# Patient Record
Sex: Female | Born: 1935 | Race: White | Hispanic: No | Marital: Single | Smoking: Former smoker
Health system: Southern US, Community
[De-identification: ages and names within clinical notes are randomized; demographics above are authoritative.]

## PROBLEM LIST (undated history)

## (undated) DIAGNOSIS — K219 Gastro-esophageal reflux disease without esophagitis: Secondary | ICD-10-CM

## (undated) DIAGNOSIS — G459 Transient cerebral ischemic attack, unspecified: Secondary | ICD-10-CM

## (undated) DIAGNOSIS — I10 Essential (primary) hypertension: Secondary | ICD-10-CM

## (undated) DIAGNOSIS — E559 Vitamin D deficiency, unspecified: Secondary | ICD-10-CM

## (undated) DIAGNOSIS — F0280 Dementia in other diseases classified elsewhere without behavioral disturbance: Secondary | ICD-10-CM

## (undated) DIAGNOSIS — K222 Esophageal obstruction: Secondary | ICD-10-CM

## (undated) DIAGNOSIS — M8580 Other specified disorders of bone density and structure, unspecified site: Secondary | ICD-10-CM

## (undated) DIAGNOSIS — J309 Allergic rhinitis, unspecified: Secondary | ICD-10-CM

## (undated) DIAGNOSIS — Z800 Family history of malignant neoplasm of digestive organs: Secondary | ICD-10-CM

## (undated) DIAGNOSIS — E785 Hyperlipidemia, unspecified: Secondary | ICD-10-CM

## (undated) DIAGNOSIS — K449 Diaphragmatic hernia without obstruction or gangrene: Secondary | ICD-10-CM

## (undated) DIAGNOSIS — G309 Alzheimer's disease, unspecified: Secondary | ICD-10-CM

## (undated) DIAGNOSIS — R9389 Abnormal findings on diagnostic imaging of other specified body structures: Secondary | ICD-10-CM

## (undated) DIAGNOSIS — J189 Pneumonia, unspecified organism: Secondary | ICD-10-CM

## (undated) HISTORY — DX: Vitamin D deficiency, unspecified: E55.9

## (undated) HISTORY — DX: Diaphragmatic hernia without obstruction or gangrene: K44.9

## (undated) HISTORY — DX: Other specified disorders of bone density and structure, unspecified site: M85.80

## (undated) HISTORY — DX: Hyperlipidemia, unspecified: E78.5

## (undated) HISTORY — DX: Alzheimer's disease, unspecified: G30.9

## (undated) HISTORY — DX: Abnormal findings on diagnostic imaging of other specified body structures: R93.89

## (undated) HISTORY — DX: Transient cerebral ischemic attack, unspecified: G45.9

## (undated) HISTORY — DX: Allergic rhinitis, unspecified: J30.9

## (undated) HISTORY — DX: Dementia in other diseases classified elsewhere, unspecified severity, without behavioral disturbance, psychotic disturbance, mood disturbance, and anxiety: F02.80

## (undated) HISTORY — DX: Esophageal obstruction: K22.2

## (undated) HISTORY — PX: TONSILLECTOMY: SUR1361

## (undated) HISTORY — PX: THORACOTOMY / DECORTICATION PARIETAL PLEURA: SUR1350

## (undated) HISTORY — DX: Family history of malignant neoplasm of digestive organs: Z80.0

---

## 1978-04-01 HISTORY — PX: ABDOMINAL HYSTERECTOMY: SHX81

## 1997-11-22 ENCOUNTER — Other Ambulatory Visit: Admission: RE | Admit: 1997-11-22 | Discharge: 1997-11-22 | Payer: Self-pay | Admitting: Obstetrics and Gynecology

## 1999-01-30 ENCOUNTER — Other Ambulatory Visit: Admission: RE | Admit: 1999-01-30 | Discharge: 1999-01-30 | Payer: Self-pay | Admitting: Obstetrics and Gynecology

## 1999-03-07 ENCOUNTER — Encounter: Payer: Self-pay | Admitting: Obstetrics and Gynecology

## 1999-03-07 ENCOUNTER — Encounter: Admission: RE | Admit: 1999-03-07 | Discharge: 1999-03-07 | Payer: Self-pay | Admitting: Obstetrics and Gynecology

## 2000-02-04 ENCOUNTER — Other Ambulatory Visit: Admission: RE | Admit: 2000-02-04 | Discharge: 2000-02-04 | Payer: Self-pay | Admitting: Obstetrics and Gynecology

## 2000-03-07 ENCOUNTER — Encounter: Admission: RE | Admit: 2000-03-07 | Discharge: 2000-03-07 | Payer: Self-pay | Admitting: Obstetrics and Gynecology

## 2000-03-07 ENCOUNTER — Encounter: Payer: Self-pay | Admitting: Obstetrics and Gynecology

## 2001-02-09 ENCOUNTER — Other Ambulatory Visit: Admission: RE | Admit: 2001-02-09 | Discharge: 2001-02-09 | Payer: Self-pay | Admitting: Obstetrics and Gynecology

## 2001-02-12 ENCOUNTER — Ambulatory Visit (HOSPITAL_COMMUNITY): Admission: RE | Admit: 2001-02-12 | Discharge: 2001-02-12 | Payer: Self-pay | Admitting: Gastroenterology

## 2001-02-12 ENCOUNTER — Encounter (INDEPENDENT_AMBULATORY_CARE_PROVIDER_SITE_OTHER): Payer: Self-pay | Admitting: Specialist

## 2001-03-12 ENCOUNTER — Encounter: Admission: RE | Admit: 2001-03-12 | Discharge: 2001-03-12 | Payer: Self-pay | Admitting: Obstetrics and Gynecology

## 2001-03-12 ENCOUNTER — Encounter: Payer: Self-pay | Admitting: Geriatric Medicine

## 2001-08-06 ENCOUNTER — Ambulatory Visit (HOSPITAL_COMMUNITY): Admission: RE | Admit: 2001-08-06 | Discharge: 2001-08-06 | Payer: Self-pay | Admitting: Gastroenterology

## 2001-08-06 ENCOUNTER — Encounter (INDEPENDENT_AMBULATORY_CARE_PROVIDER_SITE_OTHER): Payer: Self-pay | Admitting: Specialist

## 2001-10-26 ENCOUNTER — Emergency Department (HOSPITAL_COMMUNITY): Admission: EM | Admit: 2001-10-26 | Discharge: 2001-10-26 | Payer: Self-pay

## 2002-03-15 ENCOUNTER — Encounter: Payer: Self-pay | Admitting: Obstetrics and Gynecology

## 2002-03-15 ENCOUNTER — Encounter: Admission: RE | Admit: 2002-03-15 | Discharge: 2002-03-15 | Payer: Self-pay | Admitting: Obstetrics and Gynecology

## 2002-08-12 ENCOUNTER — Inpatient Hospital Stay (HOSPITAL_COMMUNITY): Admission: AD | Admit: 2002-08-12 | Discharge: 2002-08-23 | Payer: Self-pay | Admitting: Internal Medicine

## 2002-08-13 ENCOUNTER — Encounter: Payer: Self-pay | Admitting: *Deleted

## 2002-08-13 ENCOUNTER — Encounter: Payer: Self-pay | Admitting: Internal Medicine

## 2002-08-14 ENCOUNTER — Encounter: Payer: Self-pay | Admitting: *Deleted

## 2002-08-14 ENCOUNTER — Encounter (INDEPENDENT_AMBULATORY_CARE_PROVIDER_SITE_OTHER): Payer: Self-pay | Admitting: *Deleted

## 2002-08-14 ENCOUNTER — Encounter: Payer: Self-pay | Admitting: Internal Medicine

## 2002-08-15 ENCOUNTER — Encounter: Payer: Self-pay | Admitting: Thoracic Surgery (Cardiothoracic Vascular Surgery)

## 2002-08-15 ENCOUNTER — Encounter (INDEPENDENT_AMBULATORY_CARE_PROVIDER_SITE_OTHER): Payer: Self-pay | Admitting: *Deleted

## 2002-08-16 ENCOUNTER — Encounter (INDEPENDENT_AMBULATORY_CARE_PROVIDER_SITE_OTHER): Payer: Self-pay | Admitting: *Deleted

## 2002-08-16 ENCOUNTER — Encounter: Payer: Self-pay | Admitting: Thoracic Surgery (Cardiothoracic Vascular Surgery)

## 2002-08-17 ENCOUNTER — Encounter: Payer: Self-pay | Admitting: Thoracic Surgery (Cardiothoracic Vascular Surgery)

## 2002-08-18 ENCOUNTER — Encounter: Payer: Self-pay | Admitting: Thoracic Surgery (Cardiothoracic Vascular Surgery)

## 2002-08-19 ENCOUNTER — Encounter: Payer: Self-pay | Admitting: *Deleted

## 2002-08-19 ENCOUNTER — Encounter: Payer: Self-pay | Admitting: Thoracic Surgery (Cardiothoracic Vascular Surgery)

## 2002-08-20 ENCOUNTER — Encounter: Payer: Self-pay | Admitting: *Deleted

## 2002-09-13 ENCOUNTER — Encounter: Payer: Self-pay | Admitting: Thoracic Surgery (Cardiothoracic Vascular Surgery)

## 2002-09-13 ENCOUNTER — Encounter
Admission: RE | Admit: 2002-09-13 | Discharge: 2002-09-13 | Payer: Self-pay | Admitting: Thoracic Surgery (Cardiothoracic Vascular Surgery)

## 2002-11-25 ENCOUNTER — Ambulatory Visit (HOSPITAL_COMMUNITY): Admission: RE | Admit: 2002-11-25 | Discharge: 2002-11-25 | Payer: Self-pay | Admitting: Gastroenterology

## 2002-12-13 ENCOUNTER — Encounter
Admission: RE | Admit: 2002-12-13 | Discharge: 2002-12-13 | Payer: Self-pay | Admitting: Thoracic Surgery (Cardiothoracic Vascular Surgery)

## 2002-12-13 ENCOUNTER — Encounter: Payer: Self-pay | Admitting: Thoracic Surgery (Cardiothoracic Vascular Surgery)

## 2003-03-17 ENCOUNTER — Encounter: Admission: RE | Admit: 2003-03-17 | Discharge: 2003-03-17 | Payer: Self-pay | Admitting: Geriatric Medicine

## 2004-07-05 ENCOUNTER — Encounter: Admission: RE | Admit: 2004-07-05 | Discharge: 2004-07-05 | Payer: Self-pay | Admitting: Geriatric Medicine

## 2004-09-05 ENCOUNTER — Ambulatory Visit (HOSPITAL_COMMUNITY): Admission: RE | Admit: 2004-09-05 | Discharge: 2004-09-05 | Payer: Self-pay | Admitting: Gastroenterology

## 2005-07-10 ENCOUNTER — Encounter: Admission: RE | Admit: 2005-07-10 | Discharge: 2005-07-10 | Payer: Self-pay | Admitting: Geriatric Medicine

## 2006-07-08 ENCOUNTER — Encounter: Admission: RE | Admit: 2006-07-08 | Discharge: 2006-07-08 | Payer: Self-pay | Admitting: Allergy and Immunology

## 2006-07-18 ENCOUNTER — Encounter: Admission: RE | Admit: 2006-07-18 | Discharge: 2006-07-18 | Payer: Self-pay | Admitting: Geriatric Medicine

## 2007-07-20 ENCOUNTER — Encounter: Admission: RE | Admit: 2007-07-20 | Discharge: 2007-07-20 | Payer: Self-pay | Admitting: Geriatric Medicine

## 2007-07-24 ENCOUNTER — Emergency Department (HOSPITAL_COMMUNITY): Admission: EM | Admit: 2007-07-24 | Discharge: 2007-07-24 | Payer: Self-pay | Admitting: Emergency Medicine

## 2008-07-20 ENCOUNTER — Encounter: Admission: RE | Admit: 2008-07-20 | Discharge: 2008-07-20 | Payer: Self-pay | Admitting: Geriatric Medicine

## 2008-08-23 ENCOUNTER — Encounter: Admission: RE | Admit: 2008-08-23 | Discharge: 2008-08-23 | Payer: Self-pay | Admitting: Allergy and Immunology

## 2009-07-21 ENCOUNTER — Encounter: Admission: RE | Admit: 2009-07-21 | Discharge: 2009-07-21 | Payer: Self-pay | Admitting: Geriatric Medicine

## 2009-11-11 ENCOUNTER — Emergency Department (HOSPITAL_COMMUNITY): Admission: EM | Admit: 2009-11-11 | Discharge: 2009-11-11 | Payer: Self-pay | Admitting: Emergency Medicine

## 2009-11-30 DIAGNOSIS — R9389 Abnormal findings on diagnostic imaging of other specified body structures: Secondary | ICD-10-CM

## 2009-11-30 HISTORY — DX: Abnormal findings on diagnostic imaging of other specified body structures: R93.89

## 2009-12-06 ENCOUNTER — Encounter: Admission: RE | Admit: 2009-12-06 | Discharge: 2009-12-06 | Payer: Self-pay | Admitting: Geriatric Medicine

## 2010-07-05 ENCOUNTER — Other Ambulatory Visit: Payer: Self-pay | Admitting: Geriatric Medicine

## 2010-07-05 DIAGNOSIS — Z1231 Encounter for screening mammogram for malignant neoplasm of breast: Secondary | ICD-10-CM

## 2010-07-23 ENCOUNTER — Ambulatory Visit
Admission: RE | Admit: 2010-07-23 | Discharge: 2010-07-23 | Disposition: A | Discharge status: Home / Self Care | Payer: Medicare Other | Source: Ambulatory Visit | Attending: Geriatric Medicine | Admitting: Geriatric Medicine

## 2010-07-23 DIAGNOSIS — Z1231 Encounter for screening mammogram for malignant neoplasm of breast: Secondary | ICD-10-CM

## 2010-08-17 NOTE — Op Note (Signed)
NAME:  Grace Gilbert, Grace Gilbert                         ACCOUNT NO.:  [id number]   MEDICAL RECORD NO.:  [id number]                   PATIENT TYPE:  INP   LOCATION:  2550                                 FACILITY:  [facility]   PHYSICIAN:  Salvatore Decent. [name], M.D.              DATE OF BIRTH:  11-16-1932   DATE OF PROCEDURE:  08/15/2002  DATE OF DISCHARGE:                                 OPERATIVE REPORT   PREOPERATIVE DIAGNOSIS:  Subacute or chronic empyema.   POSTOPERATIVE DIAGNOSIS:  Subacute or chronic empyema.   PROCEDURE:  1. Flexible bronchoscopy.  2. Right thoracotomy for decortication and drainage of empyema.   SURGEON:  Salvatore Decent. [name], M.D.   ASSISTANT:  Eber Hong, P.A.   ANESTHESIA:  General.   BRIEF CLINICAL NOTE:  The patient is a 77 year old female who presents with  a prolonged history of recurrent respiratory infections and fevers.  Chest x-  ray and chest CT scan demonstrate findings consistent with subacute or  chronic empyema.  The patient remains febrile with elevated white blood  count, but is clinically stable.  A full consultation note has been dictated  previously.  Gastrografin and barium swallow demonstrate no sign of  esophageal leak, although there is evidence of likely benign lower  esophageal stricture.   OPERATIVE CONSENT:  The patient and her cousin have been counseled at length  regarding the indications and need for surgical intervention for definitive  drainage and treatment of her empyema.  Alternative treatment strategies  have been discussed.  The patient understands and accepts all associated  risks of surgery including, but not limited to, risks of death, stroke,  myocardial infarction, acute respiratory failure, pneumonia, bleeding  requiring blood transfusion, arrhythmia, prolonged air leak requiring chest  tube drainage, recurrent effusion.  All of their questions have been  addressed.   OPERATIVE NOTE IN DETAIL:  The patient is  brought to the operating room on  the above-mentioned date and placed in a supine position on the operating  room table.  A central venous catheter and radial arterial line are placed  by the anesthesia service under the care and direction of Dr. Kaylyn Layer.  Ossey.  The patient has already been receiving intravenous antibiotics and  her recent dose administration is verified.  General endotracheal anesthesia  is induced uneventfully using a single-lumen endotracheal tube.  A Foley  catheter is placed.  Pneumatic compression boots are placed on both lower  extremities to prevent deep vein thrombosis.   Flexible bronchoscopy is performed through the existing endobronchial tube.  The distal trachea, carina and left and right endobronchial trees are  visualized carefully using a 5-mm flexible bronchoscope.  There are copious  purulent secretions throughout all the airways.  The airways are  erythematous and swollen.  There is normal endobronchial anatomy.  No  endobronchial lesions or obstructions or masses are identified.  The  segmental bronchi  in the right lower lobe, right middle lobe and the right  upper lobe all appear somewhat collapsed, but there is no significant of  extrinsic mass compression or obstruction.  The airways are irrigated with  saline solution and bronchial washings are sent for culture and sensitivity.  Additional samples of bronchial washings are sent for cytology.  The  bronchoscope is removed uneventfully.   The patient is reintubated using a dual-lumen endotracheal tube by Dr.  Michelle Piper.  This was apparently technically difficult and required several  tries, although eventually a tube was positioned appropriately and the  patient tolerated it well.  The patient is turned to the left lateral  decubitus position and single-lung ventilation is begun.  The patient is  positioned using a pneumatic beanbag device and axillary roll.  A warm air  conduction blanket is placed  over the lower body to maintain body  temperature.  The patient's right chest is prepared and draped in a sterile  manner.   A small incision is made over the anterior axillary line overlying  approximately the 8th intercostal space.  The incision is completed through  the subcutaneous tissues and intercostal musculature with electrocautery.  The right pleural space is entered bluntly.  One can appreciate some murky  fluid as well as dense consolidation of the underlying lung and adhesions  and fibrous tissues surrounding the lung, therefore making video  thoracoscopic surgery impossible, as the lung will not collapse.   A right posterolateral thoracotomy incision is made.  The latissimus dorsi  muscle is divided with electrocautery.  The serratus anterior muscle is  preserved.  The right chest is entered through the sixth intercostal space.  There is evidence of subacute empyema with murky purulent fluid which is  aspirated and sent for culture and sensitivity.  The surface of the lung is  fairly easily mobilized from the chest wall circumferentially, as, although  there is dense subacute inflammation, the adhesions between the lung and  chest wall are not fibrotic.  The lung is mobilized circumferentially.  The  majority of the lung is atelectatic and severely consolidated.  There are  dense adhesions between the base of the right lower lobe and the dome of the  diaphragm which are transected using sharp dissection and electrocautery.  Pleural peel is debrided from the visceropleural surface over the entire  lung and specimens are sent to pathology for routine pathology.  There is an  area in the middle portion of the right lung which appears to be in the  right lower lobe that has appearance of a lung abscess which is perforated  to the pleural surface.  This could represent an area of abscess within the major fissure, although the major fissure is not well-developed at all in  this  region.  Swab cultures are obtained from this abscess cavity for  aerobic and anaerobic culture.  After her lung is completely mobilized and  all of the visceropleural fibrosis has been debrided, the right chest is  irrigated with copious warm saline solution containing multiple antibiotics.  The esophagus is carefully inspected and palpated and no masses are  appreciated.  There are no palpable masses within the lung parenchyma,  although the lung parenchyma is densely consolidated, making it difficult to  ascertain and definitively rule out the possibility of an underlying mass.  Satisfactory hemostasis is ascertained.  The right chest is drained using  three separate 36-French chest tube exited through separate stab incisions  inferiorly to  completely drain the pleural space.  The thoracotomy incision  is closed in multiple layers in routine fashion.  Skin incision is closed  with skin staples.  The chest tubes are affixed to closed suction drainage  device.   The patient tolerated the procedure well, was reintubated using a single-  lumen tube, and transported to the surgical intensive care unit in stable  condition.  There were no intraoperative complications.  All sponge,  instrument and needle counts are verified correct at completion of the  operation.  Estimated blood loss from the procedure is less than 100 mL.                                               Salvatore Decent. [name], M.D.    CHO/MEDQ  D:  08/15/2002  T:  08/16/2002  Job:  [id number]   cc:   Hal T. Stoneking, M.D.  [address]  [place], [place] [address]  Fax: [phone]   Ermalene Searing. Leander Rams, M.D.  [address], [place] [address]  Fax: [phone]   Candyce Churn, M.D.  [address] [place]  [place] [address]  Fax: [phone]

## 2010-08-17 NOTE — Op Note (Signed)
NAMESILVER, ACHEY NO.:  [id number]   MEDICAL RECORD NO.:  [id number]          PATIENT TYPE:  AMB   LOCATION:  ENDO                         FACILITY:  [facility]   PHYSICIAN:  Danise Edge, M.D.   DATE OF BIRTH:  25-Mar-1933   DATE OF PROCEDURE:  09/05/2004  DATE OF DISCHARGE:                                 OPERATIVE REPORT   PROCEDURE:  Surveillance colonoscopy   PROCEDURE INDICATIONS:  Ms. Grace Gilbert is a 77 year old female born  09-03-1932.  Three years ago she underwent her first screening  colonoscopy which resulted in the removal of a 1.5 cm tubulovillous adenoma  of the rectum. She is scheduled for surveillance colonoscopy with  polypectomy to prevent colon cancer.   ENDOSCOPIST:  Danise Edge, M.D.   PREMEDICATION:  Versed 5 milligrams, Demerol 50 milligrams.   PROCEDURE:  After obtaining informed consent, [name] was placed in the  left lateral decubitus position. I administered intravenous Demerol and  intravenous Versed to achieve conscious sedation for the procedure. The  patient's blood pressure, oxygen saturation and cardiac rhythm were  monitored throughout the procedure and documented in the medical record.   Anal inspection and digital rectal exam were normal. The Olympus adjustable  pediatric colonoscope was introduced into the rectum and easily advanced to  the cecum. Colonic preparation for the exam today was excellent.   Rectum normal. Retroflexed view of the distal rectum normal.  Sigmoid colon and descending colon normal.  Splenic flexure normal.  Transverse colon normal.  Hepatic flexure normal.  Ascending colon normal.  Cecum and ileocecal valve normal.   ASSESSMENT:  Normal proctocolonoscopy to the cecum.   RECOMMENDATIONS:  Repeat colonoscopy in June 2011.      MJ/MEDQ  D:  09/05/2004  T:  09/05/2004  Job:  [id number]   cc:   Hal T. Stoneking, M.D.  [address] [place], [place] [address]  Fax:  [phone]

## 2010-08-17 NOTE — Op Note (Signed)
NAME:  Grace Gilbert, Grace Gilbert                         ACCOUNT NO.:  [id number]   MEDICAL RECORD NO.:  [id number]                   PATIENT TYPE:  AMB   LOCATION:  ENDO                                 FACILITY:  [facility]   PHYSICIAN:  Danise Edge, M.D.                DATE OF BIRTH:  [birth year]   DATE OF PROCEDURE:  11/25/2002  DATE OF DISCHARGE:                                 OPERATIVE REPORT   PROCEDURE:  Esophagogastroduodenoscopy with Savary esophageal dilation.   PROCEDURE INDICATIONS:  [name] is a 77 year old female born  07-08-1932.  [name] underwent a barium esophagogram Aug 14, 2002,  which revealed a smoothly-tapering stricture in the distal esophagus  associated with a small hiatal hernia.   ENDOSCOPIST:  Danise Edge, M.D.   PREMEDICATION:  Versed 7.5 mg, Demerol 50 mg.   DESCRIPTION OF PROCEDURE:  After obtaining informed consent, [name] was  placed in the left lateral decubitus position.  I administered intravenous  Demerol and intravenous Versed to achieve conscious sedation for the  procedure.  The patient's blood pressure, oxygen saturation, and cardiac  rhythm were monitored throughout the procedure and documented in the medical  record.   The Olympus gastroscope was passed through the posterior hypopharynx into  the proximal esophagus without difficulty.  The hypopharynx and larynx  appeared normal.  I did not visualize the vocal cords.   Esophagoscopy:  The proximal, mid-, and lower segments of the esophageal  mucosa appear completely normal.  I did not detect mucosal scarring or an  esophageal stricture.  The lower esophageal sphincter poorly relaxes, which  is probably the source of the stricture by barium esophagogram.   Gastroscopy:  Retroflexed view of the gastric cardia and fundus was normal.  The gastric body, antrum, and pylorus appear normal.   Duodenoscopy:  The duodenal bulb, mid-duodenum, and distal duodenum appear  normal.   Savary esophageal dilation:  The Savary dilator wire was passed through the  endoscope and the tip of the guidewire advanced to the distal gastric  antrum, as confirmed endoscopically.  The 15 mm Savary dilator passed with  minimal resistance.  Repeat esophagogastroscopy again revealed normal  esophageal mucosa without stricture dilation and no gastric trauma due to  the guidewire.   ASSESSMENT:  Except for a poorly-relaxing lower esophageal sphincter, Ms.  Neitzke's esophagogastroduodenoscopy was normal.  The 15 mm Savary dilator  was passed without endoscopic evidence of stricture dilation.  [name]  may have early achalasia.    RECOMMENDATIONS:  If [name]'s dysphagia persists, I would recommend an  esophageal manometry looking for signs of achalasia.                                               Danise Edge,  M.D.    MJ/MEDQ  D:  11/25/2002  T:  11/25/2002  Job:  [id number]   cc:   Hal T. Stoneking, M.D.  [address] [place], [place] [address]  Fax: [phone]

## 2010-08-17 NOTE — Procedures (Signed)
[place]  Patient:    Grace Gilbert, Grace Gilbert Visit Number: [id number] MRN: [id number]          Service Type: END Location: ENDO Attending Physician:  Dennison Bulla Ii Dictated by:   Verlin Grills, M.D. Proc. Date: 08/06/01 Admit Date:  08/06/2001 Discharge Date: 08/06/2001   CC:         Hal T. Stoneking, M.D.   Procedure Report  REFERRING PHYSICIAN:  Hal T. Stoneking, M.D.  PROCEDURES:  Flexible protosigmoidoscopy, cold snare rectal polypectomy, and argon plasma coagulation of the rectal polypectomy site.  PROCEDURE INDICATION:  Ms. Grace Gilbert is a 77 year old female born 1933/01/02.  On February 12, 2001, Grace Gilbert underwent a screening colonoscopy; a 1.5 cm sessile tubulovillous adenoma was removed from the midrectum with the electrocautery snare after saline lifting of the polyp. Grace Gilbert is seen today to inspect the rectal polypectomy site.  ENDOSCOPIST:  Verlin Grills, M.D.  PREMEDICATION:  None.  DESCRIPTION OF PROCEDURE:  Grace Gilbert received a Fleets enema prep.  She was placed in the left lateral decubitus position.  Anal inspection was normal. Digital rectal examination was normal.  Flexible proctosigmoidoscopy was carried out to 25 cm.  In the midrectum there is a 2 mm sessile polyp at the previous polypectomy site.  The polyp was removed with the cold snare and submitted for pathologic interpretation.  The polypectomy site was coagulated using the argon plasma coagulator.  PLAN:  I will repeat a proctoscopic exam in the office in approximately six weeks to determine if residual neoplastic tissue remains at the polypectomy site. Dictated by:   Verlin Grills, M.D. Attending Physician:  Dennison Bulla Ii DD:  08/06/01 TD:  08/08/01 Job: [id number]

## 2010-08-17 NOTE — Discharge Summary (Signed)
NAME:  Grace Gilbert, Grace Gilbert                         ACCOUNT NO.:  [id number]   MEDICAL RECORD NO.:  [id number]                   PATIENT TYPE:  INP   LOCATION:  3301                                 FACILITY:  [facility]   PHYSICIAN:  Salvatore Decent. [name], M.D.              DATE OF BIRTH:  [birth year]   DATE OF ADMISSION:  08/12/2002  DATE OF DISCHARGE:  08/23/2002                                 DISCHARGE SUMMARY   ADMISSION DIAGNOSIS:  Right lower lobe lung infiltrate and large right  pleural effusion.   PAST MEDICAL HISTORY:  1. Hypertension.  2. Diabetes mellitus type 2, currently diet controlled.  3. Allergic rhinitis.  4. Status post removal of tubulovillous adenoma via colonoscopy July 2002 by     Dr. Danise Edge.   PAST SURGICAL HISTORY:  Total abdominal hysterectomy.   ALLERGIES:  No known drug allergies   DISCHARGE DIAGNOSES:  Subacute/chronic empyema, status post right  thoracotomy, decortication, and drainage.   BRIEF HISTORY:  The patient is a 77 year old Caucasian female.  She reported  several respiratory infections over the past several months prior to  admission, originally treated by Dr. Percival Spanish.  She saw Dr. Ann Maki T.  Stoneking two days prior to admission. He obtained a chest x-ray which  revealed a large pin-sized right lower lobe infiltrate.  She was started on  Tequin and Tussionex; however, her symptoms worsened over the next 48 hours.  She presented to [name] on Aug 12, 2002 with quite severe right pleuritic  chest pain.  A repeat chest x-ray revealed a large right pleural effusion  and what he felt was probable hilar adenopathy.   HOSPITAL COURSE:  On Aug 12, 2002, the patient was admitted was to [facility] under the care of [name].  She was started on IV  antibiotics and a chest CT scan was obtained.  The CT revealed findings  consistent with right-sided empyema.  She then underwent an ultrasound-  guided needle thoracentesis with a  total of 60 mL of grossly purulent fluid  aspirated.  Because of these findings, thoracic surgical consultation was  requested.   She was evaluated by Dr. Salvatore Decent. [name] on Aug 14, 2002.  After  examination and review of the available records including the most recent CT  scan, Dr. Salvatore Decent. Owen's impression was that of subacute versus chronic  empyema with possible right lower lobe abscess.  He also noted on her CT  scan findings notable for permanently thickened lower esophagus.  He did  have some concerns of a primary esophageal perforation as the culprit for  her infectious process.  Gastrografin and barium swallow studies were done  and demonstrated no sign of esophageal leak.  Although __________, Dr. [name]  recommended proceeding with bronchoscopy followed by thoracotomy for  definitive drainage of right empyema with possible decortication.  Procedure  risks and benefits were  discussed with the patient and she agreed with this  plan.   On Aug 15, 2002, the patient was underwent surgical procedure with Dr.  Purcell Nails.  Fiberoptic bronchoscopy, right thoracotomy and  decortication of her right lung.  Findings at the time of surgery were  consistent with subacute empyema with small lung abscess and interlobar  abscess.  She tolerated the procedure well and transferred in stable  condition to the PACU.  She remained hemodynamically stable in the immediate  postoperative period and was extubated several hours after arrival in the  intensive care unit.  She awoke from anesthesia neurologically intact.   On Aug 16, 2002, Infectious Disease consultation was requested for  antibiotic choice and duration. She was seen by Dr. Rockey Situ. Roxan Hockey who  felt that Zosyn was excellent empiric therapy pending final culture results.   On Aug 18, 2002, cultures returned as microphylla strep.  Dr. Cliffton Asters  advised to continue with Zosyn for this.   The patient's postoperative  course was essentially uneventful.  She did  develop some mild depression which cleared several days after surgery.  She  did require quite aggressive pulmonary toilet to improve her respiratory  status.  Chest tube was placed in place several days to allow adequate  drainage of her pleural chest cavity.  Her chest tube was removed on  postoperative day four.   On Aug 19, 2002, final culture report included lung and pleural fluid  cultures revealed microaerophilic strep.  Dr. Cliffton Asters felt it was safe  to narrow the antibiotic therapy to Unasyn.  He recommended a total of three  weeks of therapy postoperatively but she could be converted to oral  Augmentin on discharge.   The morning of Aug 22, 2002, postoperative day six, the patient reports  feeling very well.  Her vital signs are stable with blood pressure of  130/45.  She is afebrile.  The room air saturation is 96%.  Her breathing is  nonlabored.  She does have some mild exertional pain and right-sided  discomfort.  Her breath sounds remained slightly decreased on the right.  There were no wheezes or rhonchi present.  She is eating well.  Her  ambulation is improving.  Her overall spirits are much improved.  The  patient is making very good progress in recovering from her surgery.  It is  anticipated that she will be ready for discharge home tomorrow, Aug 23, 2002.   LABORATORY DATA:  Aug 20, 2002, CBC showed white blood cell 17.8, hemoglobin  9.3, hematocrit 28.6, platelets 631,000.  Chemistries included a sodium of  136, potassium 3.8, BUN 5, creatinine 0.6, glucose 136.   Aug 20, 2002, she did develop some discreet oral ulcers.  Dr. Cliffton Asters  felt this was probably an out take of oral agents.  She was started on a  course of Valtrex.   CONDITION ON DISCHARGE:  Improved.   DISCHARGE MEDICATIONS:  1. Augmentin 875 mg 1 p.o. b.i.d. x 10 more days. 2. Valtrex 500 mg 1 p.o. b.i.d. x 3 days.  3. Ultram 50 mg 1-2 p.o.  q.6h. p.r.n. pain.  4. Resume Atacand 8 mg p.o. daily.  5. Resume Zantac 75 mg p.o. daily.  6. Resume Nasarel 1 puff each nostril daily.  7. Resume multivitamin daily.  8. Resume calcium daily.  9. Resume Premarin daily.  10.      For pain, she may have Ultram every six hours for moderate to  severe pain or Tylenol 325 mg 1-2 p.o. q.4-6h. p.r.n. mild pain.   ACTIVITY:  Ask to refrain from any driving or heavy lifting.  She is also  instructed to continue her breathing exercises and daily walking.   DIET:  She will continue to eat carbohydrates modified for diabetes.   WOUND CARE:  She may shower.  If her incision is red, hot, swollen,  draining, or temperature greater than 101, she will call Dr. Deliah Boston office.   FOLLOW UP:  Dr. Salvatore Decent. [name] would like to see in CV[facility] office in  approximately two weeks.  The office will call to schedule that appointment.  She will be asked to have a chest x-ray at [facility]  before that appointment.     Toribio Harbour, N.P.                  Salvatore Decent. [name], M.D.    CTK/MEDQ  D:  08/22/2002  T:  08/22/2002  Job:  [id number]   cc:   Patient's chart   Salvatore Decent. [name], M.D.  [address]  [place]  [place] [address]  Fax: [phone]   Candyce Churn, M.D.  [address] [place]  [place] [address]  Fax: [phone]   Hal T. Stoneking, M.D.  [address] [place], [place] [address]  Fax: [phone]

## 2010-08-17 NOTE — Procedures (Signed)
. [facility]  Patient:    Grace Gilbert, Grace Gilbert Visit Number: [id number] MRN: [id number]          Service Type: Attending:  Verlin Grills, M.D. Dictated by:   Verlin Grills, M.D. Proc. Date: 02/12/01   CC:         Hal T. Stoneking, M.D.                           Procedure Report  DATE OF BIRTH:  1932-12-29  REFERRING PHYSICIAN:  Hal T. Stoneking, M.D.  PROCEDURE PERFORMED:  Colonoscopy.  ENDOSCOPIST:  Verlin Grills, M.D.  INDICATIONS FOR PROCEDURE:  The patient is a 77 year old female who is due for her first surveillance colonoscopy with polypectomy to prevent colon cancer.  I discussed with the patient the complications associated with colonoscopy and polypectomy including a 15 per 1000  risk of bleeding and 4 per 1000 risk of colon rupture requiring emergency surgery.  The patient has signed the operative permit.  PREMEDICATION:  Versed 7.5 mg, Demerol 50 mg.  ENDOSCOPE:  Olympus pediatric video colonoscope.  DESCRIPTION OF PROCEDURE:  After obtaining informed consent, the patient was placed in the left lateral decubitus position.  I administered intravenous Demerol and intravenous Versed to achieve conscious sedation for the procedure.  The patients blood pressure, oxygen saturation and cardiac rhythm were monitored throughout the procedure and documented in the medical record.  Anal inspection was normal.  Digital rectal exam was normal.  The Olympus pediatric video colonoscope was then introduced into the rectum easily advanced to the cecum.  Colonic preparation for the exam today was excellent.  Rectum:  From the midrectum, there was a 1.5 cm sessile polyp.  The polyp was lifted with saline and removed with the electrocautery snare.  Sigmoid colon and descending colon:  Normal.  Splenic flexure:  Normal.  Transverse colon:  Normal.  Hepatic flexure:  Normal.  Ascending colon:  Normal.  Cecum and  ileocecal valve:  Normal.  ASSESSMENT:  From the midrectum, a 1.5 cm polyp was removed with the electrocautery snare in piecemeal fashion following saline lifting.  RECOMMENDATIONS:  Further therapy will be based on the pathology of the polyp. Dictated by:   Verlin Grills, M.D. Attending:  Verlin Grills, M.D. DD:  02/12/01 TD:  02/12/01 Job: [id number]

## 2010-08-17 NOTE — H&P (Signed)
Grace Gilbert, Grace Gilbert NO.:  [id number]   MEDICAL RECORD NO.:  [id number]                   PATIENT TYPE:  INP   LOCATION:  3037                                 FACILITY:  [facility]   PHYSICIAN:  Candyce Churn, M.D.          DATE OF BIRTH:  [birth year]   DATE OF ADMISSION:  08/12/2002  DATE OF DISCHARGE:                                HISTORY & PHYSICAL   CHIEF COMPLAINT:  Right pleuritic chest pain.   HISTORY OF PRESENT ILLNESS:  Grace Gilbert is a 77 year old female with a  history of:  1. Hypertension.  2. Removal of tubulovillous adenoma on her colonoscopy of 7/02-Dr. Danise Edge.  3. Diet controlled type 2 diabetes mellitus.  4. Allergic rhinitis.  5. History of total abdominal hysterectomy.   She presents with complaints of shortness of breath and quite severe right  pleuritic chest pain. She reports several respiratory infections over the  past several months, apparently treated by Dr. [place] Callas. She saw Dr. Merlene Laughter two days ago, and large tennis-ball-sized right lower lobe  infiltrate was noted. It was round in appearance and lateral on PA chest x-  ray. She was started on Tequin and Tussionex in addition to her usual  medications, and she has worsened over the last 48 hours. She now has quite  severe right pleuritic chest pain. Repeat chest x-ray shows a very large  right pleural effusion and probable hilar adenopathy. Concern for pneumonia  with empyema. Also could be an underlying malignancy.   MEDICATIONS:  1. Mucinex 600 mg b.i.d.  2. Zantac 75 mg daily.  3. Atacand 8 mg daily.  4. Premarin 0.625 mg daily.  5. Centrum vitamin one daily.  6. Os-Cal 500 mg two p.o. daily.  7. Nasarel one puff each nostril each daily.  8. Advair Diskus 100/50 one inhalation b.i.d.  9. Maxair inhaler anywhere from 0 to 3 puffs daily.  10.      Singulair 5 mg p.o. q.h.s.  11.      Allegra 30 mg daily.  12.      Multiple aspirin since 5/8  secondary to right pleuritic chest pain.  13.      Nystatin one teaspoon orally, apparently for two days on 5/9-10.  14.      Hydrocodone with cough syrup half teaspoon b.i.d. on 5/10.  15.      Bufferin therapy recently question dose.  16.      Tequin 400 mg daily starting on 5/11.  17.      Tussionex one teaspoon at night started on 5/11.   FAMILY HISTORY:  Noncontributory.   VACCINATIONS:  The patient has had flu and Pneumovax on a routine basis, and  these are up to date.   SOCIAL HISTORY:  The patient is single, is a retired Runner, broadcasting/film/video. She lives  alone. She smoked in the distant past and stopped 32  years ago. She has a  relative, Grace Gilbert, who can be reached at [phone]. He was present with  her today. She does not drink alcohol.   REVIEW OF SYSTEMS:  Apparently lost 30 pounds over the past four to five  minutes. This apparently was intentional. Says her appetite is good. She has  right pleuritic chest pain. No change in urinary or bowel habits. She  noticed a pinkish-tinged discharge vaginally that she reports this evening  but not reported earlier. She has taken excessive amounts of aspirin over  the last several days because of her right pleuritic chest pain.   PHYSICAL EXAMINATION:  VITAL SIGNS:  Temperature is 100.7, pulse is 88 and  regular, blood pressure 130/70, weight is 144. O2 saturation is 80% on room  air.  HEENT:  Benign. Oropharynx is clear.  NECK:  Supple without JVD.  CHEST:  Has decreased breath sounds on the right except the apex. Left lung  clear.  CARDIAC EXAM:  Regular rhythm. No murmur, rub, or gallop.  ABDOMEN:  Soft, nontender.  EXTREMITIES:  Without clubbing, cyanosis, or edema.   Sodium is 129, potassium 4.0, chloride 92, bicarb 27, BUN 6, creatinine 0.7,  blood sugar 247. White blood cell count is 35,600. She has 92% polys,  hemoglobin is 12, platelet count is 630,000. Chest x-ray reveals very large  right pleural effusion with probable right  hilar adenopathy.   ASSESSMENT:  Rapid progression of right lower lobe infiltrate with a very  large right pleural effusion accumulated in the last two days on oral  Tequin. This is almost certainly a pneumonia. It is possible that she could  have a neoplasm present as well. Could easily have an empyema.   PLAN:  1. Chest CT with contrast.  2. Thoracentesis for diagnosis.  3. Treat with IV Tequin and nasal cannula O2.  4. Transfer to ICU if difficult to oxygenate on low flow O2.  5. Consider pulmonary critical care consult.  6. Use sliding scale Humulog for hyperglycemia.  7. Monitor hypernatremia. I do not think that she has congestive heart     failure and will check a BNP. I think she should be well hydrated for     this probable pneumonia.  8.     Use IV Toradol for pain and monitor renal function.  9. Will continue bronchodilator therapy.  10.      Continue antihypertensive therapy with Avapro and GI prophylaxis     with ranitidine.                                               Candyce Churn, M.D.    RNG/MEDQ  D:  08/12/2002  T:  08/13/2002  Job:  [id number]   cc:   Hal T. Stoneking, M.D.  [address]  [place], [place] [address]  Fax: [phone]   Ermalene Searing. Leander Rams, M.D.  [address] [place] [address]  Fax: [phone]

## 2010-08-17 NOTE — Consult Note (Signed)
Grace Gilbert, Grace Gilbert                         ACCOUNT NO.:  [id number]   MEDICAL RECORD NO.:  [id number]                   PATIENT TYPE:  INP   LOCATION:  3037                                 FACILITY:  [facility]   PHYSICIAN:  Salvatore Decent. [name], M.D.              DATE OF BIRTH:  [birth year]   DATE OF CONSULTATION:  08/14/2002  DATE OF DISCHARGE:                                   CONSULTATION   PRIMARY CARE PHYSICIAN:  [name].   REASON FOR CONSULTATION:  Right empyema.   HISTORY OF PRESENT ILLNESS:  The patient is a 77 year old white female who  presents with what she describes as a one-year-long history of progressive  and recurrent symptoms of low-grade fevers, productive cough, recurrent  pneumonia, and more recent development of right-sided pleuritic chest pain.  She reports over numerous months, she has been treated for respiratory tract  infections with courses of oral antibiotics and steroids.  This has not  resolved any of her symptoms, although she states that she typically feels  better transiently and then gets worse again.  She has developed progressive  exertional shortness of breath and fatigue.   Approximately one week ago, she began to develop right-sided chest pain  which was described as sharp pain radiating around the right back and right  shoulder that is exacerbated by deep bouts of coughing.  The pain become  quite severe, prompting her to present to [name] who performed  a chest x-ray as an outpatient two days prior to admission.  She was noted  to have a right lower lobe infiltrate, and she was started on oral Tequin  and Tussionex.  Her condition deteriorated over the ensuing 48 hours,  prompting her to present to [name] who repeated the chest x-ray on May  13.  This revealed worsening of the right lower lobe infiltrate with right  pleural effusion.  She was promptly admitted to the hospital, started on  intravenous antibiotics,  and a chest CT scan was ordered.  A chest CT scan  was performed yesterday, and notable for findings consistent with right-  sided empyema.  The patient underwent ultrasound-guided needle thoracentesis  late this afternoon.  A total of 60 cc of grossly purulent fluid was  aspirated.  Thoracic surgical consultation was then called.   REVIEW OF SYSTEMS:  General:  The patient states she has felt fatigued.  She  has lost approximately 30 pounds over the last two years, but she states  this is intentional with diet and exercise program.  She remains remarkably  active and vigorous despite her chronic respiratory difficulties, and she  states that up until recently, she was still exercising and ambulating on a  regular basis.  She reports that her appetite remains good even at present.  Respiratory:  Notable for progressive exertional shortness of breath as well  as prolonged productive  cough which is productive occasionally of greenish,  yellow sputum, brownish sputum, and occasionally pink-tinged sputum. The  patient denies frank hemoptysis.  She denied wheezing.  She denies a history  of exposure to patient's with active tuberculosis or other unusual  respiratory pathogens.  Cardiac:  Notable for the absence of any exertional  chest discomfort.  Worrisome for angina.  The patient denies any history of  palpitations or syncope.  She has occasional mild bilateral lower-extremity  edema.  She denies a history of orthopnea.  Gastrointestinal:  Notable for  the absence of any history of hematochezia, hematemesis or melena.  The  patient does report that she occasionally has difficulty swallowing with  some tendency of solid foods to get partially stuck two-thirds of the way  down.  She reports this has been going on for at least 3-4 years.  She has  never had problems with food getting completely stuck, and she does not  regurgitate her food.  It does not hurt to swallow.  She denies any known   history of esophageal disorder, although she has never been evaluated for  this specifically.  Musculoskeletal:  Notable for mild arthritis which she  states exists primarily in the right upper back.  Neurological:  Negative.  The patient denies symptoms of transient monocular blindness or transient  numbness or weakness involving either upper or lower extremity.  She denies  a history of seizure disorder.  Infectious:  Notable for what the patient  describes as a longstanding problem with low-grade fevers.  She has been  febrile this hospitalization with T-max of 101.8 recorded today.  The  patient denies dysuria or hematuria, or urinary urgency or frequency.  Psychiatric:  Negative.  Endocrine:  Notable for a history of borderline  type 2 diabetes mellitus.  The patient reports that her blood sugars have  been well-controlled with diet alone.  She had a recent hemoglobin A1c  measured at [name] office which was normal.   PAST MEDICAL HISTORY:  Notable for a history of hypertension, diet-  controlled type 2 diabetes mellitus, and allergic rhinitis.  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 [name] in July of 2002.  The patient denies any known history of  coronary artery disease, congestive heart failure or previous stroke.  The  patient denies any known history of peptic ulcer disease or GE reflux  disease.   PAST SURGICAL HISTORY:  Notable for total abdominal hysterectomy in the  distant past.   SOCIAL HISTORY:  The patient is single and has never been married, and lives  alone.  She has a first cousin who is supportive and lives nearby, and an  uncle who lives in [place], [place].  She has a remote history of tobacco use  although she quite smoking more than 30 years ago.  She denies history of  excessive alcohol consumption.  She is a retired Engineer, site, but remains quite active physically.   MEDICATIONS:   Prior to admission are listed in her chart, and notable for  Mucinex, Zantac, Atacand, Premarin, Centrum vitamin, Oscal 500, Nasarel,  Advair Diskus, Maxair inhaler, Singulair, Allegra, aspirin, nystatin,  hydrochlorothiazide, Bufferin, Tussionex and Tequin.  Tussionex and Tequin  were started on May 11th.  The patient denies any known drug allergies or  sensitivities.   FAMILY HISTORY:  Noncontributory.  Specifically, there are no family members  with known history of lung cancer  or esophageal cancer.   PHYSICAL EXAMINATION:  GENERAL:  Slightly obese white female who is  talkative, comfortable and in no distress.  VITAL SIGNS: She has had fevers today with Temperature max of 101.8.  She is  afebrile presently.  Pulses range between 90 and 103 beats per minute today  with stable blood pressure measured between 112 and 137 mmHg respectively.  Oxygen saturation is 94% on 1.5 liters per minute.  Blood glucoses have  ranged between 157 and 283 today.  The patient just finished eating super.  HEENT:  Unrevealing.  She wears glasses.  NECK:  The neck is supple.  There is no cervical or supraclavicular  adenopathy.  CHEST:  Auscultation demonstrates clear lung fields on the left side.  The  right side is notable for diminished lung sounds at the right base as well  as coarse crackles heard throughout the right lung field.  There is no  tenderness on palpation of the thoracic rib cage.  CARDIOVASCULAR:  Exam demonstrates regular rate and rhythm.  No murmurs  rubs, or gallops are noted.  ABDOMEN:  The abdomen is mildly obese, soft, nontender.  There are no  palpable masses.  Bowel sounds are present.  The patient reports having a  bowel movement yesterday.  EXTREMITIES:  Warm and well perfused.  There is trace bilateral lower  extremity edema.  Distal pulses are diminished but palpable in both lower  legs at the ankles.  There is no venous insufficiency.  RECTAL/GU:  Exams are both deferred.   NEUROLOGIC:  Examination is grossly nonfocal.   LABORATORY DATA:  Complete blood count from the time of admission on May 13  is notable for a white blood count of 31,100 with anemia including  hemoglobin 10.7, hematocrit 32.5%.  Platelet count is elevated at 528,000.  Basic metabolic panel from the same day revealed sodium 131, potassium 3.8,  chloride 97, bicarbonate 26, BUN 8, creatinine 0.7, glucose 182.  Coagulation profile revealed prothrombin time 14.1 with an INR of 1.1 and a  PTT of 45 seconds.   DIAGNOSTIC TESTS:  Chest x-rays and chest CT scans from this hospitalization  have been reviewed.  Ther chest x-ray reveals complicated, probably  loculated right pleural effusion with opacification of much of the right  lower lobe.  The left lung remains fairly clear.  Chest CT scan confirms the  presence of large right pleural effusion which may be partially loculated and suspicious for empyema.  There is complete collapse of the right lower  lobe with a large, circular area in the right lower lobe that may represent  loculated empyema future or possibly lung abscess within the lung  parenchyma.  There is right hilar and mediastinal lymphadenopathy.  There is  no obvious lung mass  per se, although there is a great deal of atelectasis  which makes ruling out a soft-tissue mass impossible.  The lower esophagus  is quite thickened.  The empyema fluid appears to go all the way to the  mediastinum.  There is nothing specific to suggest an esophageal leak or  perforation, although this certainly can not be excluded.  The lower  esophagus is quite swollen and thickened, all the way down to the GE  junction.  There is no obvious sign of significant hiatal hernia, nor  epiphrenic diverticulum.   IMPRESSION:  Subacute versus chronic right empyema with possible right lower-  lobe lung abscess.  The patient also has CT scan findings notable for the  prominently-thickened lower esophagus, which  raises a question of whether or  not primary esophageal perforation could be the culprit.  However, based  upon the patient's clinical history, I am more suspicious that the swelling  of the esophagus may be secondary to the infectious process.  One must  always be concerned about the possibility of an underlying malignant process  either in the lung or the esophagus.   PLAN:  1. We will obtain a stat Gastrografin swallow to evaluate esophageal     integrity.  If these findings are questionable, I will ask the     radiologist to proceed with CT scan with oral contrast to better     ascertain this issue.  Assuming the esophagus does not appear to be     involved, we will need to proceed to surgery for bronchoscopy followed by     likely right thoracotomy for definitive drainage of the right empyema,     possible decortication.  2. I have outlined the issues at length the patient and her cousin, Avaree Gilberti.  All their questions have been addressed.  They understand and     accept all associated risks of surgery, including but not limited to risk     of death, stroke, myocardial infarction, acute respiratory failure,     pneumonia, sepsis, bleeding requiring transfusion.  All of their     questions have been addressed.                                               Salvatore Decent. [name], M.D.    CHO/MEDQ  D:  08/14/2002  T:  08/15/2002  Job:  [id number]   cc:   Candyce Churn, M.D.  [address] [place]  [place] [address]  Fax: [phone]   Ermalene Searing. Leander Rams, M.D.  [address] [place] [address]  Fax: [phone]

## 2011-04-09 DIAGNOSIS — M949 Disorder of cartilage, unspecified: Secondary | ICD-10-CM | POA: Diagnosis not present

## 2011-04-09 DIAGNOSIS — M899 Disorder of bone, unspecified: Secondary | ICD-10-CM | POA: Diagnosis not present

## 2011-04-09 DIAGNOSIS — N951 Menopausal and female climacteric states: Secondary | ICD-10-CM | POA: Diagnosis not present

## 2011-04-09 DIAGNOSIS — Z01419 Encounter for gynecological examination (general) (routine) without abnormal findings: Secondary | ICD-10-CM | POA: Diagnosis not present

## 2011-04-16 DIAGNOSIS — M75120 Complete rotator cuff tear or rupture of unspecified shoulder, not specified as traumatic: Secondary | ICD-10-CM | POA: Diagnosis not present

## 2011-04-22 DIAGNOSIS — M75120 Complete rotator cuff tear or rupture of unspecified shoulder, not specified as traumatic: Secondary | ICD-10-CM | POA: Diagnosis not present

## 2011-04-29 DIAGNOSIS — M75120 Complete rotator cuff tear or rupture of unspecified shoulder, not specified as traumatic: Secondary | ICD-10-CM | POA: Diagnosis not present

## 2011-05-09 NOTE — H&P (Signed)
  Grace Gilbert DOB: 09-05-1932  Chief Complaint: left shoulder pain  History of Present Illness The patient is a 78 year old female who is scheduled for an open left rotator cuff repair with Dr. Darrelyn Hillock on Wednesday May 22, 2011 at [facility]. She are 9 weeks out from when symptoms began. The activity involved repetitive lifting and bagging leaves that occurred at home. The patient reports symptoms which include shoulder pain, shoulder stiffness, decreased range of motion, night pain and inability to lay on that side. The patient describes these symptoms as moderate in severity. Symptoms are exacerbated by motion at the shoulder, elevation of the shoulder and lifting. MRI reveal torn supraspinatus and infraspinatus of the left rotator cuff.    Problem List/Past Medical Complete rotator cuff rupture, non-traumatic (727.61) Epicondylitis, lateral (726.32). 04/22/1988 *RIGHT. 04/22/1988 Degeneration, cervical disc (722.4). 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 05/18/1993 Enthesopathy, hip (726.5). 09/28/1998 High blood pressure Hypercholesterolemia Osteoarthritis Gastroesophageal Reflux Disease Asthma Diabetes Mellitus, Type I  Allergies No Known Drug Allergies. 04/16/2011   Family History Diabetes Mellitus. father and grandmother mothers side Heart Disease. mother and grandmother mothers side Hypertension. mother and father Cancer. grandmother fathers side Osteoarthritis. mother   Social History Drug/Alcohol Rehab (Previously). no Exercise. Exercises rarely Illicit drug use. no Drug/Alcohol Rehab (Currently). no Alcohol use. current drinker; drinks wine; less than 5 per week Children. 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 former smoker; smoke(d) 1 1/2 pack(s) per day Tobacco / smoke exposure. no Marital status. single Number of flights of stairs before winded. 2-3 Pain Contract.  no   Medication History Atacand (16MG  Tablet, Oral) Active. Centrum ( Oral) Active. Oscal 500/200 D-3 (500-200MG -UNIT Tablet, Oral) Active. Premarin (0.625MG  Tablet, Oral) Active. CoQ10 (200MG  Capsule, Oral) Active. Lovaza (1GM Capsule, Oral) Active. Aleve (220MG  Capsule, 1 Oral) Active. Aciphex (20MG  Tablet DR, Oral) Active. Ranitidine HCl (300MG  Tablet, Oral) Active. Asmanex 60 Metered Doses (220MCG/INH Aero Pow Br Act, Inhalation) Active. Nasonex (50MCG/ACT Suspension, Nasal) Active. Singulair (10MG  Tablet, Oral) Active. ProAir HFA (108 (90 Base)MCG/ACT Aerosol Soln, Inhalation) Active. Astelin (137MCG/SPRAY Solution, Nasal) Active. Crestor (10MG  Tablet, Oral) Active.   Past Surgical History Hysterectomy. complete (non-cancerous) Lung Surgery . right Tonsillectomy   Physical Exam She has marked weakness of her abductors. She is using more deltoid than anything in that left shoulder. She has minimal to moderate pain but her function is markedly decreased. Circulation in her hands is intact. Good function in her hand.Heart sounds normal. No murmurs. Lungs clear to auscultation. Neck supple, no bruits. Abdomen soft and nontender. Bowel sounds active. Neuro grossly intact.   Assessment & Plan Complete rotator cuff rupture, non-traumatic (727.61) Open left rotator cuff repair We may or may not need to use a graft material that is made from calf skin. This is approved by the FDA and I have not had a rejection of that graft yet. Also, we may need to use anchors. These are polyethylene anchors that stay in the bone and we use those anchors to suture the tendon down in certain cases where the tendon is completely pulled off the bone. There is always a chance of a secondary infection obviously with any surgery but we do use antibiotics preop.   Dimitri Ped, PA-C

## 2011-05-10 DIAGNOSIS — E119 Type 2 diabetes mellitus without complications: Secondary | ICD-10-CM | POA: Diagnosis not present

## 2011-05-10 DIAGNOSIS — I10 Essential (primary) hypertension: Secondary | ICD-10-CM | POA: Diagnosis not present

## 2011-05-13 ENCOUNTER — Encounter (HOSPITAL_COMMUNITY): Payer: Self-pay | Admitting: Pharmacy Technician

## 2011-05-15 ENCOUNTER — Other Ambulatory Visit: Payer: Self-pay

## 2011-05-15 ENCOUNTER — Encounter (HOSPITAL_COMMUNITY)
Admission: RE | Admit: 2011-05-15 | Discharge: 2011-05-15 | Disposition: A | Discharge status: Home / Self Care | Payer: Medicare Other | Source: Ambulatory Visit | Attending: Orthopedic Surgery | Admitting: Orthopedic Surgery

## 2011-05-15 ENCOUNTER — Encounter (HOSPITAL_COMMUNITY): Payer: Self-pay

## 2011-05-15 DIAGNOSIS — K219 Gastro-esophageal reflux disease without esophagitis: Secondary | ICD-10-CM | POA: Diagnosis not present

## 2011-05-15 DIAGNOSIS — Z79899 Other long term (current) drug therapy: Secondary | ICD-10-CM | POA: Diagnosis not present

## 2011-05-15 DIAGNOSIS — E7800 Pure hypercholesterolemia, unspecified: Secondary | ICD-10-CM | POA: Diagnosis not present

## 2011-05-15 DIAGNOSIS — M5030 Other cervical disc degeneration, unspecified cervical region: Secondary | ICD-10-CM | POA: Diagnosis not present

## 2011-05-15 DIAGNOSIS — M75120 Complete rotator cuff tear or rupture of unspecified shoulder, not specified as traumatic: Secondary | ICD-10-CM | POA: Diagnosis not present

## 2011-05-15 DIAGNOSIS — M7710 Lateral epicondylitis, unspecified elbow: Secondary | ICD-10-CM | POA: Diagnosis not present

## 2011-05-15 DIAGNOSIS — Z01812 Encounter for preprocedural laboratory examination: Secondary | ICD-10-CM | POA: Diagnosis not present

## 2011-05-15 DIAGNOSIS — M719 Bursopathy, unspecified: Secondary | ICD-10-CM | POA: Diagnosis not present

## 2011-05-15 DIAGNOSIS — I10 Essential (primary) hypertension: Secondary | ICD-10-CM | POA: Diagnosis not present

## 2011-05-15 DIAGNOSIS — E109 Type 1 diabetes mellitus without complications: Secondary | ICD-10-CM | POA: Diagnosis not present

## 2011-05-15 HISTORY — DX: Essential (primary) hypertension: I10

## 2011-05-15 HISTORY — DX: Pneumonia, unspecified organism: J18.9

## 2011-05-15 HISTORY — DX: Gastro-esophageal reflux disease without esophagitis: K21.9

## 2011-05-15 LAB — URINALYSIS, ROUTINE W REFLEX MICROSCOPIC
Bilirubin Urine: NEGATIVE
Glucose, UA: NEGATIVE mg/dL
Hgb urine dipstick: NEGATIVE
Ketones, ur: NEGATIVE mg/dL
Leukocytes, UA: NEGATIVE
Nitrite: NEGATIVE
Protein, ur: NEGATIVE mg/dL
Specific Gravity, Urine: 1.017 (ref 1.005–1.030)
Urobilinogen, UA: 0.2 mg/dL (ref 0.0–1.0)
pH: 6.5 (ref 5.0–8.0)

## 2011-05-15 LAB — SURGICAL PCR SCREEN: MRSA, PCR: NEGATIVE

## 2011-05-15 LAB — DIFFERENTIAL
Basophils Absolute: 0 10*3/uL (ref 0.0–0.1)
Basophils Relative: 0 % (ref 0–1)
Eosinophils Absolute: 0.1 10*3/uL (ref 0.0–0.7)
Eosinophils Relative: 2 % (ref 0–5)
Lymphocytes Relative: 18 % (ref 12–46)
Lymphs Abs: 1.7 10*3/uL (ref 0.7–4.0)
Monocytes Absolute: 0.9 10*3/uL (ref 0.1–1.0)
Monocytes Relative: 10 % (ref 3–12)
Neutro Abs: 6.5 10*3/uL (ref 1.7–7.7)
Neutrophils Relative %: 70 % (ref 43–77)

## 2011-05-15 LAB — COMPREHENSIVE METABOLIC PANEL
ALT: 12 U/L (ref 0–35)
AST: 21 U/L (ref 0–37)
Albumin: 3.8 g/dL (ref 3.5–5.2)
Alkaline Phosphatase: 54 U/L (ref 39–117)
BUN: 9 mg/dL (ref 6–23)
CO2: 28 mEq/L (ref 19–32)
Calcium: 9.7 mg/dL (ref 8.4–10.5)
Chloride: 102 mEq/L (ref 96–112)
Creatinine, Ser: 0.49 mg/dL — ABNORMAL LOW (ref 0.50–1.10)
GFR calc Af Amer: >90 mL/min (ref >90)
GFR calc non Af Amer: >90 mL/min (ref >90)
Glucose, Bld: 119 mg/dL — ABNORMAL HIGH (ref 70–99)
Potassium: 4.2 mEq/L (ref 3.5–5.1)
Sodium: 140 mEq/L (ref 135–145)
Total Bilirubin: 0.4 mg/dL (ref 0.3–1.2)
Total Protein: 7.4 g/dL (ref 6.0–8.3)

## 2011-05-15 LAB — PROTIME-INR
INR: 0.92 (ref 0.00–1.49)
Prothrombin Time: 12.6 seconds (ref 11.6–15.2)

## 2011-05-15 LAB — CBC
HCT: 37.6 % (ref 36.0–46.0)
Hemoglobin: 12.5 g/dL (ref 12.0–15.0)
MCH: 29.6 pg (ref 26.0–34.0)
MCHC: 33.2 g/dL (ref 30.0–36.0)
MCV: 89.1 fL (ref 78.0–100.0)
Platelets: 309 10*3/uL (ref 150–400)
RBC: 4.22 MIL/uL (ref 3.87–5.11)
RDW: 13.1 % (ref 11.5–15.5)
WBC: 9.3 10*3/uL (ref 4.0–10.5)

## 2011-05-15 LAB — APTT: aPTT: 34 seconds (ref 24–37)

## 2011-05-15 NOTE — Patient Instructions (Addendum)
20 Grace Gilbert  05/15/2011   Your procedure is scheduled on:  Wednesday 05/22/2011  Report to [facility] at 130 pm  Call this number if you have problems the morning of surgery: [phone]   Remember:   Do not eat food:after midnight.  May have clear liquids:up to 6 Hours before arrival.-up until 0930am then nothing until after surgery  Clear liquids include soda, tea, black coffee, apple or grape juice, broth.  Take these medicines the morning of surgery with A SIP OF WATER: Aciphex,Crestor, may use Astelin nasal spray , use Asmanex inhaler and Proventil inhaler if needed   Do not wear jewelry, make-up or nail polish.  Do not wear lotions, powders, or perfumes.   Do not shave 48 hours prior to surgery.(women only-shaving legs)  Do not bring valuables to the hospital.  Contacts, dentures or bridgework may not be worn into surgery.  Leave suitcase in the car. After surgery it may be brought to your room.  For patients admitted to the hospital, checkout time is 11:00 AM the day of discharge.   Patients discharged the day of surgery will not be allowed to drive home.  Name and phone number of your driver:   Special Instructions: CHG Shower Use Special Wash: 1/2 bottle night before surgery and 1/2 bottle morning of surgery.   Please read over the following fact sheets that you were given: MRSA Information

## 2011-05-15 NOTE — Pre-Procedure Instructions (Signed)
Called to inform Dr. Darrelyn Hillock of abnormal Glucose and creatinine -spoke with Mackey Birchwood, Medical Assistant who will pass this on to Dr. Darrelyn Hillock.

## 2011-05-20 DIAGNOSIS — M949 Disorder of cartilage, unspecified: Secondary | ICD-10-CM | POA: Diagnosis not present

## 2011-05-22 ENCOUNTER — Encounter (HOSPITAL_COMMUNITY): Payer: Self-pay | Admitting: Anesthesiology

## 2011-05-22 ENCOUNTER — Ambulatory Visit (HOSPITAL_COMMUNITY): Payer: Medicare Other | Admitting: Anesthesiology

## 2011-05-22 ENCOUNTER — Encounter (HOSPITAL_COMMUNITY): Payer: Self-pay | Admitting: *Deleted

## 2011-05-22 ENCOUNTER — Encounter (HOSPITAL_COMMUNITY)
Admission: RE | Disposition: A | Discharge status: Skilled Nursing Facility | Payer: Self-pay | Source: Ambulatory Visit | Attending: Orthopedic Surgery

## 2011-05-22 ENCOUNTER — Ambulatory Visit (HOSPITAL_COMMUNITY)
Admission: RE | Admit: 2011-05-22 | Discharge: 2011-05-24 | Disposition: A | Discharge status: Skilled Nursing Facility | Payer: Medicare Other | Source: Ambulatory Visit | Attending: Orthopedic Surgery | Admitting: Orthopedic Surgery

## 2011-05-22 DIAGNOSIS — M25819 Other specified joint disorders, unspecified shoulder: Secondary | ICD-10-CM | POA: Insufficient documentation

## 2011-05-22 DIAGNOSIS — I10 Essential (primary) hypertension: Secondary | ICD-10-CM | POA: Insufficient documentation

## 2011-05-22 DIAGNOSIS — M67919 Unspecified disorder of synovium and tendon, unspecified shoulder: Secondary | ICD-10-CM | POA: Insufficient documentation

## 2011-05-22 DIAGNOSIS — E7800 Pure hypercholesterolemia, unspecified: Secondary | ICD-10-CM | POA: Insufficient documentation

## 2011-05-22 DIAGNOSIS — M75120 Complete rotator cuff tear or rupture of unspecified shoulder, not specified as traumatic: Secondary | ICD-10-CM | POA: Diagnosis not present

## 2011-05-22 DIAGNOSIS — M719 Bursopathy, unspecified: Secondary | ICD-10-CM | POA: Insufficient documentation

## 2011-05-22 DIAGNOSIS — M7710 Lateral epicondylitis, unspecified elbow: Secondary | ICD-10-CM | POA: Insufficient documentation

## 2011-05-22 DIAGNOSIS — S46819A Strain of other muscles, fascia and tendons at shoulder and upper arm level, unspecified arm, initial encounter: Secondary | ICD-10-CM | POA: Diagnosis not present

## 2011-05-22 DIAGNOSIS — E109 Type 1 diabetes mellitus without complications: Secondary | ICD-10-CM | POA: Insufficient documentation

## 2011-05-22 DIAGNOSIS — Z79899 Other long term (current) drug therapy: Secondary | ICD-10-CM | POA: Insufficient documentation

## 2011-05-22 DIAGNOSIS — M5030 Other cervical disc degeneration, unspecified cervical region: Secondary | ICD-10-CM | POA: Insufficient documentation

## 2011-05-22 DIAGNOSIS — K219 Gastro-esophageal reflux disease without esophagitis: Secondary | ICD-10-CM | POA: Insufficient documentation

## 2011-05-22 DIAGNOSIS — S43499A Other sprain of unspecified shoulder joint, initial encounter: Secondary | ICD-10-CM | POA: Diagnosis not present

## 2011-05-22 DIAGNOSIS — Z9889 Other specified postprocedural states: Secondary | ICD-10-CM

## 2011-05-22 DIAGNOSIS — Z01812 Encounter for preprocedural laboratory examination: Secondary | ICD-10-CM | POA: Insufficient documentation

## 2011-05-22 DIAGNOSIS — M75110 Incomplete rotator cuff tear or rupture of unspecified shoulder, not specified as traumatic: Secondary | ICD-10-CM | POA: Diagnosis not present

## 2011-05-22 HISTORY — PX: SHOULDER OPEN ROTATOR CUFF REPAIR: SHX2407

## 2011-05-22 LAB — ABO/RH: ABO/RH(D): O POS

## 2011-05-22 LAB — TYPE AND SCREEN
ABO/RH(D): O POS
Antibody Screen: NEGATIVE

## 2011-05-22 LAB — GLUCOSE, CAPILLARY

## 2011-05-22 SURGERY — REPAIR, ROTATOR CUFF, OPEN
Anesthesia: General | Site: Shoulder | Laterality: Left | Wound class: Clean

## 2011-05-22 MED ORDER — BUPIVACAINE LIPOSOME 1.3 % IJ SUSP
20.0000 mL | INTRAMUSCULAR | Status: DC
  Filled 2011-05-22: qty 20

## 2011-05-22 MED ORDER — EPHEDRINE SULFATE 50 MG/ML IJ SOLN
INTRAMUSCULAR | Status: DC | PRN
  Administered 2011-05-22 (×2): 5 mg via INTRAVENOUS

## 2011-05-22 MED ORDER — MONTELUKAST SODIUM 10 MG PO TABS
10.0000 mg | ORAL_TABLET | Freq: Every day | ORAL | Status: DC
  Administered 2011-05-22 – 2011-05-23 (×2): 10 mg via ORAL
  Filled 2011-05-22 (×4): qty 1

## 2011-05-22 MED ORDER — METHOCARBAMOL 100 MG/ML IJ SOLN
500.0000 mg | Freq: Four times a day (QID) | INTRAVENOUS | Status: DC | PRN
  Administered 2011-05-22: 500 mg via INTRAVENOUS
  Filled 2011-05-22: qty 5

## 2011-05-22 MED ORDER — ROSUVASTATIN CALCIUM 10 MG PO TABS
10.0000 mg | ORAL_TABLET | ORAL | Status: DC
  Administered 2011-05-23: 10 mg via ORAL
  Filled 2011-05-22: qty 1

## 2011-05-22 MED ORDER — ONDANSETRON HCL 4 MG/2ML IJ SOLN: 4.0000 mg | Freq: Four times a day (QID) | INTRAMUSCULAR | Status: DC | PRN

## 2011-05-22 MED ORDER — LACTATED RINGERS IV SOLN
INTRAVENOUS | Status: DC | PRN
  Administered 2011-05-22 (×2): via INTRAVENOUS

## 2011-05-22 MED ORDER — SODIUM CHLORIDE 0.9 % IR SOLN
Status: DC | PRN
  Administered 2011-05-22: 18:00:00

## 2011-05-22 MED ORDER — CEFAZOLIN SODIUM 1-5 GM-% IV SOLN: 1.0000 g | INTRAVENOUS | Status: DC

## 2011-05-22 MED ORDER — LIDOCAINE HCL (CARDIAC) 20 MG/ML IV SOLN
INTRAVENOUS | Status: DC | PRN
  Administered 2011-05-22: 50 mg via INTRAVENOUS

## 2011-05-22 MED ORDER — MIDAZOLAM HCL 5 MG/5ML IJ SOLN
INTRAMUSCULAR | Status: DC | PRN
  Administered 2011-05-22 (×2): 0.5 mg via INTRAVENOUS

## 2011-05-22 MED ORDER — POLYETHYLENE GLYCOL 3350 17 G PO PACK
17.0000 g | PACK | Freq: Every day | ORAL | Status: DC | PRN
  Filled 2011-05-22: qty 1

## 2011-05-22 MED ORDER — DEXAMETHASONE SODIUM PHOSPHATE 10 MG/ML IJ SOLN
INTRAMUSCULAR | Status: DC | PRN
  Administered 2011-05-22: 10 mg via INTRAVENOUS

## 2011-05-22 MED ORDER — PROMETHAZINE HCL 25 MG/ML IJ SOLN: 6.2500 mg | INTRAMUSCULAR | Status: DC | PRN

## 2011-05-22 MED ORDER — BISACODYL 10 MG RE SUPP: 10.0000 mg | Freq: Every day | RECTAL | Status: DC | PRN

## 2011-05-22 MED ORDER — LACTATED RINGERS IV SOLN: INTRAVENOUS | Status: DC

## 2011-05-22 MED ORDER — FENTANYL CITRATE 0.05 MG/ML IJ SOLN
INTRAMUSCULAR | Status: DC | PRN
  Administered 2011-05-22 (×5): 50 ug via INTRAVENOUS
  Administered 2011-05-22: 100 ug via INTRAVENOUS

## 2011-05-22 MED ORDER — ACETAMINOPHEN 10 MG/ML IV SOLN
INTRAVENOUS | Status: DC | PRN
  Administered 2011-05-22: 1000 mg via INTRAVENOUS

## 2011-05-22 MED ORDER — HYDROMORPHONE HCL PF 1 MG/ML IJ SOLN
0.2500 mg | INTRAMUSCULAR | Status: DC | PRN
  Administered 2011-05-22 (×3): 0.5 mg via INTRAVENOUS

## 2011-05-22 MED ORDER — PROPOFOL 10 MG/ML IV EMUL
INTRAVENOUS | Status: DC | PRN
  Administered 2011-05-22: 150 mg via INTRAVENOUS
  Administered 2011-05-22: 25 mg via INTRAVENOUS

## 2011-05-22 MED ORDER — ONDANSETRON HCL 4 MG PO TABS: 4.0000 mg | ORAL_TABLET | Freq: Four times a day (QID) | ORAL | Status: DC | PRN

## 2011-05-22 MED ORDER — OLMESARTAN 10 MG HALF TABLET
10.0000 mg | ORAL_TABLET | Freq: Every day | ORAL | Status: DC
  Administered 2011-05-22 – 2011-05-24 (×3): 10 mg via ORAL
  Filled 2011-05-22 (×4): qty 1

## 2011-05-22 MED ORDER — ONDANSETRON HCL 4 MG/2ML IJ SOLN
INTRAMUSCULAR | Status: DC | PRN
  Administered 2011-05-22: 2 mg via INTRAVENOUS
  Administered 2011-05-22 (×2): 1 mg via INTRAVENOUS

## 2011-05-22 MED ORDER — LATANOPROST 0.005 % OP SOLN
1.0000 [drp] | Freq: Every day | OPHTHALMIC | Status: DC
  Administered 2011-05-22 – 2011-05-23 (×2): 1 [drp] via OPHTHALMIC
  Filled 2011-05-22: qty 2.5

## 2011-05-22 MED ORDER — CEFAZOLIN SODIUM 1-5 GM-% IV SOLN
INTRAVENOUS | Status: DC | PRN
  Administered 2011-05-22: 1 g via INTRAVENOUS

## 2011-05-22 MED ORDER — METHOCARBAMOL 500 MG PO TABS
500.0000 mg | ORAL_TABLET | Freq: Four times a day (QID) | ORAL | Status: DC | PRN
  Administered 2011-05-23 – 2011-05-24 (×3): 500 mg via ORAL
  Filled 2011-05-22 (×4): qty 1

## 2011-05-22 MED ORDER — CEFAZOLIN SODIUM 1-5 GM-% IV SOLN
1.0000 g | Freq: Four times a day (QID) | INTRAVENOUS | Status: AC
  Administered 2011-05-22 – 2011-05-23 (×3): 1 g via INTRAVENOUS
  Filled 2011-05-22 (×3): qty 50

## 2011-05-22 MED ORDER — PANTOPRAZOLE SODIUM 40 MG PO TBEC
40.0000 mg | DELAYED_RELEASE_TABLET | Freq: Every day | ORAL | Status: DC
  Administered 2011-05-22 – 2011-05-24 (×3): 40 mg via ORAL
  Filled 2011-05-22 (×4): qty 1

## 2011-05-22 MED ORDER — PHENOL 1.4 % MT LIQD
1.0000 | OROMUCOSAL | Status: DC | PRN
  Filled 2011-05-22: qty 177

## 2011-05-22 MED ORDER — SUCCINYLCHOLINE CHLORIDE 20 MG/ML IJ SOLN
INTRAMUSCULAR | Status: DC | PRN
  Administered 2011-05-22: 100 mg via INTRAVENOUS

## 2011-05-22 MED ORDER — HYDROCODONE-ACETAMINOPHEN 5-325 MG PO TABS
1.0000 | ORAL_TABLET | ORAL | Status: DC | PRN
  Administered 2011-05-22 – 2011-05-24 (×6): 1 via ORAL
  Filled 2011-05-22 (×6): qty 1

## 2011-05-22 MED ORDER — FLUTICASONE PROPIONATE 50 MCG/ACT NA SUSP
1.0000 | Freq: Every day | NASAL | Status: DC
  Administered 2011-05-22 – 2011-05-24 (×3): 1 via NASAL
  Filled 2011-05-22: qty 16

## 2011-05-22 MED ORDER — PHENYLEPHRINE HCL 10 MG/ML IJ SOLN
INTRAMUSCULAR | Status: DC | PRN
  Administered 2011-05-22: 40 ug via INTRAVENOUS

## 2011-05-22 MED ORDER — INSULIN ASPART 100 UNIT/ML ~~LOC~~ SOLN
0.0000 [IU] | Freq: Three times a day (TID) | SUBCUTANEOUS | Status: DC
  Administered 2011-05-23 – 2011-05-24 (×3): 2 [IU] via SUBCUTANEOUS
  Filled 2011-05-22: qty 3

## 2011-05-22 MED ORDER — MENTHOL 3 MG MT LOZG
1.0000 | LOZENGE | OROMUCOSAL | Status: DC | PRN
  Filled 2011-05-22: qty 9

## 2011-05-22 MED ORDER — THROMBIN 5000 UNITS EX SOLR
CUTANEOUS | Status: DC | PRN
  Administered 2011-05-22: 5000 [IU] via TOPICAL

## 2011-05-22 MED ORDER — FLUTICASONE PROPIONATE HFA 44 MCG/ACT IN AERO
2.0000 | INHALATION_SPRAY | Freq: Two times a day (BID) | RESPIRATORY_TRACT | Status: DC
  Administered 2011-05-23 (×2): 2 via RESPIRATORY_TRACT
  Filled 2011-05-22: qty 10.6

## 2011-05-22 MED ORDER — LACTATED RINGERS IV SOLN
INTRAVENOUS | Status: DC
  Administered 2011-05-23 (×2): via INTRAVENOUS

## 2011-05-22 MED ORDER — FLEET ENEMA 7-19 GM/118ML RE ENEM: 1.0000 | ENEMA | Freq: Once | RECTAL | Status: AC | PRN

## 2011-05-22 MED ORDER — CISATRACURIUM BESYLATE 2 MG/ML IV SOLN
INTRAVENOUS | Status: DC | PRN
  Administered 2011-05-22: 2 mg via INTRAVENOUS
  Administered 2011-05-22: 6 mg via INTRAVENOUS

## 2011-05-22 MED ORDER — AZELASTINE HCL 0.1 % NA SOLN
1.0000 | Freq: Every morning | NASAL | Status: DC
  Administered 2011-05-23 – 2011-05-24 (×2): 1 via NASAL
  Filled 2011-05-22: qty 30

## 2011-05-22 MED ORDER — ACETAMINOPHEN 650 MG RE SUPP: 650.0000 mg | Freq: Four times a day (QID) | RECTAL | Status: DC | PRN

## 2011-05-22 MED ORDER — BUPIVACAINE LIPOSOME 1.3 % IJ SUSP
INTRAMUSCULAR | Status: DC | PRN
  Administered 2011-05-22: 20 mL

## 2011-05-22 MED ORDER — HYDROMORPHONE HCL PF 1 MG/ML IJ SOLN: 0.5000 mg | INTRAMUSCULAR | Status: DC | PRN

## 2011-05-22 MED ORDER — OXYCODONE-ACETAMINOPHEN 5-325 MG PO TABS
1.0000 | ORAL_TABLET | ORAL | Status: DC | PRN
  Administered 2011-05-24: 2 via ORAL
  Filled 2011-05-22: qty 2

## 2011-05-22 MED ORDER — ACETAMINOPHEN 325 MG PO TABS: 650.0000 mg | ORAL_TABLET | Freq: Four times a day (QID) | ORAL | Status: DC | PRN

## 2011-05-22 MED ORDER — ALBUTEROL SULFATE HFA 108 (90 BASE) MCG/ACT IN AERS
2.0000 | INHALATION_SPRAY | Freq: Four times a day (QID) | RESPIRATORY_TRACT | Status: DC | PRN
  Filled 2011-05-22: qty 6.7

## 2011-05-22 MED ORDER — FAMOTIDINE 10 MG PO TABS
10.0000 mg | ORAL_TABLET | Freq: Every day | ORAL | Status: DC
  Administered 2011-05-22 – 2011-05-24 (×3): 10 mg via ORAL
  Filled 2011-05-22 (×4): qty 1

## 2011-05-22 SURGICAL SUPPLY — 46 items
ANCHOR PEEK/SS KNTLS 5.5MM (Anchor) ×6 IMPLANT
BAG ZIPLOCK 12X15 (MISCELLANEOUS) ×2 IMPLANT
BLADE OSCILLATING/SAGITTAL (BLADE) ×2
BLADE SW THK.38XMED LNG THN (BLADE) ×1 IMPLANT
BNDG COHESIVE 6X5 TAN NS LF (GAUZE/BANDAGES/DRESSINGS) ×2 IMPLANT
BUR OVAL CARBIDE 4.0 (BURR) ×2 IMPLANT
CLEANER TIP ELECTROSURG 2X2 (MISCELLANEOUS) ×2 IMPLANT
CLOTH BEACON ORANGE TIMEOUT ST (SAFETY) ×2 IMPLANT
DRAPE POUCH INSTRU U-SHP 10X18 (DRAPES) ×2 IMPLANT
DRSG EMULSION OIL 3X3 NADH (GAUZE/BANDAGES/DRESSINGS) ×2 IMPLANT
DRSG PAD ABDOMINAL 8X10 ST (GAUZE/BANDAGES/DRESSINGS) ×2 IMPLANT
DURAPREP 26ML APPLICATOR (WOUND CARE) ×2 IMPLANT
ELECT REM PT RETURN 9FT ADLT (ELECTROSURGICAL) ×2
ELECTRODE REM PT RTRN 9FT ADLT (ELECTROSURGICAL) ×1 IMPLANT
FLOSEAL 10ML (HEMOSTASIS) IMPLANT
GAUZE SPONGE 4X4 12PLY STRL LF (GAUZE/BANDAGES/DRESSINGS) ×2 IMPLANT
GLOVE BIO SURGEON STRL SZ 6.5 (GLOVE) ×2 IMPLANT
GLOVE BIOGEL PI IND STRL 8 (GLOVE) ×1 IMPLANT
GLOVE BIOGEL PI IND STRL 8.5 (GLOVE) ×1 IMPLANT
GLOVE BIOGEL PI INDICATOR 8 (GLOVE) ×1
GLOVE BIOGEL PI INDICATOR 8.5 (GLOVE) ×1
GLOVE ECLIPSE 8.0 STRL XLNG CF (GLOVE) ×4 IMPLANT
GOWN PREVENTION PLUS LG XLONG (DISPOSABLE) ×4 IMPLANT
GOWN STRL REIN XL XLG (GOWN DISPOSABLE) ×4 IMPLANT
KIT BASIN OR (CUSTOM PROCEDURE TRAY) ×2 IMPLANT
MANIFOLD NEPTUNE II (INSTRUMENTS) ×2 IMPLANT
NEEDLE MA TROC 1/2 (NEEDLE) IMPLANT
NS IRRIG 1000ML POUR BTL (IV SOLUTION) ×2 IMPLANT
PACK SHOULDER CUSTOM OPM052 (CUSTOM PROCEDURE TRAY) ×2 IMPLANT
PASSER SUT SWANSON 36MM LOOP (INSTRUMENTS) IMPLANT
POSITIONER SURGICAL ARM (MISCELLANEOUS) ×2 IMPLANT
SLING ARM IMMOBILIZER LRG (SOFTGOODS) ×2 IMPLANT
SPONGE SURGIFOAM ABS GEL 100 (HEMOSTASIS) ×2 IMPLANT
STAPLER VISISTAT 35W (STAPLE) ×2 IMPLANT
STRIP CLOSURE SKIN 1/2X4 (GAUZE/BANDAGES/DRESSINGS) ×2 IMPLANT
SUCTION FRAZIER 12FR DISP (SUCTIONS) ×2 IMPLANT
SUT BONE WAX W31G (SUTURE) ×2 IMPLANT
SUT ETHIBOND NAB CT1 #1 30IN (SUTURE) IMPLANT
SUT MNCRL AB 4-0 PS2 18 (SUTURE) ×2 IMPLANT
SUT VIC AB 0 CT1 27 (SUTURE) ×1
SUT VIC AB 0 CT1 27XBRD ANTBC (SUTURE) ×1 IMPLANT
SUT VIC AB 1 CT1 27 (SUTURE) ×2
SUT VIC AB 1 CT1 27XBRD ANTBC (SUTURE) ×2 IMPLANT
SUT VIC AB 2-0 CT1 27 (SUTURE)
SUT VIC AB 2-0 CT1 27XBRD (SUTURE) IMPLANT
TOWEL OR 17X26 10 PK STRL BLUE (TOWEL DISPOSABLE) ×4 IMPLANT

## 2011-05-22 NOTE — Brief Op Note (Signed)
05/22/2011  6:18 PM  PATIENT:  Grace Gilbert  78 y.o. female  PRE-OPERATIVE DIAGNOSIS:  Left Shoulder Rotator Cuff Tear  POST-OPERATIVE DIAGNOSIS:  let shoulder rotator cuff repair  PROCEDURE:  Procedure(s) (LRB): ROTATOR CUFF REPAIR SHOULDER OPEN (Left)  SURGEON:  Surgeon(s) and Role:    * Jacki Cones, MD - Primary  PHYSICIAN ASSISTANT: Dimitri Ped PA    ANESTHESIA:   general  EBL:  Total I/O In: 900 [I.V.:900] Out: -   BLOOD ADMINISTERED:none  DRAINS: none   LOCAL MEDICATIONS USED:  BUPIVICAINE 20cc .  SPECIMEN:  No Specimen  DISPOSITION OF SPECIMEN:  N/A  COUNTS:  YES  TOURNIQUET:  * No tourniquets in log *  DICTATION: .Other Dictation: Dictation Number [id number]  PLAN OF CARE: Admit for overnight observation  PATIENT DISPOSITION:  PACU - hemodynamically stable.   Delay start of Pharmacological VTE agent (>24hrs) due to surgical blood loss or risk of bleeding: yes

## 2011-05-22 NOTE — Transfer of Care (Signed)
Immediate Anesthesia Transfer of Care Note  Patient: Grace Gilbert  Procedure(s) Performed: Procedure(s) (LRB): ROTATOR CUFF REPAIR SHOULDER OPEN (Left)  Patient Location: PACU  Anesthesia Type: General  Level of Consciousness: sedated, patient cooperative and responds to stimulaton  Airway & Oxygen Therapy: Patient Spontanous Breathing and Patient connected to face mask oxgen  Post-op Assessment: Report given to PACU RN and Post -op Vital signs reviewed and stable  Post vital signs: Reviewed and stable  Complications: No apparent anesthesia complications

## 2011-05-22 NOTE — Anesthesia Preprocedure Evaluation (Addendum)
Anesthesia Evaluation  Patient identified by MRN, date of birth, ID band Patient awake    Reviewed: Allergy & Precautions, H&P , NPO status , Patient's Chart, lab work & pertinent test results, reviewed documented beta blocker date and time   Airway Mallampati: II TM Distance: >3 FB Neck ROM: Full    Dental  (+) Teeth Intact and Dental Advisory Given   Pulmonary asthma , former smoker clear to auscultation  + decreased breath sounds      Cardiovascular hypertension, Pt. on medications Regular Normal denies cardiac symptoms   Neuro/Psych Negative Neurological ROS  Negative Psych ROS   GI/Hepatic negative GI ROS, Neg liver ROS,   Endo/Other  Diabetes mellitus-Diet controlled  Renal/GU negative Renal ROS  Genitourinary negative   Musculoskeletal negative musculoskeletal ROS (+)   Abdominal   Peds negative pediatric ROS (+)  Hematology negative hematology ROS (+)   Anesthesia Other Findings   Reproductive/Obstetrics negative OB ROS                           Anesthesia Physical Anesthesia Plan  ASA: III  Anesthesia Plan: General   Post-op Pain Management:    Induction: Intravenous  Airway Management Planned: Oral ETT  Additional Equipment:   Intra-op Plan:   Post-operative Plan: Extubation in OR  Informed Consent: I have reviewed the patients History and Physical, chart, labs and discussed the procedure including the risks, benefits and alternatives for the proposed anesthesia with the patient or authorized representative who has indicated his/her understanding and acceptance.   Dental advisory given  Plan Discussed with: CRNA and Surgeon  Anesthesia Plan Comments:         Anesthesia Quick Evaluation

## 2011-05-22 NOTE — Interval H&P Note (Signed)
History and Physical Interval Note:  05/22/2011 3:51 PM  Grace Gilbert  has presented today for surgery, with the diagnosis of Left Shoulder Rotator Cuff Tear  The various methods of treatment have been discussed with the patient and family. After consideration of risks, benefits and other options for treatment, the patient has consented to  Procedure(s) (LRB): ROTATOR CUFF REPAIR SHOULDER OPEN (Left) as a surgical intervention .  The patients' history has been reviewed, patient examined, no change in status, stable for surgery.  I have reviewed the patients' chart and labs.  Questions were answered to the patient's satisfaction.     [name] A

## 2011-05-22 NOTE — Anesthesia Postprocedure Evaluation (Signed)
  Anesthesia Post-op Note  Patient: Grace Gilbert  Procedure(s) Performed: Procedure(s) (LRB): ROTATOR CUFF REPAIR SHOULDER OPEN (Left)  Patient Location: PACU  Anesthesia Type: General  Level of Consciousness: oriented and sedated  Airway and Oxygen Therapy: Patient Spontanous Breathing and Patient connected to nasal cannula oxygen  Post-op Pain: mild  Post-op Assessment: Post-op Vital signs reviewed, Patient's Cardiovascular Status Stable, Respiratory Function Stable and Patent Airway  Post-op Vital Signs: stable  Complications: No apparent anesthesia complications

## 2011-05-22 NOTE — Preoperative (Signed)
Beta Blockers   Reason not to administer Beta Blockers:Not Applicable 

## 2011-05-23 DIAGNOSIS — M75120 Complete rotator cuff tear or rupture of unspecified shoulder, not specified as traumatic: Secondary | ICD-10-CM | POA: Diagnosis not present

## 2011-05-23 DIAGNOSIS — M7710 Lateral epicondylitis, unspecified elbow: Secondary | ICD-10-CM | POA: Diagnosis not present

## 2011-05-23 DIAGNOSIS — M5030 Other cervical disc degeneration, unspecified cervical region: Secondary | ICD-10-CM | POA: Diagnosis not present

## 2011-05-23 DIAGNOSIS — M67919 Unspecified disorder of synovium and tendon, unspecified shoulder: Secondary | ICD-10-CM | POA: Diagnosis not present

## 2011-05-23 DIAGNOSIS — I10 Essential (primary) hypertension: Secondary | ICD-10-CM | POA: Diagnosis not present

## 2011-05-23 LAB — GLUCOSE, CAPILLARY: Glucose-Capillary: 111 mg/dL — ABNORMAL HIGH (ref 70–99)

## 2011-05-23 MED ORDER — METHOCARBAMOL 500 MG PO TABS: 500.0000 mg | ORAL_TABLET | Freq: Four times a day (QID) | ORAL | Status: AC | PRN

## 2011-05-23 MED ORDER — OXYCODONE-ACETAMINOPHEN 5-325 MG PO TABS: 1.0000 | ORAL_TABLET | ORAL | Status: AC | PRN

## 2011-05-23 NOTE — Progress Notes (Signed)
Subjective: Doing better. I explained to her that she would have to go to SNF since she lives alone.Her Rotator Cuff was in very poor condition.   Objective: Vital signs in last 24 hours: Temp:  [97.3 F (36.3 C)-98.9 F (37.2 C)] 97.8 F (36.6 C) (02/21 0605) Pulse Rate:  [85-105] 99  (02/21 0605) Resp:  [10-18] 16  (02/21 0605) BP: (122-174)/(67-84) 132/72 mmHg (02/21 0605) SpO2:  [95 %-100 %] 97 % (02/21 0605) Weight:  [67.994 kg (149 lb 14.4 oz)] 67.994 kg (149 lb 14.4 oz) (02/20 2020)  Intake/Output from previous day: 02/20 0701 - 02/21 0700 In: 3306.7 [P.O.:600; I.V.:2606.7; IV Piggyback:100] Out: 250 [Urine:250] Intake/Output this shift:    No results found for this basename: HGB:5 in the last 72 hours No results found for this basename: WBC:2,RBC:2,HCT:2,PLT:2 in the last 72 hours No results found for this basename: NA:2,K:2,CL:2,CO2:2,BUN:2,CREATININE:2,GLUCOSE:2,CALCIUM:2 in the last 72 hours No results found for this basename: LABPT:2,INR:2 in the last 72 hours  Neurologically intact  Assessment/Plan: Will try to admit to SNF today. She desires Ocean[facility].   Shaylynne Lunt A 05/23/2011, 7:16 AM

## 2011-05-23 NOTE — Progress Notes (Signed)
Occupational Therapy Note Eval completed and located in shadow chart. Pt is planning for SNF (possibly today per chart) as she lives alone. Will follow if still here on acute after today. Judithann Sauger OTR/L [phone] 05/23/2011

## 2011-05-23 NOTE — Op Note (Signed)
NAMETAYLAN, MAYHAN NO.:  [id number]  MEDICAL RECORD NO.:  [id number]  LOCATION:  1620                         FACILITY:  [facility]  PHYSICIAN:  Georges Lynch. Kelsey Durflinger, M.D.DATE OF BIRTH:  June 09, 1932  DATE OF PROCEDURE:  05/22/2011 DATE OF DISCHARGE:                              OPERATIVE REPORT   SURGEON:  Georges Lynch. Darrelyn Hillock, M.D.  ASSISTANT:  Dimitri Ped, [place]  PREOPERATIVE DIAGNOSES: 1. Complex, complete retracted rotator cuff tendon tear of the left     shoulder. 2. Severe impingement syndrome, left shoulder.  POSTOPERATIVE DIAGNOSES: 1. Complex, complete retracted rotator cuff tendon tear of the left     shoulder. 2. Severe impingement syndrome, left shoulder.  OPERATION: 1. Open acromionectomy and acromioplasty. 2. Excision of subdeltoid bursa. 3. Repair of a complete retracted complex rotator cuff tendon tear.  PROCEDURE IN DETAIL:  Under general anesthesia, routine orthopedic prep and draping of the left upper extremity was carried out.  The appropriate time-out was carried out before any surgery.  She was placed in a beach-chair position.  She had 1 g of IV Ancef.  Prior to that, I marked the appropriate left upper extremity in the holding area.  At this time, an incision was made over the anterior aspect of the left shoulder.  She had bleeders identified and cauterized.  I dissected by sharp dissection a portion of the deltoid tendon off the acromion.  I split a small proximal portion of the deltoid muscle.  Immediately upon going down to the subacromial space,  we came up against quite a bit of serous fluid, which was indicative of a complete tear.  I thoroughly irrigated out the shoulder, protected the rotator cuff, and I utilized the oscillating saw, did an acromionectomy, and then utilized a bur for acromioplasty.  Following that, I completed the bursectomy.  I noted the tendon was retracted completely back from the humeral head.  The  humeral head lateral aspect was __________.  I simply utilized the bur to burr down lateral articular surface.  I then utilized 2 anchors into the shoulder and then brought the tendon down  to repair the rotator cuff in the usual fashion.  Note, we thoroughly irrigated out the area, reapproximated deltoid tendon muscle in usual fashion.  Subcu was closed with 0 Vicryl, skin was closed with running subcuticular sutures.  Sterile dressings were applied.  Patient was placed in a shoulder immobilizer.  Note:  At the end of procedure, I injected 20 cc of Exparel into the soft tissue structures.          ______________________________ Georges Lynch Darrelyn Hillock, M.D.     RAG/MEDQ  D:  05/22/2011  T:  05/23/2011  Job:  [id number]  cc:   Hal T. Stoneking, M.D. Fax: [phone]

## 2011-05-23 NOTE — Progress Notes (Signed)
CSW assisting with D/C planning. Pt will have ST SNF placement at [facility]. Bed available 2/22. Pt is aware she willing being paying pvt. for this placement and is agreeable with plan. FL2 in shadow chart for MD signature. CSW will assist with d/c planning to SNF.

## 2011-05-23 NOTE — Discharge Summary (Addendum)
Physician Discharge Summary   Patient ID: Grace Gilbert MRN: [id number] DOB/AGE: 1933-02-27 78 y.o.  Admit date: 05/22/2011 Discharge date: 05/23/2011  Primary Diagnosis: Left rotator cuff tear  Admission Diagnoses: Past Medical History  Diagnosis Date  . Hypertension   . GERD (gastroesophageal reflux disease)   . Pneumonia 12-15 yrs.ago    yrs. ago  . Asthma   . Diabetes mellitus     borderline and under control-diet ,exercise    Discharge Diagnoses:  Left rotator cuff tear S/p open left rotator cuff repair  Procedure: Procedure(s) (LRB): ROTATOR CUFF REPAIR SHOULDER OPEN (Left)   Consults: None  HPI: The patient's symptoms began 11 weeks ago. The activity involved repetitive lifting and bagging leaves that occurred at home. The patient reported symptoms which include shoulder pain, shoulder stiffness, decreased range of motion, night pain and inability to lay on that side. The patient described these symptoms as moderate in severity. Symptoms were exacerbated by motion at the shoulder, elevation of the shoulder and lifting. MRI reveal torn supraspinatus and infraspinatus of the left rotator cuff.    Laboratory Data: Hospital Outpatient Visit on 05/15/2011  Component Date Value Range Status  . aPTT (seconds) 05/15/2011 34  24-37 Final  . WBC (K/uL) 05/15/2011 9.3  4.0-10.5 Final  . RBC (MIL/uL) 05/15/2011 4.22  3.87-5.11 Final  . Hemoglobin (g/dL) 78/29/5621 30.8  65.7-84.6 Final  . HCT (%) 05/15/2011 37.6  36.0-46.0 Final  . MCV (fL) 05/15/2011 89.1  78.0-100.0 Final  . MCH (pg) 05/15/2011 29.6  26.0-34.0 Final  . MCHC (g/dL) 96/29/5284 13.2  44.0-10.2 Final  . RDW (%) 05/15/2011 13.1  11.5-15.5 Final  . Platelets (K/uL) 05/15/2011 309  150-400 Final  . Sodium (mEq/L) 05/15/2011 140  135-145 Final  . Potassium (mEq/L) 05/15/2011 4.2  3.5-5.1 Final  . Chloride (mEq/L) 05/15/2011 102  96-112 Final  . CO2 (mEq/L) 05/15/2011 28  19-32 Final  . Glucose, Bld (mg/dL)  72/53/6644 034* 74-25 Final  . BUN (mg/dL) 95/63/8756 9  4-33 Final  . Creatinine, Ser (mg/dL) 29/51/8841 6.60* 6.30-1.60 Final  . Calcium (mg/dL) 10/93/2355 9.7  7.3-22.0 Final  . Total Protein (g/dL) 25/42/7062 7.4  3.7-6.2 Final  . Albumin (g/dL) 83/15/1761 3.8  6.0-7.3 Final  . AST (U/L) 05/15/2011 21  0-37 Final  . ALT (U/L) 05/15/2011 12  0-35 Final  . Alkaline Phosphatase (U/L) 05/15/2011 54  39-117 Final  . Total Bilirubin (mg/dL) 71/08/2692 0.4  8.5-4.6 Final  . GFR calc non Af Amer (mL/min) 05/15/2011 >90  >90 Final  . GFR calc Af Amer (mL/min) 05/15/2011 >90  >90 Final   Comment:                                 The eGFR has been calculated                          using the CKD EPI equation.                          This calculation has not been                          validated in all clinical  situations.                          eGFR's persistently                          <90 mL/min signify                          possible Chronic Kidney Disease.  Marland Kitchen Neutrophils Relative (%) 05/15/2011 70  43-77 Final  . Neutro Abs (K/uL) 05/15/2011 6.5  1.7-7.7 Final  . Lymphocytes Relative (%) 05/15/2011 18  12-46 Final  . Lymphs Abs (K/uL) 05/15/2011 1.7  0.7-4.0 Final  . Monocytes Relative (%) 05/15/2011 10  3-12 Final  . Monocytes Absolute (K/uL) 05/15/2011 0.9  0.1-1.0 Final  . Eosinophils Relative (%) 05/15/2011 2  0-5 Final  . Eosinophils Absolute (K/uL) 05/15/2011 0.1  0.0-0.7 Final  . Basophils Relative (%) 05/15/2011 0  0-1 Final  . Basophils Absolute (K/uL) 05/15/2011 0.0  0.0-0.1 Final  . Prothrombin Time (seconds) 05/15/2011 12.6  11.6-15.2 Final  . INR  05/15/2011 0.92  0.00-1.49 Final  . Color, Urine  05/15/2011 YELLOW  YELLOW Final  . APPearance  05/15/2011 CLEAR  CLEAR Final  . Specific Gravity, Urine  05/15/2011 1.017  1.005-1.030 Final  . pH  05/15/2011 6.5  5.0-8.0 Final  . Glucose, UA (mg/dL) 44/04/270 NEGATIVE  NEGATIVE Final  . Hgb  urine dipstick  05/15/2011 NEGATIVE  NEGATIVE Final  . Bilirubin Urine  05/15/2011 NEGATIVE  NEGATIVE Final  . Ketones, ur (mg/dL) 53/66/4403 NEGATIVE  NEGATIVE Final  . Protein, ur (mg/dL) 47/42/5956 NEGATIVE  NEGATIVE Final  . Urobilinogen, UA (mg/dL) 38/75/6433 0.2  2.9-5.1 Final  . Nitrite  05/15/2011 NEGATIVE  NEGATIVE Final  . Leukocytes, UA  05/15/2011 NEGATIVE  NEGATIVE Final   MICROSCOPIC NOT DONE ON URINES WITH NEGATIVE PROTEIN, BLOOD, LEUKOCYTES, NITRITE, OR GLUCOSE <1000 mg/dL.  Marland Kitchen MRSA, PCR  05/15/2011 NEGATIVE  NEGATIVE Final  . Staphylococcus aureus  05/15/2011 NEGATIVE  NEGATIVE Final   Comment:                                 The Xpert SA Assay (FDA                          approved for NASAL specimens                          only), is one component of                          a comprehensive surveillance                          program.  It is not intended                          to diagnose infection nor to                          guide or monitor treatment.    EKG: Orders placed in visit on 05/15/11  . EKG 12-LEAD     Hospital Course: Patient was  admitted to [facility] and taken to the OR and underwent the above state procedure without complications.  Patient tolerated the procedure well and was later transferred to the recovery room and then to the orthopaedic floor for postoperative care.  They were given PO and IV analgesics for pain control following their surgery. Discharge planning consulted to help with postop disposition and equipment needs.  Patient had a good night on the evening of surgery. Patient stated that she did not have help at home and would need SNF placement. Social worker consulted. Patient was seen in rounds post-op day one and was ready to be discharged to SNF when available. Patient discharge to postponed due to SNF arrangements. Discharged post-op day two.     Discharge Medications: Prior to Admission medications   Medication  Sig Start Date End Date Taking? Authorizing Provider  azelastine (ASTELIN) 137 MCG/SPRAY nasal spray Place 1 spray into the nose every morning. Use in each nostril as directed   Yes Historical Provider, MD  Calcium Carb-Cholecalciferol 600-500 MG-UNIT CAPS Take 0.5 tablets by mouth 2 (two) times daily.   Yes Historical Provider, MD  candesartan (ATACAND) 16 MG tablet Take 8 mg by mouth daily after breakfast.   Yes Historical Provider, MD  Coenzyme Q10 200 MG capsule Take 200 mg by mouth daily.   Yes Historical Provider, MD  estrogens, conjugated, (PREMARIN) 0.625 MG tablet Take 0.625 mg by mouth daily. Skip days 1-5.   Yes Historical Provider, MD  latanoprost (XALATAN) 0.005 % ophthalmic solution Place 1 drop into both eyes at bedtime.   Yes Historical Provider, MD  mometasone (ASMANEX) 220 MCG/INH inhaler Inhale 1 puff into the lungs daily.   Yes Historical Provider, MD  mometasone (NASONEX) 50 MCG/ACT nasal spray Place 1 spray into the nose daily.   Yes Historical Provider, MD  montelukast (SINGULAIR) 10 MG tablet Take 10 mg by mouth at bedtime.   Yes Historical Provider, MD  Multiple Vitamin (MULITIVITAMIN WITH MINERALS) TABS Take 1 tablet by mouth daily.   Yes Historical Provider, MD  naproxen sodium (ANAPROX) 220 MG tablet Take 220 mg by mouth 2 (two) times daily with a meal.   Yes Historical Provider, MD  omega-3 acid ethyl esters (LOVAZA) 1 G capsule Take 1 g by mouth 2 (two) times daily.   Yes Historical Provider, MD  RABEprazole (ACIPHEX) 20 MG tablet Take 20-40 mg by mouth daily.   Yes Historical Provider, MD  ranitidine (ZANTAC) 300 MG tablet Take 300 mg by mouth every evening.   Yes Historical Provider, MD  rosuvastatin (CRESTOR) 10 MG tablet Take 10 mg by mouth 2 (two) times a week. 1 tablet on Wednesday and Sunday only    Yes Historical Provider, MD  albuterol (PROVENTIL HFA;VENTOLIN HFA) 108 (90 BASE) MCG/ACT inhaler Inhale 2 puffs into the lungs every 6 (six) hours as needed.  Wheezing     Historical Provider, MD  methocarbamol (ROBAXIN) 500 MG tablet Take 1 tablet (500 mg total) by mouth every 6 (six) hours as needed. 05/23/11 06/02/11  Turki Tapanes Tamala Ser, PA  oxyCODONE-acetaminophen (PERCOCET) 5-325 MG per tablet Take 1-2 tablets by mouth every 4 (four) hours as needed. 05/23/11 06/02/11  Redmond Whittley Tamala Ser, PA    Diet: carb modified - medium  Activity: Left arm should remain in sling at all times  Follow-up:in 1 week  Disposition: SNF, improved condition Discharged Condition: good   Discharge Orders    Future Orders Please Complete By Expires   Diet Carb  Modified      Call MD / Call 911      Comments:   If you experience chest pain or shortness of breath, CALL 911 and be transported to the hospital emergency room.  If you develope a fever above 101 F, pus (white drainage) or increased drainage or redness at the wound, or calf pain, call your surgeon's office.   Constipation Prevention      Comments:   Drink plenty of fluids.  Prune juice may be helpful.  You may use a stool softener, such as Colace (over the counter) 100 mg twice a day.  Use MiraLax (over the counter) for constipation as needed.   Discharge instructions      Comments:   Keep your sling on at all times, including sleeping in your sling.  The only time you should remove your sling is to shower only but you need to keep your hand against your chest while you shower.   For the first few days, remove your dressing, tape a piece of saran wrap over your incision, take your shower, then remove the saran wrap and put a clean dressing on, then reapply your sling.  After two days you can shower without the saran wrap.   Call Dr. Darrelyn Hillock if any wound complications or temperature of 101 degrees F or over.   Call the office for an appointment to see Dr. Darrelyn Hillock in two weeks: [phone] and ask for Dr. Jeannetta Ellis nurse, Mackey Birchwood.   Driving restrictions      Comments:   No driving    Lifting restrictions      Comments:   No lifting     Medication List  As of 05/23/2011  7:18 AM   STOP taking these medications         acetaminophen 500 MG tablet         TAKE these medications         albuterol 108 (90 BASE) MCG/ACT inhaler   Commonly known as: PROVENTIL HFA;VENTOLIN HFA   Inhale 2 puffs into the lungs every 6 (six) hours as needed. Wheezing        azelastine 137 MCG/SPRAY nasal spray   Commonly known as: ASTELIN   Place 1 spray into the nose every morning. Use in each nostril as directed      Calcium Carb-Cholecalciferol 600-500 MG-UNIT Caps   Take 0.5 tablets by mouth 2 (two) times daily.      candesartan 16 MG tablet   Commonly known as: ATACAND   Take 8 mg by mouth daily after breakfast.      Coenzyme Q10 200 MG capsule   Take 200 mg by mouth daily.      estrogens (conjugated) 0.625 MG tablet   Commonly known as: PREMARIN   Take 0.625 mg by mouth daily. Skip days 1-5.      latanoprost 0.005 % ophthalmic solution   Commonly known as: XALATAN   Place 1 drop into both eyes at bedtime.      methocarbamol 500 MG tablet   Commonly known as: ROBAXIN   Take 1 tablet (500 mg total) by mouth every 6 (six) hours as needed.      mometasone 220 MCG/INH inhaler   Commonly known as: ASMANEX   Inhale 1 puff into the lungs daily.      mometasone 50 MCG/ACT nasal spray   Commonly known as: NASONEX   Place 1 spray into the nose daily.      montelukast 10  MG tablet   Commonly known as: SINGULAIR   Take 10 mg by mouth at bedtime.      mulitivitamin with minerals Tabs   Take 1 tablet by mouth daily.      naproxen sodium 220 MG tablet   Commonly known as: ANAPROX   Take 220 mg by mouth 2 (two) times daily with a meal.      omega-3 acid ethyl esters 1 G capsule   Commonly known as: LOVAZA   Take 1 g by mouth 2 (two) times daily.      oxyCODONE-acetaminophen 5-325 MG per tablet   Commonly known as: PERCOCET   Take 1-2 tablets by mouth every 4 (four)  hours as needed.      RABEprazole 20 MG tablet   Commonly known as: ACIPHEX   Take 20-40 mg by mouth daily.      ranitidine 300 MG tablet   Commonly known as: ZANTAC   Take 300 mg by mouth every evening.      rosuvastatin 10 MG tablet   Commonly known as: CRESTOR   Take 10 mg by mouth 2 (two) times a week. 1 tablet on Wednesday and Sunday only               Signed: Leny Morozov LAUREN 05/23/2011, 7:18 AM

## 2011-05-24 DIAGNOSIS — S46819A Strain of other muscles, fascia and tendons at shoulder and upper arm level, unspecified arm, initial encounter: Secondary | ICD-10-CM | POA: Diagnosis not present

## 2011-05-24 DIAGNOSIS — M5030 Other cervical disc degeneration, unspecified cervical region: Secondary | ICD-10-CM | POA: Diagnosis not present

## 2011-05-24 DIAGNOSIS — Z9889 Other specified postprocedural states: Secondary | ICD-10-CM | POA: Diagnosis not present

## 2011-05-24 DIAGNOSIS — M75100 Unspecified rotator cuff tear or rupture of unspecified shoulder, not specified as traumatic: Secondary | ICD-10-CM | POA: Diagnosis not present

## 2011-05-24 DIAGNOSIS — M67919 Unspecified disorder of synovium and tendon, unspecified shoulder: Secondary | ICD-10-CM | POA: Diagnosis not present

## 2011-05-24 DIAGNOSIS — I10 Essential (primary) hypertension: Secondary | ICD-10-CM | POA: Diagnosis not present

## 2011-05-24 DIAGNOSIS — M75120 Complete rotator cuff tear or rupture of unspecified shoulder, not specified as traumatic: Secondary | ICD-10-CM | POA: Diagnosis not present

## 2011-05-24 DIAGNOSIS — M25569 Pain in unspecified knee: Secondary | ICD-10-CM | POA: Diagnosis not present

## 2011-05-24 DIAGNOSIS — R279 Unspecified lack of coordination: Secondary | ICD-10-CM | POA: Diagnosis not present

## 2011-05-24 DIAGNOSIS — M25519 Pain in unspecified shoulder: Secondary | ICD-10-CM | POA: Diagnosis not present

## 2011-05-24 DIAGNOSIS — S43499A Other sprain of unspecified shoulder joint, initial encounter: Secondary | ICD-10-CM | POA: Diagnosis not present

## 2011-05-24 DIAGNOSIS — M7710 Lateral epicondylitis, unspecified elbow: Secondary | ICD-10-CM | POA: Diagnosis not present

## 2011-05-24 LAB — GLUCOSE, CAPILLARY
Glucose-Capillary: 126 mg/dL — ABNORMAL HIGH (ref 70–99)
Glucose-Capillary: 131 mg/dL — ABNORMAL HIGH (ref 70–99)

## 2011-05-24 NOTE — Progress Notes (Signed)
Subjective: 2 Days Post-Op Procedure(s) (LRB): ROTATOR CUFF REPAIR SHOULDER OPEN (Left) Patient reports pain as mild.   Patient seen in rounds with Dr. Darrelyn Hillock. Patient has complaints of mild pain and confusion this morning. She reports that when she woke up this morning she was very confused, not knowing where she was. She reports that after she was awake a few minutes she was no longer confused. She was not confused during rounds. Pt spoke with social worker yesterday and SNF arranged for today. Pt denies shortness of breath and chest pain. She reports mild pain in the left shoulder but better than yesterday.  Objective: Vital signs in last 24 hours: Temp:  [97.7 F (36.5 C)-98.1 F (36.7 C)] 98.1 F (36.7 C) (02/22 0531) Pulse Rate:  [87-93] 87  (02/22 0531) Resp:  [16] 16  (02/22 0531) BP: (136-159)/(71-87) 147/74 mmHg (02/22 0531) SpO2:  [92 %-98 %] 97 % (02/22 0531)  Intake/Output from previous day:  Intake/Output Summary (Last 24 hours) at 05/24/11 1004 Last data filed at 05/24/11 4098  Gross per 24 hour  Intake 652.33 ml  Output   2100 ml  Net -1447.67 ml     Exam - Neurologically intact Neurovascular intact Sensation intact distally No cellulitis present Dressing/Incision - clean, dry, no drainage Motor function intact - moving hand and fingers well  Past Medical History  Diagnosis Date  . Hypertension   . GERD (gastroesophageal reflux disease)   . Pneumonia 12-15 yrs.ago    yrs. ago  . Asthma   . Diabetes mellitus     borderline and under control-diet ,exercise    Assessment/Plan: 2 Days Post-Op Procedure(s) (LRB): ROTATOR CUFF REPAIR SHOULDER OPEN (Left) S/p open left rotator cuff repair  Advance diet D/C IV fluids Discharge to SNF  Keep sling on at all times  Alex Leahy LAUREN 05/24/2011, 10:04 AM

## 2011-05-24 NOTE — Progress Notes (Signed)
Pt d/c to [facility] via [facility] transport 05/24/11 for ST SNF placement. Pt was aware placement is not covered by medicare.

## 2011-05-25 DIAGNOSIS — M75100 Unspecified rotator cuff tear or rupture of unspecified shoulder, not specified as traumatic: Secondary | ICD-10-CM | POA: Diagnosis not present

## 2011-05-25 DIAGNOSIS — M75120 Complete rotator cuff tear or rupture of unspecified shoulder, not specified as traumatic: Secondary | ICD-10-CM | POA: Diagnosis not present

## 2011-05-25 DIAGNOSIS — R279 Unspecified lack of coordination: Secondary | ICD-10-CM | POA: Diagnosis not present

## 2011-05-25 DIAGNOSIS — M25569 Pain in unspecified knee: Secondary | ICD-10-CM | POA: Diagnosis not present

## 2011-05-26 DIAGNOSIS — I10 Essential (primary) hypertension: Secondary | ICD-10-CM | POA: Diagnosis not present

## 2011-05-26 DIAGNOSIS — S43429A Sprain of unspecified rotator cuff capsule, initial encounter: Secondary | ICD-10-CM | POA: Diagnosis not present

## 2011-05-26 DIAGNOSIS — E119 Type 2 diabetes mellitus without complications: Secondary | ICD-10-CM | POA: Diagnosis not present

## 2011-05-27 DIAGNOSIS — M25569 Pain in unspecified knee: Secondary | ICD-10-CM | POA: Diagnosis not present

## 2011-05-27 DIAGNOSIS — M75120 Complete rotator cuff tear or rupture of unspecified shoulder, not specified as traumatic: Secondary | ICD-10-CM | POA: Diagnosis not present

## 2011-05-27 DIAGNOSIS — R279 Unspecified lack of coordination: Secondary | ICD-10-CM | POA: Diagnosis not present

## 2011-05-27 DIAGNOSIS — M75100 Unspecified rotator cuff tear or rupture of unspecified shoulder, not specified as traumatic: Secondary | ICD-10-CM | POA: Diagnosis not present

## 2011-05-28 DIAGNOSIS — R279 Unspecified lack of coordination: Secondary | ICD-10-CM | POA: Diagnosis not present

## 2011-05-28 DIAGNOSIS — M25569 Pain in unspecified knee: Secondary | ICD-10-CM | POA: Diagnosis not present

## 2011-05-28 DIAGNOSIS — M75100 Unspecified rotator cuff tear or rupture of unspecified shoulder, not specified as traumatic: Secondary | ICD-10-CM | POA: Diagnosis not present

## 2011-05-28 DIAGNOSIS — M75120 Complete rotator cuff tear or rupture of unspecified shoulder, not specified as traumatic: Secondary | ICD-10-CM | POA: Diagnosis not present

## 2011-05-29 DIAGNOSIS — R279 Unspecified lack of coordination: Secondary | ICD-10-CM | POA: Diagnosis not present

## 2011-05-29 DIAGNOSIS — M75100 Unspecified rotator cuff tear or rupture of unspecified shoulder, not specified as traumatic: Secondary | ICD-10-CM | POA: Diagnosis not present

## 2011-05-29 DIAGNOSIS — M25569 Pain in unspecified knee: Secondary | ICD-10-CM | POA: Diagnosis not present

## 2011-05-29 DIAGNOSIS — M75120 Complete rotator cuff tear or rupture of unspecified shoulder, not specified as traumatic: Secondary | ICD-10-CM | POA: Diagnosis not present

## 2011-05-30 DIAGNOSIS — M75120 Complete rotator cuff tear or rupture of unspecified shoulder, not specified as traumatic: Secondary | ICD-10-CM | POA: Diagnosis not present

## 2011-05-30 DIAGNOSIS — M75100 Unspecified rotator cuff tear or rupture of unspecified shoulder, not specified as traumatic: Secondary | ICD-10-CM | POA: Diagnosis not present

## 2011-05-30 DIAGNOSIS — R279 Unspecified lack of coordination: Secondary | ICD-10-CM | POA: Diagnosis not present

## 2011-05-30 DIAGNOSIS — M25569 Pain in unspecified knee: Secondary | ICD-10-CM | POA: Diagnosis not present

## 2011-05-31 DIAGNOSIS — M75100 Unspecified rotator cuff tear or rupture of unspecified shoulder, not specified as traumatic: Secondary | ICD-10-CM | POA: Diagnosis not present

## 2011-05-31 DIAGNOSIS — M75120 Complete rotator cuff tear or rupture of unspecified shoulder, not specified as traumatic: Secondary | ICD-10-CM | POA: Diagnosis not present

## 2011-05-31 DIAGNOSIS — R279 Unspecified lack of coordination: Secondary | ICD-10-CM | POA: Diagnosis not present

## 2011-05-31 DIAGNOSIS — M25569 Pain in unspecified knee: Secondary | ICD-10-CM | POA: Diagnosis not present

## 2011-06-03 ENCOUNTER — Encounter (HOSPITAL_COMMUNITY): Payer: Self-pay | Admitting: Orthopedic Surgery

## 2011-06-03 DIAGNOSIS — M75100 Unspecified rotator cuff tear or rupture of unspecified shoulder, not specified as traumatic: Secondary | ICD-10-CM | POA: Diagnosis not present

## 2011-06-03 DIAGNOSIS — R279 Unspecified lack of coordination: Secondary | ICD-10-CM | POA: Diagnosis not present

## 2011-06-03 DIAGNOSIS — M25569 Pain in unspecified knee: Secondary | ICD-10-CM | POA: Diagnosis not present

## 2011-06-03 DIAGNOSIS — M75120 Complete rotator cuff tear or rupture of unspecified shoulder, not specified as traumatic: Secondary | ICD-10-CM | POA: Diagnosis not present

## 2011-06-04 DIAGNOSIS — M25569 Pain in unspecified knee: Secondary | ICD-10-CM | POA: Diagnosis not present

## 2011-06-04 DIAGNOSIS — R279 Unspecified lack of coordination: Secondary | ICD-10-CM | POA: Diagnosis not present

## 2011-06-04 DIAGNOSIS — M75100 Unspecified rotator cuff tear or rupture of unspecified shoulder, not specified as traumatic: Secondary | ICD-10-CM | POA: Diagnosis not present

## 2011-06-04 DIAGNOSIS — M75120 Complete rotator cuff tear or rupture of unspecified shoulder, not specified as traumatic: Secondary | ICD-10-CM | POA: Diagnosis not present

## 2011-06-05 DIAGNOSIS — I10 Essential (primary) hypertension: Secondary | ICD-10-CM | POA: Diagnosis not present

## 2011-06-05 DIAGNOSIS — M75100 Unspecified rotator cuff tear or rupture of unspecified shoulder, not specified as traumatic: Secondary | ICD-10-CM | POA: Diagnosis not present

## 2011-06-05 DIAGNOSIS — M75120 Complete rotator cuff tear or rupture of unspecified shoulder, not specified as traumatic: Secondary | ICD-10-CM | POA: Diagnosis not present

## 2011-06-05 DIAGNOSIS — E119 Type 2 diabetes mellitus without complications: Secondary | ICD-10-CM | POA: Diagnosis not present

## 2011-06-05 DIAGNOSIS — M25569 Pain in unspecified knee: Secondary | ICD-10-CM | POA: Diagnosis not present

## 2011-06-05 DIAGNOSIS — R279 Unspecified lack of coordination: Secondary | ICD-10-CM | POA: Diagnosis not present

## 2011-06-06 DIAGNOSIS — M75100 Unspecified rotator cuff tear or rupture of unspecified shoulder, not specified as traumatic: Secondary | ICD-10-CM | POA: Diagnosis not present

## 2011-06-06 DIAGNOSIS — M25569 Pain in unspecified knee: Secondary | ICD-10-CM | POA: Diagnosis not present

## 2011-06-06 DIAGNOSIS — M75120 Complete rotator cuff tear or rupture of unspecified shoulder, not specified as traumatic: Secondary | ICD-10-CM | POA: Diagnosis not present

## 2011-06-06 DIAGNOSIS — R279 Unspecified lack of coordination: Secondary | ICD-10-CM | POA: Diagnosis not present

## 2011-06-07 DIAGNOSIS — M75100 Unspecified rotator cuff tear or rupture of unspecified shoulder, not specified as traumatic: Secondary | ICD-10-CM | POA: Diagnosis not present

## 2011-06-07 DIAGNOSIS — M25569 Pain in unspecified knee: Secondary | ICD-10-CM | POA: Diagnosis not present

## 2011-06-07 DIAGNOSIS — R279 Unspecified lack of coordination: Secondary | ICD-10-CM | POA: Diagnosis not present

## 2011-06-07 DIAGNOSIS — M75120 Complete rotator cuff tear or rupture of unspecified shoulder, not specified as traumatic: Secondary | ICD-10-CM | POA: Diagnosis not present

## 2011-06-12 DIAGNOSIS — Z5189 Encounter for other specified aftercare: Secondary | ICD-10-CM | POA: Diagnosis not present

## 2011-06-12 DIAGNOSIS — R269 Unspecified abnormalities of gait and mobility: Secondary | ICD-10-CM | POA: Diagnosis not present

## 2011-06-12 DIAGNOSIS — Z4789 Encounter for other orthopedic aftercare: Secondary | ICD-10-CM | POA: Diagnosis not present

## 2011-06-12 DIAGNOSIS — J45909 Unspecified asthma, uncomplicated: Secondary | ICD-10-CM | POA: Diagnosis not present

## 2011-06-12 DIAGNOSIS — I10 Essential (primary) hypertension: Secondary | ICD-10-CM | POA: Diagnosis not present

## 2011-06-18 DIAGNOSIS — I10 Essential (primary) hypertension: Secondary | ICD-10-CM | POA: Diagnosis not present

## 2011-06-18 DIAGNOSIS — E119 Type 2 diabetes mellitus without complications: Secondary | ICD-10-CM | POA: Diagnosis not present

## 2011-06-18 DIAGNOSIS — Z79899 Other long term (current) drug therapy: Secondary | ICD-10-CM | POA: Diagnosis not present

## 2011-06-18 DIAGNOSIS — E7800 Pure hypercholesterolemia, unspecified: Secondary | ICD-10-CM | POA: Diagnosis not present

## 2011-07-08 DIAGNOSIS — M75120 Complete rotator cuff tear or rupture of unspecified shoulder, not specified as traumatic: Secondary | ICD-10-CM | POA: Diagnosis not present

## 2011-08-05 ENCOUNTER — Other Ambulatory Visit: Payer: Self-pay | Admitting: Geriatric Medicine

## 2011-08-05 DIAGNOSIS — Z1231 Encounter for screening mammogram for malignant neoplasm of breast: Secondary | ICD-10-CM

## 2011-08-12 DIAGNOSIS — M75120 Complete rotator cuff tear or rupture of unspecified shoulder, not specified as traumatic: Secondary | ICD-10-CM | POA: Diagnosis not present

## 2011-08-14 DIAGNOSIS — M75120 Complete rotator cuff tear or rupture of unspecified shoulder, not specified as traumatic: Secondary | ICD-10-CM | POA: Diagnosis not present

## 2011-08-19 DIAGNOSIS — M75120 Complete rotator cuff tear or rupture of unspecified shoulder, not specified as traumatic: Secondary | ICD-10-CM | POA: Diagnosis not present

## 2011-08-22 DIAGNOSIS — M75120 Complete rotator cuff tear or rupture of unspecified shoulder, not specified as traumatic: Secondary | ICD-10-CM | POA: Diagnosis not present

## 2011-08-27 DIAGNOSIS — M75120 Complete rotator cuff tear or rupture of unspecified shoulder, not specified as traumatic: Secondary | ICD-10-CM | POA: Diagnosis not present

## 2011-08-29 DIAGNOSIS — M75120 Complete rotator cuff tear or rupture of unspecified shoulder, not specified as traumatic: Secondary | ICD-10-CM | POA: Diagnosis not present

## 2011-09-02 DIAGNOSIS — M75120 Complete rotator cuff tear or rupture of unspecified shoulder, not specified as traumatic: Secondary | ICD-10-CM | POA: Diagnosis not present

## 2011-09-03 ENCOUNTER — Other Ambulatory Visit: Payer: Self-pay | Admitting: Dermatology

## 2011-09-03 DIAGNOSIS — L578 Other skin changes due to chronic exposure to nonionizing radiation: Secondary | ICD-10-CM | POA: Diagnosis not present

## 2011-09-03 DIAGNOSIS — L821 Other seborrheic keratosis: Secondary | ICD-10-CM | POA: Diagnosis not present

## 2011-09-03 DIAGNOSIS — B079 Viral wart, unspecified: Secondary | ICD-10-CM | POA: Diagnosis not present

## 2011-09-05 DIAGNOSIS — M75120 Complete rotator cuff tear or rupture of unspecified shoulder, not specified as traumatic: Secondary | ICD-10-CM | POA: Diagnosis not present

## 2011-09-09 DIAGNOSIS — M75120 Complete rotator cuff tear or rupture of unspecified shoulder, not specified as traumatic: Secondary | ICD-10-CM | POA: Diagnosis not present

## 2011-09-10 ENCOUNTER — Ambulatory Visit
Admission: RE | Admit: 2011-09-10 | Discharge: 2011-09-10 | Disposition: A | Discharge status: Home / Self Care | Payer: Medicare Other | Source: Ambulatory Visit | Attending: Geriatric Medicine | Admitting: Geriatric Medicine

## 2011-09-10 DIAGNOSIS — Z1231 Encounter for screening mammogram for malignant neoplasm of breast: Secondary | ICD-10-CM

## 2011-09-12 DIAGNOSIS — M75120 Complete rotator cuff tear or rupture of unspecified shoulder, not specified as traumatic: Secondary | ICD-10-CM | POA: Diagnosis not present

## 2011-09-16 DIAGNOSIS — M75120 Complete rotator cuff tear or rupture of unspecified shoulder, not specified as traumatic: Secondary | ICD-10-CM | POA: Diagnosis not present

## 2011-09-17 DIAGNOSIS — J309 Allergic rhinitis, unspecified: Secondary | ICD-10-CM | POA: Diagnosis not present

## 2011-09-17 DIAGNOSIS — J45909 Unspecified asthma, uncomplicated: Secondary | ICD-10-CM | POA: Diagnosis not present

## 2011-09-19 DIAGNOSIS — M75120 Complete rotator cuff tear or rupture of unspecified shoulder, not specified as traumatic: Secondary | ICD-10-CM | POA: Diagnosis not present

## 2011-09-24 DIAGNOSIS — M75120 Complete rotator cuff tear or rupture of unspecified shoulder, not specified as traumatic: Secondary | ICD-10-CM | POA: Diagnosis not present

## 2011-09-26 DIAGNOSIS — M75120 Complete rotator cuff tear or rupture of unspecified shoulder, not specified as traumatic: Secondary | ICD-10-CM | POA: Diagnosis not present

## 2011-10-07 DIAGNOSIS — H40019 Open angle with borderline findings, low risk, unspecified eye: Secondary | ICD-10-CM | POA: Diagnosis not present

## 2011-12-11 DIAGNOSIS — Z23 Encounter for immunization: Secondary | ICD-10-CM | POA: Diagnosis not present

## 2011-12-17 DIAGNOSIS — Z1331 Encounter for screening for depression: Secondary | ICD-10-CM | POA: Diagnosis not present

## 2011-12-17 DIAGNOSIS — R42 Dizziness and giddiness: Secondary | ICD-10-CM | POA: Diagnosis not present

## 2011-12-17 DIAGNOSIS — Z0000 Encounter for general adult medical examination without abnormal findings: Secondary | ICD-10-CM | POA: Diagnosis not present

## 2011-12-17 DIAGNOSIS — R259 Unspecified abnormal involuntary movements: Secondary | ICD-10-CM | POA: Diagnosis not present

## 2011-12-17 DIAGNOSIS — Z23 Encounter for immunization: Secondary | ICD-10-CM | POA: Diagnosis not present

## 2012-04-01 HISTORY — PX: CATARACT EXTRACTION: SUR2

## 2012-04-14 DIAGNOSIS — K149 Disease of tongue, unspecified: Secondary | ICD-10-CM | POA: Diagnosis not present

## 2012-04-14 DIAGNOSIS — I10 Essential (primary) hypertension: Secondary | ICD-10-CM | POA: Diagnosis not present

## 2012-04-14 DIAGNOSIS — E7800 Pure hypercholesterolemia, unspecified: Secondary | ICD-10-CM | POA: Diagnosis not present

## 2012-04-14 DIAGNOSIS — L821 Other seborrheic keratosis: Secondary | ICD-10-CM | POA: Diagnosis not present

## 2012-04-14 DIAGNOSIS — Z79899 Other long term (current) drug therapy: Secondary | ICD-10-CM | POA: Diagnosis not present

## 2012-04-14 DIAGNOSIS — R259 Unspecified abnormal involuntary movements: Secondary | ICD-10-CM | POA: Diagnosis not present

## 2012-04-14 DIAGNOSIS — E119 Type 2 diabetes mellitus without complications: Secondary | ICD-10-CM | POA: Diagnosis not present

## 2012-04-17 DIAGNOSIS — Z01419 Encounter for gynecological examination (general) (routine) without abnormal findings: Secondary | ICD-10-CM | POA: Diagnosis not present

## 2012-04-17 DIAGNOSIS — N951 Menopausal and female climacteric states: Secondary | ICD-10-CM | POA: Diagnosis not present

## 2012-04-20 ENCOUNTER — Other Ambulatory Visit: Payer: Self-pay | Admitting: Otolaryngology

## 2012-04-20 DIAGNOSIS — D101 Benign neoplasm of tongue: Secondary | ICD-10-CM | POA: Diagnosis not present

## 2012-04-20 DIAGNOSIS — K140 Glossitis: Secondary | ICD-10-CM | POA: Diagnosis not present

## 2012-04-21 DIAGNOSIS — J309 Allergic rhinitis, unspecified: Secondary | ICD-10-CM | POA: Diagnosis not present

## 2012-04-21 DIAGNOSIS — J45909 Unspecified asthma, uncomplicated: Secondary | ICD-10-CM | POA: Diagnosis not present

## 2012-05-19 DIAGNOSIS — D101 Benign neoplasm of tongue: Secondary | ICD-10-CM | POA: Diagnosis not present

## 2012-05-26 DIAGNOSIS — J309 Allergic rhinitis, unspecified: Secondary | ICD-10-CM | POA: Diagnosis not present

## 2012-05-26 DIAGNOSIS — J45909 Unspecified asthma, uncomplicated: Secondary | ICD-10-CM | POA: Diagnosis not present

## 2012-06-30 DIAGNOSIS — M7710 Lateral epicondylitis, unspecified elbow: Secondary | ICD-10-CM | POA: Diagnosis not present

## 2012-06-30 DIAGNOSIS — K140 Glossitis: Secondary | ICD-10-CM | POA: Diagnosis not present

## 2012-09-11 DIAGNOSIS — H402290 Chronic angle-closure glaucoma, unspecified eye, stage unspecified: Secondary | ICD-10-CM | POA: Diagnosis not present

## 2012-09-11 DIAGNOSIS — H409 Unspecified glaucoma: Secondary | ICD-10-CM | POA: Diagnosis not present

## 2012-09-21 DIAGNOSIS — H2589 Other age-related cataract: Secondary | ICD-10-CM | POA: Diagnosis not present

## 2012-09-21 DIAGNOSIS — H526 Other disorders of refraction: Secondary | ICD-10-CM | POA: Diagnosis not present

## 2012-09-21 DIAGNOSIS — H43819 Vitreous degeneration, unspecified eye: Secondary | ICD-10-CM | POA: Diagnosis not present

## 2012-09-21 DIAGNOSIS — H40039 Anatomical narrow angle, unspecified eye: Secondary | ICD-10-CM | POA: Diagnosis not present

## 2012-10-13 DIAGNOSIS — J45909 Unspecified asthma, uncomplicated: Secondary | ICD-10-CM | POA: Diagnosis not present

## 2012-10-13 DIAGNOSIS — J309 Allergic rhinitis, unspecified: Secondary | ICD-10-CM | POA: Diagnosis not present

## 2012-12-30 DIAGNOSIS — Z23 Encounter for immunization: Secondary | ICD-10-CM | POA: Diagnosis not present

## 2013-01-07 DIAGNOSIS — R079 Chest pain, unspecified: Secondary | ICD-10-CM | POA: Diagnosis not present

## 2013-01-07 DIAGNOSIS — E7800 Pure hypercholesterolemia, unspecified: Secondary | ICD-10-CM | POA: Diagnosis not present

## 2013-01-07 DIAGNOSIS — Z0000 Encounter for general adult medical examination without abnormal findings: Secondary | ICD-10-CM | POA: Diagnosis not present

## 2013-01-07 DIAGNOSIS — Z1331 Encounter for screening for depression: Secondary | ICD-10-CM | POA: Diagnosis not present

## 2013-01-07 DIAGNOSIS — E119 Type 2 diabetes mellitus without complications: Secondary | ICD-10-CM | POA: Diagnosis not present

## 2013-01-07 DIAGNOSIS — Z79899 Other long term (current) drug therapy: Secondary | ICD-10-CM | POA: Diagnosis not present

## 2013-02-15 ENCOUNTER — Other Ambulatory Visit: Payer: Self-pay | Admitting: Internal Medicine

## 2013-02-15 ENCOUNTER — Ambulatory Visit
Admission: RE | Admit: 2013-02-15 | Discharge: 2013-02-15 | Disposition: A | Discharge status: Home / Self Care | Payer: Medicare Other | Source: Ambulatory Visit | Attending: Internal Medicine | Admitting: Internal Medicine

## 2013-02-15 DIAGNOSIS — R079 Chest pain, unspecified: Secondary | ICD-10-CM | POA: Diagnosis not present

## 2013-02-15 DIAGNOSIS — J189 Pneumonia, unspecified organism: Secondary | ICD-10-CM | POA: Diagnosis not present

## 2013-03-01 DIAGNOSIS — M899 Disorder of bone, unspecified: Secondary | ICD-10-CM | POA: Diagnosis not present

## 2013-03-03 DIAGNOSIS — H2510 Age-related nuclear cataract, unspecified eye: Secondary | ICD-10-CM | POA: Diagnosis not present

## 2013-03-03 DIAGNOSIS — Z01818 Encounter for other preprocedural examination: Secondary | ICD-10-CM | POA: Diagnosis not present

## 2013-03-03 DIAGNOSIS — H2589 Other age-related cataract: Secondary | ICD-10-CM | POA: Diagnosis not present

## 2013-03-03 DIAGNOSIS — H40039 Anatomical narrow angle, unspecified eye: Secondary | ICD-10-CM | POA: Diagnosis not present

## 2013-03-09 DIAGNOSIS — H269 Unspecified cataract: Secondary | ICD-10-CM | POA: Diagnosis not present

## 2013-03-09 DIAGNOSIS — H2510 Age-related nuclear cataract, unspecified eye: Secondary | ICD-10-CM | POA: Diagnosis not present

## 2013-03-09 DIAGNOSIS — H2589 Other age-related cataract: Secondary | ICD-10-CM | POA: Diagnosis not present

## 2013-03-29 ENCOUNTER — Ambulatory Visit
Admission: RE | Admit: 2013-03-29 | Discharge: 2013-03-29 | Disposition: A | Discharge status: Home / Self Care | Payer: Medicare Other | Source: Ambulatory Visit | Attending: Geriatric Medicine | Admitting: Geriatric Medicine

## 2013-03-29 ENCOUNTER — Other Ambulatory Visit: Payer: Self-pay | Admitting: Geriatric Medicine

## 2013-03-29 DIAGNOSIS — R413 Other amnesia: Secondary | ICD-10-CM | POA: Diagnosis not present

## 2013-03-29 DIAGNOSIS — J168 Pneumonia due to other specified infectious organisms: Secondary | ICD-10-CM | POA: Diagnosis not present

## 2013-03-29 DIAGNOSIS — J984 Other disorders of lung: Secondary | ICD-10-CM | POA: Diagnosis not present

## 2013-04-21 ENCOUNTER — Emergency Department (HOSPITAL_COMMUNITY)
Admission: EM | Admit: 2013-04-21 | Discharge: 2013-04-21 | Disposition: A | Discharge status: Home / Self Care | Payer: Medicare Other | Attending: Emergency Medicine | Admitting: Emergency Medicine

## 2013-04-21 ENCOUNTER — Encounter (HOSPITAL_COMMUNITY): Payer: Self-pay | Admitting: Emergency Medicine

## 2013-04-21 ENCOUNTER — Emergency Department (HOSPITAL_COMMUNITY): Payer: Medicare Other

## 2013-04-21 DIAGNOSIS — J45909 Unspecified asthma, uncomplicated: Secondary | ICD-10-CM | POA: Insufficient documentation

## 2013-04-21 DIAGNOSIS — IMO0002 Reserved for concepts with insufficient information to code with codable children: Secondary | ICD-10-CM | POA: Diagnosis not present

## 2013-04-21 DIAGNOSIS — R9431 Abnormal electrocardiogram [ECG] [EKG]: Secondary | ICD-10-CM | POA: Diagnosis not present

## 2013-04-21 DIAGNOSIS — R42 Dizziness and giddiness: Secondary | ICD-10-CM

## 2013-04-21 DIAGNOSIS — R269 Unspecified abnormalities of gait and mobility: Secondary | ICD-10-CM | POA: Diagnosis not present

## 2013-04-21 DIAGNOSIS — Z87891 Personal history of nicotine dependence: Secondary | ICD-10-CM | POA: Insufficient documentation

## 2013-04-21 DIAGNOSIS — Z79899 Other long term (current) drug therapy: Secondary | ICD-10-CM | POA: Insufficient documentation

## 2013-04-21 DIAGNOSIS — Q283 Other malformations of cerebral vessels: Secondary | ICD-10-CM | POA: Diagnosis not present

## 2013-04-21 DIAGNOSIS — J3489 Other specified disorders of nose and nasal sinuses: Secondary | ICD-10-CM | POA: Diagnosis not present

## 2013-04-21 DIAGNOSIS — K219 Gastro-esophageal reflux disease without esophagitis: Secondary | ICD-10-CM | POA: Diagnosis not present

## 2013-04-21 DIAGNOSIS — I6789 Other cerebrovascular disease: Secondary | ICD-10-CM | POA: Insufficient documentation

## 2013-04-21 DIAGNOSIS — Z8701 Personal history of pneumonia (recurrent): Secondary | ICD-10-CM | POA: Insufficient documentation

## 2013-04-21 DIAGNOSIS — I10 Essential (primary) hypertension: Secondary | ICD-10-CM | POA: Insufficient documentation

## 2013-04-21 DIAGNOSIS — R404 Transient alteration of awareness: Secondary | ICD-10-CM | POA: Diagnosis not present

## 2013-04-21 DIAGNOSIS — Z9089 Acquired absence of other organs: Secondary | ICD-10-CM | POA: Diagnosis not present

## 2013-04-21 LAB — CBC WITH DIFFERENTIAL/PLATELET
BASOS PCT: 0 % (ref 0–1)
Basophils Absolute: 0 10*3/uL (ref 0.0–0.1)
EOS ABS: 0.1 10*3/uL (ref 0.0–0.7)
Eosinophils Relative: 1 % (ref 0–5)
HEMATOCRIT: 37 % (ref 36.0–46.0)
HEMOGLOBIN: 12.4 g/dL (ref 12.0–15.0)
LYMPHS ABS: 1.9 10*3/uL (ref 0.7–4.0)
Lymphocytes Relative: 17 % (ref 12–46)
MCH: 29.7 pg (ref 26.0–34.0)
MCHC: 33.5 g/dL (ref 30.0–36.0)
MCV: 88.5 fL (ref 78.0–100.0)
MONO ABS: 1.1 10*3/uL — AB (ref 0.1–1.0)
MONOS PCT: 10 % (ref 3–12)
Neutro Abs: 7.9 10*3/uL — ABNORMAL HIGH (ref 1.7–7.7)
Neutrophils Relative %: 72 % (ref 43–77)
Platelets: 301 10*3/uL (ref 150–400)
RBC: 4.18 MIL/uL (ref 3.87–5.11)
RDW: 14.1 % (ref 11.5–15.5)
WBC: 11.1 10*3/uL — ABNORMAL HIGH (ref 4.0–10.5)

## 2013-04-21 LAB — POCT I-STAT, CHEM 8
BUN: 17 mg/dL (ref 6–23)
CALCIUM ION: 1.17 mmol/L (ref 1.13–1.30)
CREATININE: 0.7 mg/dL (ref 0.50–1.10)
Chloride: 102 mEq/L (ref 96–112)
GLUCOSE: 136 mg/dL — AB (ref 70–99)
HEMATOCRIT: 38 % (ref 36.0–46.0)
HEMOGLOBIN: 12.9 g/dL (ref 12.0–15.0)
Potassium: 5.6 mEq/L — ABNORMAL HIGH (ref 3.7–5.3)
Sodium: 139 mEq/L (ref 137–147)
TCO2: 29 mmol/L (ref 0–100)

## 2013-04-21 LAB — POCT I-STAT TROPONIN I: Troponin i, poc: 0 ng/mL (ref 0.00–0.08)

## 2013-04-21 MED ORDER — DIAZEPAM 5 MG PO TABS: 2.5000 mg | ORAL_TABLET | Freq: Three times a day (TID) | ORAL | Status: DC | PRN

## 2013-04-21 MED ORDER — MECLIZINE HCL 25 MG PO TABS
25.0000 mg | ORAL_TABLET | Freq: Once | ORAL | Status: AC
  Administered 2013-04-21: 25 mg via ORAL
  Filled 2013-04-21: qty 1

## 2013-04-21 MED ORDER — DIAZEPAM 5 MG PO TABS
2.5000 mg | ORAL_TABLET | Freq: Once | ORAL | Status: AC
  Administered 2013-04-21: 2.5 mg via ORAL
  Filled 2013-04-21: qty 1

## 2013-04-21 NOTE — ED Notes (Signed)
Pt wc to car pt alert x4 respirations easy non labored.

## 2013-04-21 NOTE — ED Notes (Signed)
Patient transported to MRI 

## 2013-04-21 NOTE — ED Notes (Signed)
Bed: WA17 Expected date:  Expected time:  Means of arrival:  Comments: EMS-dizzy-hypertension

## 2013-04-21 NOTE — ED Provider Notes (Addendum)
CSN: [id number]     Arrival date & time 04/21/13  1502 History   First MD Initiated Contact with Patient 04/21/13 1533     Chief Complaint  Patient presents with  . Dizziness   (Consider location/radiation/quality/duration/timing/severity/associated sxs/prior Treatment) Patient is a 80 y.o. female presenting with dizziness. The history is provided by the patient.  Dizziness Quality:  Head spinning and imbalance Severity:  Moderate Onset quality:  Gradual Duration:  12 hours (woke up yesterday with brief episode that resolved spontaneously and was well all day until woke up today.) Timing:  Constant Progression:  Unchanged Chronicity:  New Context comment:  Started when woke up and tried to get out of bed.  laid back down but did not resolve and has continued all day Relieved by:  Nothing Exacerbated by: seems worse with walking but unchanged with head or eye movement. Ineffective treatments:  Lying down and change in position Associated symptoms: no blood in stool, no chest pain, no diarrhea, no headaches, no hearing loss, no nausea, no palpitations, no shortness of breath, no syncope, no vision changes, no vomiting and no weakness   Risk factors: no hx of stroke, no hx of vertigo and no new medications     Past Medical History  Diagnosis Date  . Hypertension   . GERD (gastroesophageal reflux disease)   . Pneumonia 12-15 yrs.ago    yrs. ago  . Asthma   . Diabetes mellitus     borderline and under control-diet ,exercise   Past Surgical History  Procedure Laterality Date  . Tonsillectomy    . Abdominal hysterectomy    . Shoulder open rotator cuff repair  05/22/2011    Procedure: ROTATOR CUFF REPAIR SHOULDER OPEN;  Surgeon: Tobi Bastos, MD;  Location: [facility];  Service: Orthopedics;  Laterality: Left;   History reviewed. No pertinent family history. History  Substance Use Topics  . Smoking status: Former Smoker -- 1.50 packs/day for 20 years    Types: Cigarettes    Quit  date: 05/15/1987  . Smokeless tobacco: Not on file  . Alcohol Use: 0.6 oz/week    1 Glasses of wine per week     Comment: occassionally   OB History   Grav Para Term Preterm Abortions TAB SAB Ect Mult Living                 Review of Systems  Constitutional: Negative for fever.  HENT: Negative for hearing loss.   Respiratory: Negative for shortness of breath.   Cardiovascular: Negative for chest pain, palpitations and syncope.  Gastrointestinal: Negative for nausea, vomiting, diarrhea and blood in stool.  Neurological: Positive for dizziness. Negative for speech difficulty, weakness, light-headedness, numbness and headaches.  All other systems reviewed and are negative.    Allergies  Review of patient's allergies indicates no known allergies.  Home Medications   Current Outpatient Rx  Name  Route  Sig  Dispense  Refill  . azelastine (ASTELIN) 137 MCG/SPRAY nasal spray   Nasal   Place 1 spray into the nose every morning. Use in each nostril as directed         . calcium-vitamin D (OSCAL WITH D) 500-200 MG-UNIT per tablet   Oral   Take 1 tablet by mouth 2 (two) times daily.         . candesartan (ATACAND) 16 MG tablet   Oral   Take 8 mg by mouth daily after breakfast.         . Coenzyme Q10  200 MG capsule   Oral   Take 200 mg by mouth daily.         Marland Kitchen latanoprost (XALATAN) 0.005 % ophthalmic solution   Both Eyes   Place 1 drop into both eyes at bedtime.         . mometasone (ASMANEX) 220 MCG/INH inhaler   Inhalation   Inhale 1 puff into the lungs daily.         . mometasone (NASONEX) 50 MCG/ACT nasal spray   Nasal   Place 1 spray into the nose daily.         . montelukast (SINGULAIR) 10 MG tablet   Oral   Take 10 mg by mouth at bedtime.         . Multiple Vitamin (MULITIVITAMIN WITH MINERALS) TABS   Oral   Take 1 tablet by mouth daily.         . RABEprazole (ACIPHEX) 20 MG tablet   Oral   Take by mouth daily.          . ranitidine  (ZANTAC) 300 MG tablet   Oral   Take 300 mg by mouth every evening.          BP 161/66  Pulse 80  Temp(Src) 97.7 F (36.5 C) (Oral)  Resp 18  SpO2 100% Physical Exam  Nursing note and vitals reviewed. Constitutional: She is oriented to person, place, and time. She appears well-developed and well-nourished. No distress.  HENT:  Head: Normocephalic and atraumatic.  Mouth/Throat: Oropharynx is clear and moist.  Eyes: Conjunctivae and EOM are normal. Pupils are equal, round, and reactive to light.  Neck: Normal range of motion. Neck supple. No spinous process tenderness and no muscular tenderness present. Carotid bruit is not present.  Cardiovascular: Normal rate, regular rhythm and intact distal pulses.   No murmur heard. Pulmonary/Chest: Effort normal and breath sounds normal. No respiratory distress. She has no wheezes. She has no rales.  Abdominal: Soft. She exhibits no distension. There is no tenderness. There is no rebound and no guarding.  Musculoskeletal: Normal range of motion. She exhibits no edema and no tenderness.  Neurological: She is alert and oriented to person, place, and time. She has normal strength. No cranial nerve deficit or sensory deficit. Coordination normal.  No ocular gaze defect.  No visual field cuts.  Normal finger to nose testing and normal heel to shin testing bilaterally.  Pt currently states she is too dizzy to walk  Skin: Skin is warm and dry. No rash noted. No erythema.  Psychiatric: She has a normal mood and affect. Her behavior is normal.    ED Course  Procedures (including critical care time) Labs Review Labs Reviewed  CBC WITH DIFFERENTIAL - Abnormal; Notable for the following:    WBC 11.1 (*)    Neutro Abs 7.9 (*)    Monocytes Absolute 1.1 (*)    All other components within normal limits  POCT I-STAT, CHEM 8 - Abnormal; Notable for the following:    Potassium 5.6 (*)    Glucose, Bld 136 (*)    All other components within normal limits   POCT I-STAT TROPONIN I   Imaging Review Mr Angiogram Head Wo Contrast  04/21/2013   CLINICAL DATA:  Dizziness for 2 days. Rule out posterior circulation stroke.  EXAM: MRI HEAD WITHOUT CONTRAST  MRA HEAD WITHOUT CONTRAST  TECHNIQUE: Multiplanar, multiecho pulse sequences of the brain and surrounding structures were obtained without intravenous contrast. Angiographic images of the head were  obtained using MRA technique without contrast.  COMPARISON:  None.  FINDINGS: MRI HEAD FINDINGS  There is no evidence of acute infarct or intracranial hemorrhage. Scattered foci of T2 hyperintensity within the subcortical and deep cerebral white matter are nonspecific but compatible with mild chronic small vessel ischemic disease. There is no mass, midline shift, or extra-axial fluid collection. Ventricles and sulci are within normal limits for age. Major intracranial vascular flow voids are unremarkable. Prior left cataract surgery is noted. Small amount of left maxillary sinus mucosal thickening is noted.  MRA HEAD FINDINGS  Visualized distal vertebral arteries are patent and codominant. PICA origins are patent bilaterally. Basilar artery is patent without stenosis. SCA origins are patent. The left SCA is duplicated. PCAs are unremarkable. There is a small right posterior communicating artery. Internal carotid arteries are patent from skullbase to carotid termini. Anterior communicating artery is patent. ACAs and MCAs are unremarkable. No intracranial aneurysm is identified.  IMPRESSION: 1. No evidence of infarct or other acute intracranial abnormality. Mild chronic small vessel ischemic disease. 2. Unremarkable head MRA.   Electronically Signed   By: Logan Bores   On: 04/21/2013 17:29   Mr Brain Wo Contrast  04/21/2013   CLINICAL DATA:  Dizziness for 2 days. Rule out posterior circulation stroke.  EXAM: MRI HEAD WITHOUT CONTRAST  MRA HEAD WITHOUT CONTRAST  TECHNIQUE: Multiplanar, multiecho pulse sequences of the brain  and surrounding structures were obtained without intravenous contrast. Angiographic images of the head were obtained using MRA technique without contrast.  COMPARISON:  None.  FINDINGS: MRI HEAD FINDINGS  There is no evidence of acute infarct or intracranial hemorrhage. Scattered foci of T2 hyperintensity within the subcortical and deep cerebral white matter are nonspecific but compatible with mild chronic small vessel ischemic disease. There is no mass, midline shift, or extra-axial fluid collection. Ventricles and sulci are within normal limits for age. Major intracranial vascular flow voids are unremarkable. Prior left cataract surgery is noted. Small amount of left maxillary sinus mucosal thickening is noted.  MRA HEAD FINDINGS  Visualized distal vertebral arteries are patent and codominant. PICA origins are patent bilaterally. Basilar artery is patent without stenosis. SCA origins are patent. The left SCA is duplicated. PCAs are unremarkable. There is a small right posterior communicating artery. Internal carotid arteries are patent from skullbase to carotid termini. Anterior communicating artery is patent. ACAs and MCAs are unremarkable. No intracranial aneurysm is identified.  IMPRESSION: 1. No evidence of infarct or other acute intracranial abnormality. Mild chronic small vessel ischemic disease. 2. Unremarkable head MRA.   Electronically Signed   By: Logan Bores   On: 04/21/2013 17:29    EKG Interpretation    Date/Time:  Wednesday April 21 2013 15:02:58 EST Ventricular Rate:  82 PR Interval:  157 QRS Duration: 92 QT Interval:  425 QTC Calculation: 496 R Axis:   -44 Text Interpretation:  Sinus rhythm Abnormal R-wave progression, late transition Probable left ventricular hypertrophy Borderline prolonged QT interval No significant change since last tracing Confirmed by Maryan Rued  MD, Fendi Meinhardt ([id number]) on 04/21/2013 3:34:18 PM            MDM   1. Vertigo     Pt with sx most concerning  for cerebellar stroke vs possible peripheral vertigo. Denies any neck pain, headaches and low concern for carotid or vertebral dissection. No systemic or infectious sx.  Pt denies any associated sx such as nausea/vomiting or worse with head movement.  Normal neuro exam without weakness, ataxia or  cerebellar findings on exam.  Normal vision.  Sx are not reproducible with movement of the head.  No hx of Stroke but borderline DM and hx of HTN.   EKG without acute findings. CBC, i-STAT, troponin, MRI of the brain pending for further evaluation.  Patient given meclizine in the meantime to see if there is any improvement in symptoms.  6:18 PM Labs unremarkable and MRI without evidence of stroke.  Pt has some improvement with meclizine but still dizzy with walking but easily steadied and no ataxia.  Will treat with valium and pt to go home with family.  Family agrees they will stay with her and return for any worsening of sx.  Blanchie Dessert, MD 04/21/13 [place], MD 04/21/13 2376

## 2013-04-21 NOTE — ED Notes (Signed)
Pt return from MRI

## 2013-04-21 NOTE — ED Notes (Signed)
Pt c/o dizziness since yesterday. Symptoms resolved then returned. Denies any other symptoms at this time. Alert x4 MAE randomly. Ambulatory on scene without distress. Pt lives alone.

## 2013-04-21 NOTE — Discharge Instructions (Signed)

## 2013-05-11 DIAGNOSIS — R413 Other amnesia: Secondary | ICD-10-CM | POA: Diagnosis not present

## 2013-05-11 DIAGNOSIS — I10 Essential (primary) hypertension: Secondary | ICD-10-CM | POA: Diagnosis not present

## 2013-05-11 DIAGNOSIS — R5381 Other malaise: Secondary | ICD-10-CM | POA: Diagnosis not present

## 2013-05-11 DIAGNOSIS — R42 Dizziness and giddiness: Secondary | ICD-10-CM | POA: Diagnosis not present

## 2013-06-03 DIAGNOSIS — H1130 Conjunctival hemorrhage, unspecified eye: Secondary | ICD-10-CM | POA: Diagnosis not present

## 2013-06-11 DIAGNOSIS — M810 Age-related osteoporosis without current pathological fracture: Secondary | ICD-10-CM | POA: Diagnosis not present

## 2013-06-22 DIAGNOSIS — R413 Other amnesia: Secondary | ICD-10-CM | POA: Diagnosis not present

## 2013-06-22 DIAGNOSIS — F329 Major depressive disorder, single episode, unspecified: Secondary | ICD-10-CM | POA: Diagnosis not present

## 2013-08-02 DIAGNOSIS — I10 Essential (primary) hypertension: Secondary | ICD-10-CM | POA: Diagnosis not present

## 2013-08-02 DIAGNOSIS — M25519 Pain in unspecified shoulder: Secondary | ICD-10-CM | POA: Diagnosis not present

## 2013-08-02 DIAGNOSIS — E119 Type 2 diabetes mellitus without complications: Secondary | ICD-10-CM | POA: Diagnosis not present

## 2013-08-02 DIAGNOSIS — R413 Other amnesia: Secondary | ICD-10-CM | POA: Diagnosis not present

## 2013-09-21 DIAGNOSIS — F0280 Dementia in other diseases classified elsewhere without behavioral disturbance: Secondary | ICD-10-CM | POA: Diagnosis not present

## 2013-11-22 DIAGNOSIS — G479 Sleep disorder, unspecified: Secondary | ICD-10-CM | POA: Diagnosis not present

## 2013-11-22 DIAGNOSIS — G309 Alzheimer's disease, unspecified: Secondary | ICD-10-CM | POA: Diagnosis not present

## 2013-11-22 DIAGNOSIS — F0280 Dementia in other diseases classified elsewhere without behavioral disturbance: Secondary | ICD-10-CM | POA: Diagnosis not present

## 2013-11-22 DIAGNOSIS — E119 Type 2 diabetes mellitus without complications: Secondary | ICD-10-CM | POA: Diagnosis not present

## 2013-11-22 DIAGNOSIS — Z23 Encounter for immunization: Secondary | ICD-10-CM | POA: Diagnosis not present

## 2013-11-22 DIAGNOSIS — I10 Essential (primary) hypertension: Secondary | ICD-10-CM | POA: Diagnosis not present

## 2013-12-23 DIAGNOSIS — Z23 Encounter for immunization: Secondary | ICD-10-CM | POA: Diagnosis not present

## 2014-02-09 DIAGNOSIS — G301 Alzheimer's disease with late onset: Secondary | ICD-10-CM | POA: Diagnosis not present

## 2014-02-09 DIAGNOSIS — Z0000 Encounter for general adult medical examination without abnormal findings: Secondary | ICD-10-CM | POA: Diagnosis not present

## 2014-02-09 DIAGNOSIS — Z79899 Other long term (current) drug therapy: Secondary | ICD-10-CM | POA: Diagnosis not present

## 2014-02-09 DIAGNOSIS — I10 Essential (primary) hypertension: Secondary | ICD-10-CM | POA: Diagnosis not present

## 2014-02-09 DIAGNOSIS — E119 Type 2 diabetes mellitus without complications: Secondary | ICD-10-CM | POA: Diagnosis not present

## 2014-02-09 DIAGNOSIS — M8580 Other specified disorders of bone density and structure, unspecified site: Secondary | ICD-10-CM | POA: Diagnosis not present

## 2014-02-09 DIAGNOSIS — Z1389 Encounter for screening for other disorder: Secondary | ICD-10-CM | POA: Diagnosis not present

## 2014-02-09 DIAGNOSIS — E780 Pure hypercholesterolemia: Secondary | ICD-10-CM | POA: Diagnosis not present

## 2014-05-13 DIAGNOSIS — I10 Essential (primary) hypertension: Secondary | ICD-10-CM | POA: Diagnosis not present

## 2014-05-13 DIAGNOSIS — E119 Type 2 diabetes mellitus without complications: Secondary | ICD-10-CM | POA: Diagnosis not present

## 2014-05-13 DIAGNOSIS — G301 Alzheimer's disease with late onset: Secondary | ICD-10-CM | POA: Diagnosis not present

## 2014-08-21 IMAGING — CR DG CHEST 2V
2 series · 2 of 2 positions shown · non-contrast
Comparison: 02/15/2013 and 05/22/2010

CLINICAL DATA: Followup pneumonia

EXAM:
CHEST  2 VIEW

[w chest pa]
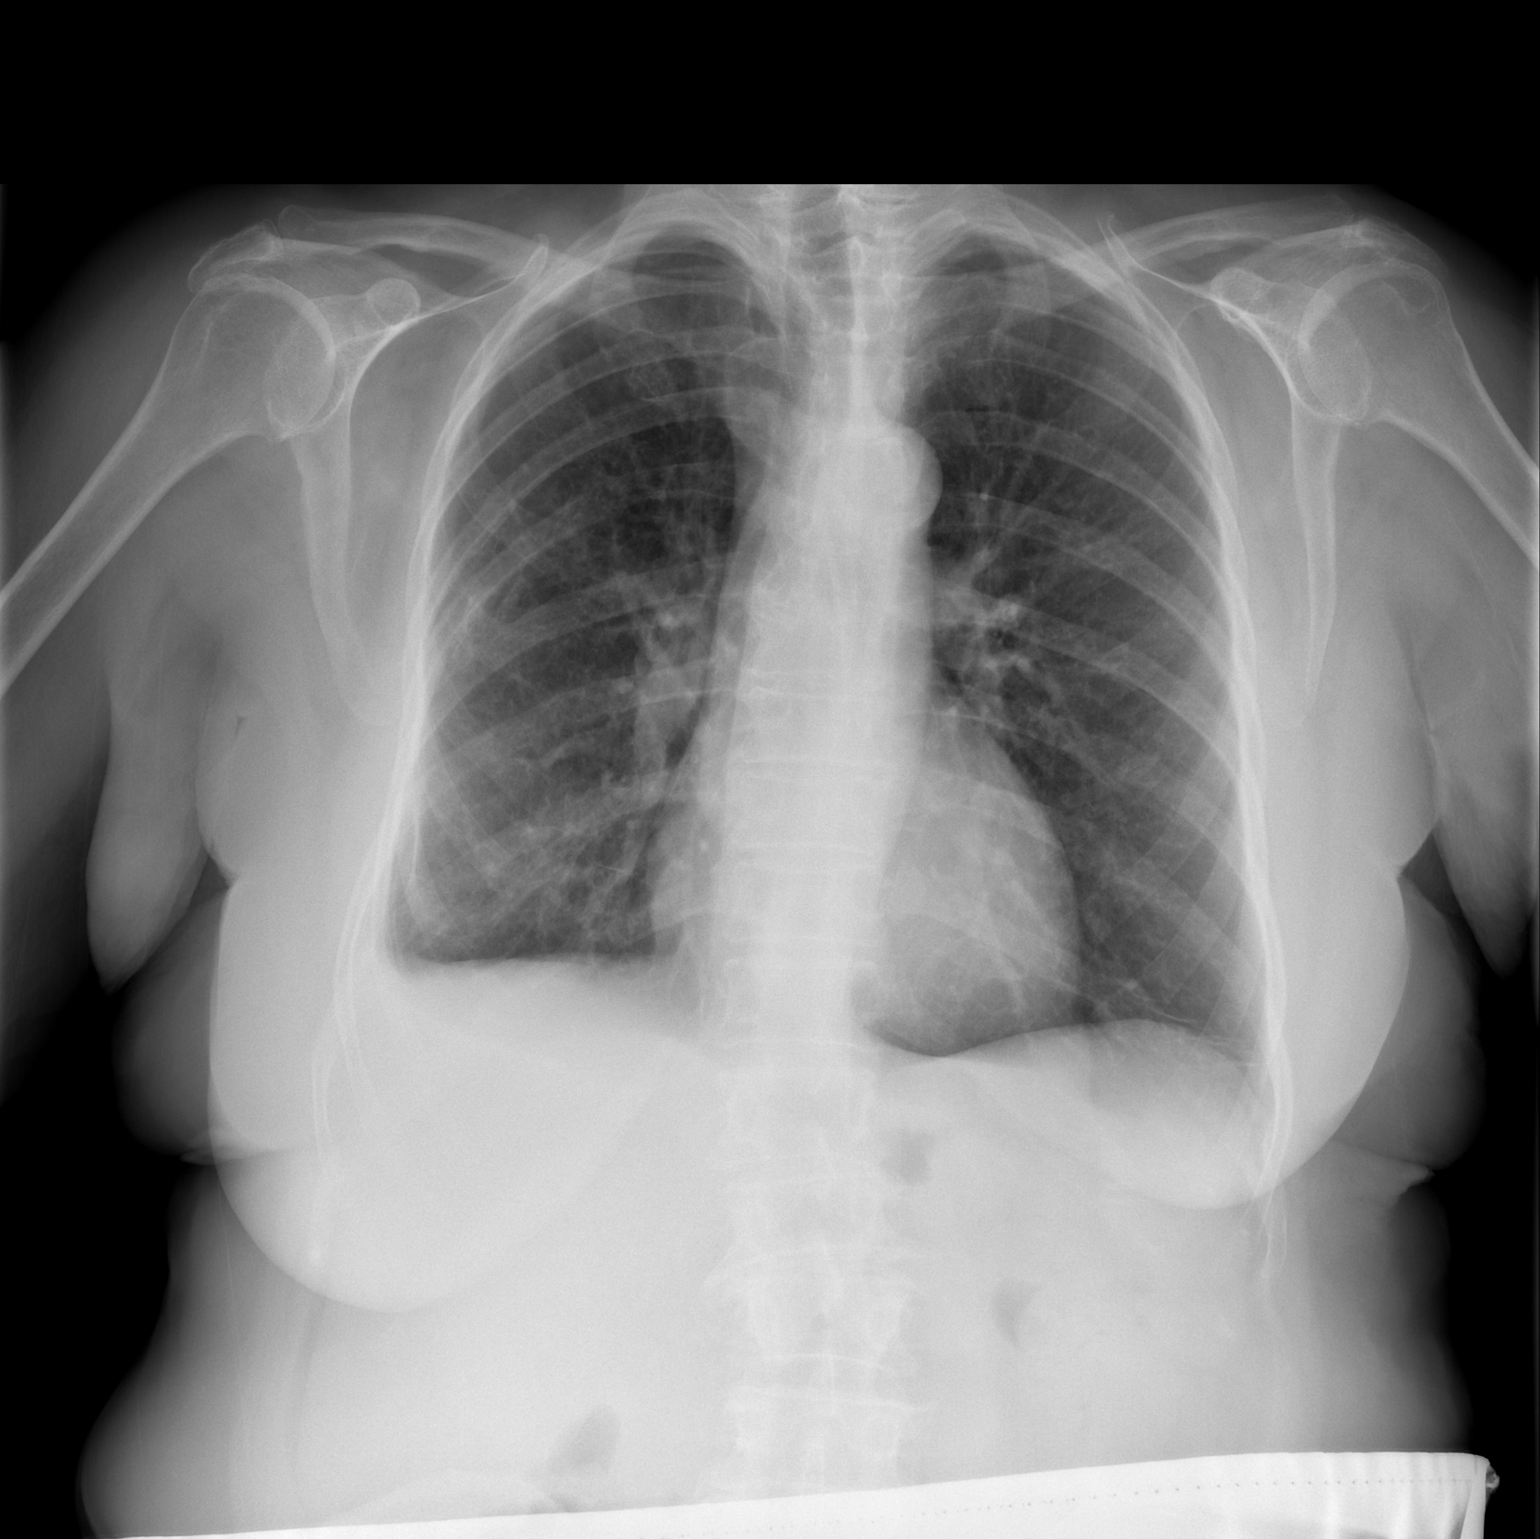

[w chest lat]
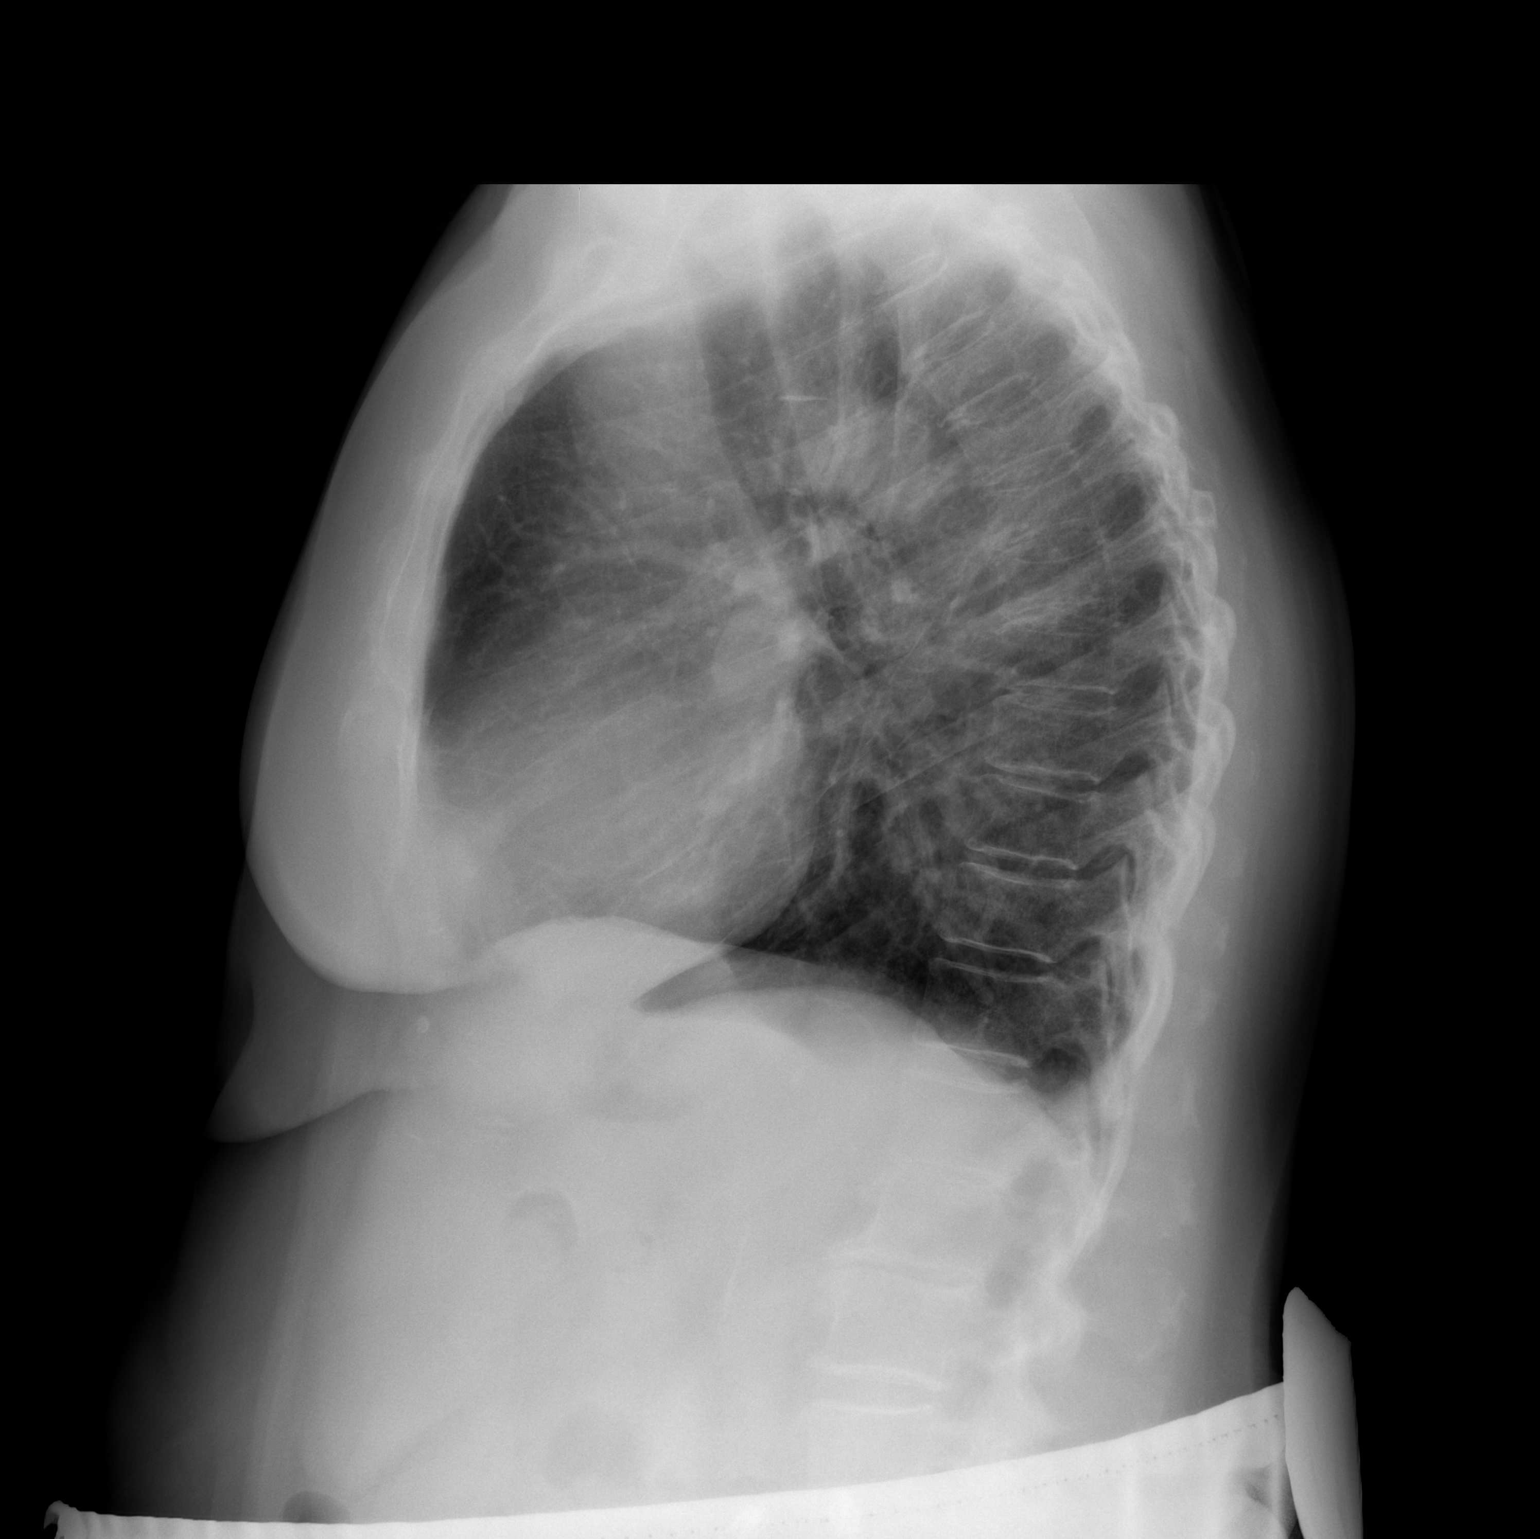

[2 of 2 positions shown; findings below may reference images not displayed]

FINDINGS: Cardiomediastinal silhouette is stable. No acute infiltrate or
pulmonary edema. There is improvement in aeration. Right basilar
pneumonia has cleared. There is blunting of the right costophrenic
angle probable due to scarring. Some scarring is noted in right
midlung. Left lung is clear. Bony thorax is stable.
IMPRESSION: No acute infiltrate or pulmonary edema. There is improvement in
aeration. Right basilar pneumonia has cleared. There is blunting of
the right costophrenic angle probable due to scarring. Some scarring
is noted in right midlung.

## 2015-02-28 ENCOUNTER — Encounter: Payer: Self-pay | Admitting: Internal Medicine

## 2015-03-07 ENCOUNTER — Non-Acute Institutional Stay: Payer: Medicare Other | Admitting: Internal Medicine

## 2015-03-07 ENCOUNTER — Encounter: Payer: Self-pay | Admitting: Internal Medicine

## 2015-03-07 VITALS — BP 142/78 | HR 84 | Temp 97.4°F | Resp 20 | Ht 60.0 in | Wt 148.8 lb

## 2015-03-07 DIAGNOSIS — E785 Hyperlipidemia, unspecified: Secondary | ICD-10-CM | POA: Diagnosis not present

## 2015-03-07 DIAGNOSIS — K222 Esophageal obstruction: Secondary | ICD-10-CM | POA: Diagnosis not present

## 2015-03-07 DIAGNOSIS — M8580 Other specified disorders of bone density and structure, unspecified site: Secondary | ICD-10-CM | POA: Diagnosis not present

## 2015-03-07 DIAGNOSIS — F0280 Dementia in other diseases classified elsewhere without behavioral disturbance: Secondary | ICD-10-CM | POA: Diagnosis not present

## 2015-03-07 DIAGNOSIS — G309 Alzheimer's disease, unspecified: Secondary | ICD-10-CM | POA: Diagnosis not present

## 2015-03-07 DIAGNOSIS — E119 Type 2 diabetes mellitus without complications: Secondary | ICD-10-CM | POA: Diagnosis not present

## 2015-03-07 DIAGNOSIS — I10 Essential (primary) hypertension: Secondary | ICD-10-CM

## 2015-03-07 MED ORDER — RIVASTIGMINE 9.5 MG/24HR TD PT24: MEDICATED_PATCH | TRANSDERMAL | Status: DC

## 2015-03-08 ENCOUNTER — Encounter: Payer: Self-pay | Admitting: *Deleted

## 2015-03-13 ENCOUNTER — Encounter: Payer: Self-pay | Admitting: Internal Medicine

## 2015-03-13 DIAGNOSIS — E119 Type 2 diabetes mellitus without complications: Secondary | ICD-10-CM | POA: Insufficient documentation

## 2015-03-13 DIAGNOSIS — F0280 Dementia in other diseases classified elsewhere without behavioral disturbance: Secondary | ICD-10-CM | POA: Insufficient documentation

## 2015-03-13 DIAGNOSIS — G309 Alzheimer's disease, unspecified: Secondary | ICD-10-CM

## 2015-03-13 DIAGNOSIS — M8580 Other specified disorders of bone density and structure, unspecified site: Secondary | ICD-10-CM | POA: Insufficient documentation

## 2015-03-13 DIAGNOSIS — E785 Hyperlipidemia, unspecified: Secondary | ICD-10-CM | POA: Insufficient documentation

## 2015-03-13 DIAGNOSIS — K222 Esophageal obstruction: Secondary | ICD-10-CM | POA: Insufficient documentation

## 2015-03-13 DIAGNOSIS — I10 Essential (primary) hypertension: Secondary | ICD-10-CM | POA: Insufficient documentation

## 2015-03-13 NOTE — Progress Notes (Signed)
Patient ID: Grace Gilbert, female   DOB: 1932/05/10, 81 y.o.   MRN: [id number]    HISTORY AND PHYSICAL  Location:  [place] [facility]: [facility] (12)   Extended Emergency Contact Information Primary Emergency Contact: Contact,No  [place] Phone: [phone] Relation: None  Advanced Directive information Does patient have an advance directive?: Yes, Type of Advance Directive: Healthcare Power of [place];Living will, Does patient want to make changes to advanced directive?: No - Patient declined  Chief Complaint  Patient presents with  . Establish Care  . Medical Management of Chronic Issues    HPI:  Patient is here to establish herself as a new patient in our practice. Previously followed by Dr. Felipa Eth. She moved to [facility] 07/27/2014.  Patient's main problem is that of Alzheimer's disease. Apparently this is been progressive since about 2013. She was tried on Aricept but did not tolerate it. She is currently using an Exelon patch. She is aware of some memory losses.  There is an episode where she was found on the floor by her cleaning lady several months ago. Patient denies that she fell. She was seen in the emergency department 04/21/2013 for dizziness.  She has diabetes mellitus which is under good control with dietary efforts. She does not take medications.  Hypertension is well controlled.  Hyperlipidemia is present. She has been tried on multiple medications without success due to intolerances as listed below.  Past Medical History  Diagnosis Date  . Hypertension   . GERD (gastroesophageal reflux disease)   . Pneumonia 12-15 yrs.ago    yrs. ago  . Asthma   . Diabetes mellitus     borderline and under control-diet ,exercise  . Alzheimer's dementia   . HLD (hyperlipidemia)   . Vitamin D deficiency   . TIA (transient ischemic attack)   . Hiatal hernia   . Esophageal stricture   . Osteopenia   . Allergic  rhinitis   . Family history of colon cancer     Mother  . Abnormal CT scan, chest September 2011    Scar tissue    Past Surgical History  Procedure Laterality Date  . Tonsillectomy    . Abdominal hysterectomy  1980   . Shoulder open rotator cuff repair  05/22/2011    Procedure: ROTATOR CUFF REPAIR SHOULDER OPEN;  Surgeon: Tobi Bastos, MD;  Location: [facility];  Service: Orthopedics;  Laterality: Left;  . Cataract extraction Left 2014  . Thoracotomy / decortication parietal pleura Right 204    empyema    Patient Care Team: Estill Dooms, MD as PCP - General (Internal Medicine) Lindwood Coke, MD as Consulting Physician (Dermatology) Christophe Louis, MD as Consulting Physician (Obstetrics and Gynecology) Latanya Maudlin, MD as Consulting Physician (Orthopedic Surgery) Garlan Fair, MD as Consulting Physician (Gastroenterology) Barnett Abu, MD as Consulting Physician (Cardiology) Jiles Prows, MD as Consulting Physician (Allergy and Immunology) Rexene Alberts, MD as Consulting Physician (Cardiothoracic Surgery) Melida Quitter, MD as Consulting Physician (Otolaryngology)  Social History   Social History  . Marital Status: Single    Spouse Name: N/A  . Number of Children: N/A  . Years of Education: N/A   Occupational History  . Not on file.   Social History Main Topics  . Smoking status: Former Smoker -- 1.50 packs/day for 20 years    Types: Cigarettes    Quit date: 05/15/1987  . Smokeless tobacco: Not on file  .  Alcohol Use: 0.6 oz/week    1 Glasses of wine per week     Comment: occassionally  . Drug Use: No  . Sexual Activity: Not on file   Other Topics Concern  . Not on file   Social History Narrative   Previous Music therapist.   Retired.   Single    reports that she quit smoking about 27 years ago. Her smoking use included Cigarettes. She has a 30 pack-year smoking history. She does not have any smokeless tobacco history on file. She reports that she drinks  about 0.6 oz of alcohol per week. She reports that she does not use illicit drugs.  No family history on file. No family status information on file.    Immunization History  Administered Date(s) Administered  . Influenza-Unspecified 12/23/2013  . Pneumococcal Conjugate-13 11/22/2013  . Pneumococcal Polysaccharide-23 07/01/2006  . Tdap 12/17/2011  . Zoster 12/25/2005    Allergies  Allergen Reactions  . Aricept [Donepezil Hcl] Diarrhea  . Crestor [Rosuvastatin Calcium]     Myalgia  . Lovastatin     Myalgia  . Micardis [Telmisartan]     Fatigue  . Sertraline Diarrhea  . Welchol [Colesevelam Hcl]     Constipation  . Zetia [Ezetimibe]     Myalgia  . Zocor [Simvastatin]     Myalgia    Medications: Patient's Medications  New Prescriptions   RIVASTIGMINE (EXELON) 9.5 MG/24HR    Apply fresh patch daily and remove old patch to help preserve memory  Previous Medications   CALCIUM CARBONATE (OS-CAL) 600 MG TABLET    Take 600 mg by mouth 2 (two) times daily with a meal.   MIRTAZAPINE (REMERON) 7.5 MG TABLET    Take 7.5 mg by mouth at bedtime. Take a 1/2 tablet at bedtime.  Modified Medications   No medications on file  Discontinued Medications   AZELASTINE (ASTELIN) 137 MCG/SPRAY NASAL SPRAY    Place 1 spray into the nose every morning. Use in each nostril as directed   CALCIUM-VITAMIN D (OSCAL WITH D) 500-200 MG-UNIT PER TABLET    Take 1 tablet by mouth 2 (two) times daily.   CANDESARTAN (ATACAND) 16 MG TABLET    Take 8 mg by mouth daily after breakfast.   COENZYME Q10 200 MG CAPSULE    Take 200 mg by mouth daily.   DIAZEPAM (VALIUM) 5 MG TABLET    Take 0.5 tablets (2.5 mg total) by mouth every 8 (eight) hours as needed (dizziness).   LATANOPROST (XALATAN) 0.005 % OPHTHALMIC SOLUTION    Place 1 drop into both eyes at bedtime.   MOMETASONE (ASMANEX) 220 MCG/INH INHALER    Inhale 1 puff into the lungs daily.   MOMETASONE (NASONEX) 50 MCG/ACT NASAL SPRAY    Place 1 spray into the  nose daily.   MONTELUKAST (SINGULAIR) 10 MG TABLET    Take 10 mg by mouth at bedtime.   MULTIPLE VITAMIN (MULITIVITAMIN WITH MINERALS) TABS    Take 1 tablet by mouth daily.   RABEPRAZOLE (ACIPHEX) 20 MG TABLET    Take by mouth daily.    RANITIDINE (ZANTAC) 300 MG TABLET    Take 300 mg by mouth every evening.   RIVASTIGMINE (EXELON) 4.6 MG/24HR    Place 4.6 mg onto the skin daily.    Review of Systems  Constitutional: Negative for fever, chills, diaphoresis, activity change, appetite change, fatigue and unexpected weight change.  HENT: Negative for congestion, ear discharge, ear pain, hearing loss, postnasal drip, rhinorrhea, sore  throat, tinnitus, trouble swallowing and voice change.   Eyes: Negative for pain, redness, itching and visual disturbance.  Respiratory: Negative for cough, choking, shortness of breath and wheezing.        Pulmonary infection in 2004 which resulted in surgery with decortication of the right pleura due to an empyema.  Cardiovascular: Negative for chest pain, palpitations and leg swelling.  Gastrointestinal: Negative for nausea, abdominal pain, diarrhea, constipation and abdominal distention.  Endocrine: Negative for cold intolerance, heat intolerance, polydipsia, polyphagia and polyuria.       Diabetes mellitus. Diet controlled.  Genitourinary: Negative for dysuria, urgency, frequency, hematuria, flank pain, vaginal discharge, difficulty urinating and pelvic pain.  Musculoskeletal: Negative for myalgias, back pain, arthralgias, gait problem, neck pain and neck stiffness.  Skin: Negative for color change, pallor and rash.  Allergic/Immunologic: Negative.   Neurological: Negative for dizziness, tremors, seizures, syncope, weakness, numbness and headaches.       Progressive memory loss for 3 years. Alzheimer's disease is most likely diagnosis. Brain scans did show cerebral atrophy.  Hematological: Negative for adenopathy. Does not bruise/bleed easily.    Psychiatric/Behavioral: Positive for confusion. Negative for suicidal ideas, hallucinations, behavioral problems, sleep disturbance, dysphoric mood and agitation. The patient is not nervous/anxious and is not hyperactive.     Filed Vitals:   03/07/15 1017  BP: 142/78  Pulse: 84  Temp: 97.4 F (36.3 C)  TempSrc: Oral  Resp: 20  Height: 5' (1.524 m)  Weight: 148 lb 12.8 oz (67.495 kg)  SpO2: 94%   Body mass index is 29.06 kg/(m^2).  Physical Exam  Constitutional: She is oriented to person, place, and time. She appears well-developed and well-nourished. No distress.  HENT:  Right Ear: External ear normal.  Left Ear: External ear normal.  Nose: Nose normal.  Mouth/Throat: Oropharynx is clear and moist. No oropharyngeal exudate.  Eyes: Conjunctivae and EOM are normal. Pupils are equal, round, and reactive to light. No scleral icterus.  Neck: No JVD present. No tracheal deviation present. No thyromegaly present.  Cardiovascular: Normal rate, regular rhythm, normal heart sounds and intact distal pulses.  Exam reveals no gallop and no friction rub.   No murmur heard. Pulmonary/Chest: Effort normal. No respiratory distress. She has no wheezes. She has no rales. She exhibits no tenderness.  Abdominal: She exhibits no distension and no mass. There is no tenderness.  Musculoskeletal: Normal range of motion. She exhibits no edema or tenderness.  Lymphadenopathy:    She has no cervical adenopathy.  Neurological: She is alert and oriented to person, place, and time. No cranial nerve deficit. Coordination normal.  05/24/2014 MMSE 24/30. Failed clock drawing  Skin: No rash noted. She is not diaphoretic. No erythema. No pallor.  Scar right chest wall.  Psychiatric: She has a normal mood and affect. Her behavior is normal. Judgment and thought content normal.    Labs reviewed: Lab Summary Latest Ref Rng 04/21/2013 04/21/2013 05/15/2011  Hemoglobin 12.0 - 15.0 g/dL 12.9 12.4 12.5  Hematocrit  36.0 - 46.0 % 38.0 37.0 37.6  White count 4.0 - 10.5 K/uL (None) 11.1(H) 9.3  Platelet count 150 - 400 K/uL (None) 301 309  Sodium 137 - 147 mEq/L 139 (None) 140  Potassium 3.7 - 5.3 mEq/L 5.6(H) (None) 4.2  Calcium 8.4 - 10.5 mg/dL (None) (None) 9.7  Phosphorus - (None) (None) (None)  Creatinine 0.50 - 1.10 mg/dL 0.70 (None) 0.49(L)  AST 0 - 37 U/L (None) (None) 21  Alk Phos 39 - 117 U/L (None) (None)  54  Bilirubin 0.3 - 1.2 mg/dL (None) (None) 0.4  Glucose 70 - 99 mg/dL 136(H) (None) 119(H)  Cholesterol - (None) (None) (None)  HDL cholesterol - (None) (None) (None)  Triglycerides - (None) (None) (None)  LDL Direct - (None) (None) (None)  LDL Calc - (None) (None) (None)  Total protein 6.0 - 8.3 g/dL (None) (None) 7.4  Albumin 3.5 - 5.2 g/dL (None) (None) 3.8   Lab Results  Component Value Date   BUN 17 04/21/2013     Assessment/Plan  1. Alzheimer's dementia without behavioral disturbance, unspecified timing of dementia onset Slowly progressive since 2013 - rivastigmine (EXELON) 9.5 mg/24hr; Apply fresh patch daily and remove old patch to help preserve memory  Dispense: 30 patch; Refill: 12  2. Essential hypertension Controlled -CMP, future  3. Type 2 diabetes mellitus without complication, without long-term current use of insulin (HCC) Controlled -A1c and CMP, future  4. Alzheimer's dementia  5. HLD (hyperlipidemia) -Lipid panel, future  6. Esophageal stricture Asymptomatic on ranitidine  7. Osteopenia I'm unable to find a bone density exam.

## 2015-03-30 ENCOUNTER — Ambulatory Visit (INDEPENDENT_AMBULATORY_CARE_PROVIDER_SITE_OTHER): Payer: Medicare Other | Admitting: Emergency Medicine

## 2015-03-30 VITALS — BP 156/80 | HR 81 | Temp 97.4°F | Resp 14 | Ht 60.0 in | Wt 147.4 lb

## 2015-03-30 DIAGNOSIS — J0140 Acute pansinusitis, unspecified: Secondary | ICD-10-CM | POA: Diagnosis not present

## 2015-03-30 MED ORDER — DEXTROMETHORPHAN-GUAIFENESIN 10-200 MG PO CAPS: ORAL_CAPSULE | ORAL | Status: DC

## 2015-03-30 MED ORDER — AMOXICILLIN-POT CLAVULANATE 875-125 MG PO TABS: 1.0000 | ORAL_TABLET | Freq: Two times a day (BID) | ORAL | Status: DC

## 2015-03-30 NOTE — Progress Notes (Signed)
Subjective:  Patient ID: Grace Gilbert, female    DOB: 08-27-1932  Age: 81 y.o. MRN: [id number]  CC: Shortness of Breath; Nasal Congestion; Cough; and other   HPI Grace Gilbert presents  patient has a four-day history of nasal congestion and drainage. She has purulent nasal discharge and postnasal drip. She has no fever or chills. She's had no nausea or vomiting. She has no stool change. No rash. She has a cough that's not prominent and is largely nonproductive she is a poor historian. She claims shortness of breath with exertion and her blood pressure is usually controlled by medication but she feels that she is got an elevation in her blood pressure because "stress". Apparently they're moving her from 1 apartment to more closely supervised unit in the friend's home and that is her quite stressed.  History Grace Gilbert has a past medical history of Hypertension; GERD (gastroesophageal reflux disease); Pneumonia (12-15 yrs.ago); Asthma; Diabetes mellitus; Alzheimer's dementia; HLD (hyperlipidemia); Vitamin D deficiency; TIA (transient ischemic attack); Hiatal hernia; Esophageal stricture; Osteopenia; Allergic rhinitis; Family history of colon cancer; and Abnormal CT scan, chest (September 2011).   She has past surgical history that includes Tonsillectomy; Abdominal hysterectomy (1980 ); Shoulder open rotator cuff repair (05/22/2011); Cataract extraction (Left, 2014); and Thoracotomy / decortication parietal pleura (Right, 204).   Her  family history is not on file.  She   reports that she quit smoking about 27 years ago. Her smoking use included Cigarettes. She has a 30 pack-year smoking history. She does not have any smokeless tobacco history on file. She reports that she drinks about 0.6 oz of alcohol per week. She reports that she does not use illicit drugs.  Outpatient Prescriptions Prior to Visit  Medication Sig Dispense Refill  . calcium carbonate (OS-CAL) 600 MG tablet Take 600 mg by  mouth 2 (two) times daily with a meal.    . mirtazapine (REMERON) 7.5 MG tablet Take 7.5 mg by mouth at bedtime. Take a 1/2 tablet at bedtime.    . rivastigmine (EXELON) 9.5 mg/24hr Apply fresh patch daily and remove old patch to help preserve memory 30 patch 12   No facility-administered medications prior to visit.    Social History   Social History  . Marital Status: Single    Spouse Name: N/A  . Number of Children: N/A  . Years of Education: N/A   Social History Main Topics  . Smoking status: Former Smoker -- 1.50 packs/day for 20 years    Types: Cigarettes    Quit date: 05/15/1987  . Smokeless tobacco: None  . Alcohol Use: 0.6 oz/week    1 Glasses of wine per week     Comment: occassionally  . Drug Use: No  . Sexual Activity: Not Asked   Other Topics Concern  . None   Social History Narrative   Previous Music therapist.   Retired.   Single     Review of Systems  Constitutional: Negative for fever, chills and appetite change.  HENT: Positive for congestion, postnasal drip and rhinorrhea. Negative for ear pain, sinus pressure and sore throat.   Eyes: Negative for pain and redness.  Respiratory: Positive for cough. Negative for shortness of breath and wheezing.   Cardiovascular: Negative for leg swelling.  Gastrointestinal: Negative for nausea, vomiting, abdominal pain, diarrhea, constipation and blood in stool.  Endocrine: Negative for polyuria.  Genitourinary: Negative for dysuria, urgency, frequency and flank pain.  Musculoskeletal: Negative for gait problem.  Skin: Negative for rash.  Neurological: Negative for weakness and headaches.  Psychiatric/Behavioral: Negative for confusion and decreased concentration. The patient is not nervous/anxious.     Objective:  BP 156/80 mmHg  Pulse 81  Temp(Src) 97.4 F (36.3 C) (Oral)  Resp 14  Ht 5' (1.524 m)  Wt 147 lb 6.4 oz (66.86 kg)  BMI 28.79 kg/m2  SpO2 96%  Physical Exam  Constitutional: She is oriented to  person, place, and time. She appears well-developed and well-nourished. No distress.  HENT:  Head: Normocephalic and atraumatic.  Right Ear: External ear normal.  Left Ear: External ear normal.  Nose: Nose normal.  Eyes: Conjunctivae and EOM are normal. Pupils are equal, round, and reactive to light. No scleral icterus.  Neck: Normal range of motion. Neck supple. No tracheal deviation present.  Cardiovascular: Normal rate, regular rhythm and normal heart sounds.   Pulmonary/Chest: Effort normal. No respiratory distress. She has no wheezes. She has no rales.  Abdominal: She exhibits no mass. There is no tenderness. There is no rebound and no guarding.  Musculoskeletal: She exhibits no edema.  Lymphadenopathy:    She has no cervical adenopathy.  Neurological: She is alert and oriented to person, place, and time. Coordination normal.  Skin: Skin is warm and dry. No rash noted.  Psychiatric: She has a normal mood and affect. Her behavior is normal.      Assessment & Plan:   Shakinah was seen today for shortness of breath, nasal congestion, cough and other.  Diagnoses and all orders for this visit:  Acute pansinusitis, recurrence not specified  Other orders -     amoxicillin-clavulanate (AUGMENTIN) 875-125 MG tablet; Take 1 tablet by mouth 2 (two) times daily. -     Dextromethorphan-Guaifenesin 10-200 MG CAPS; Use as directed on package..    Dispense one package  I am having Ms. Sandell start on amoxicillin-clavulanate and Dextromethorphan-Guaifenesin. I am also having her maintain her mirtazapine, rivastigmine, and calcium carbonate.  Meds ordered this encounter  Medications  . amoxicillin-clavulanate (AUGMENTIN) 875-125 MG tablet    Sig: Take 1 tablet by mouth 2 (two) times daily.    Dispense:  20 tablet    Refill:  0  . Dextromethorphan-Guaifenesin 10-200 MG CAPS    Sig: Use as directed on package..    Dispense one package    Dispense:  1 each    Refill:  0    Appropriate  red flag conditions were discussed with the patient as well as actions that should be taken.  Patient expressed his understanding.  Follow-up: Return if symptoms worsen or fail to improve.  Roselee Culver, MD

## 2015-03-30 NOTE — Patient Instructions (Signed)

## 2015-04-20 DIAGNOSIS — I10 Essential (primary) hypertension: Secondary | ICD-10-CM | POA: Diagnosis not present

## 2015-04-20 LAB — LIPID PANEL
Cholesterol: 239 mg/dL — AB (ref 0–200)
HDL: 52 mg/dL (ref 35–70)
LDL CALC: 160 mg/dL
TRIGLYCERIDES: 135 mg/dL (ref 40–160)

## 2015-04-20 LAB — HEPATIC FUNCTION PANEL
ALT: 16 U/L (ref 7–35)
AST: 22 U/L (ref 13–35)
Alkaline Phosphatase: 72 U/L (ref 25–125)
BILIRUBIN, TOTAL: 1.1 mg/dL

## 2015-04-20 LAB — BASIC METABOLIC PANEL
BUN: 8 mg/dL (ref 4–21)
Creatinine: 0.7 mg/dL (ref 0.5–1.1)
Glucose: 107 mg/dL
POTASSIUM: 4.3 mmol/L (ref 3.4–5.3)
SODIUM: 137 mmol/L (ref 137–147)

## 2015-04-20 LAB — TSH: TSH: 2.6 u[IU]/mL (ref 0.41–5.90)

## 2015-04-25 ENCOUNTER — Encounter: Payer: Self-pay | Admitting: Internal Medicine

## 2015-04-25 ENCOUNTER — Non-Acute Institutional Stay: Payer: Medicare Other | Admitting: Internal Medicine

## 2015-04-25 VITALS — BP 132/80 | HR 77 | Temp 97.7°F | Resp 20 | Ht 60.0 in | Wt 140.4 lb

## 2015-04-25 DIAGNOSIS — I10 Essential (primary) hypertension: Secondary | ICD-10-CM | POA: Diagnosis not present

## 2015-04-25 DIAGNOSIS — E785 Hyperlipidemia, unspecified: Secondary | ICD-10-CM | POA: Diagnosis not present

## 2015-04-25 DIAGNOSIS — E119 Type 2 diabetes mellitus without complications: Secondary | ICD-10-CM | POA: Diagnosis not present

## 2015-04-25 DIAGNOSIS — G309 Alzheimer's disease, unspecified: Secondary | ICD-10-CM | POA: Diagnosis not present

## 2015-04-25 DIAGNOSIS — F0280 Dementia in other diseases classified elsewhere without behavioral disturbance: Secondary | ICD-10-CM | POA: Diagnosis not present
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
## 2015-06-22 DIAGNOSIS — H2513 Age-related nuclear cataract, bilateral: Secondary | ICD-10-CM | POA: Diagnosis not present

## 2015-08-22 ENCOUNTER — Encounter: Payer: Self-pay | Admitting: Internal Medicine

## 2015-08-22 ENCOUNTER — Non-Acute Institutional Stay: Payer: Medicare Other | Admitting: Internal Medicine

## 2015-08-22 VITALS — BP 144/64 | HR 71 | Temp 97.7°F | Ht 60.0 in | Wt 143.0 lb

## 2015-08-22 DIAGNOSIS — I10 Essential (primary) hypertension: Secondary | ICD-10-CM

## 2015-08-22 DIAGNOSIS — F329 Major depressive disorder, single episode, unspecified: Secondary | ICD-10-CM | POA: Diagnosis not present

## 2015-08-22 DIAGNOSIS — F32A Depression, unspecified: Secondary | ICD-10-CM | POA: Insufficient documentation

## 2015-08-22 DIAGNOSIS — E119 Type 2 diabetes mellitus without complications: Secondary | ICD-10-CM | POA: Diagnosis not present

## 2015-08-22 DIAGNOSIS — F0280 Dementia in other diseases classified elsewhere without behavioral disturbance: Secondary | ICD-10-CM

## 2015-08-22 DIAGNOSIS — K222 Esophageal obstruction: Secondary | ICD-10-CM | POA: Diagnosis not present

## 2015-08-22 DIAGNOSIS — E785 Hyperlipidemia, unspecified: Secondary | ICD-10-CM

## 2015-08-22 DIAGNOSIS — G309 Alzheimer's disease, unspecified: Secondary | ICD-10-CM | POA: Diagnosis not present

## 2015-08-22 NOTE — Progress Notes (Signed)
Patient ID: Grace Gilbert, female   DOB: 04/01/1932, 82 y.o.   MRN: [id number]    [facility]     [facility]     Allergies  Allergen Reactions  . Aricept [Donepezil Hcl] Diarrhea  . Crestor [Rosuvastatin Calcium]     Myalgia  . Lovastatin     Myalgia  . Micardis [Telmisartan]     Fatigue  . Sertraline Diarrhea  . Welchol [Colesevelam Hcl]     Constipation  . Zetia [Ezetimibe]     Myalgia  . Zocor [Simvastatin]     Myalgia    Chief Complaint  Patient presents with  . Medical Management of Chronic Issues    4 month medication managemnet Alzheimer, blood sugar, blood pressure.  . Medication Management    Wants to know if she should take Vitamin D?   . Weight Gain    Her cousin is a doctor wants to know if she should stop Remeron because of gaining weight. Patient says its because of her eating.     HPI:  Alzheimer's dementia - Currently on Exelon patch 9.5 mg daily  Type 2 diabetes mellitus without complication, without long-term current use of insulin (HCC) - no recent lab  Esophageal stricture - denies pain in the throat. No nausea.  Essential hypertension - adequately controlled  HLD (hyperlipidemia) - no recent lab  Depression - doing well. Cousin does not feel she needs to be on mirtazapine anymore.  Patient has vitamin D in her medication, but she has not been taking it.    Medications: Patient's Medications  New Prescriptions   No medications on file  Previous Medications   MIRTAZAPINE (REMERON) 7.5 MG TABLET    Take 7.5 mg by mouth at bedtime. Take a 1/2 tablet at bedtime.   RIVASTIGMINE (EXELON) 9.5 MG/24HR    Apply fresh patch daily and remove old patch to help preserve memory  Modified Medications   No medications on file  Discontinued Medications   No medications on file     Review of Systems  Constitutional: Negative for fever, chills, diaphoresis, activity change, appetite change, fatigue and unexpected  weight change.  HENT: Negative for congestion, ear discharge, ear pain, hearing loss, postnasal drip, rhinorrhea, sore throat, tinnitus, trouble swallowing and voice change.   Eyes: Negative for pain, redness, itching and visual disturbance.  Respiratory: Negative for cough, choking, shortness of breath and wheezing.        Pulmonary infection in 2004 which resulted in surgery with decortication of the right pleura due to an empyema.  Cardiovascular: Negative for chest pain, palpitations and leg swelling.  Gastrointestinal: Negative for nausea, abdominal pain, diarrhea, constipation and abdominal distention.  Endocrine: Negative for cold intolerance, heat intolerance, polydipsia, polyphagia and polyuria.       Diabetes mellitus. Diet controlled.  Genitourinary: Negative for dysuria, urgency, frequency, hematuria, flank pain, vaginal discharge, difficulty urinating and pelvic pain.  Musculoskeletal: Negative for myalgias, back pain, arthralgias, gait problem, neck pain and neck stiffness.  Skin: Negative for color change, pallor and rash.  Allergic/Immunologic: Negative.   Neurological: Negative for dizziness, tremors, seizures, syncope, weakness, numbness and headaches.       Progressive memory loss for 3 years. Alzheimer's disease is most likely diagnosis. Brain scans did show cerebral atrophy.  Hematological: Negative for adenopathy. Does not bruise/bleed easily.  Psychiatric/Behavioral: Positive for confusion. Negative for suicidal ideas, hallucinations, behavioral problems, sleep disturbance, dysphoric mood and agitation. The patient is not nervous/anxious and   is not hyperactive.     Filed Vitals:   08/22/15 0917  BP: 144/64  Pulse: 71  Temp: 97.7 F (36.5 C)  TempSrc: Oral  Height: 5' (1.524 m)  Weight: 143 lb (64.864 kg)  SpO2: 97%   Wt Readings from Last 3 Encounters:  08/22/15 143 lb (64.864 kg)  04/25/15 140 lb 6.4 oz (63.685 kg)  03/30/15 147 lb 6.4 oz (66.86 kg)    Body  mass index is 27.93 kg/(m^2).  Physical Exam  Constitutional: She is oriented to person, place, and time. She appears well-developed and well-nourished. No distress.  HENT:  Right Ear: External ear normal.  Left Ear: External ear normal.  Nose: Nose normal.  Mouth/Throat: Oropharynx is clear and moist. No oropharyngeal exudate.  Eyes: Conjunctivae and EOM are normal. Pupils are equal, round, and reactive to light. No scleral icterus.  Neck: No JVD present. No tracheal deviation present. No thyromegaly present.  Cardiovascular: Normal rate, regular rhythm, normal heart sounds and intact distal pulses.  Exam reveals no gallop and no friction rub.   No murmur heard. Pulmonary/Chest: Effort normal. No respiratory distress. She has no wheezes. She has no rales. She exhibits no tenderness.  Abdominal: She exhibits no distension and no mass. There is no tenderness.  Musculoskeletal: Normal range of motion. She exhibits no edema or tenderness.  Lymphadenopathy:    She has no cervical adenopathy.  Neurological: She is alert and oriented to person, place, and time. No cranial nerve deficit. Coordination normal.  05/24/2014 MMSE 24/30. Failed clock drawing  Skin: No rash noted. She is not diaphoretic. No erythema. No pallor.  Scar right chest wall.  Psychiatric: She has a normal mood and affect. Her behavior is normal. Judgment and thought content normal.     Labs reviewed: Lab Summary Latest Ref Rng 04/20/2015 04/21/2013 04/21/2013 05/15/2011  Hemoglobin 12.0 - 15.0 g/dL (None) 12.9 12.4 12.5  Hematocrit 36.0 - 46.0 % (None) 38.0 37.0 37.6  White count 4.0 - 10.5 K/uL (None) (None) 11.1(H) 9.3  Platelet count 150 - 400 K/uL (None) (None) 301 309  Sodium 137 - 147 mmol/L 137 139 (None) 140  Potassium 3.4 - 5.3 mmol/L 4.3 5.6(H) (None) 4.2  Calcium 8.4 - 10.5 mg/dL (None) (None) (None) 9.7  Phosphorus - (None) (None) (None) (None)  Creatinine 0.5 - 1.1 mg/dL 0.7 0.70 (None) 0.49(L)  AST 13 - 35  U/L 22 (None) (None) 21  Alk Phos 25 - 125 U/L 72 (None) (None) 54  Bilirubin 0.3 - 1.2 mg/dL (None) (None) (None) 0.4  Glucose - 107 136(H) (None) 119(H)  Cholesterol 0 - 200 mg/dL 239(A) (None) (None) (None)  HDL cholesterol 35 - 70 mg/dL 52 (None) (None) (None)  Triglycerides 40 - 160 mg/dL 135 (None) (None) (None)  LDL Direct - (None) (None) (None) (None)  LDL Calc - 160 (None) (None) (None)  Total protein 6.0 - 8.3 g/dL (None) (None) (None) 7.4  Albumin 3.5 - 5.2 g/dL (None) (None) (None) 3.8   Lab Results  Component Value Date   TSH 2.60 04/20/2015   Lab Results  Component Value Date   BUN 8 04/20/2015   BUN 17 04/21/2013   BUN 9 05/15/2011   Lab Results  Component Value Date   CREATININE 0.7 04/20/2015   CREATININE 0.70 04/21/2013   CREATININE 0.49* 05/15/2011   No results found for: HGBA1C     Assessment/Plan 1. Alzheimer's dementia Continue Exelon patch 9.5 mg per 24 hours  2. Type 2 diabetes mellitus  without complication, without long-term current use of insulin (HCC) -A1c, CMP, urine microalbumin, future  3. Esophageal stricture Doing well off medications to reduce acid  4. Essential hypertension Adequately controlled. Not currently on any medication  5. HLD (hyperlipidemia) -Lipid panel, future  6. Depression Discontinue mirtazapine   Continue vitamin D 2000 units daily

## 2016-01-11 DIAGNOSIS — Z23 Encounter for immunization: Secondary | ICD-10-CM | POA: Diagnosis not present

## 2016-01-23 ENCOUNTER — Other Ambulatory Visit: Payer: Self-pay

## 2016-01-23 DIAGNOSIS — E119 Type 2 diabetes mellitus without complications: Secondary | ICD-10-CM

## 2016-01-23 DIAGNOSIS — I10 Essential (primary) hypertension: Secondary | ICD-10-CM

## 2016-01-23 DIAGNOSIS — E785 Hyperlipidemia, unspecified: Secondary | ICD-10-CM

## 2016-01-24 ENCOUNTER — Other Ambulatory Visit: Payer: Self-pay

## 2016-01-24 DIAGNOSIS — I10 Essential (primary) hypertension: Secondary | ICD-10-CM

## 2016-01-24 DIAGNOSIS — E7849 Other hyperlipidemia: Secondary | ICD-10-CM

## 2016-01-24 DIAGNOSIS — E785 Hyperlipidemia, unspecified: Secondary | ICD-10-CM

## 2016-01-24 DIAGNOSIS — E119 Type 2 diabetes mellitus without complications: Secondary | ICD-10-CM

## 2016-02-12 ENCOUNTER — Other Ambulatory Visit: Payer: Self-pay | Admitting: Internal Medicine

## 2016-02-12 DIAGNOSIS — I10 Essential (primary) hypertension: Secondary | ICD-10-CM | POA: Diagnosis not present

## 2016-02-12 DIAGNOSIS — E785 Hyperlipidemia, unspecified: Secondary | ICD-10-CM | POA: Diagnosis not present

## 2016-02-12 DIAGNOSIS — E119 Type 2 diabetes mellitus without complications: Secondary | ICD-10-CM | POA: Diagnosis not present

## 2016-02-12 DIAGNOSIS — E784 Other hyperlipidemia: Secondary | ICD-10-CM | POA: Diagnosis not present

## 2016-02-12 LAB — COMPLETE METABOLIC PANEL WITH GFR
ALT: 13 U/L (ref 6–29)
AST: 21 U/L (ref 10–35)
Albumin: 4.6 g/dL (ref 3.6–5.1)
Alkaline Phosphatase: 71 U/L (ref 33–130)
BUN: 9 mg/dL (ref 7–25)
CALCIUM: 9.5 mg/dL (ref 8.6–10.4)
CHLORIDE: 103 mmol/L (ref 98–110)
CO2: 29 mmol/L (ref 20–31)
Creat: 0.71 mg/dL (ref 0.60–0.88)
GFR, Est Non African American: 80 mL/min (ref >=60)
Glucose, Bld: 98 mg/dL (ref 65–99)
POTASSIUM: 4.4 mmol/L (ref 3.5–5.3)
Sodium: 140 mmol/L (ref 135–146)
Total Bilirubin: 1 mg/dL (ref 0.2–1.2)
Total Protein: 7.3 g/dL (ref 6.1–8.1)

## 2016-02-12 LAB — MICROALBUMIN / CREATININE URINE RATIO
CREATININE, URINE: 42 mg/dL (ref 20–320)
MICROALB UR: 0.2 mg/dL
Microalb Creat Ratio: 5 mcg/mg creat (ref <30)

## 2016-02-12 LAB — LIPID PANEL
CHOLESTEROL: 269 mg/dL — AB (ref <200)
HDL: 49 mg/dL — ABNORMAL LOW (ref >50)
LDL Cholesterol: 167 mg/dL — ABNORMAL HIGH (ref <100)
TRIGLYCERIDES: 263 mg/dL — AB (ref <150)
Total CHOL/HDL Ratio: 5.5 Ratio — ABNORMAL HIGH (ref <5.0)
VLDL: 53 mg/dL — ABNORMAL HIGH (ref <30)

## 2016-02-12 LAB — HEMOGLOBIN A1C
Hgb A1c MFr Bld: 6 % — ABNORMAL HIGH (ref <5.7)
Mean Plasma Glucose: 126 mg/dL

## 2016-02-20 ENCOUNTER — Non-Acute Institutional Stay: Payer: Medicare Other | Admitting: Internal Medicine

## 2016-02-20 ENCOUNTER — Encounter: Payer: Self-pay | Admitting: Internal Medicine

## 2016-02-20 VITALS — BP 154/86 | HR 71 | Temp 97.5°F | Ht 60.0 in | Wt 141.0 lb

## 2016-02-20 DIAGNOSIS — F0280 Dementia in other diseases classified elsewhere without behavioral disturbance: Secondary | ICD-10-CM

## 2016-02-20 DIAGNOSIS — E784 Other hyperlipidemia: Secondary | ICD-10-CM | POA: Diagnosis not present

## 2016-02-20 DIAGNOSIS — E119 Type 2 diabetes mellitus without complications: Secondary | ICD-10-CM

## 2016-02-20 DIAGNOSIS — G301 Alzheimer's disease with late onset: Secondary | ICD-10-CM | POA: Diagnosis not present

## 2016-02-20 DIAGNOSIS — F32A Depression, unspecified: Secondary | ICD-10-CM

## 2016-02-20 DIAGNOSIS — F329 Major depressive disorder, single episode, unspecified: Secondary | ICD-10-CM | POA: Diagnosis not present

## 2016-02-20 DIAGNOSIS — E7849 Other hyperlipidemia: Secondary | ICD-10-CM

## 2016-02-20 DIAGNOSIS — I10 Essential (primary) hypertension: Secondary | ICD-10-CM

## 2016-02-20 NOTE — Progress Notes (Signed)
Facility  [facility]    Place of Service: [facility] (12)     Allergies  Allergen Reactions  . Aricept [Donepezil Hcl] Diarrhea  . Crestor [Rosuvastatin Calcium]     Myalgia  . Lovastatin     Myalgia  . Micardis [Telmisartan]     Fatigue  . Sertraline Diarrhea  . Welchol [Colesevelam Hcl]     Constipation  . Zetia [Ezetimibe]     Myalgia  . Zocor [Simvastatin]     Myalgia    Chief Complaint  Patient presents with  . Medical Management of Chronic Issues    6 month medication management blood sugar, blood pressure, Alzheimers, depression, review labs.     HPI:  Late onset Alzheimer's disease without behavioral disturbance - unchanged  Type 2 diabetes mellitus without complication, without long-term current use of insulin ([place]) - controlled  Other hyperlipidemia - uncontrolled. Intolerant to multiple agents to lower cholesterol  Essential hypertension - controlled  Depression, unspecified depression type - improved    Medications: Patient's Medications  New Prescriptions   No medications on file  Previous Medications   RIVASTIGMINE (EXELON) 9.5 MG/24HR    Apply fresh patch daily and remove old patch to help preserve memory  Modified Medications   No medications on file  Discontinued Medications   No medications on file     Review of Systems  Constitutional: Negative for activity change, appetite change, chills, diaphoresis, fatigue, fever and unexpected weight change.  HENT: Negative for congestion, ear discharge, ear pain, hearing loss, postnasal drip, rhinorrhea, sore throat, tinnitus, trouble swallowing and voice change.   Eyes: Negative for pain, redness, itching and visual disturbance.  Respiratory: Negative for cough, choking, shortness of breath and wheezing.        Pulmonary infection in 2004 which resulted in surgery with decortication of the right pleura due to an empyema.  Cardiovascular: Negative for chest pain, palpitations and leg swelling.    Gastrointestinal: Negative for abdominal distention, abdominal pain, constipation, diarrhea and nausea.  Endocrine: Negative for cold intolerance, heat intolerance, polydipsia, polyphagia and polyuria.       Diabetes mellitus. Diet controlled.  Genitourinary: Negative for difficulty urinating, dysuria, flank pain, frequency, hematuria, pelvic pain, urgency and vaginal discharge.  Musculoskeletal: Negative for arthralgias, back pain, gait problem, myalgias, neck pain and neck stiffness.  Skin: Negative for color change, pallor and rash.  Allergic/Immunologic: Negative.   Neurological: Negative for dizziness, tremors, seizures, syncope, weakness, numbness and headaches.       Progressive memory loss for 3 years. Alzheimer's disease is most likely diagnosis. Brain scans did show cerebral atrophy.  Hematological: Negative for adenopathy. Does not bruise/bleed easily.  Psychiatric/Behavioral: Positive for confusion. Negative for agitation, behavioral problems, dysphoric mood, hallucinations, sleep disturbance and suicidal ideas. The patient is not nervous/anxious and is not hyperactive.     Vitals:   02/20/16 0927  BP: (!) 154/86  Pulse: 71  Temp: 97.5 F (36.4 C)  TempSrc: Oral  SpO2: 99%  Weight: 141 lb (64 kg)  Height: 5' (1.524 m)   Wt Readings from Last 3 Encounters:  02/20/16 141 lb (64 kg)  08/22/15 143 lb (64.9 kg)  04/25/15 140 lb 6.4 oz (63.7 kg)    Body mass index is 27.54 kg/m.  Physical Exam  Constitutional: She is oriented to person, place, and time. She appears well-developed and well-nourished. No distress.  HENT:  Right Ear: External ear normal.  Left Ear: External ear normal.  Nose: Nose normal.  Mouth/Throat:  Oropharynx is clear and moist. No oropharyngeal exudate.  Eyes: Conjunctivae and EOM are normal. Pupils are equal, round, and reactive to light. No scleral icterus.  Neck: No JVD present. No tracheal deviation present. No thyromegaly present.   Cardiovascular: Normal rate, regular rhythm, normal heart sounds and intact distal pulses.  Exam reveals no gallop and no friction rub.   No murmur heard. Pulmonary/Chest: Effort normal. No respiratory distress. She has no wheezes. She has no rales. She exhibits no tenderness.  Abdominal: She exhibits no distension and no mass. There is no tenderness.  Musculoskeletal: Normal range of motion. She exhibits no edema or tenderness.  Lymphadenopathy:    She has no cervical adenopathy.  Neurological: She is alert and oriented to person, place, and time. No cranial nerve deficit. Coordination normal.  05/24/2014 MMSE 24/30. Failed clock drawing  Skin: No rash noted. She is not diaphoretic. No erythema. No pallor.  Scar right chest wall.  Psychiatric: She has a normal mood and affect. Her behavior is normal. Judgment and thought content normal.     Labs reviewed: Lab Summary Latest Ref Rng & Units 02/12/2016 04/20/2015 04/21/2013 04/21/2013  Hemoglobin 12.0 - 15.0 g/dL (None) (None) 12.9 12.4  Hematocrit 36.0 - 46.0 % (None) (None) 38.0 37.0  White count 4.0 - 10.5 K/uL (None) (None) (None) 11.1(H)  Platelet count 150 - 400 K/uL (None) (None) (None) 301  Sodium 135 - 146 mmol/L 140 137 139 (None)  Potassium 3.5 - 5.3 mmol/L 4.4 4.3 5.6(H) (None)  Calcium 8.6 - 10.4 mg/dL 9.5 (None) (None) (None)  Phosphorus - (None) (None) (None) (None)  Creatinine 0.60 - 0.88 mg/dL 0.71 0.7 0.70 (None)  AST 10 - 35 U/L 21 22 (None) (None)  Alk Phos 33 - 130 U/L 71 72 (None) (None)  Bilirubin 0.2 - 1.2 mg/dL 1.0 (None) (None) (None)  Glucose 65 - 99 mg/dL 98 107 136(H) (None)  Cholesterol <200 mg/dL 269(H) 239(A) (None) (None)  HDL cholesterol >50 mg/dL 49(L) 52 (None) (None)  Triglycerides <150 mg/dL 263(H) 135 (None) (None)  LDL Direct - (None) (None) (None) (None)  LDL Calc <100 mg/dL 167(H) 160 (None) (None)  Total protein 6.1 - 8.1 g/dL 7.3 (None) (None) (None)  Albumin 3.6 - 5.1 g/dL 4.6 (None)  (None) (None)  Some recent data might be hidden   Lab Results  Component Value Date   TSH 2.60 04/20/2015   Lab Results  Component Value Date   BUN 9 02/12/2016   BUN 8 04/20/2015   BUN 17 04/21/2013   Lab Results  Component Value Date   CREATININE 0.71 02/12/2016   CREATININE 0.7 04/20/2015   CREATININE 0.70 04/21/2013   Lab Results  Component Value Date   HGBA1C 6.0 (H) 02/12/2016       Assessment/Plan  1. Late onset Alzheimer's disease without behavioral disturbance Unchanged -continue rivastigmine -MMSE , future  2. Type 2 diabetes mellitus without complication, without long-term current use of insulin ([facility]) -controlled  3. Other hyperlipidemia controlled  4. Essential hypertension -mild elevation of the SBP. No changed in medication.  5. Depression, unspecified depression type improved

## 2016-06-17 ENCOUNTER — Other Ambulatory Visit: Payer: Medicare Other

## 2016-06-17 LAB — COMPREHENSIVE METABOLIC PANEL
ALK PHOS: 59 U/L (ref 33–130)
ALT: 18 U/L (ref 6–29)
AST: 23 U/L (ref 10–35)
Albumin: 4.2 g/dL (ref 3.6–5.1)
BILIRUBIN TOTAL: 0.9 mg/dL (ref 0.2–1.2)
BUN: 8 mg/dL (ref 7–25)
CALCIUM: 9.3 mg/dL (ref 8.6–10.4)
CO2: 26 mmol/L (ref 20–31)
Chloride: 104 mmol/L (ref 98–110)
Creat: 0.62 mg/dL (ref 0.60–0.88)
GLUCOSE: 106 mg/dL — AB (ref 65–99)
POTASSIUM: 4.2 mmol/L (ref 3.5–5.3)
Sodium: 140 mmol/L (ref 135–146)
TOTAL PROTEIN: 6.9 g/dL (ref 6.1–8.1)

## 2016-06-17 LAB — LIPID PANEL
CHOLESTEROL: 227 mg/dL — AB (ref <200)
HDL: 44 mg/dL — ABNORMAL LOW (ref >50)
LDL Cholesterol: 142 mg/dL — ABNORMAL HIGH (ref <100)
TRIGLYCERIDES: 207 mg/dL — AB (ref <150)
Total CHOL/HDL Ratio: 5.2 Ratio — ABNORMAL HIGH (ref <5.0)
VLDL: 41 mg/dL — ABNORMAL HIGH (ref <30)

## 2016-06-18 LAB — HEMOGLOBIN A1C
HEMOGLOBIN A1C: 5.9 % — AB (ref <5.7)
Mean Plasma Glucose: 123 mg/dL

## 2016-06-25 ENCOUNTER — Encounter: Payer: Self-pay | Admitting: Internal Medicine

## 2016-07-02 ENCOUNTER — Non-Acute Institutional Stay: Payer: Medicare Other | Admitting: Internal Medicine

## 2016-07-02 ENCOUNTER — Encounter: Payer: Self-pay | Admitting: Internal Medicine

## 2016-07-02 VITALS — BP 138/64 | HR 74 | Temp 97.9°F | Ht 60.0 in | Wt 143.0 lb

## 2016-07-02 DIAGNOSIS — E7849 Other hyperlipidemia: Secondary | ICD-10-CM

## 2016-07-02 DIAGNOSIS — E119 Type 2 diabetes mellitus without complications: Secondary | ICD-10-CM

## 2016-07-02 DIAGNOSIS — G301 Alzheimer's disease with late onset: Secondary | ICD-10-CM | POA: Diagnosis not present

## 2016-07-02 DIAGNOSIS — I10 Essential (primary) hypertension: Secondary | ICD-10-CM | POA: Diagnosis not present

## 2016-07-02 DIAGNOSIS — F0280 Dementia in other diseases classified elsewhere without behavioral disturbance: Secondary | ICD-10-CM

## 2016-07-02 DIAGNOSIS — F32A Depression, unspecified: Secondary | ICD-10-CM

## 2016-07-02 DIAGNOSIS — E784 Other hyperlipidemia: Secondary | ICD-10-CM | POA: Diagnosis not present

## 2016-07-02 DIAGNOSIS — F329 Major depressive disorder, single episode, unspecified: Secondary | ICD-10-CM

## 2016-07-02 MED ORDER — MEMANTINE HCL ER 7 & 14 & 21 &28 MG PO CP24: ORAL_CAPSULE | ORAL | 0 refills | Status: DC

## 2016-07-02 NOTE — Progress Notes (Addendum)
Facility  [facility]    Place of Service: Clinic ([phone])   [facility] Contact: Larrie Kass, MD, cousin--  Allergies  Allergen Reactions  . Aricept [Donepezil Hcl] Diarrhea  . Crestor [Rosuvastatin Calcium]     Myalgia  . Lovastatin     Myalgia  . Micardis [Telmisartan]     Fatigue  . Sertraline Diarrhea  . Welchol [Colesevelam Hcl]     Constipation  . Zetia [Ezetimibe]     Myalgia  . Zocor [Simvastatin]     Myalgia    Chief Complaint  Patient presents with  . Medical Management of Chronic Issues    4 month medication management Alzheimer's, blood sugar, blood pressure, cholesterol, depression, review labs.   Marland Kitchen MMSE    13/30 failed clock drawing    HPI:  Late onset Alzheimer's disease without behavioral disturbance - continues to deteriorate  Type 2 diabetes mellitus without complication, without long-term current use of insulin ([facility]) - well controlled  Essential hypertension - controlled  Other hyperlipidemia - runs a little high, but at her age, I have chosen not to treat.  Depression, unspecified depression type - in remission    Medications: Patient's Medications  New Prescriptions   No medications on file  Previous Medications   CETIRIZINE (ZYRTEC) 10 MG TABLET    Take 10 mg by mouth. Take one daily for allergies   RIVASTIGMINE (EXELON) 9.5 MG/24HR    Apply fresh patch daily and remove old patch to help preserve memory  Modified Medications   No medications on file  Discontinued Medications   No medications on file    Review of Systems  Constitutional: Negative for activity change, appetite change, chills, diaphoresis, fatigue, fever and unexpected weight change.  HENT: Negative for congestion, ear discharge, ear pain, hearing loss, postnasal drip, rhinorrhea, sore throat, tinnitus, trouble swallowing and voice change.   Eyes: Negative for pain, redness, itching and visual disturbance.  Respiratory: Negative for cough, choking, shortness of  breath and wheezing.        Pulmonary infection in 2004 which resulted in surgery with decortication of the right pleura due to an empyema.  Cardiovascular: Negative for chest pain, palpitations and leg swelling.  Gastrointestinal: Negative for abdominal distention, abdominal pain, constipation, diarrhea and nausea.  Endocrine: Negative for cold intolerance, heat intolerance, polydipsia, polyphagia and polyuria.       Diabetes mellitus. Diet controlled.  Genitourinary: Negative for difficulty urinating, dysuria, flank pain, frequency, hematuria, pelvic pain, urgency and vaginal discharge.  Musculoskeletal: Negative for arthralgias, back pain, gait problem, myalgias, neck pain and neck stiffness.  Skin: Negative for color change, pallor and rash.  Allergic/Immunologic: Negative.   Neurological: Negative for dizziness, tremors, seizures, syncope, weakness, numbness and headaches.       Progressive memory loss since 2014. Alzheimer's disease is most likely diagnosis. Brain scans did show cerebral atrophy. Unable to name objects.   Hematological: Negative for adenopathy. Does not bruise/bleed easily.  Psychiatric/Behavioral: Positive for confusion and decreased concentration. Negative for agitation, behavioral problems, dysphoric mood, hallucinations, sleep disturbance and suicidal ideas. The patient is not nervous/anxious and is not hyperactive.     Vitals:   07/02/16 0959  BP: 138/64  Pulse: 74  Temp: 97.9 F (36.6 C)  TempSrc: Oral  SpO2: 97%  Weight: 143 lb (64.9 kg)  Height: 5' (1.524 m)   Body mass index is 27.93 kg/m. Wt Readings from Last 3 Encounters:  07/02/16 143 lb (64.9 kg)  02/20/16 141 lb (64 kg)  08/22/15 143 lb (64.9 kg)      Physical Exam  Constitutional: She is oriented to person, place, and time. She appears well-developed and well-nourished. No distress.  HENT:  Right Ear: External ear normal.  Left Ear: External ear normal.  Nose: Nose normal.    Mouth/Throat: Oropharynx is clear and moist. No oropharyngeal exudate.  Eyes: Conjunctivae and EOM are normal. Pupils are equal, round, and reactive to light. No scleral icterus.  Neck: No JVD present. No tracheal deviation present. No thyromegaly present.  Cardiovascular: Normal rate, regular rhythm, normal heart sounds and intact distal pulses.  Exam reveals no gallop and no friction rub.   No murmur heard. Pulmonary/Chest: Effort normal. No respiratory distress. She has no wheezes. She has no rales. She exhibits no tenderness.  Abdominal: She exhibits no distension and no mass. There is no tenderness.  Musculoskeletal: Normal range of motion. She exhibits no edema or tenderness.  Lymphadenopathy:    She has no cervical adenopathy.  Neurological: She is alert and oriented to person, place, and time. No cranial nerve deficit. Coordination normal.  05/24/2014 MMSE 24/30. Failed clock drawing 07/02/16 MMSE 13/30. Failed clock drawing.  Skin: No rash noted. She is not diaphoretic. No erythema. No pallor.  Scar right chest wall.  Psychiatric: She has a normal mood and affect. Her behavior is normal. Judgment and thought content normal.    Labs reviewed: Lab Summary Latest Ref Rng & Units 06/17/2016 02/12/2016 04/20/2015  Hemoglobin 13.0-17.0 g/dL (None) (None) (None)  Hematocrit 39.0-52.0 % (None) (None) (None)  White count - (None) (None) (None)  Platelet count - (None) (None) (None)  Sodium 135 - 146 mmol/L 140 140 137  Potassium 3.5 - 5.3 mmol/L 4.2 4.4 4.3  Calcium 8.6 - 10.4 mg/dL 9.3 9.5 (None)  Phosphorus - (None) (None) (None)  Creatinine 0.60 - 0.88 mg/dL 0.62 0.71 0.7  AST 10 - 35 U/L '23 21 22  ' Alk Phos 33 - 130 U/L 59 71 72  Bilirubin 0.2 - 1.2 mg/dL 0.9 1.0 (None)  Glucose 65 - 99 mg/dL 106(H) 98 107  Cholesterol <200 mg/dL 227(H) 269(H) 239(A)  HDL cholesterol >50 mg/dL 44(L) 49(L) 52  Triglycerides <150 mg/dL 207(H) 263(H) 135  LDL Direct - (None) (None) (None)  LDL Calc  <100 mg/dL 142(H) 167(H) 160  Total protein 6.1 - 8.1 g/dL 6.9 7.3 (None)  Albumin 3.6 - 5.1 g/dL 4.2 4.6 (None)  Some recent data might be hidden   Lab Results  Component Value Date   TSH 2.60 04/20/2015   Lab Results  Component Value Date   BUN 8 06/17/2016   BUN 9 02/12/2016   BUN 8 04/20/2015   Lab Results  Component Value Date   HGBA1C 5.9 (H) 06/17/2016   HGBA1C 6.0 (H) 02/12/2016    Assessment/Plan  1. Late onset Alzheimer's disease without behavioral disturbance ADD:  Memantine HCl ER 7 & 14 & 21 &28 MG CP24; One daily to help preserve memory  Dispense: 28 capsule; Refill: 0  2. Type 2 diabetes mellitus without complication, without long-term current use of insulin ([facility]) Controlled by diet  3. Essential hypertension Controlled without medication  4. Other hyperlipidemia Runs a little high, but I have chosen not to treat.  5. Depression, unspecified depression type In remission. No medication.

## 2016-07-16 ENCOUNTER — Encounter: Payer: Self-pay | Admitting: Internal Medicine

## 2016-07-17 ENCOUNTER — Telehealth: Payer: Self-pay | Admitting: *Deleted

## 2016-07-17 NOTE — Telephone Encounter (Signed)
Received Prior Authorization form from [employer] [id number] for Exelon Patch.  ID#: [id number] Placed in Dr. Rolly Salter folder to review MH#:[id number] to be faxed back to [place] Fax: [phone]

## 2016-07-18 ENCOUNTER — Telehealth: Payer: Self-pay | Admitting: Internal Medicine

## 2016-07-18 NOTE — Telephone Encounter (Signed)
Medication, Rivastigmine transdermal system 9.5 mg/24, was approved with [employer] through 03/31/2017.   Faxed over to pharmacy ([place]). Fax number [phone].

## 2016-07-19 NOTE — Telephone Encounter (Signed)
I called [employer] to inform them RX approved, we are unable to fax to [facility]

## 2016-07-23 ENCOUNTER — Encounter: Payer: Self-pay | Admitting: Internal Medicine

## 2016-07-23 ENCOUNTER — Non-Acute Institutional Stay: Payer: Medicare Other | Admitting: Internal Medicine

## 2016-07-23 VITALS — BP 128/76 | HR 66 | Temp 97.6°F | Ht 60.0 in | Wt 141.0 lb

## 2016-07-23 DIAGNOSIS — F0280 Dementia in other diseases classified elsewhere without behavioral disturbance: Secondary | ICD-10-CM

## 2016-07-23 DIAGNOSIS — I10 Essential (primary) hypertension: Secondary | ICD-10-CM

## 2016-07-23 DIAGNOSIS — F329 Major depressive disorder, single episode, unspecified: Secondary | ICD-10-CM | POA: Diagnosis not present

## 2016-07-23 DIAGNOSIS — F32A Depression, unspecified: Secondary | ICD-10-CM

## 2016-07-23 DIAGNOSIS — G301 Alzheimer's disease with late onset: Secondary | ICD-10-CM | POA: Diagnosis not present

## 2016-07-23 MED ORDER — MEMANTINE HCL ER 28 MG PO CP24: ORAL_CAPSULE | ORAL | 5 refills | Status: DC

## 2016-07-23 NOTE — Progress Notes (Signed)
Facility  [facility]    Place of Service: [facility] (12)     Allergies  Allergen Reactions  . Aricept [Donepezil Hcl] Diarrhea  . Crestor [Rosuvastatin Calcium]     Myalgia  . Lovastatin     Myalgia  . Micardis [Telmisartan]     Fatigue  . Sertraline Diarrhea  . Welchol [Colesevelam Hcl]     Constipation  . Zetia [Ezetimibe]     Myalgia  . Zocor [Simvastatin]     Myalgia    Chief Complaint  Patient presents with  . Medical Management of Chronic Issues    3-4 week medication management for memory after adding Memantine, review labs.     HPI:  Late onset Alzheimer's disease without behavioral disturbance - toleratinbg memantine. No nausea, anxiety or palpitations  Depression, unspecified depression type - stable to improved. Not on any medications  Essential hypertension - controlled    Medications: Patient's Medications  New Prescriptions   No medications on file  Previous Medications   CETIRIZINE (ZYRTEC) 10 MG TABLET    Take 10 mg by mouth. Take one daily for allergies   MEMANTINE HCL ER 7 & 14 & 21 &28 MG CP24    One daily to help preserve memory   RIVASTIGMINE (EXELON) 9.5 MG/24HR    Apply fresh patch daily and remove old patch to help preserve memory  Modified Medications   No medications on file  Discontinued Medications   No medications on file     Review of Systems  Constitutional: Negative for activity change, appetite change, chills, diaphoresis, fatigue, fever and unexpected weight change.  HENT: Negative for congestion, ear discharge, ear pain, hearing loss, postnasal drip, rhinorrhea, sore throat, tinnitus, trouble swallowing and voice change.   Eyes: Negative for pain, redness, itching and visual disturbance.  Respiratory: Negative for cough, choking, shortness of breath and wheezing.        Pulmonary infection in 2004 which resulted in surgery with decortication of the right pleura due to an empyema.  Cardiovascular: Negative for chest pain,  palpitations and leg swelling.  Gastrointestinal: Negative for abdominal distention, abdominal pain, constipation, diarrhea and nausea.  Endocrine: Negative for cold intolerance, heat intolerance, polydipsia, polyphagia and polyuria.       Diabetes mellitus. Diet controlled.  Genitourinary: Negative for difficulty urinating, dysuria, flank pain, frequency, hematuria, pelvic pain, urgency and vaginal discharge.  Musculoskeletal: Negative for arthralgias, back pain, gait problem, myalgias, neck pain and neck stiffness.  Skin: Negative for color change, pallor and rash.  Allergic/Immunologic: Negative.   Neurological: Negative for dizziness, tremors, seizures, syncope, weakness, numbness and headaches.       Progressive memory loss since 2014. Alzheimer's disease is most likely diagnosis. Brain scans did show cerebral atrophy. Unable to name objects.   Hematological: Negative for adenopathy. Does not bruise/bleed easily.  Psychiatric/Behavioral: Positive for confusion and decreased concentration. Negative for agitation, behavioral problems, dysphoric mood, hallucinations, sleep disturbance and suicidal ideas. The patient is not nervous/anxious and is not hyperactive.     Vitals:   07/23/16 0846  BP: 128/76  Pulse: 66  Temp: 97.6 F (36.4 C)  TempSrc: Oral  SpO2: 99%  Weight: 141 lb (64 kg)  Height: 5' (1.524 m)   Wt Readings from Last 3 Encounters:  07/23/16 141 lb (64 kg)  07/02/16 143 lb (64.9 kg)  02/20/16 141 lb (64 kg)    Body mass index is 27.54 kg/m.  Physical Exam  Constitutional: She is oriented to person, place, and  time. She appears well-developed and well-nourished. No distress.  HENT:  Right Ear: External ear normal.  Left Ear: External ear normal.  Nose: Nose normal.  Mouth/Throat: Oropharynx is clear and moist. No oropharyngeal exudate.  Eyes: Conjunctivae and EOM are normal. Pupils are equal, round, and reactive to light. No scleral icterus.  Neck: No JVD  present. No tracheal deviation present. No thyromegaly present.  Cardiovascular: Normal rate, regular rhythm, normal heart sounds and intact distal pulses.  Exam reveals no gallop and no friction rub.   No murmur heard. Pulmonary/Chest: Effort normal. No respiratory distress. She has no wheezes. She has no rales. She exhibits no tenderness.  Abdominal: She exhibits no distension and no mass. There is no tenderness.  Musculoskeletal: Normal range of motion. She exhibits no edema or tenderness.  Lymphadenopathy:    She has no cervical adenopathy.  Neurological: She is alert and oriented to person, place, and time. No cranial nerve deficit. Coordination normal.  05/24/2014 MMSE 24/30. Failed clock drawing 07/02/16 MMSE 13/30. Failed clock drawing.  Skin: No rash noted. She is not diaphoretic. No erythema. No pallor.  Scar right chest wall.  Psychiatric: She has a normal mood and affect. Her behavior is normal. Judgment and thought content normal.     Labs reviewed: Lab Summary Latest Ref Rng & Units 06/17/2016 02/12/2016 04/20/2015  Hemoglobin 13.0-17.0 g/dL (None) (None) (None)  Hematocrit 39.0-52.0 % (None) (None) (None)  White count - (None) (None) (None)  Platelet count - (None) (None) (None)  Sodium 135 - 146 mmol/L 140 140 137  Potassium 3.5 - 5.3 mmol/L 4.2 4.4 4.3  Calcium 8.6 - 10.4 mg/dL 9.3 9.5 (None)  Phosphorus - (None) (None) (None)  Creatinine 0.60 - 0.88 mg/dL 0.62 0.71 0.7  AST 10 - 35 U/L _0 Alk Phos 33 - 130 U/L 59 71 72  Bilirubin 0.2 - 1.2 mg/dL 0.9 1.0 (None)  Glucose 65 - 99 mg/dL 106(H) 98 107  Cholesterol <200 mg/dL 227(H) 269(H) 239(A)  HDL cholesterol >50 mg/dL 44(L) 49(L) 52  Triglycerides <150 mg/dL 207(H) 263(H) 135  LDL Direct - (None) (None) (None)  LDL Calc <100 mg/dL 142(H) 167(H) 160  Total protein 6.1 - 8.1 g/dL 6.9 7.3 (None)  Albumin 3.6 - 5.1 g/dL 4.2 4.6 (None)  Some recent data might be hidden   Lab Results  Component Value Date    TSH 2.60 04/20/2015   Lab Results  Component Value Date   BUN 8 06/17/2016   BUN 9 02/12/2016   BUN 8 04/20/2015   Lab Results  Component Value Date   CREATININE 0.62 06/17/2016   CREATININE 0.71 02/12/2016   CREATININE 0.7 04/20/2015   Lab Results  Component Value Date   HGBA1C 5.9 (H) 06/17/2016   HGBA1C 6.0 (H) 02/12/2016       Assessment/Plan  1. Late onset Alzheimer's disease without behavioral disturbance - continue rivastigmine patch - memantine (NAMENDA XR) 28 MG CP24 24 hr capsule; One daily to help preserve memory  Dispense: 30 capsule; Refill: 5  2. Depression, unspecified depression type observe  3. Essential hypertension Normal BP off medications

## 2016-08-14 ENCOUNTER — Encounter: Payer: Self-pay | Admitting: Internal Medicine

## 2016-09-02 ENCOUNTER — Encounter: Payer: Self-pay | Admitting: Internal Medicine

## 2016-09-11 ENCOUNTER — Encounter: Payer: Self-pay | Admitting: Internal Medicine

## 2016-09-13 ENCOUNTER — Other Ambulatory Visit: Payer: Self-pay | Admitting: Internal Medicine

## 2016-09-14 ENCOUNTER — Other Ambulatory Visit: Payer: Self-pay | Admitting: Internal Medicine

## 2016-09-16 ENCOUNTER — Other Ambulatory Visit: Payer: Self-pay | Admitting: Internal Medicine

## 2016-09-16 ENCOUNTER — Other Ambulatory Visit: Payer: Self-pay | Admitting: *Deleted

## 2016-09-16 DIAGNOSIS — F0280 Dementia in other diseases classified elsewhere without behavioral disturbance: Secondary | ICD-10-CM

## 2016-09-16 DIAGNOSIS — G301 Alzheimer's disease with late onset: Principal | ICD-10-CM

## 2016-09-16 MED ORDER — MEMANTINE HCL ER 28 MG PO CP24: ORAL_CAPSULE | ORAL | 5 refills | Status: DC

## 2016-09-16 NOTE — Telephone Encounter (Signed)
[facility]  

## 2016-11-19 ENCOUNTER — Encounter: Payer: Medicare Other | Admitting: Internal Medicine

## 2016-11-20 ENCOUNTER — Encounter: Payer: Self-pay | Admitting: Internal Medicine

## 2016-11-20 ENCOUNTER — Non-Acute Institutional Stay: Payer: Medicare Other | Admitting: Internal Medicine

## 2016-11-20 VITALS — BP 122/70 | HR 65 | Temp 97.8°F | Resp 18 | Ht 60.0 in | Wt 139.6 lb

## 2016-11-20 DIAGNOSIS — G301 Alzheimer's disease with late onset: Secondary | ICD-10-CM

## 2016-11-20 DIAGNOSIS — F0280 Dementia in other diseases classified elsewhere without behavioral disturbance: Secondary | ICD-10-CM | POA: Diagnosis not present

## 2016-11-20 DIAGNOSIS — M8580 Other specified disorders of bone density and structure, unspecified site: Secondary | ICD-10-CM

## 2016-11-20 DIAGNOSIS — E7849 Other hyperlipidemia: Secondary | ICD-10-CM

## 2016-11-20 DIAGNOSIS — R7303 Prediabetes: Secondary | ICD-10-CM | POA: Insufficient documentation

## 2016-11-20 DIAGNOSIS — I10 Essential (primary) hypertension: Secondary | ICD-10-CM

## 2016-11-20 DIAGNOSIS — E784 Other hyperlipidemia: Secondary | ICD-10-CM | POA: Diagnosis not present

## 2016-11-20 NOTE — Progress Notes (Signed)
  [facility]  Provider:   MD   Location:  [facility]   Place of Service:  Clinic (12)  PCP: , , MD Patient Care Team: , , MD as PCP - General (Internal Medicine) Houston, Frank, MD as Consulting Physician (Dermatology) Cole, Tara, MD as Consulting Physician (Obstetrics and Gynecology) Gioffre, Ronald, MD as Consulting Physician (Orthopedic Surgery) Johnson, Martin K, MD as Consulting Physician (Gastroenterology) Edmunds, John H., MD as Consulting Physician (Cardiology) Kozlow, Eric J, MD as Consulting Physician (Allergy and Immunology) Owen, Clarence H, MD as Consulting Physician (Cardiothoracic Surgery) Bates, Dwight, MD as Consulting Physician (Otolaryngology) Green, Arthur G, MD as Consulting Physician (Internal Medicine)  Extended Emergency Contact Information Primary Emergency Contact: Sheeran-Smith,[name]  [place] Home Phone: [phone] Mobile Phone: [phone] Relation: Legal Guardian Secondary Emergency Contact: Toops,David  [place] Home Phone: [phone] Relation: Uncle   Goals of Care: Advanced Directive information Advanced Directives 07/23/2016  Does Patient Have a Medical Advance Directive? Yes  Type of Advance Directive Healthcare Power of Attorney  Does patient want to make changes to medical advance directive? -  Copy of Healthcare Power of Attorney in Chart? Yes  Pre-existing out of facility DNR order (yellow form or pink MOST form) -      Chief Complaint  Patient presents with  . Medical Management of Chronic Issues    4 month follow up. Patient stated that she has no concerns at this time  . Medication Refill    Namenda and Rivastigmine pending orders    HPI: Patient is a 83 y.o. female seen today for routine visit. She denies any concern this visit.  Alzheimer's disease- late onset, she gets driven around by her helper, stays in independent  living, independent with her other ADLs. She self administers her medication. Currently on exelon patch and memantine  Allergic rhinitis- stable, on claritin daily  HTN- controlled, not on any medication  Hiatal hernia with GERD- stable, careful with her meals, denies any symptom  Past Medical History:  Diagnosis Date  . Abnormal CT scan, chest September 2011   Scar tissue  . Allergic rhinitis   . Alzheimer's dementia   . Asthma   . Diabetes mellitus    borderline and under control-diet ,exercise  . Esophageal stricture   . Family history of colon cancer    Mother  . GERD (gastroesophageal reflux disease)   . Hiatal hernia   . HLD (hyperlipidemia)   . Hypertension   . Osteopenia   . Pneumonia 12-15 yrs.ago   yrs. ago  . TIA (transient ischemic attack)   . Vitamin D deficiency    Past Surgical History:  Procedure Laterality Date  . ABDOMINAL HYSTERECTOMY  1980   . CATARACT EXTRACTION Left 2014  . SHOULDER OPEN ROTATOR CUFF REPAIR  05/22/2011   Procedure: ROTATOR CUFF REPAIR SHOULDER OPEN;  Surgeon: Ronald A Gioffre, MD;  Location: [facility];  Service: Orthopedics;  Laterality: Left;  . THORACOTOMY / DECORTICATION PARIETAL PLEURA Right 204   empyema  . TONSILLECTOMY      reports that she quit smoking about 29 years ago. Her smoking use included Cigarettes. She has a 30.00 pack-year smoking history. She has never used smokeless tobacco. She reports that she drinks about 0.6 oz of alcohol per week . She reports that she does not use drugs. Social History   Social History  . Marital status: Single    Spouse name: N/A  . Number of   children: N/A  . Years of education: N/A   Occupational History  . retired Pharmacist, hospital    Social History Main Topics  . Smoking status: Former Smoker    Packs/day: 1.50    Years: 20.00    Types: Cigarettes    Quit date: 05/15/1987  . Smokeless tobacco: Never Used  . Alcohol use 0.6 oz/week    1 Glasses of wine per week     Comment:  occassionally  . Drug use: No  . Sexual activity: No   Other Topics Concern  . Not on file   Social History Narrative   Lives at [facility] since 07/27/2014   Previous math Pharmacist, hospital.   Never married   Former smoker stopped 1989   Alcohol occasionally    POA niece Terance Hart [place], [place]       Functional Status Survey:    History reviewed. No pertinent family history.  Health Maintenance  Topic Date Due  . FOOT EXAM  12/06/1942  . OPHTHALMOLOGY EXAM  12/06/1942  . DEXA SCAN  12/07/1999  . INFLUENZA VACCINE  12/30/2016 (Originally 10/30/2016)  . HEMOGLOBIN A1C  12/18/2016  . URINE MICROALBUMIN  02/11/2017  . TETANUS/TDAP  12/16/2021  . PNA vac Low Risk Adult  Completed    Allergies  Allergen Reactions  . Aricept [Donepezil Hcl] Diarrhea  . Crestor [Rosuvastatin Calcium]     Myalgia  . Lovastatin     Myalgia  . Micardis [Telmisartan]     Fatigue  . Sertraline Diarrhea  . Welchol [Colesevelam Hcl]     Constipation  . Zetia [Ezetimibe]     Myalgia  . Zocor [Simvastatin]     Myalgia    Outpatient Encounter Prescriptions as of 11/20/2016  Medication Sig  . cetirizine (ZYRTEC) 10 MG tablet Take 10 mg by mouth at bedtime.   . memantine (NAMENDA XR) 28 MG CP24 24 hr capsule Take One tablet by mouth once daily to help preserve memory  . rivastigmine (EXELON) 9.5 mg/24hr Apply fresh patch daily and remove old patch to help preserve memory   No facility-administered encounter medications on file as of 11/20/2016.     Review of Systems  Constitutional: Negative for appetite change, chills, diaphoresis, fever and unexpected weight change.  HENT: Negative for congestion, ear discharge, ear pain, hearing loss, mouth sores, rhinorrhea, sinus pain, sinus pressure, sore throat and trouble swallowing.   Eyes: Negative for pain, redness and itching.       Has corrective lenses  Respiratory: Negative for cough, choking, shortness of breath and wheezing.        Has  history of allergies.   Cardiovascular: Negative for chest pain, palpitations and leg swelling.  Gastrointestinal: Negative for abdominal pain, blood in stool, constipation, diarrhea, nausea and vomiting.  Genitourinary: Negative for dysuria, frequency and hematuria.  Musculoskeletal: Positive for arthralgias. Negative for back pain and gait problem.  Skin: Negative for rash and wound.  Neurological: Negative for dizziness, seizures, syncope, light-headedness, numbness and headaches.  Hematological: Bruises/bleeds easily.  Psychiatric/Behavioral: Positive for confusion. Negative for behavioral problems and sleep disturbance. The patient is not nervous/anxious.     Vitals:   11/20/16 0841  BP: 122/70  Pulse: 65  Resp: 18  Temp: 97.8 F (36.6 C)  TempSrc: Oral  SpO2: 98%  Weight: 139 lb 9.6 oz (63.3 kg)  Height: 5' (1.524 m)   Body mass index is 27.26 kg/m.   Wt Readings from Last 3 Encounters:  11/20/16 139 lb 9.6 oz (  63.3 kg)  07/23/16 141 lb (64 kg)  07/02/16 143 lb (64.9 kg)    Physical Exam  Constitutional: She appears well-developed and well-nourished. No distress.  HENT:  Head: Normocephalic and atraumatic.  Right Ear: External ear normal.  Left Ear: External ear normal.  Nose: Nose normal.  Mouth/Throat: Oropharynx is clear and moist. No oropharyngeal exudate.  Eyes: Pupils are equal, round, and reactive to light. Conjunctivae and EOM are normal. Right eye exhibits no discharge. Left eye exhibits no discharge. No scleral icterus.  Neck: Normal range of motion. Neck supple. No JVD present. No tracheal deviation present. No thyromegaly present.  Cardiovascular: Normal rate and regular rhythm.   No murmur heard. Pulmonary/Chest: Effort normal and breath sounds normal. No respiratory distress. She has no wheezes. She has no rales.  Abdominal: Soft. Bowel sounds are normal. She exhibits no distension. There is no tenderness. There is no guarding.  Musculoskeletal:  Normal range of motion. She exhibits no edema.  Varicose veins  Lymphadenopathy:    She has no cervical adenopathy.  Neurological: She is alert.  Oriented to to person , place and month but not to the year  Skin: Skin is warm and dry. No rash noted. She is not diaphoretic. No erythema. No pallor.  Psychiatric: She has a normal mood and affect. Her behavior is normal.    Labs reviewed: Basic Metabolic Panel:  Recent Labs  02/12/16 0735 06/17/16 0000  NA 140 140  K 4.4 4.2  CL 103 104  CO2 29 26  GLUCOSE 98 106*  BUN 9 8  CREATININE 0.71 0.62  CALCIUM 9.5 9.3   Liver Function Tests:  Recent Labs  02/12/16 0735 06/17/16 0000  AST 21 23  ALT 13 18  ALKPHOS 71 59  BILITOT 1.0 0.9  PROT 7.3 6.9  ALBUMIN 4.6 4.2   No results for input(s): LIPASE, AMYLASE in the last 8760 hours. No results for input(s): AMMONIA in the last 8760 hours. CBC: No results for input(s): WBC, NEUTROABS, HGB, HCT, MCV, PLT in the last 8760 hours. Cardiac Enzymes: No results for input(s): CKTOTAL, CKMB, CKMBINDEX, TROPONINI in the last 8760 hours. BNP: Invalid input(s): POCBNP Lab Results  Component Value Date   HGBA1C 5.9 (H) 06/17/2016   Lab Results  Component Value Date   TSH 2.60 04/20/2015   No results found for: VITAMINB12 No results found for: FOLATE No results found for: IRON, TIBC, FERRITIN  Lipid Panel:  Recent Labs  02/12/16 0735 06/17/16 0000  CHOL 269* 227*  HDL 49* 44*  LDLCALC 167* 142*  TRIG 263* 207*  CHOLHDL 5.5* 5.2*   Lab Results  Component Value Date   HGBA1C 5.9 (H) 06/17/2016    Procedures since last visit: No results found.  Assessment/Plan  1. Essential hypertension Stable BP reading, off antihypertensive, monitor clinically - CMP with eGFR; Future - CBC with Differential/Platelets; Future  2. Late onset Alzheimer's disease without behavioral disturbance MMSE 05/2014 24/30, obtain MMSE next visit. Continue namenda and exelon patch for now.  Supportive care.  - TSH; Future  3. Osteopenia, unspecified location Obtain dexa and consider calcium-vit d supplement - DG Bone Density; Future  4. Other hyperlipidemia Not on statin due to allergy, LDL elevated in 3/18 lab, recheck lipid panel - Lipid Panel; Future - TSH; Future  5. Prediabetes - Hemoglobin A1c; Future   Labs/tests ordered:  Lipid panel, a1c, cbc, cmp, tsh  Next appointment: 4 months  Communication:  I spent 25 minutes in total face-to-face  time with the patient, more than 50% of which was spent in counseling and coordination of care, reviewing test results, reviewing medication and discussing or reviewing the diagnosis with patient.       , MD Internal Medicine [facility] [address] [place], [place] [address] Cell Phone (Monday-Friday 8 am - 5 pm): [phone] On Call: [phone] and follow prompts after 5 pm and on weekends Office Phone: [phone] Office Fax: [phone] 

## 2016-11-21 ENCOUNTER — Encounter: Payer: Medicare Other | Admitting: Internal Medicine

## 2016-12-04 DIAGNOSIS — E784 Other hyperlipidemia: Secondary | ICD-10-CM | POA: Diagnosis not present

## 2016-12-04 DIAGNOSIS — I10 Essential (primary) hypertension: Secondary | ICD-10-CM | POA: Diagnosis not present

## 2016-12-04 DIAGNOSIS — G301 Alzheimer's disease with late onset: Secondary | ICD-10-CM | POA: Diagnosis not present

## 2016-12-04 DIAGNOSIS — R7303 Prediabetes: Secondary | ICD-10-CM | POA: Diagnosis not present

## 2016-12-04 DIAGNOSIS — F0280 Dementia in other diseases classified elsewhere without behavioral disturbance: Secondary | ICD-10-CM | POA: Diagnosis not present

## 2016-12-06 ENCOUNTER — Ambulatory Visit
Admission: RE | Admit: 2016-12-06 | Discharge: 2016-12-06 | Disposition: A | Discharge status: Home / Self Care | Payer: Self-pay | Source: Ambulatory Visit | Attending: Internal Medicine | Admitting: Internal Medicine

## 2016-12-06 DIAGNOSIS — M8580 Other specified disorders of bone density and structure, unspecified site: Secondary | ICD-10-CM

## 2016-12-06 LAB — COMPLETE METABOLIC PANEL WITH GFR
AG RATIO: 1.9 (calc) (ref 1.0–2.5)
ALT: 14 U/L (ref 6–29)
AST: 21 U/L (ref 10–35)
Albumin: 4.5 g/dL (ref 3.6–5.1)
Alkaline phosphatase (APISO): 69 U/L (ref 33–130)
BILIRUBIN TOTAL: 1.6 mg/dL — AB (ref 0.2–1.2)
BUN: 10 mg/dL (ref 7–25)
CHLORIDE: 103 mmol/L (ref 98–110)
CO2: 27 mmol/L (ref 20–32)
Calcium: 9.4 mg/dL (ref 8.6–10.4)
Creat: 0.69 mg/dL (ref 0.60–0.88)
GFR, EST AFRICAN AMERICAN: 95 mL/min/{1.73_m2} (ref >=60)
GFR, Est Non African American: 82 mL/min/{1.73_m2} (ref >=60)
Globulin: 2.4 g/dL (calc) (ref 1.9–3.7)
Glucose, Bld: 92 mg/dL (ref 65–99)
POTASSIUM: 4.2 mmol/L (ref 3.5–5.3)
Sodium: 140 mmol/L (ref 135–146)
TOTAL PROTEIN: 6.9 g/dL (ref 6.1–8.1)

## 2016-12-06 LAB — CBC WITH DIFFERENTIAL/PLATELET
Basophils Absolute: 81 {cells}/uL (ref 0–200)
Basophils Relative: 1 %
Eosinophils Absolute: 219 {cells}/uL (ref 15–500)
Eosinophils Relative: 2.7 %
HCT: 39 % (ref 35.0–45.0)
Hemoglobin: 13.1 g/dL (ref 11.7–15.5)
Lymphs Abs: 2406 {cells}/uL (ref 850–3900)
MCH: 28.9 pg (ref 27.0–33.0)
MCHC: 33.6 g/dL (ref 32.0–36.0)
MCV: 86.1 fL (ref 80.0–100.0)
MPV: 11.3 fL (ref 7.5–12.5)
Monocytes Relative: 10.5 %
Neutro Abs: 4544 {cells}/uL (ref 1500–7800)
Neutrophils Relative %: 56.1 %
Platelets: 287 Thousand/uL (ref 140–400)
RBC: 4.53 Million/uL (ref 3.80–5.10)
RDW: 14.6 % (ref 11.0–15.0)
Total Lymphocyte: 29.7 %
WBC mixed population: 851 {cells}/uL (ref 200–950)
WBC: 8.1 Thousand/uL (ref 3.8–10.8)

## 2016-12-06 LAB — LIPID PANEL
Cholesterol: 269 mg/dL — ABNORMAL HIGH (ref <200)
HDL: 54 mg/dL (ref >50)
LDL Cholesterol (Calc): 187 mg/dL (calc) — ABNORMAL HIGH
Non-HDL Cholesterol (Calc): 215 mg/dL (calc) — ABNORMAL HIGH (ref <130)
Total CHOL/HDL Ratio: 5 (calc) — ABNORMAL HIGH (ref <5.0)
Triglycerides: 138 mg/dL (ref <150)

## 2016-12-06 LAB — HEMOGLOBIN A1C
HEMOGLOBIN A1C: 6 %{Hb} — AB (ref <5.7)
Mean Plasma Glucose: 126 (calc)
eAG (mmol/L): 7 (calc)

## 2016-12-06 LAB — TSH: TSH: 3.87 m[IU]/L (ref 0.40–4.50)

## 2016-12-09 ENCOUNTER — Encounter: Payer: Self-pay | Admitting: Internal Medicine

## 2016-12-09 ENCOUNTER — Non-Acute Institutional Stay: Payer: Medicare Other | Admitting: Internal Medicine

## 2016-12-09 VITALS — BP 118/62 | HR 75 | Temp 97.7°F | Resp 16 | Ht 60.0 in | Wt 139.8 lb

## 2016-12-09 DIAGNOSIS — E7849 Other hyperlipidemia: Secondary | ICD-10-CM

## 2016-12-09 DIAGNOSIS — R7303 Prediabetes: Secondary | ICD-10-CM

## 2016-12-09 DIAGNOSIS — M810 Age-related osteoporosis without current pathological fracture: Secondary | ICD-10-CM | POA: Insufficient documentation

## 2016-12-09 DIAGNOSIS — E784 Other hyperlipidemia: Secondary | ICD-10-CM

## 2016-12-09 MED ORDER — ALENDRONATE SODIUM 70 MG PO TABS: 70.0000 mg | ORAL_TABLET | ORAL | 11 refills | Status: DC

## 2016-12-09 MED ORDER — CALCIUM CARBONATE-VITAMIN D 500-200 MG-UNIT PO TABS: 1.0000 | ORAL_TABLET | Freq: Two times a day (BID) | ORAL | 3 refills | Status: DC

## 2016-12-09 NOTE — Progress Notes (Signed)
[facility]  Provider: Blanchie Serve MD   Location:  [facility]   Place of Service:  Clinic (12)  PCP: Blanchie Serve, MD Patient Care Team: Blanchie Serve, MD as PCP - General (Internal Medicine) Lindwood Coke, MD as Consulting Physician (Dermatology) Christophe Louis, MD as Consulting Physician (Obstetrics and Gynecology) Latanya Maudlin, MD as Consulting Physician (Orthopedic Surgery) Garlan Fair, MD as Consulting Physician (Gastroenterology) Barnett Abu., MD as Consulting Physician (Cardiology) Neldon Mc, Donnamarie Poag, MD as Consulting Physician (Allergy and Immunology) Rexene Alberts, MD as Consulting Physician (Cardiothoracic Surgery) Melida Quitter, MD as Consulting Physician (Otolaryngology) Nyoka Cowden Viviann Spare, MD as Consulting Physician (Internal Medicine)  Extended Emergency Contact Information Primary Emergency Contact: Kathie [place] of [place] Phone: [phone] Mobile Phone: [phone] Relation: Legal Guardian Secondary Emergency Contact: Leana [place] Phone: [phone] Relation: Uncle   Goals of Care: Advanced Directive information Advanced Directives 07/23/2016  Does Patient Have a Medical Advance Directive? Yes  Type of Advance Directive [place]  Does patient want to make changes to medical advance directive? -  Copy of [place] in Chart? Yes  Pre-existing out of facility DNR order (yellow form or pink MOST form) -      Chief Complaint  Patient presents with  . Acute Visit    Discuss labs and results with patient  . Medication Refill    No refills needed at this time.     HPI: Patient is a 83 y.o. female seen today for abnormal dexa scan. She underwent dexa scan and T score is -2.6. routine visit. She was confused about starting her medication for osteoporosis and has set this appointment to go over her report and treatment  plan. She has history of Alzheimer's disease.   Past Medical History:  Diagnosis Date  . Abnormal CT scan, chest September 2011   Scar tissue  . Allergic rhinitis   . Alzheimer's dementia   . Asthma   . Diabetes mellitus    borderline and under control-diet ,exercise  . Esophageal stricture   . Family history of colon cancer    Mother  . GERD (gastroesophageal reflux disease)   . Hiatal hernia   . HLD (hyperlipidemia)   . Hypertension   . Osteopenia   . Pneumonia 12-15 yrs.ago   yrs. ago  . TIA (transient ischemic attack)   . Vitamin D deficiency    Past Surgical History:  Procedure Laterality Date  . ABDOMINAL HYSTERECTOMY  1980   . CATARACT EXTRACTION Left 2014  . SHOULDER OPEN ROTATOR CUFF REPAIR  05/22/2011   Procedure: ROTATOR CUFF REPAIR SHOULDER OPEN;  Surgeon: Tobi Bastos, MD;  Location: [facility];  Service: Orthopedics;  Laterality: Left;  . THORACOTOMY / DECORTICATION PARIETAL PLEURA Right 204   empyema  . TONSILLECTOMY      reports that she quit smoking about 29 years ago. Her smoking use included Cigarettes. She has a 30.00 pack-year smoking history. She has never used smokeless tobacco. She reports that she drinks about 0.6 oz of alcohol per week . She reports that she does not use drugs. Social History   Social History  . Marital status: Single    Spouse name: N/A  . Number of children: N/A  . Years of education: N/A   Occupational History  . retired Pharmacist, hospital    Social History Main Topics  . Smoking status: Former Smoker    Packs/day:  1.50    Years: 20.00    Types: Cigarettes    Quit date: 05/15/1987  . Smokeless tobacco: Never Used  . Alcohol use 0.6 oz/week    1 Glasses of wine per week     Comment: occassionally  . Drug use: No  . Sexual activity: No   Other Topics Concern  . Not on file   Social History Narrative   Lives at [facility] since 07/27/2014   Previous math Pharmacist, hospital.   Never married   Former smoker stopped 1989    Alcohol occasionally    POA niece Terance Hart [place], [place]       Functional Status Survey:    History reviewed. No pertinent family history.  Health Maintenance  Topic Date Due  . FOOT EXAM  12/06/1942  . OPHTHALMOLOGY EXAM  [birth year]06/1942  . INFLUENZA VACCINE  12/30/2016 (Originally 10/30/2016)  . URINE MICROALBUMIN  02/11/2017  . HEMOGLOBIN A1C  06/03/2017  . TETANUS/TDAP  12/16/2021  . DEXA SCAN  Completed  . PNA vac Low Risk Adult  Completed    Allergies  Allergen Reactions  . Aricept [Donepezil Hcl] Diarrhea  . Crestor [Rosuvastatin Calcium]     Myalgia  . Lovastatin     Myalgia  . Micardis [Telmisartan]     Fatigue  . Sertraline Diarrhea  . Welchol [Colesevelam Hcl]     Constipation  . Zetia [Ezetimibe]     Myalgia  . Zocor [Simvastatin]     Myalgia    Outpatient Encounter Prescriptions as of 12/09/2016  Medication Sig  . cetirizine (ZYRTEC) 10 MG tablet Take 10 mg by mouth at bedtime.   . memantine (NAMENDA XR) 28 MG CP24 24 hr capsule Take One tablet by mouth once daily to help preserve memory  . rivastigmine (EXELON) 9.5 mg/24hr Apply fresh patch daily and remove old patch to help preserve memory  . alendronate (FOSAMAX) 70 MG tablet Take 1 tablet (70 mg total) by mouth every 7 (seven) days. Take with a full glass of water on an empty stomach.  . calcium-vitamin D (OSCAL WITH D) 500-200 MG-UNIT tablet Take 1 tablet by mouth 2 (two) times daily.   No facility-administered encounter medications on file as of 12/09/2016.     Review of Systems  Constitutional: Negative for appetite change.  HENT: Negative for sore throat and trouble swallowing.   Eyes:       Has corrective lenses  Respiratory: Negative for cough.        Has history of allergies.   Cardiovascular: Negative for chest pain.  Gastrointestinal: Negative for abdominal pain, constipation, diarrhea, nausea and vomiting.  Endocrine: Negative for polydipsia, polyphagia and polyuria.  Genitourinary:  Positive for frequency. Negative for dysuria and pelvic pain.  Musculoskeletal: Positive for arthralgias. Negative for back pain and gait problem.  Skin: Negative for wound.  Neurological: Negative for dizziness, syncope, light-headedness and headaches.  Hematological: Bruises/bleeds easily.  Psychiatric/Behavioral: Positive for confusion. Negative for behavioral problems and sleep disturbance. The patient is not nervous/anxious.     Vitals:   12/09/16 1613  BP: 118/62  Pulse: 75  Resp: 16  Temp: 97.7 F (36.5 C)  TempSrc: Oral  SpO2: 98%  Weight: 139 lb 12.8 oz (63.4 kg)  Height: 5' (1.524 m)   Body mass index is 27.3 kg/m.   Wt Readings from Last 3 Encounters:  12/09/16 139 lb 12.8 oz (63.4 kg)  11/20/16 139 lb 9.6 oz (63.3 kg)  07/23/16 141 lb (64 kg)  Physical Exam  Constitutional: She appears well-developed and well-nourished. No distress.  HENT:  Head: Normocephalic and atraumatic.  Mouth/Throat: Oropharynx is clear and moist.  Eyes: Pupils are equal, round, and reactive to light. Conjunctivae and EOM are normal. Right eye exhibits no discharge. Left eye exhibits no discharge. No scleral icterus.  Neck: Normal range of motion. Neck supple.  Cardiovascular: Normal rate and regular rhythm.   No murmur heard. Pulmonary/Chest: Effort normal and breath sounds normal.  Abdominal: Soft. Bowel sounds are normal. She exhibits no distension. There is no tenderness.  Musculoskeletal: Normal range of motion. She exhibits no edema.  Varicose veins  Lymphadenopathy:    She has no cervical adenopathy.  Neurological: She is alert.  Oriented to to person , place and month but not to the year  Skin: Skin is warm and dry. No rash noted. She is not diaphoretic. No erythema. No pallor.  Psychiatric: She has a normal mood and affect. Her behavior is normal.    Labs reviewed: Basic Metabolic Panel:  Recent Labs  02/12/16 0735 06/17/16 0000 12/04/16 0000  NA 140 140 140  K  4.4 4.2 4.2  CL 103 104 103  CO2 29 26 27   GLUCOSE 98 106* 92  BUN 9 8 10   CREATININE 0.71 0.62 0.69  CALCIUM 9.5 9.3 9.4   Liver Function Tests:  Recent Labs  02/12/16 0735 06/17/16 0000 12/04/16 0000  AST 21 23 21   ALT 13 18 14   ALKPHOS 71 59  --   BILITOT 1.0 0.9 1.6*  PROT 7.3 6.9 6.9  ALBUMIN 4.6 4.2  --    No results for input(s): LIPASE, AMYLASE in the last 8760 hours. No results for input(s): AMMONIA in the last 8760 hours. CBC:  Recent Labs  12/04/16 0000  WBC 8.1  NEUTROABS 4,544  HGB 13.1  HCT 39.0  MCV 86.1  PLT 287   Cardiac Enzymes: No results for input(s): CKTOTAL, CKMB, CKMBINDEX, TROPONINI in the last 8760 hours. BNP: Invalid input(s): POCBNP Lab Results  Component Value Date   HGBA1C 6.0 (H) 12/04/2016   Lab Results  Component Value Date   TSH 3.87 12/04/2016   No results found for: VITAMINB12 No results found for: FOLATE No results found for: IRON, TIBC, FERRITIN  Lipid Panel:  Recent Labs  02/12/16 0735 06/17/16 0000 12/04/16 0000  CHOL 269* 227* 269*  HDL 49* 44* 54  LDLCALC 167* 142*  --   TRIG 263* 207* 138  CHOLHDL 5.5* 5.2* 5.0*   Lab Results  Component Value Date   HGBA1C 6.0 (H) 12/04/2016    Procedures since last visit: Dg Bone Density  Result Date: 12/06/2016 EXAM: DUAL X-RAY ABSORPTIOMETRY (DXA) FOR BONE MINERAL DENSITY IMPRESSION: Referring Physician:  [facility] Wylder Macomber PATIENT: Name: Grace, Gilbert Patient ID: [id number] Birth Date: 04/14/1932 Height: 60.0 in. Sex: Female Measured: 12/06/2016 Weight: 140.7 lbs. Indications: Advanced Age, Bilateral Ovariectomy (65.51), Caucasian, Estrogen Deficient, Hysterectomy, Postmenopausal Fractures: None Treatments: None ASSESSMENT: The BMD measured at Forearm Radius 33% is 0.655 g/cm2 with a T-score of -2.6. This patient is considered osteoporotic according to [facility]) criteria. Lumbar spine was not utilized due to advanced degenerative changes. Site  Region Measured Date Measured Age YA BMD Significant CHANGE T-score Left Forearm Radius 33% 12/06/2016 82.0 -2.6 0.655 g/cm2 DualFemur Neck Left 12/06/2016 82.0 -0.8 0.920 g/cm2 World Health Organization [facility]) criteria for post-menopausal, Caucasian Women: Normal       T-score at or above -1 SD Osteopenia  T-score between -1 and -2.5 SD Osteoporosis T-score at or below -2.5 SD RECOMMENDATION: [place] recommends that FDA-approved medical therapies be considered in postmenopausal women and men age 36 or older with a: 1. Hip or vertebral (clinical or morphometric) fracture. 2. T-score of <-2.5 at the spine or hip. 3. Ten-year fracture probability by FRAX of 3% or greater for hip fracture or 20% or greater for major osteoporotic fracture. All treatment decisions require clinical judgment and consideration of individual patient factors, including patient preferences, co-morbidities, previous drug use, risk factors not captured in the FRAX model (e.g. falls, vitamin D deficiency, increased bone turnover, interval significant decline in bone density) and possible under - or over-estimation of fracture risk by FRAX. All patients should ensure an adequate intake of dietary calcium (1200 mg/d) and vitamin D (800 IU daily) unless contraindicated. FOLLOW-UP: People with diagnosed cases of osteoporosis or at high risk for fracture should have regular bone mineral density tests. For patients eligible for Medicare, routine testing is allowed once every 2 years. The testing frequency can be increased to one year for patients who have rapidly progressing disease, those who are receiving or discontinuing medical therapy to restore bone mass, or have additional risk factors. I have reviewed this report, and agree with the above findings. [facility] Radiology Electronically Signed   By: Franki Cabot M.D.   On: 12/06/2016 13:31    Assessment/Plan  1. Age-related osteoporosis without current pathological  fracture Start fosamax on weekly basis. Pt explained the benefit from it and common side effect. No assistive device at present. No fall reported. Add calcium and vitamin d supplement as well.  - alendronate (FOSAMAX) 70 MG tablet; Take 1 tablet (70 mg total) by mouth every 7 (seven) days. Take with a full glass of water on an empty stomach.  Dispense: 4 tablet; Refill: 11 - calcium-vitamin D (OSCAL WITH D) 500-200 MG-UNIT tablet; Take 1 tablet by mouth 2 (two) times daily.  Dispense: 60 tablet; Refill: 3  2. Other hyperlipidemia Intolerant to statin. Continue to monitor clinically.   3. Prediabetes Unchanged, monitor clinically.    Next appointment: 4 months    Blanchie Serve, MD Internal Medicine [facility] [address] [place], [place] [address] Cell Phone (Monday-Friday 8 am - 5 pm): [phone] On Call: [phone] and follow prompts after 5 pm and on weekends Office Phone: [phone] Office Fax: [phone]

## 2017-01-14 ENCOUNTER — Telehealth: Payer: Self-pay | Admitting: Internal Medicine

## 2017-01-14 NOTE — Telephone Encounter (Signed)
I called the patient to schedule AWV-S at [facility] clinic on afternoon of 01/22/17 or 01/29/17, but there was no answer and no option to leave a message.

## 2017-01-22 ENCOUNTER — Non-Acute Institutional Stay: Payer: Medicare Other

## 2017-01-22 DIAGNOSIS — Z0000 Encounter for general adult medical examination without abnormal findings: Secondary | ICD-10-CM | POA: Diagnosis not present

## 2017-01-22 NOTE — Progress Notes (Signed)
Subjective:   Grace Gilbert is a 83 y.o. female who presents for Medicare Annual (Subsequent) preventive examination at [facility]  Last AWV-02/09/2014       Objective:     Vitals: There were no vitals taken for this visit.  There is no height or weight on file to calculate BMI.   Tobacco History  Smoking Status  . Former Smoker  . Packs/day: 1.50  . Years: 20.00  . Types: Cigarettes  . Quit date: 05/15/1987  Smokeless Tobacco  . Never Used     Counseling given: Not Answered   Past Medical History:  Diagnosis Date  . Abnormal CT scan, chest September 2011   Scar tissue  . Allergic rhinitis   . Alzheimer's dementia   . Asthma   . Diabetes mellitus    borderline and under control-diet ,exercise  . Esophageal stricture   . Family history of colon cancer    Mother  . GERD (gastroesophageal reflux disease)   . Hiatal hernia   . HLD (hyperlipidemia)   . Hypertension   . Osteopenia   . Pneumonia 12-15 yrs.ago   yrs. ago  . TIA (transient ischemic attack)   . Vitamin D deficiency    Past Surgical History:  Procedure Laterality Date  . ABDOMINAL HYSTERECTOMY  1980   . CATARACT EXTRACTION Left 2014  . SHOULDER OPEN ROTATOR CUFF REPAIR  05/22/2011   Procedure: ROTATOR CUFF REPAIR SHOULDER OPEN;  Surgeon: Tobi Bastos, MD;  Location: [facility];  Service: Orthopedics;  Laterality: Left;  . THORACOTOMY / DECORTICATION PARIETAL PLEURA Right 204   empyema  . TONSILLECTOMY     History reviewed. No pertinent family history. History  Sexual Activity  . Sexual activity: No    Outpatient Encounter Prescriptions as of 01/22/2017  Medication Sig  . alendronate (FOSAMAX) 70 MG tablet Take 1 tablet (70 mg total) by mouth every 7 (seven) days. Take with a full glass of water on an empty stomach.  . calcium-vitamin D (OSCAL WITH D) 500-200 MG-UNIT tablet Take 1 tablet by mouth 2 (two) times daily.  . cetirizine (ZYRTEC) 10 MG tablet Take 10 mg by mouth at  bedtime.   . memantine (NAMENDA XR) 28 MG CP24 24 hr capsule Take One tablet by mouth once daily to help preserve memory  . rivastigmine (EXELON) 9.5 mg/24hr Apply fresh patch daily and remove old patch to help preserve memory   No facility-administered encounter medications on file as of 01/22/2017.     Activities of Daily Living In your present state of health, do you have any difficulty performing the following activities: 01/22/2017  Hearing? N  Vision? N  Difficulty concentrating or making decisions? Y  Walking or climbing stairs? N  Dressing or bathing? N  Doing errands, shopping? Y  Preparing Food and eating ? N  Using the Toilet? N  In the past six months, have you accidently leaked urine? N  Do you have problems with loss of bowel control? N  Managing your Medications? Y  Managing your Finances? Y  Housekeeping or managing your Housekeeping? Y  Some recent data might be hidden    Patient Care Team: Blanchie Serve, MD as PCP - General (Internal Medicine) Lindwood Coke, MD as Consulting Physician (Dermatology) Christophe Louis, MD as Consulting Physician (Obstetrics and Gynecology) Latanya Maudlin, MD as Consulting Physician (Orthopedic Surgery) Garlan Fair, MD as Consulting Physician (Gastroenterology) Barnett Abu., MD as Consulting Physician (Cardiology) Neldon Mc Donnamarie Poag, MD  as Consulting Physician (Allergy and Immunology) Rexene Alberts, MD as Consulting Physician (Cardiothoracic Surgery) Melida Quitter, MD as Consulting Physician (Otolaryngology) Estill Dooms, MD as Consulting Physician (Internal Medicine)    Assessment:      Exercise Activities and Dietary recommendations Current Exercise Habits: Home exercise routine, Type of exercise: stretching, Time (Minutes): 15, Frequency (Times/Week): 7, Weekly Exercise (Minutes/Week): 105, Intensity: Mild, Exercise limited by: neurologic condition(s)  Goals    . Maintaining physical activity          Patient  will maintain her stretches.      Fall Risk Fall Risk  01/22/2017 07/23/2016 04/25/2015 03/13/2015 03/07/2015  Falls in the past year? No No No No No   Depression Screen PHQ 2/9 Scores 01/22/2017 07/23/2016 03/30/2015 03/13/2015  PHQ - 2 Score 0 0 0 0     Cognitive Function MMSE - Mini Mental State Exam 07/02/2016 07/02/2016 05/24/2014  Orientation to time 1 1 2   Orientation to Place 3 3 4   Registration 3 3 3   Attention/ Calculation 0 0 5  Recall 1 1 3   Language- name 2 objects 1 1 2   Language- repeat 0 0 1  Language- follow 3 step command 3 3 2   Language- read & follow direction 0 0 1  Write a sentence 1 1 1   Copy design 0 0 0  Total score 13 13 24         Immunization History  Administered Date(s) Administered  . Influenza-Unspecified 12/23/2013, 12/01/2014, 01/11/2016  . Pneumococcal Conjugate-13 11/22/2013  . Pneumococcal Polysaccharide-23 07/01/2006  . Tdap 12/17/2011  . Zoster 12/25/2005   Screening Tests Health Maintenance  Topic Date Due  . FOOT EXAM  12/06/1942  . OPHTHALMOLOGY EXAM  12/06/1942  . INFLUENZA VACCINE  10/30/2016  . URINE MICROALBUMIN  02/11/2017  . HEMOGLOBIN A1C  06/03/2017  . TETANUS/TDAP  12/16/2021  . DEXA SCAN  Completed  . PNA vac Low Risk Adult  Completed      Plan:    I have personally reviewed and addressed the Medicare Annual Wellness questionnaire and have noted the following in the patient's chart:  A. Medical and social history B. Use of alcohol, tobacco or illicit drugs  C. Current medications and supplements D. Functional ability and status E.  Nutritional status F.  Physical activity G. Advance directives H. List of other physicians I.  Hospitalizations, surgeries, and ER visits in previous 12 months J.  [place] to include hearing, vision, cognitive, depression L. Referrals and appointments - none  In addition, I have reviewed and discussed with patient certain preventive protocols, quality metrics, and best  practice recommendations. A written personalized care plan for preventive services as well as general preventive health recommendations were provided to patient.  See attached scanned questionnaire for additional information.   Signed,   Rich Reining, RN Nurse Health Advisor   Quick Notes   Health Maintenance: Foot exam due     Abnormal Screen: MMSE 13/30 on 07/02/2016.      Patient Concerns: None     Nurse Concerns: None

## 2017-01-22 NOTE — Patient Instructions (Signed)
Grace Gilbert , Thank you for taking time to come for your Medicare Wellness Visit. I appreciate your ongoing commitment to your health goals. Please review the following plan we discussed and let me know if I can assist you in the future.   Screening recommendations/referrals: Colonoscopy excluded, you are over age 81 Mammogram excluded, you are over age 49 Bone Density up to date Recommended yearly ophthalmology/optometry visit for glaucoma screening and checkup Recommended yearly dental visit for hygiene and checkup  Vaccinations: Influenza vaccine up to date. Due 2019 fall season Pneumococcal vaccine up to date Tdap vaccine up to date. Due 12/16/2021 Shingles vaccine due. If you want the updated shingles vaccine let us know and we will sent a prescription to your pharmacy    Advanced directives: In Chart  Conditions/risks identified: None  Next appointment: Dr. Bubba Camp 03/05/2017 @ 9am   Preventive Care 18 Years and Older, Female Preventive care refers to lifestyle choices and visits with your health care provider that can promote health and wellness. What does preventive care include?  A yearly physical exam. This is also called an annual well check.  Dental exams once or twice a year.  Routine eye exams. Ask your health care provider how often you should have your eyes checked.  Personal lifestyle choices, including:  Daily care of your teeth and gums.  Regular physical activity.  Eating a healthy diet.  Avoiding tobacco and drug use.  Limiting alcohol use.  Practicing safe sex.  Taking low-dose aspirin every day.  Taking vitamin and mineral supplements as recommended by your health care provider. What happens during an annual well check? The services and screenings done by your health care provider during your annual well check will depend on your age, overall health, lifestyle risk factors, and family history of disease. Counseling  Your health care provider may  ask you questions about your:  Alcohol use.  Tobacco use.  Drug use.  Emotional well-being.  Home and relationship well-being.  Sexual activity.  Eating habits.  History of falls.  Memory and ability to understand (cognition).  Work and work Statistician.  Reproductive health. Screening  You may have the following tests or measurements:  Height, weight, and BMI.  Blood pressure.  Lipid and cholesterol levels. These may be checked every 5 years, or more frequently if you are over 74 years old.  Skin check.  Lung cancer screening. You may have this screening every year starting at age 35 if you have a 30-pack-year history of smoking and currently smoke or have quit within the past 15 years.  Fecal occult blood test (FOBT) of the stool. You may have this test every year starting at age 84.  Flexible sigmoidoscopy or colonoscopy. You may have a sigmoidoscopy every 5 years or a colonoscopy every 10 years starting at age 35.  Hepatitis C blood test.  Hepatitis B blood test.  Sexually transmitted disease (STD) testing.  Diabetes screening. This is done by checking your blood sugar (glucose) after you have not eaten for a while (fasting). You may have this done every 1-3 years.  Bone density scan. This is done to screen for osteoporosis. You may have this done starting at age 31.  Mammogram. This may be done every 1-2 years. Talk to your health care provider about how often you should have regular mammograms. Talk with your health care provider about your test results, treatment options, and if necessary, the need for more tests. Vaccines  Your health care provider may  recommend certain vaccines, such as:  Influenza vaccine. This is recommended every year.  Tetanus, diphtheria, and acellular pertussis (Tdap, Td) vaccine. You may need a Td booster every 10 years.  Zoster vaccine. You may need this after age 74.  Pneumococcal 13-valent conjugate (PCV13) vaccine. One  dose is recommended after age 43.  Pneumococcal polysaccharide (PPSV23) vaccine. One dose is recommended after age 39. Talk to your health care provider about which screenings and vaccines you need and how often you need them. This information is not intended to replace advice given to you by your health care provider. Make sure you discuss any questions you have with your health care provider. Document Released: 04/14/2015 Document Revised: 12/06/2015 Document Reviewed: 01/17/2015 Elsevier Interactive Patient Education  2017 [place] Prevention in the Home Falls can cause injuries. They can happen to people of all ages. There are many things you can do to make your home safe and to help prevent falls. What can I do on the outside of my home?  Regularly fix the edges of walkways and driveways and fix any cracks.  Remove anything that might make you trip as you walk through a door, such as a raised step or threshold.  Trim any bushes or trees on the path to your home.  Use bright outdoor lighting.  Clear any walking paths of anything that might make someone trip, such as rocks or tools.  Regularly check to see if handrails are loose or broken. Make sure that both sides of any steps have handrails.  Any raised decks and porches should have guardrails on the edges.  Have any leaves, snow, or ice cleared regularly.  Use sand or salt on walking paths during winter.  Clean up any spills in your garage right away. This includes oil or grease spills. What can I do in the bathroom?  Use night lights.  Install grab bars by the toilet and in the tub and shower. Do not use towel bars as grab bars.  Use non-skid mats or decals in the tub or shower.  If you need to sit down in the shower, use a plastic, non-slip stool.  Keep the floor dry. Clean up any water that spills on the floor as soon as it happens.  Remove soap buildup in the tub or shower regularly.  Attach bath  mats securely with double-sided non-slip rug tape.  Do not have throw rugs and other things on the floor that can make you trip. What can I do in the bedroom?  Use night lights.  Make sure that you have a light by your bed that is easy to reach.  Do not use any sheets or blankets that are too big for your bed. They should not hang down onto the floor.  Have a firm chair that has side arms. You can use this for support while you get dressed.  Do not have throw rugs and other things on the floor that can make you trip. What can I do in the kitchen?  Clean up any spills right away.  Avoid walking on wet floors.  Keep items that you use a lot in easy-to-reach places.  If you need to reach something above you, use a strong step stool that has a grab bar.  Keep electrical cords out of the way.  Do not use floor polish or wax that makes floors slippery. If you must use wax, use non-skid floor wax.  Do not have throw rugs and  other things on the floor that can make you trip. What can I do with my stairs?  Do not leave any items on the stairs.  Make sure that there are handrails on both sides of the stairs and use them. Fix handrails that are broken or loose. Make sure that handrails are as long as the stairways.  Check any carpeting to make sure that it is firmly attached to the stairs. Fix any carpet that is loose or worn.  Avoid having throw rugs at the top or bottom of the stairs. If you do have throw rugs, attach them to the floor with carpet tape.  Make sure that you have a light switch at the top of the stairs and the bottom of the stairs. If you do not have them, ask someone to add them for you. What else can I do to help prevent falls?  Wear shoes that:  Do not have high heels.  Have rubber bottoms.  Are comfortable and fit you well.  Are closed at the toe. Do not wear sandals.  If you use a stepladder:  Make sure that it is fully opened. Do not climb a closed  stepladder.  Make sure that both sides of the stepladder are locked into place.  Ask someone to hold it for you, if possible.  Clearly mark and make sure that you can see:  Any grab bars or handrails.  First and last steps.  Where the edge of each step is.  Use tools that help you move around (mobility aids) if they are needed. These include:  Canes.  Walkers.  Scooters.  Crutches.  Turn on the lights when you go into a dark area. Replace any light bulbs as soon as they burn out.  Set up your furniture so you have a clear path. Avoid moving your furniture around.  If any of your floors are uneven, fix them.  If there are any pets around you, be aware of where they are.  Review your medicines with your doctor. Some medicines can make you feel dizzy. This can increase your chance of falling. Ask your doctor what other things that you can do to help prevent falls. This information is not intended to replace advice given to you by your health care provider. Make sure you discuss any questions you have with your health care provider. Document Released: 01/12/2009 Document Revised: 08/24/2015 Document Reviewed: 04/22/2014 Elsevier Interactive Patient Education  2017 [employer].

## 2017-03-05 ENCOUNTER — Encounter: Payer: Self-pay | Admitting: Internal Medicine

## 2017-03-05 ENCOUNTER — Non-Acute Institutional Stay: Payer: Medicare Other | Admitting: Internal Medicine

## 2017-03-05 VITALS — BP 152/94 | HR 88 | Temp 98.0°F | Resp 16 | Ht 60.0 in | Wt 139.0 lb

## 2017-03-05 DIAGNOSIS — R7303 Prediabetes: Secondary | ICD-10-CM

## 2017-03-05 DIAGNOSIS — M810 Age-related osteoporosis without current pathological fracture: Secondary | ICD-10-CM | POA: Diagnosis not present

## 2017-03-05 DIAGNOSIS — Z8739 Personal history of other diseases of the musculoskeletal system and connective tissue: Secondary | ICD-10-CM | POA: Diagnosis not present

## 2017-03-05 DIAGNOSIS — I10 Essential (primary) hypertension: Secondary | ICD-10-CM

## 2017-03-05 DIAGNOSIS — F0280 Dementia in other diseases classified elsewhere without behavioral disturbance: Secondary | ICD-10-CM

## 2017-03-05 DIAGNOSIS — E7849 Other hyperlipidemia: Secondary | ICD-10-CM

## 2017-03-05 DIAGNOSIS — G301 Alzheimer's disease with late onset: Secondary | ICD-10-CM

## 2017-03-05 DIAGNOSIS — Z8673 Personal history of transient ischemic attack (TIA), and cerebral infarction without residual deficits: Secondary | ICD-10-CM

## 2017-03-05 MED ORDER — ASPIRIN EC 81 MG PO TBEC: 81.0000 mg | DELAYED_RELEASE_TABLET | Freq: Every day | ORAL | 3 refills | Status: DC

## 2017-03-05 NOTE — Patient Instructions (Addendum)
   New medication this visit is a baby aspirin. I want you to take this given your history of mini stroke, high cholesterol and borderline diabetes. If you start having stomach ache, nausea, blood in stool after taking aspirin, let us know right away.

## 2017-03-05 NOTE — Progress Notes (Signed)
Grace [facility]  Provider: Blanchie Serve MD   Location:  [facility]   Place of Service:  [facility] (12)  PCP: Blanchie Serve, MD Patient Care Team: Blanchie Serve, MD as PCP - General (Internal Medicine) Lindwood Coke, MD as Consulting Physician (Dermatology) Christophe Louis, MD as Consulting Physician (Obstetrics and Gynecology) Latanya Maudlin, MD as Consulting Physician (Orthopedic Surgery) Garlan Fair, MD as Consulting Physician (Gastroenterology) Barnett Abu., MD as Consulting Physician (Cardiology) Neldon Mc, Donnamarie Poag, MD as Consulting Physician (Allergy and Immunology) Rexene Alberts, MD as Consulting Physician (Cardiothoracic Surgery) Melida Quitter, MD as Consulting Physician (Otolaryngology) Nyoka Cowden Viviann Spare, MD as Consulting Physician (Internal Medicine)  Extended Emergency Contact Information Primary Emergency Contact: Grace Gilbert of [place] Phone: [phone] Mobile Phone: [phone] Relation: Legal Guardian Secondary Emergency Contact: Grace [facility] [place] of [place] Phone: [phone] Relation: Uncle   Goals of Care: Advanced Directive information Advanced Directives 01/22/2017  Does Patient Have a Medical Advance Directive? Yes  Type of Advance Directive [place]  Does patient want to make changes to medical advance directive? No - Patient declined  Copy of [place] in Chart? Yes  Pre-existing out of facility DNR order (yellow form or pink MOST form) -      Chief Complaint  Patient presents with  . Medical Management of Chronic Issues    routine follow up with MMSE    HPI: Patient is a 83 y.o. female seen today for routine visit. She has a caregiver who comes twice a week and helps her with her groceries. She eats one meal in dining area, for other meals she eats dry food. She does not cook.  Osteoporosis- taking fosamax once a week along  with ca-vit d supplement. Denies heartbun or reflux.  Allergic rhinitis- denies any symptom, taking zyrtec daily   Dementia- denies behavioral disturbance. Currently taking namenda xr and exelon patch  Urinary frequency- denies dyuria or hematuria. Denies nocturia. Per caregiver, pt has had increased need to use bathroom during daytime. Wears a pad and rarely has to change it for the day.   Past Medical History:  Diagnosis Date  . Abnormal CT scan, chest September 2011   Scar tissue  . Allergic rhinitis   . Alzheimer's dementia   . Asthma   . Diabetes mellitus    borderline and under control-diet ,exercise  . Esophageal stricture   . Family history of colon cancer    Mother  . GERD (gastroesophageal reflux disease)   . Hiatal hernia   . HLD (hyperlipidemia)   . Hypertension   . Osteopenia   . Pneumonia 12-15 yrs.ago   yrs. ago  . TIA (transient ischemic attack)   . Vitamin D deficiency    Past Surgical History:  Procedure Laterality Date  . ABDOMINAL HYSTERECTOMY  1980   . CATARACT EXTRACTION Left 2014  . SHOULDER OPEN ROTATOR CUFF REPAIR  05/22/2011   Procedure: ROTATOR CUFF REPAIR SHOULDER OPEN;  Surgeon: Tobi Bastos, MD;  Location: [facility];  Service: Orthopedics;  Laterality: Left;  . THORACOTOMY / DECORTICATION PARIETAL PLEURA Right 204   empyema  . TONSILLECTOMY      reports that she quit smoking about 29 years ago. Her smoking use included cigarettes. She has a 30.00 pack-year smoking history. she has never used smokeless tobacco. She reports that she does not drink alcohol or use drugs. Social History   Socioeconomic History  .  Marital status: Single    Spouse name: Not on file  . Number of children: Not on file  . Years of education: Not on file  . Highest education level: Not on file  Social Needs  . Financial resource strain: Not on file  . Food insecurity - worry: Not on file  . Food insecurity - inability: Not on file  . Transportation needs -  medical: Not on file  . Transportation needs - non-medical: Not on file  Occupational History  . Occupation: retired Pharmacist, hospital  Tobacco Use  . Smoking status: Former Smoker    Packs/day: 1.50    Years: 20.00    Pack years: 30.00    Types: Cigarettes    Last attempt to quit: 05/15/1987    Years since quitting: 29.8  . Smokeless tobacco: Never Used  Substance and Sexual Activity  . Alcohol use: No    Alcohol/week: 0.6 oz    Types: 1 Glasses of wine per week  . Drug use: No  . Sexual activity: No  Other Topics Concern  . Not on file  Social History Narrative   Lives at [facility] since 07/27/2014   Previous math Pharmacist, hospital.   Never married   Former smoker stopped 1989   Alcohol occasionally    POA niece Terance Hart [place], [place]    Functional Status Survey:    History reviewed. No pertinent family history.  Health Maintenance  Topic Date Due  . FOOT EXAM  12/06/1942  . OPHTHALMOLOGY EXAM  12/06/1942  . URINE MICROALBUMIN  02/11/2017  . HEMOGLOBIN A1C  06/03/2017  . TETANUS/TDAP  12/16/2021  . INFLUENZA VACCINE  Completed  . DEXA SCAN  Completed  . PNA vac Low Risk Adult  Completed    Allergies  Allergen Reactions  . Aricept [Donepezil Hcl] Diarrhea  . Crestor [Rosuvastatin Calcium]     Myalgia  . Lovastatin     Myalgia  . Micardis [Telmisartan]     Fatigue  . Sertraline Diarrhea  . Welchol [Colesevelam Hcl]     Constipation  . Zetia [Ezetimibe]     Myalgia  . Zocor [Simvastatin]     Myalgia    Outpatient Encounter Medications as of 03/05/2017  Medication Sig  . alendronate (FOSAMAX) 70 MG tablet Take 1 tablet (70 mg total) by mouth every 7 (seven) days. Take with a full glass of water on an empty stomach.  . calcium-vitamin D (OSCAL WITH D) 500-200 MG-UNIT tablet Take 1 tablet by mouth daily with breakfast.  . cetirizine (ZYRTEC) 10 MG tablet Take 10 mg by mouth at bedtime.   . memantine (NAMENDA XR) 28 MG CP24 24 hr capsule Take One tablet by  mouth once daily to help preserve memory  . rivastigmine (EXELON) 9.5 mg/24hr Apply fresh patch daily and remove old patch to help preserve memory  . acetaminophen (TYLENOL) 500 MG tablet Take 500 mg by mouth every 6 (six) hours as needed.  . [DISCONTINUED] calcium-vitamin D (OSCAL WITH D) 500-200 MG-UNIT tablet Take 1 tablet by mouth 2 (two) times daily. (Patient not taking: Reported on 03/05/2017)   No facility-administered encounter medications on file as of 03/05/2017.     Review of Systems  Constitutional: Negative for appetite change, chills, fatigue and fever.  HENT: Negative for congestion, hearing loss, rhinorrhea, sore throat and trouble swallowing.        Has dry mouth  Eyes: Negative for pain and itching.  Respiratory: Negative for cough, choking and shortness of breath.  Cardiovascular: Negative for chest pain, palpitations and leg swelling.  Gastrointestinal: Negative for abdominal pain, blood in stool, constipation, diarrhea, nausea and vomiting.       Denies heartburn  Genitourinary: Negative for dysuria.       Denies any urinary incontinence  Musculoskeletal: Negative for arthralgias, back pain and gait problem.       No fall reported, no assistive device used  Skin: Negative for rash and wound.  Neurological: Negative for dizziness, syncope, numbness and headaches.  Psychiatric/Behavioral: Positive for confusion. Negative for behavioral problems and sleep disturbance.    Vitals:   03/05/17 0859  BP: (!) 152/94  Pulse: 88  Resp: 16  Temp: 98 F (36.7 C)  SpO2: 91%  Height: 5' (1.524 m)   Body mass index is 27.3 kg/m.   BP Readings from Last 3 Encounters:  03/05/17 (!) 152/94  12/09/16 118/62  11/20/16 122/70    Wt Readings from Last 3 Encounters:  12/09/16 139 lb 12.8 oz (63.4 kg)  11/20/16 139 lb 9.6 oz (63.3 kg)  07/23/16 141 lb (64 kg)   Physical Exam  Constitutional: She appears well-developed and well-nourished. No distress.  HENT:  Head:  Normocephalic and atraumatic.  Mouth/Throat: Oropharynx is clear and moist. No oropharyngeal exudate.  Eyes: Conjunctivae are normal. Pupils are equal, round, and reactive to light.  Neck: Normal range of motion. Neck supple.  Cardiovascular: Normal rate and regular rhythm.  Pulmonary/Chest: Effort normal and breath sounds normal. No respiratory distress. She has no wheezes. She has no rales.  Abdominal: Soft. Bowel sounds are normal. There is no tenderness. There is no guarding.  Musculoskeletal: She exhibits edema.  Neurological: She is alert.  Oriented to person and place  Skin: Skin is warm and dry. No rash noted. She is not diaphoretic.  Psychiatric: She has a normal mood and affect. Her behavior is normal.    Labs reviewed: Basic Metabolic Panel: Recent Labs    06/17/16 0000 12/04/16 0000  NA 140 140  K 4.2 4.2  CL 104 103  CO2 26 27  GLUCOSE 106* 92  BUN 8 10  CREATININE 0.62 0.69  CALCIUM 9.3 9.4   Liver Function Tests: Recent Labs    06/17/16 0000 12/04/16 0000  AST 23 21  ALT 18 14  ALKPHOS 59  --   BILITOT 0.9 1.6*  PROT 6.9 6.9  ALBUMIN 4.2  --    No results for input(s): LIPASE, AMYLASE in the last 8760 hours. No results for input(s): AMMONIA in the last 8760 hours. CBC: Recent Labs    12/04/16 0000  WBC 8.1  NEUTROABS 4,544  HGB 13.1  HCT 39.0  MCV 86.1  PLT 287   Cardiac Enzymes: No results for input(s): CKTOTAL, CKMB, CKMBINDEX, TROPONINI in the last 8760 hours. BNP: Invalid input(s): POCBNP Lab Results  Component Value Date   HGBA1C 6.0 (H) 12/04/2016   Lab Results  Component Value Date   TSH 3.87 12/04/2016   No results found for: VITAMINB12 No results found for: FOLATE No results found for: IRON, TIBC, FERRITIN  Lipid Panel: Recent Labs    06/17/16 0000 12/04/16 0000  CHOL 227* 269*  HDL 44* 54  LDLCALC 142*  --   TRIG 207* 138  CHOLHDL 5.2* 5.0*   Lab Results  Component Value Date   HGBA1C 6.0 (H) 12/04/2016     Procedures since last visit: No results found.  Assessment/Plan  1. History of TIA (transient ischemic attack) With her history of  TIA, HTN, hyperlipidemia and prediabetes along with her age, start baby aspirin for cardiovascular protection - aspirin EC 81 MG tablet; Take 1 tablet (81 mg total) by mouth daily.  Dispense: 90 tablet; Refill: 3 - CBC (no diff); Future  2. Prediabetes Monitor a1c next lab.  - CMP with eGFR; Future - Hemoglobin A1c; Future  3. Other hyperlipidemia Allergic to statin, dietary changes advsied - Lipid Panel; Future  4. Age-related osteoporosis without current pathological fracture Tolerating fosamax well. Continue this with ca-vit d supplement.   5. Late onset Alzheimer's disease without behavioral disturbance Continue namenda and exelon patch, supportive care. MMSE reviewed.   6. Essential hypertension Elevated BP today. Prior readings normal on review. No symptoms. Currently not on any antihypertensives. Monitor clinically. Monitor for warning signs.  - CMP with eGFR; Future - CBC (no diff); Future  7. History of rotator cuff tear S/p repiar in 2013. Denies pain - CBC (no diff); Future    Labs/tests ordered:   Lab Orders     CMP with eGFR     Lipid Panel     Hemoglobin A1c     CBC (no diff)   Next appointment: 3 month  Communication: reviewed care plan with patient    Blanchie Serve, MD Internal Medicine [place], [place] [address] Cell Phone (Monday-Friday 8 am - 5 pm): [phone] On Call: [phone] and follow prompts after 5 pm and on weekends Office Phone: [phone] Office Fax: [phone]

## 2017-05-19 ENCOUNTER — Telehealth: Payer: Self-pay | Admitting: *Deleted

## 2017-05-19 NOTE — Telephone Encounter (Signed)
Pt's niece calling asking for recommendation for optometrist for pt for cataract surgery? Pt had one eye already done before moving to [facility] and niece is asking if Dr. Gershon Crane would be a good dr for her? Please call niece back.

## 2017-05-20 ENCOUNTER — Telehealth: Payer: Self-pay | Admitting: *Deleted

## 2017-05-20 NOTE — Telephone Encounter (Signed)
Spoke with the niece and she was happy that Dr. Bubba Camp was on board with [facility] for the cataract surgery.

## 2017-05-20 NOTE — Telephone Encounter (Signed)
Patient's aide from [facility] came by the clinic and asks that the next time you see patient, if you would ask her about urine frequency. She said this has been going on for ~6 months. Patient denies pain, hematuria, burning with urination or foul odor. Aide states she doesn't have any issues with incontinence. Patient's memory is just to the point that she can't remember to ask you about it. Her next appt is 06/02/17 with labs on 05/29/17. She will have an A1c drawn at that time.

## 2017-05-20 NOTE — Telephone Encounter (Signed)
[facility] good [facility]. Getting catarct surgery there would be fine. Please notify the niece.

## 2017-06-02 ENCOUNTER — Other Ambulatory Visit: Payer: Medicare Other

## 2017-06-02 ENCOUNTER — Non-Acute Institutional Stay: Payer: Medicare Other | Admitting: Internal Medicine

## 2017-06-02 ENCOUNTER — Encounter: Payer: Self-pay | Admitting: Internal Medicine

## 2017-06-02 VITALS — BP 134/68 | HR 66 | Temp 97.7°F | Resp 16 | Ht 60.0 in | Wt 138.4 lb

## 2017-06-02 DIAGNOSIS — G301 Alzheimer's disease with late onset: Secondary | ICD-10-CM | POA: Diagnosis not present

## 2017-06-02 DIAGNOSIS — I10 Essential (primary) hypertension: Secondary | ICD-10-CM

## 2017-06-02 DIAGNOSIS — R7303 Prediabetes: Secondary | ICD-10-CM

## 2017-06-02 DIAGNOSIS — F0280 Dementia in other diseases classified elsewhere without behavioral disturbance: Secondary | ICD-10-CM | POA: Diagnosis not present

## 2017-06-02 DIAGNOSIS — H259 Unspecified age-related cataract: Secondary | ICD-10-CM

## 2017-06-02 DIAGNOSIS — J309 Allergic rhinitis, unspecified: Secondary | ICD-10-CM | POA: Insufficient documentation

## 2017-06-02 DIAGNOSIS — Z8673 Personal history of transient ischemic attack (TIA), and cerebral infarction without residual deficits: Secondary | ICD-10-CM

## 2017-06-02 DIAGNOSIS — R1310 Dysphagia, unspecified: Secondary | ICD-10-CM | POA: Diagnosis not present

## 2017-06-02 DIAGNOSIS — E7849 Other hyperlipidemia: Secondary | ICD-10-CM

## 2017-06-02 DIAGNOSIS — M810 Age-related osteoporosis without current pathological fracture: Secondary | ICD-10-CM

## 2017-06-02 NOTE — Progress Notes (Signed)
[facility]  Provider: Blanchie Serve MD   Location:  [place]   Place of Service:  Clinic (12)  PCP: Blanchie Serve, MD Patient Care Team: Blanchie Serve, MD as PCP - General (Internal Medicine) Lindwood Coke, MD as Consulting Physician (Dermatology) Christophe Louis, MD as Consulting Physician (Obstetrics and Gynecology) Latanya Maudlin, MD as Consulting Physician (Orthopedic Surgery) Garlan Fair, MD as Consulting Physician (Gastroenterology) Barnett Abu., MD as Consulting Physician (Cardiology) Neldon Mc, Donnamarie Poag, MD as Consulting Physician (Allergy and Immunology) Rexene Alberts, MD as Consulting Physician (Cardiothoracic Surgery) Melida Quitter, MD as Consulting Physician (Otolaryngology) Nyoka Cowden Viviann Spare, MD as Consulting Physician (Internal Medicine)  Extended Emergency Contact Information Primary Emergency Contact: Kathie [place] of [place] Phone: [phone] Mobile Phone: [phone] Relation: Legal Guardian Secondary Emergency Contact: Leana [place] of [place] Phone: [phone] Relation: Uncle   Chief Complaint  Patient presents with  . Medical Management of Chronic Issues    3 month follow up  . Medication Refill    No refills needed at this time  . MMSE    unbale to obtain    HPI: Patient is a 84 y.o. female seen today for routine visit. She is here by herself. My CMAs and IL nurse have noted increased confusion with her. She is forgetting her lab appointment. This am, when she was called by my CMA to remains her about lab appointment, she did not know where to go and where assisted living area is. She has a caregiver who has brought concern regarding her swallowing for 2 weeks. She has cataract surgery on 06/04/17.   Dementia- currently on memantine 28 mg daily with exelon patch, MMSE 03/05/17 15/30  Allergic rhinitis- currently on cetirizine 10 mg daily,  Osteoporosis-  currently takes calcium and vitamin d supplement with weekly alendronate.  Cataract- pending surgery 06/04/17   Past Medical History:  Diagnosis Date  . Abnormal CT scan, chest September 2011   Scar tissue  . Allergic rhinitis   . Alzheimer's dementia   . Asthma   . Diabetes mellitus    borderline and under control-diet ,exercise  . Esophageal stricture   . Family history of colon cancer    Mother  . GERD (gastroesophageal reflux disease)   . Hiatal hernia   . HLD (hyperlipidemia)   . Hypertension   . Osteopenia   . Pneumonia 12-15 yrs.ago   yrs. ago  . TIA (transient ischemic attack)   . Vitamin D deficiency    Past Surgical History:  Procedure Laterality Date  . ABDOMINAL HYSTERECTOMY  1980   . CATARACT EXTRACTION Left 2014  . SHOULDER OPEN ROTATOR CUFF REPAIR  05/22/2011   Procedure: ROTATOR CUFF REPAIR SHOULDER OPEN;  Surgeon: Tobi Bastos, MD;  Location: [facility];  Service: Orthopedics;  Laterality: Left;  . THORACOTOMY / DECORTICATION PARIETAL PLEURA Right 204   empyema  . TONSILLECTOMY      reports that she quit smoking about 30 years ago. Her smoking use included cigarettes. She has a 30.00 pack-year smoking history. she has never used smokeless tobacco. She reports that she does not drink alcohol or use drugs. Social History   Socioeconomic History  . Marital status: Single    Spouse name: Not on file  . Number of children: Not on file  . Years of education: Not on file  . Highest education level: Not on file  Social Needs  . Financial resource strain:  Not on file  . Food insecurity - worry: Not on file  . Food insecurity - inability: Not on file  . Transportation needs - medical: Not on file  . Transportation needs - non-medical: Not on file  Occupational History  . Occupation: retired Pharmacist, hospital  Tobacco Use  . Smoking status: Former Smoker    Packs/day: 1.50    Years: 20.00    Pack years: 30.00    Types: Cigarettes    Last attempt to quit: 05/15/1987     Years since quitting: 30.0  . Smokeless tobacco: Never Used  Substance and Sexual Activity  . Alcohol use: No    Alcohol/week: 0.6 oz    Types: 1 Glasses of wine per week  . Drug use: No  . Sexual activity: No  Other Topics Concern  . Not on file  Social History Narrative   Lives at [facility] since 07/27/2014   Previous math Pharmacist, hospital.   Never married   Former smoker stopped 1989   Alcohol occasionally    POA niece Terance [place], [place]    Functional Status Survey:    History reviewed. No pertinent family history.  Health Maintenance  Topic Date Due  . FOOT EXAM  12/06/1942  . OPHTHALMOLOGY EXAM  12/06/1942  . URINE MICROALBUMIN  02/11/2017  . HEMOGLOBIN A1C  06/03/2017  . TETANUS/TDAP  12/16/2021  . INFLUENZA VACCINE  Completed  . DEXA SCAN  Completed  . PNA vac Low Risk Adult  Completed    Allergies  Allergen Reactions  . Aricept [Donepezil Hcl] Diarrhea  . Crestor [Rosuvastatin Calcium]     Myalgia  . Lovastatin     Myalgia  . Micardis [Telmisartan]     Fatigue  . Sertraline Diarrhea  . Welchol [Colesevelam Hcl]     Constipation  . Zetia [Ezetimibe]     Myalgia  . Zocor [Simvastatin]     Myalgia    Outpatient Encounter Medications as of 06/02/2017  Medication Sig  . alendronate (FOSAMAX) 70 MG tablet Take 1 tablet (70 mg total) by mouth every 7 (seven) days. Take with a full glass of water on an empty stomach.  . Difluprednate (DUREZOL) 0.05 % EMUL Apply 1 drop to eye 2 (two) times daily. Into the right 3 weeks before surgery  . ofloxacin (OCUFLOX) 0.3 % ophthalmic solution Place 1 drop into the right eye 2 (two) times daily. 2 days before surgery and continue 3 weeks after surgery  . acetaminophen (TYLENOL) 500 MG tablet Take 500 mg by mouth every 6 (six) hours as needed.  Marland Kitchen aspirin EC 81 MG tablet Take 1 tablet (81 mg total) by mouth daily.  . calcium-vitamin D (OSCAL WITH D) 500-200 MG-UNIT tablet Take 1 tablet by mouth daily with  breakfast.  . cetirizine (ZYRTEC) 10 MG tablet Take 10 mg by mouth at bedtime.   . memantine (NAMENDA XR) 28 MG CP24 24 hr capsule Take One tablet by mouth once daily to help preserve memory  . rivastigmine (EXELON) 9.5 mg/24hr Apply fresh patch daily and remove old patch to help preserve memory   No facility-administered encounter medications on file as of 06/02/2017.     Review of Systems  Constitutional: Negative for appetite change, chills, fatigue and fever.  HENT: Negative for congestion, hearing loss, rhinorrhea, sore throat and trouble swallowing.        She has dry mouth but denies trouble swallowing  Eyes: Positive for visual disturbance. Negative for pain and itching.  Respiratory:  Negative for cough, choking and shortness of breath.   Cardiovascular: Negative for chest pain, palpitations and leg swelling.  Gastrointestinal: Negative for abdominal pain, blood in stool, constipation, diarrhea, nausea and vomiting.       Denies heartburn, had a bowel movement this am  Genitourinary: Positive for frequency. Negative for dysuria and hematuria.  Musculoskeletal: Negative for arthralgias, back pain and gait problem.       No fall reported, no assistive device used.  Skin: Negative for rash and wound.  Neurological: Negative for dizziness, syncope, numbness and headaches.  Hematological: Bruises/bleeds easily.  Psychiatric/Behavioral: Positive for confusion. Negative for behavioral problems and sleep disturbance.    Vitals:   06/02/17 1338  BP: 134/68  Pulse: 66  Resp: 16  Temp: 97.7 F (36.5 C)  TempSrc: Oral  SpO2: 99%  Weight: 138 lb 6.4 oz (62.8 kg)  Height: 5' (1.524 m)   Body mass index is 27.03 kg/m.   BP Readings from Last 3 Encounters:  06/02/17 134/68  03/05/17 (!) 152/94  12/09/16 118/62    Wt Readings from Last 3 Encounters:  06/02/17 138 lb 6.4 oz (62.8 kg)  03/05/17 139 lb (63 kg)  12/09/16 139 lb 12.8 oz (63.4 kg)   Physical Exam  Constitutional:  She appears well-developed and well-nourished. No distress.  HENT:  Head: Normocephalic and atraumatic.  Right Ear: External ear normal.  Left Ear: External ear normal.  Nose: Nose normal.  Mouth/Throat: Oropharynx is clear and moist. No oropharyngeal exudate.  Eyes: Conjunctivae and EOM are normal. Pupils are equal, round, and reactive to light. Right eye exhibits no discharge.  Neck: Normal range of motion. Neck supple.  Cardiovascular: Normal rate, regular rhythm and intact distal pulses.  Pulmonary/Chest: Effort normal and breath sounds normal. No respiratory distress. She has no wheezes. She has no rales. She exhibits no tenderness.  Abdominal: Soft. Bowel sounds are normal. She exhibits no mass. There is no tenderness. There is no rebound and no guarding.  Musculoskeletal: Normal range of motion. She exhibits edema.  Able to move all 4 extremities, arthritis changes to her fingers  Lymphadenopathy:    She has no cervical adenopathy.  Neurological: She is alert.  Oriented to place only, 06/02/16 MMSE 11/30, failed clock draw  Skin: Skin is warm and dry. No rash noted. She is not diaphoretic.  Psychiatric: She has a normal mood and affect. Her behavior is normal.    Labs reviewed: Basic Metabolic Panel: Recent Labs    06/17/16 0000 12/04/16 0000 06/02/17 0820  NA 140 140 141  K 4.2 4.2 4.3  CL 104 103 105  CO2 26 27 27   GLUCOSE 106* 92 122*  BUN 8 10 11   CREATININE 0.62 0.69 0.69  CALCIUM 9.3 9.4 9.0   Liver Function Tests: Recent Labs    06/17/16 0000 12/04/16 0000 06/02/17 0820  AST 23 21 20   ALT 18 14 13   ALKPHOS 59  --   --   BILITOT 0.9 1.6* 1.2  PROT 6.9 6.9 7.0  ALBUMIN 4.2  --   --    No results for input(s): LIPASE, AMYLASE in the last 8760 hours. No results for input(s): AMMONIA in the last 8760 hours. CBC: Recent Labs    12/04/16 0000 06/02/17 0820  WBC 8.1 9.0  NEUTROABS 4,544  --   HGB 13.1 13.3  HCT 39.0 38.8  MCV 86.1 87.4  PLT 287 275     Cardiac Enzymes: No results for input(s): CKTOTAL, CKMB, CKMBINDEX,  TROPONINI in the last 8760 hours. BNP: Invalid input(s): POCBNP Lab Results  Component Value Date   HGBA1C 6.0 (H) 12/04/2016   Lab Results  Component Value Date   TSH 3.87 12/04/2016   No results found for: VITAMINB12 No results found for: FOLATE No results found for: IRON, TIBC, FERRITIN  Lipid Panel: Recent Labs    06/17/16 0000 12/04/16 0000 06/02/17 0820  CHOL 227* 269* 286*  HDL 44* 54 54  LDLCALC 142*  --   --   TRIG 207* 138 190*  CHOLHDL 5.2* 5.0* 5.3*   Lab Results  Component Value Date   HGBA1C 6.0 (H) 12/04/2016   MMSE - Mini Mental State Exam 06/02/2017 03/05/2017 07/02/2016  Not completed: Unable to complete - -  Orientation to time 0 1 1  Orientation to Place 5 5 3   Registration 0 3 3  Attention/ Calculation 0 0 0  Recall 0 0 1  Language- name 2 objects 1 2 1   Language- repeat 0 0 0  Language- follow 3 step command 3 3 3   Language- read & follow direction 1 1 0  Write a sentence 1 0 1  Copy design 0 0 0  Total score 11 15 13    Procedures since last visit: No results found.  Assessment/Plan  1. Essential hypertension Controlled, not on any antihypertensive, supportive care.  - CMP; Future - CBC (no diff); Future  2. Late onset Alzheimer's disease without behavioral disturbance Continue namenda and exelon patch for now and monitor - CBC (no diff); Future  3. Age-related osteoporosis without current pathological fracture Continue fosamax with calcium and vitamin d supplement  4. Other hyperlipidemia Dietary counselling, with her dementia and being allergic to statin, not on a statin - CMP; Future - CBC (no diff); Future  5. Prediabetes Diet counselling - CMP; Future - Hemoglobin A1c; Future  6. History of TIA (transient ischemic attack) Continue aspirin for now. Reviewed blood sugar and lab work. Stable blood pressure - CBC (no diff); Future  7. Dysphagia,  unspecified type SLP consult for now with her dysphagia and cognitive impairment - CBC (no diff); Future  8. Allergic rhinitis, unspecified seasonality, unspecified trigger Continue claritin and monitor  9. Senile cataract, unspecified age-related cataract type, unspecified laterality Will get her surgery 06/04/17. Continue with her current eye drops  Labs/tests ordered:    Lab Orders     CMP     CBC (no diff)     Hemoglobin A1c   Next appointment: 3 month  Communication: reviewed care plan with patient. Have tried to contact HCPOA to review goals of care and possible transfer to ALF vs increased nursing/ CNA support in the apartment. Await call back.     Blanchie Serve, MD Internal Medicine [facility] [address] [place], [place] [address] Cell Phone (Monday-Friday 8 am - 5 pm): [phone] On Call: [phone] and follow prompts after 5 pm and on weekends Office Phone: [phone] Office Fax: [phone]

## 2017-06-05 ENCOUNTER — Telehealth: Payer: Self-pay | Admitting: *Deleted

## 2017-06-05 LAB — COMPLETE METABOLIC PANEL WITH GFR
AG Ratio: 1.7 (calc) (ref 1.0–2.5)
ALT: 13 U/L (ref 6–29)
AST: 20 U/L (ref 10–35)
Albumin: 4.4 g/dL (ref 3.6–5.1)
Alkaline phosphatase (APISO): 59 U/L (ref 33–130)
BUN: 11 mg/dL (ref 7–25)
CALCIUM: 9 mg/dL (ref 8.6–10.4)
CO2: 27 mmol/L (ref 20–32)
CREATININE: 0.69 mg/dL (ref 0.60–0.88)
Chloride: 105 mmol/L (ref 98–110)
GFR, Est African American: 94 mL/min/{1.73_m2} (ref >=60)
GFR, Est Non African American: 81 mL/min/{1.73_m2} (ref >=60)
GLOBULIN: 2.6 g/dL (ref 1.9–3.7)
GLUCOSE: 122 mg/dL — AB (ref 65–99)
Potassium: 4.3 mmol/L (ref 3.5–5.3)
Sodium: 141 mmol/L (ref 135–146)
Total Bilirubin: 1.2 mg/dL (ref 0.2–1.2)
Total Protein: 7 g/dL (ref 6.1–8.1)

## 2017-06-05 LAB — LIPID PANEL
CHOL/HDL RATIO: 5.3 (calc) — AB (ref <5.0)
CHOLESTEROL: 286 mg/dL — AB (ref <200)
HDL: 54 mg/dL (ref >50)
LDL Cholesterol (Calc): 195 mg/dL (calc) — ABNORMAL HIGH
NON-HDL CHOLESTEROL (CALC): 232 mg/dL — AB (ref <130)
Triglycerides: 190 mg/dL — ABNORMAL HIGH (ref <150)

## 2017-06-05 LAB — CBC
HCT: 38.8 % (ref 35.0–45.0)
Hemoglobin: 13.3 g/dL (ref 11.7–15.5)
MCH: 30 pg (ref 27.0–33.0)
MCHC: 34.3 g/dL (ref 32.0–36.0)
MCV: 87.4 fL (ref 80.0–100.0)
MPV: 11.2 fL (ref 7.5–12.5)
Platelets: 275 10*3/uL (ref 140–400)
RBC: 4.44 10*6/uL (ref 3.80–5.10)
RDW: 14.7 % (ref 11.0–15.0)
WBC: 9 10*3/uL (ref 3.8–10.8)

## 2017-06-05 LAB — HEMOGLOBIN A1C
HEMOGLOBIN A1C: 5.8 %{Hb} — AB (ref <5.7)
Mean Plasma Glucose: 120 (calc)
eAG (mmol/L): 6.6 (calc)

## 2017-06-05 NOTE — Telephone Encounter (Signed)
Dr. Benjamine Mola [facility] returned your call in reference to patient. She is her HCPOA/legal guardian. You can reach her ar [phone]

## 2017-06-05 NOTE — Telephone Encounter (Signed)
Tried to call the daughter at provided number and have left a message. Will attempt to call again later.

## 2017-06-05 NOTE — Telephone Encounter (Signed)
Received a call back from the [facility]  Patient has had a further decline in her memory. HCPOA has also noticed this. Underwent cataract surgery yesterday. Family is working on arranging for extra days of caregiver to help with medication administration. Family aware about SLP consult for her trouble with swallowing. HCPOA visiting patient next week. She has access to my chart for the patient.

## 2017-07-04 ENCOUNTER — Other Ambulatory Visit: Payer: Self-pay | Admitting: Internal Medicine

## 2017-07-04 DIAGNOSIS — G301 Alzheimer's disease with late onset: Principal | ICD-10-CM

## 2017-07-04 DIAGNOSIS — F0280 Dementia in other diseases classified elsewhere without behavioral disturbance: Secondary | ICD-10-CM

## 2017-08-04 DIAGNOSIS — Z029 Encounter for administrative examinations, unspecified: Secondary | ICD-10-CM

## 2017-08-04 DIAGNOSIS — R7303 Prediabetes: Secondary | ICD-10-CM

## 2017-08-04 DIAGNOSIS — Z8673 Personal history of transient ischemic attack (TIA), and cerebral infarction without residual deficits: Secondary | ICD-10-CM

## 2017-08-04 DIAGNOSIS — I10 Essential (primary) hypertension: Secondary | ICD-10-CM

## 2017-08-04 DIAGNOSIS — G301 Alzheimer's disease with late onset: Secondary | ICD-10-CM

## 2017-08-04 DIAGNOSIS — R1310 Dysphagia, unspecified: Secondary | ICD-10-CM

## 2017-08-04 DIAGNOSIS — F0280 Dementia in other diseases classified elsewhere without behavioral disturbance: Secondary | ICD-10-CM

## 2017-08-04 DIAGNOSIS — E7849 Other hyperlipidemia: Secondary | ICD-10-CM

## 2017-08-05 LAB — COMPREHENSIVE METABOLIC PANEL
AG RATIO: 1.6 (calc) (ref 1.0–2.5)
ALT: 11 U/L (ref 6–29)
AST: 18 U/L (ref 10–35)
Albumin: 4.4 g/dL (ref 3.6–5.1)
Alkaline phosphatase (APISO): 60 U/L (ref 33–130)
BUN: 12 mg/dL (ref 7–25)
CALCIUM: 9.4 mg/dL (ref 8.6–10.4)
CO2: 29 mmol/L (ref 20–32)
Chloride: 102 mmol/L (ref 98–110)
Creat: 0.77 mg/dL (ref 0.60–0.88)
Globulin: 2.7 g/dL (calc) (ref 1.9–3.7)
Glucose, Bld: 101 mg/dL — ABNORMAL HIGH (ref 65–99)
Potassium: 4.3 mmol/L (ref 3.5–5.3)
SODIUM: 139 mmol/L (ref 135–146)
Total Bilirubin: 0.9 mg/dL (ref 0.2–1.2)
Total Protein: 7.1 g/dL (ref 6.1–8.1)

## 2017-08-05 LAB — CBC
HCT: 38.8 % (ref 35.0–45.0)
HEMOGLOBIN: 13.2 g/dL (ref 11.7–15.5)
MCH: 30 pg (ref 27.0–33.0)
MCHC: 34 g/dL (ref 32.0–36.0)
MCV: 88.2 fL (ref 80.0–100.0)
MPV: 11.8 fL (ref 7.5–12.5)
Platelets: 304 10*3/uL (ref 140–400)
RBC: 4.4 10*6/uL (ref 3.80–5.10)
RDW: 14.7 % (ref 11.0–15.0)
WBC: 8.1 10*3/uL (ref 3.8–10.8)

## 2017-08-05 LAB — HEMOGLOBIN A1C
EAG (MMOL/L): 6.8 (calc)
Hgb A1c MFr Bld: 5.9 % of total Hgb — ABNORMAL HIGH (ref <5.7)
MEAN PLASMA GLUCOSE: 123 (calc)

## 2017-08-15 ENCOUNTER — Other Ambulatory Visit: Payer: Self-pay | Admitting: Internal Medicine

## 2017-08-15 DIAGNOSIS — G301 Alzheimer's disease with late onset: Principal | ICD-10-CM

## 2017-08-15 DIAGNOSIS — F0280 Dementia in other diseases classified elsewhere without behavioral disturbance: Secondary | ICD-10-CM

## 2017-09-01 ENCOUNTER — Non-Acute Institutional Stay: Payer: Medicare Other | Admitting: Internal Medicine

## 2017-09-01 ENCOUNTER — Encounter: Payer: Self-pay | Admitting: Internal Medicine

## 2017-09-01 VITALS — BP 126/64 | HR 81 | Temp 97.9°F | Resp 18 | Ht 60.0 in | Wt 141.4 lb

## 2017-09-01 DIAGNOSIS — Z8673 Personal history of transient ischemic attack (TIA), and cerebral infarction without residual deficits: Secondary | ICD-10-CM | POA: Diagnosis not present

## 2017-09-01 DIAGNOSIS — I10 Essential (primary) hypertension: Secondary | ICD-10-CM | POA: Diagnosis not present

## 2017-09-01 DIAGNOSIS — F0280 Dementia in other diseases classified elsewhere without behavioral disturbance: Secondary | ICD-10-CM | POA: Diagnosis not present

## 2017-09-01 DIAGNOSIS — M810 Age-related osteoporosis without current pathological fracture: Secondary | ICD-10-CM | POA: Diagnosis not present

## 2017-09-01 DIAGNOSIS — E782 Mixed hyperlipidemia: Secondary | ICD-10-CM | POA: Diagnosis not present

## 2017-09-01 DIAGNOSIS — R7303 Prediabetes: Secondary | ICD-10-CM

## 2017-09-01 DIAGNOSIS — G301 Alzheimer's disease with late onset: Secondary | ICD-10-CM

## 2017-09-01 DIAGNOSIS — J309 Allergic rhinitis, unspecified: Secondary | ICD-10-CM

## 2017-09-01 MED ORDER — FISH OIL OMEGA-3 1000 MG PO CAPS: 1.0000 | ORAL_CAPSULE | Freq: Every day | ORAL | 3 refills | Status: DC

## 2017-09-01 NOTE — Progress Notes (Signed)
[facility]  Provider: Blanchie Serve MD   Location:  [place]   Place of Service:  Clinic (12)  PCP: Blanchie Serve, MD Patient Care Team: Blanchie Serve, MD as PCP - General (Internal Medicine) Lindwood Coke, MD as Consulting Physician (Dermatology) Christophe Louis, MD as Consulting Physician (Obstetrics and Gynecology) Latanya Maudlin, MD as Consulting Physician (Orthopedic Surgery) Garlan Fair, MD as Consulting Physician (Gastroenterology) Barnett Abu., MD as Consulting Physician (Cardiology) Neldon Mc, Donnamarie Poag, MD as Consulting Physician (Allergy and Immunology) Rexene Alberts, MD as Consulting Physician (Cardiothoracic Surgery) Melida Quitter, MD as Consulting Physician (Otolaryngology) Nyoka Cowden Viviann Spare, MD as Consulting Physician (Internal Medicine)  Extended Emergency Contact Information Primary Emergency Contact: Kathie Rhodes of [place] Phone: [phone] Mobile Phone: [phone] Relation: Legal Guardian Secondary Emergency Contact: Leana Gamer [place] of [place] Phone: [phone] Relation: Uncle   Chief Complaint  Patient presents with  . Medical Management of Chronic Issues    3 month follow up  . Medication Refill    No refills needed at this time.   Marland Kitchen Results    Discuss labs     HPI: Patient is a 84 y.o. female seen today for routine visit.   Dementia- currently on memantine 28 mg daily with exelon patch, MMSE 03/05/17 15/30 and 3/19 11/30. No acute behavioral disturbance reported. Her POA is her niece who lives in [place]. She is not driving anymore.patient has been forgetting her lab and clinic appointment more frequently.   Allergic rhinitis- currently on cetirizine 10 mg daily, symptom free this visit.   Osteoporosis- no fall reported. currently takes calcium and vitamin d supplement with weekly alendronate. Denies joint pain.   S/p cataract surgery, off eye drops.   Past  Medical History:  Diagnosis Date  . Abnormal CT scan, chest September 2011   Scar tissue  . Allergic rhinitis   . Alzheimer's dementia   . Asthma   . Diabetes mellitus    borderline and under control-diet ,exercise  . Esophageal stricture   . Family history of colon cancer    Mother  . GERD (gastroesophageal reflux disease)   . Hiatal hernia   . HLD (hyperlipidemia)   . Hypertension   . Osteopenia   . Pneumonia 12-15 yrs.ago   yrs. ago  . TIA (transient ischemic attack)   . Vitamin D deficiency    Past Surgical History:  Procedure Laterality Date  . ABDOMINAL HYSTERECTOMY  1980   . CATARACT EXTRACTION Left 2014  . SHOULDER OPEN ROTATOR CUFF REPAIR  05/22/2011   Procedure: ROTATOR CUFF REPAIR SHOULDER OPEN;  Surgeon: Tobi Bastos, MD;  Location: [facility];  Service: Orthopedics;  Laterality: Left;  . THORACOTOMY / DECORTICATION PARIETAL PLEURA Right 204   empyema  . TONSILLECTOMY      reports that she quit smoking about 30 years ago. Her smoking use included cigarettes. She has a 30.00 pack-year smoking history. She has never used smokeless tobacco. She reports that she does not drink alcohol or use drugs. Social History   Socioeconomic History  . Marital status: Single    Spouse name: Not on file  . Number of children: Not on file  . Years of education: Not on file  . Highest education level: Not on file  Occupational History  . Occupation: retired Tour manager  . Financial resource strain: Not on file  . Food insecurity:    Worry: Not on file  Inability: Not on file  . Transportation needs:    Medical: Not on file    Non-medical: Not on file  Tobacco Use  . Smoking status: Former Smoker    Packs/day: 1.50    Years: 20.00    Pack years: 30.00    Types: Cigarettes    Last attempt to quit: 05/15/1987    Years since quitting: 30.3  . Smokeless tobacco: Never Used  Substance and Sexual Activity  . Alcohol use: No    Alcohol/week: 0.6 oz    Types: 1  Glasses of wine per week  . Drug use: No  . Sexual activity: Never  Lifestyle  . Physical activity:    Days per week: Not on file    Minutes per session: Not on file  . Stress: Not on file  Relationships  . Social connections:    Talks on phone: Not on file    Gets together: Not on file    Attends religious service: Not on file    Active member of club or organization: Not on file    Attends meetings of clubs or organizations: Not on file    Relationship status: Not on file  . Intimate partner violence:    Fear of current or ex partner: Not on file    Emotionally abused: Not on file    Physically abused: Not on file    Forced sexual activity: Not on file  Other Topics Concern  . Not on file  Social History Narrative   Lives at [facility] since 07/27/2014   Previous math Pharmacist, hospital.   Never married   Former smoker stopped 1989   Alcohol occasionally    POA niece Terance Hart [place], [place]    Functional Status Survey:    History reviewed. No pertinent family history.  Health Maintenance  Topic Date Due  . FOOT EXAM  12/06/1942  . OPHTHALMOLOGY EXAM  12/06/1942  . URINE MICROALBUMIN  02/11/2017  . INFLUENZA VACCINE  10/30/2017  . HEMOGLOBIN A1C  02/04/2018  . TETANUS/TDAP  12/16/2021  . DEXA SCAN  Completed  . PNA vac Low Risk Adult  Completed    Allergies  Allergen Reactions  . Aricept [Donepezil Hcl] Diarrhea  . Crestor [Rosuvastatin Calcium]     Myalgia  . Lovastatin     Myalgia  . Micardis [Telmisartan]     Fatigue  . Sertraline Diarrhea  . Welchol [Colesevelam Hcl]     Constipation  . Zetia [Ezetimibe]     Myalgia  . Zocor [Simvastatin]     Myalgia    Outpatient Encounter Medications as of 09/01/2017  Medication Sig  . acetaminophen (TYLENOL) 500 MG tablet Take 500 mg by mouth every 6 (six) hours as needed.  Marland Kitchen alendronate (FOSAMAX) 70 MG tablet Take 1 tablet (70 mg total) by mouth every 7 (seven) days. Take with a full glass of water on an  empty stomach.  Marland Kitchen aspirin EC 81 MG tablet Take 1 tablet (81 mg total) by mouth daily.  . calcium-vitamin D (OSCAL WITH D) 500-200 MG-UNIT tablet Take 1 tablet by mouth daily with breakfast.  . cetirizine (ZYRTEC) 10 MG tablet Take 10 mg by mouth at bedtime.   . memantine (NAMENDA XR) 28 MG CP24 24 hr capsule TAKE ONE CAPSULE ONCE A DAY TO PRESERVE MEMORY.  . rivastigmine (EXELON) 9.5 mg/24hr Apply fresh patch daily and remove old patch to help preserve memory  . [DISCONTINUED] ofloxacin (OCUFLOX) 0.3 % ophthalmic solution Place 1 drop  into the right eye 2 (two) times daily. 2 days before surgery and continue 3 weeks after surgery  . Omega-3 Fatty Acids (FISH OIL OMEGA-3) 1000 MG CAPS Take 1 capsule by mouth daily.  . [DISCONTINUED] Difluprednate (DUREZOL) 0.05 % EMUL Apply 1 drop to eye 2 (two) times daily. Into the right 3 weeks before surgery   No facility-administered encounter medications on file as of 09/01/2017.     Review of Systems  Constitutional: Negative for appetite change, chills, fatigue and fever.  HENT: Negative for congestion, hearing loss, rhinorrhea, sore throat and trouble swallowing.   Eyes: Positive for visual disturbance. Negative for pain and itching.       S/p cataract surgery, wears corrective glasses  Respiratory: Negative for cough and shortness of breath.   Cardiovascular: Negative for chest pain, palpitations and leg swelling.  Gastrointestinal: Negative for abdominal pain, blood in stool, constipation, diarrhea, nausea and vomiting.  Genitourinary: Positive for frequency. Negative for dysuria and hematuria.  Musculoskeletal: Positive for arthralgias. Negative for back pain and gait problem.       No fall reported, no assistive device used. Tylenol helps with pain. Has not required any recently  Skin: Negative for rash and wound.  Neurological: Negative for dizziness, syncope, numbness and headaches.  Hematological: Bruises/bleeds easily.    Psychiatric/Behavioral: Positive for confusion. Negative for behavioral problems and sleep disturbance.    Vitals:   09/01/17 1547  BP: 126/64  Pulse: 81  Resp: 18  Temp: 97.9 F (36.6 C)  TempSrc: Oral  SpO2: 98%  Weight: 141 lb 6.4 oz (64.1 kg)  Height: 5' (1.524 m)   Body mass index is 27.62 kg/m.   BP Readings from Last 3 Encounters:  09/01/17 126/64  06/02/17 134/68  03/05/17 (!) 152/94    Wt Readings from Last 3 Encounters:  09/01/17 141 lb 6.4 oz (64.1 kg)  06/02/17 138 lb 6.4 oz (62.8 kg)  03/05/17 139 lb (63 kg)   Physical Exam  Constitutional: She appears well-developed and well-nourished. No distress.  HENT:  Head: Normocephalic and atraumatic.  Right Ear: External ear normal.  Left Ear: External ear normal.  Nose: Nose normal.  Mouth/Throat: Oropharynx is clear and moist. No oropharyngeal exudate.  Eyes: Pupils are equal, round, and reactive to light. Conjunctivae and EOM are normal. Right eye exhibits no discharge.  Neck: Normal range of motion. Neck supple.  Cardiovascular: Normal rate, regular rhythm and intact distal pulses.  Pulmonary/Chest: Effort normal and breath sounds normal. No respiratory distress. She has no wheezes. She has no rales. She exhibits no tenderness.  Abdominal: Soft. Bowel sounds are normal. She exhibits no mass. There is no tenderness. There is no rebound and no guarding.  Musculoskeletal: Normal range of motion. She exhibits edema.  Able to move all 4 extremities, arthritis changes to her fingers  Lymphadenopathy:    She has no cervical adenopathy.  Neurological: She is alert.  Oriented to place only, 06/02/17 MMSE 11/30, failed clock draw  Skin: Skin is warm and dry. No rash noted. She is not diaphoretic.  Psychiatric: She has a normal mood and affect. Her behavior is normal.    Labs reviewed: Basic Metabolic Panel: Recent Labs    12/04/16 0000 06/02/17 0820 08/04/17 0745  NA 140 141 139  K 4.2 4.3 4.3  CL 103 105  102  CO2 _0 GLUCOSE 92 122* 101*  BUN _1 CREATININE 0.69 0.69 0.77  CALCIUM 9.4 9.0 9.4  Liver Function Tests: Recent Labs    12/04/16 0000 06/02/17 0820 08/04/17 0745  AST _0 ALT _1 BILITOT 1.6* 1.2 0.9  PROT 6.9 7.0 7.1   No results for input(s): LIPASE, AMYLASE in the last 8760 hours. No results for input(s): AMMONIA in the last 8760 hours. CBC: Recent Labs    12/04/16 0000 06/02/17 0820 08/04/17 0745  WBC 8.1 9.0 8.1  NEUTROABS 4,544  --   --   HGB 13.1 13.3 13.2  HCT 39.0 38.8 38.8  MCV 86.1 87.4 88.2  PLT 287 275 304   Cardiac Enzymes: No results for input(s): CKTOTAL, CKMB, CKMBINDEX, TROPONINI in the last 8760 hours. BNP: Invalid input(s): POCBNP Lab Results  Component Value Date   HGBA1C 5.9 (H) 08/04/2017   Lab Results  Component Value Date   TSH 3.87 12/04/2016   No results found for: VITAMINB12 No results found for: FOLATE No results found for: IRON, TIBC, FERRITIN  Lipid Panel: Recent Labs    12/04/16 0000 06/02/17 0820  CHOL 269* 286*  HDL 54 54  LDLCALC 187* 195*  TRIG 138 190*  CHOLHDL 5.0* 5.3*   Lab Results  Component Value Date   HGBA1C 5.9 (H) 08/04/2017   MMSE - Mini Mental State Exam 06/02/2017 03/05/2017 07/02/2016  Not completed: Unable to complete - -  Orientation to time 0 1 1  Orientation to Place _2 Registration 0 3 3  Attention/ Calculation 0 0 0  Recall 0 0 1  Language- name 2 objects _3 Language- repeat 0 0 0  Language- follow 3 step command _4 Language- read & follow direction 1 1 0  Write a sentence 1 0 1  Copy design 0 0 0  Total score _5 Procedures since last visit: No results found.  Assessment/Plan  1. Mixed hyperlipidemia Allergic to statin, also has dementia.  - Omega-3 Fatty Acids (FISH OIL OMEGA-3) 1000 MG CAPS; Take 1 capsule by mouth daily.  Dispense: 90 capsule; Refill: 3 - CMP with eGFR([facility]); Future - Lipid Panel; Future  2. Essential  hypertension Off medications, controlled BP, monitor, reviewed renal function - CMP with eGFR([facility]); Future - TSH; Future  3. Allergic rhinitis, unspecified seasonality, unspecified trigger Stable, continue cetirizine  4. Late onset Alzheimer's disease without behavioral disturbance C/w namenda and exelon patch, supportive care, will need ALF level of care to help with medication  5. Age-related osteoporosis without current pathological fracture Continue weekly fosamax with calcium and vitamin d supplement  6. Prediabetes Encouraged to ut down on sweets, exercise with brisk walking for atleast 30 minutes 5 days a week.  - CMP with eGFR([facility]); Future - TSH; Future - Hemoglobin A1c; Future  7. History of TIA (transient ischemic attack) Continue aspirin - CMP with eGFR([facility]); Future - Lipid Panel; Future  Labs/tests ordered:    Lab Orders     CMP with eGFR([facility])     Lipid Panel     TSH     Hemoglobin A1c   Next appointment: 4 monthx  Communication: reviewed care plan with patient.have recommended transition to ALF with her declining memory, pt refuses. To attempt to talk with her HCPOA.    Blanchie Serve, MD Internal Medicine [facility] [address] [place], [place] [address] Cell Phone (Monday-Friday 8 am - 5 pm): [phone] On Call: [phone] and follow prompts after 5 pm and on weekends  Office Phone: [phone] Office Fax: [phone]

## 2017-10-07 ENCOUNTER — Other Ambulatory Visit: Payer: Self-pay

## 2017-10-07 DIAGNOSIS — G309 Alzheimer's disease, unspecified: Principal | ICD-10-CM

## 2017-10-07 DIAGNOSIS — F0280 Dementia in other diseases classified elsewhere without behavioral disturbance: Secondary | ICD-10-CM

## 2017-10-07 MED ORDER — RIVASTIGMINE 9.5 MG/24HR TD PT24: MEDICATED_PATCH | TRANSDERMAL | 12 refills | Status: DC

## 2017-10-07 NOTE — Telephone Encounter (Signed)
A medication refill was received from pharmacy for rivastigmine 9.5 mg/24 hour patch. Prescription was sent to the pharmacy electronically.

## 2017-11-12 ENCOUNTER — Encounter: Payer: Self-pay | Admitting: Internal Medicine

## 2017-11-14 ENCOUNTER — Other Ambulatory Visit: Payer: Self-pay | Admitting: Internal Medicine

## 2017-11-14 DIAGNOSIS — M810 Age-related osteoporosis without current pathological fracture: Secondary | ICD-10-CM

## 2017-12-29 ENCOUNTER — Other Ambulatory Visit: Payer: Medicare Other

## 2017-12-29 DIAGNOSIS — E782 Mixed hyperlipidemia: Secondary | ICD-10-CM

## 2017-12-29 DIAGNOSIS — I10 Essential (primary) hypertension: Secondary | ICD-10-CM

## 2017-12-29 DIAGNOSIS — Z8673 Personal history of transient ischemic attack (TIA), and cerebral infarction without residual deficits: Secondary | ICD-10-CM

## 2017-12-29 DIAGNOSIS — R7303 Prediabetes: Secondary | ICD-10-CM

## 2017-12-30 ENCOUNTER — Other Ambulatory Visit: Payer: Self-pay | Admitting: Internal Medicine

## 2017-12-30 DIAGNOSIS — F0280 Dementia in other diseases classified elsewhere without behavioral disturbance: Secondary | ICD-10-CM

## 2017-12-30 DIAGNOSIS — M810 Age-related osteoporosis without current pathological fracture: Secondary | ICD-10-CM

## 2017-12-30 DIAGNOSIS — G301 Alzheimer's disease with late onset: Principal | ICD-10-CM

## 2017-12-30 LAB — COMPLETE METABOLIC PANEL WITH GFR
AG RATIO: 1.6 (calc) (ref 1.0–2.5)
ALBUMIN MSPROF: 4.4 g/dL (ref 3.6–5.1)
ALT: 11 U/L (ref 6–29)
AST: 20 U/L (ref 10–35)
Alkaline phosphatase (APISO): 57 U/L (ref 33–130)
BILIRUBIN TOTAL: 1 mg/dL (ref 0.2–1.2)
BUN: 12 mg/dL (ref 7–25)
CHLORIDE: 102 mmol/L (ref 98–110)
CO2: 26 mmol/L (ref 20–32)
Calcium: 9.4 mg/dL (ref 8.6–10.4)
Creat: 0.73 mg/dL (ref 0.60–0.88)
GFR, EST AFRICAN AMERICAN: 88 mL/min/{1.73_m2} (ref >=60)
GFR, Est Non African American: 76 mL/min/{1.73_m2} (ref >=60)
Globulin: 2.8 g/dL (calc) (ref 1.9–3.7)
Glucose, Bld: 107 mg/dL — ABNORMAL HIGH (ref 65–99)
POTASSIUM: 4.4 mmol/L (ref 3.5–5.3)
Sodium: 140 mmol/L (ref 135–146)
TOTAL PROTEIN: 7.2 g/dL (ref 6.1–8.1)

## 2017-12-30 LAB — LIPID PANEL
Cholesterol: 282 mg/dL — ABNORMAL HIGH (ref <200)
HDL: 49 mg/dL — AB (ref >50)
LDL Cholesterol (Calc): 190 mg/dL (calc) — ABNORMAL HIGH
NON-HDL CHOLESTEROL (CALC): 233 mg/dL — AB (ref <130)
Total CHOL/HDL Ratio: 5.8 (calc) — ABNORMAL HIGH (ref <5.0)
Triglycerides: 233 mg/dL — ABNORMAL HIGH (ref <150)

## 2017-12-30 LAB — TSH: TSH: 4.29 mIU/L (ref 0.40–4.50)

## 2017-12-30 LAB — HEMOGLOBIN A1C
Hgb A1c MFr Bld: 6 % of total Hgb — ABNORMAL HIGH (ref <5.7)
Mean Plasma Glucose: 126 (calc)
eAG (mmol/L): 7 (calc)

## 2018-01-05 ENCOUNTER — Encounter: Payer: Self-pay | Admitting: Internal Medicine

## 2018-01-06 ENCOUNTER — Encounter: Payer: Self-pay | Admitting: Family

## 2018-01-06 ENCOUNTER — Non-Acute Institutional Stay: Payer: Medicare Other | Admitting: Family

## 2018-01-06 VITALS — BP 156/78 | HR 61 | Temp 98.4°F | Resp 18 | Ht 60.0 in | Wt 136.0 lb

## 2018-01-06 DIAGNOSIS — I10 Essential (primary) hypertension: Secondary | ICD-10-CM | POA: Diagnosis not present

## 2018-01-06 DIAGNOSIS — G301 Alzheimer's disease with late onset: Secondary | ICD-10-CM | POA: Diagnosis not present

## 2018-01-06 DIAGNOSIS — F0280 Dementia in other diseases classified elsewhere without behavioral disturbance: Secondary | ICD-10-CM

## 2018-01-06 DIAGNOSIS — M810 Age-related osteoporosis without current pathological fracture: Secondary | ICD-10-CM

## 2018-01-06 DIAGNOSIS — J309 Allergic rhinitis, unspecified: Secondary | ICD-10-CM | POA: Diagnosis not present

## 2018-01-06 DIAGNOSIS — E782 Mixed hyperlipidemia: Secondary | ICD-10-CM

## 2018-01-06 DIAGNOSIS — R7303 Prediabetes: Secondary | ICD-10-CM

## 2018-01-06 MED ORDER — FISH OIL OMEGA-3 1000 MG PO CAPS: 1.0000 | ORAL_CAPSULE | Freq: Every day | ORAL | 3 refills | Status: DC

## 2018-01-06 NOTE — Progress Notes (Signed)
Location:  [place] of Service:  Clinic (12) Provider: Rhylan Gross FNP-C  Blanchie Serve, MD  Patient Care Team: Blanchie Serve, MD as PCP - General (Internal Medicine) Lindwood Coke, MD as Consulting Physician (Dermatology) Christophe Louis, MD as Consulting Physician (Obstetrics and Gynecology) Latanya Maudlin, MD as Consulting Physician (Orthopedic Surgery) Garlan Fair, MD as Consulting Physician (Gastroenterology) Barnett Abu., MD as Consulting Physician (Cardiology) Neldon Mc, Donnamarie Poag, MD as Consulting Physician (Allergy and Immunology) Rexene Alberts, MD as Consulting Physician (Cardiothoracic Surgery) Melida Quitter, MD as Consulting Physician (Otolaryngology) Nyoka Cowden Viviann Spare, MD as Consulting Physician (Internal Medicine)  Extended Emergency Contact Information Primary Emergency Contact: Kathie Rhodes of [place] Phone: [phone] Mobile Phone: [phone] Relation: Legal Guardian Secondary Emergency Contact: Leana Gamer [place] of [place] Phone: [phone] Relation: Uncle   Goals of care: Advanced Directive information Advanced Directives 01/06/2018  Does Patient Have a Medical Advance Directive? Yes  Type of Advance Directive [place]  Does patient want to make changes to medical advance directive? No - Patient declined  Copy of [place] in Chart? Yes  Pre-existing out of facility DNR order (yellow form or pink MOST form) -     Chief Complaint  Patient presents with  . Medical Management of Chronic Issues    Patient here today with her care giver Rudene Re. She has no concerns today.     HPI:  Pt is a 84 y.o. female seen today at [facility] clinic for medical management of chronic issues.she is here escorted by her care giver who provides additional information.she denies any acute issues today.discussed recent lab results with patient: Hgb A1C  6.0 and Lipid panel results chol 282,TRG 233,LDL 190 (12/30/2017) has worsen compared to previous visit.she states has not been taking her Omega -3 fish oil as directed.she has allergies to all statin.Care giver states patient eats ice cream and does not do any exercise. No recent fall episode.she had a flu shot two weeks ago but does not recall the date.will call office with date administered to update records.   Past Medical History:  Diagnosis Date  . Abnormal CT scan, chest September 2011   Scar tissue  . Allergic rhinitis   . Alzheimer's dementia ([place])   . Asthma   . Diabetes mellitus    borderline and under control-diet ,exercise  . Esophageal stricture   . Family history of colon cancer    Mother  . GERD (gastroesophageal reflux disease)   . Hiatal hernia   . HLD (hyperlipidemia)   . Hypertension   . Osteopenia   . Pneumonia 12-15 yrs.ago   yrs. ago  . TIA (transient ischemic attack)   . Vitamin D deficiency    Past Surgical History:  Procedure Laterality Date  . ABDOMINAL HYSTERECTOMY  1980   . CATARACT EXTRACTION Left 2014  . SHOULDER OPEN ROTATOR CUFF REPAIR  05/22/2011   Procedure: ROTATOR CUFF REPAIR SHOULDER OPEN;  Surgeon: Tobi Bastos, MD;  Location: [facility];  Service: Orthopedics;  Laterality: Left;  . THORACOTOMY / DECORTICATION PARIETAL PLEURA Right 204   empyema  . TONSILLECTOMY      Allergies  Allergen Reactions  . Aricept [Donepezil Hcl] Diarrhea  . Crestor [Rosuvastatin Calcium]     Myalgia  . Lovastatin     Myalgia  . Micardis [Telmisartan]     Fatigue  . Sertraline Diarrhea  . Welchol [Colesevelam Hcl]  Constipation  . Zetia [Ezetimibe]     Myalgia  . Zocor [Simvastatin]     Myalgia    Outpatient Encounter Medications as of 01/06/2018  Medication Sig  . acetaminophen (TYLENOL) 500 MG tablet Take 500 mg by mouth every 6 (six) hours as needed.  Marland Kitchen alendronate (FOSAMAX) 70 MG tablet TAKE 1 TAB ONCE A WEEK, AT LEAST 30 MIN BEFORE 1ST  FOOD.DO NOT LIE DOWN FOR 30 MIN AFTER TAKING.  Marland Kitchen aspirin EC 81 MG tablet Take 1 tablet (81 mg total) by mouth daily.  . calcium-vitamin D (OSCAL WITH D) 500-200 MG-UNIT tablet Take 1 tablet by mouth daily with breakfast.  . cetirizine (ZYRTEC) 10 MG tablet Take 10 mg by mouth at bedtime.   . memantine (NAMENDA XR) 28 MG CP24 24 hr capsule TAKE ONE CAPSULE ONCE A DAY TO PRESERVE MEMORY.  . rivastigmine (EXELON) 9.5 mg/24hr Apply fresh patch daily and remove old patch to help preserve memory  . Omega-3 Fatty Acids (FISH OIL OMEGA-3) 1000 MG CAPS Take 1 capsule by mouth daily.  . [DISCONTINUED] Omega-3 Fatty Acids (FISH OIL OMEGA-3) 1000 MG CAPS Take 1 capsule by mouth daily. (Patient not taking: Reported on 01/06/2018)   No facility-administered encounter medications on file as of 01/06/2018.     Review of Systems  Constitutional: Negative for appetite change, chills, fatigue, fever and unexpected weight change.  HENT: Positive for postnasal drip and rhinorrhea. Negative for congestion, sinus pressure, sinus pain, sneezing, sore throat and trouble swallowing.        Chronic allergies   Eyes: Positive for visual disturbance. Negative for discharge, redness and itching.       States seen by ophthalmology 2 months ago but doesn't recall the date.  Respiratory: Negative for cough, chest tightness, shortness of breath and wheezing.   Cardiovascular: Negative for chest pain, palpitations and leg swelling.  Gastrointestinal: Negative for abdominal distention, abdominal pain, constipation, diarrhea, nausea and vomiting.  Endocrine: Negative for cold intolerance, heat intolerance, polydipsia, polyphagia and polyuria.  Genitourinary: Negative for dysuria, flank pain and urgency.       Frequency at times   Musculoskeletal: Negative for arthralgias and gait problem.  Skin: Negative for color change, pallor, rash and wound.  Neurological: Negative for dizziness, weakness, light-headedness and headaches.    Hematological: Does not bruise/bleed easily.  Psychiatric/Behavioral: Negative for agitation and sleep disturbance. The patient is not nervous/anxious.        Forgetful    Immunization History  Administered Date(s) Administered  . Influenza-Unspecified 12/23/2013, 12/01/2014, 01/11/2016  . Pneumococcal Conjugate-13 11/22/2013  . Pneumococcal Polysaccharide-23 07/01/2006  . Tdap 12/17/2011  . Zoster 12/25/2005   Pertinent  Health Maintenance Due  Topic Date Due  . FOOT EXAM  12/06/1942  . OPHTHALMOLOGY EXAM  12/06/1942  . URINE MICROALBUMIN  02/11/2017  . INFLUENZA VACCINE  10/30/2017  . HEMOGLOBIN A1C  06/29/2018  . DEXA SCAN  Completed  . PNA vac Low Risk Adult  Completed   Fall Risk  01/22/2017 07/23/2016 04/25/2015 03/13/2015 03/07/2015  Falls in the past year? No No No No No   Functional Status Survey:    Vitals:   01/06/18 1323  BP: (!) 156/78  Pulse: 61  Resp: 18  Temp: 98.4 F (36.9 C)  SpO2: 98%  Weight: 136 lb (61.7 kg)  Height: 5' (1.524 m)   Body mass index is 26.56 kg/m. Physical Exam  Constitutional: She is oriented to person, place, and time. She appears well-developed and well-nourished. No  distress.  HENT:  Head: Normocephalic.  Right Ear: External ear normal.  Left Ear: External ear normal.  Mouth/Throat: Oropharynx is clear and moist. No oropharyngeal exudate.  Eyes: Pupils are equal, round, and reactive to light. Conjunctivae and EOM are normal. Right eye exhibits no discharge. Left eye exhibits no discharge. No scleral icterus.  Neck: Normal range of motion. No JVD present. No thyromegaly present.  Cardiovascular: Normal rate, regular rhythm, normal heart sounds and intact distal pulses. Exam reveals no gallop and no friction rub.  No murmur heard. Pulmonary/Chest: Effort normal and breath sounds normal. No respiratory distress. She has no wheezes. She has no rales. She exhibits no tenderness.  Abdominal: Soft. Bowel sounds are normal. She  exhibits no distension and no mass. There is no tenderness. There is no rebound and no guarding.  Musculoskeletal: Normal range of motion. She exhibits no edema, tenderness or deformity.  Lymphadenopathy:    She has no cervical adenopathy.  Neurological: She is oriented to person, place, and time.  Forgetful.  Skin: Skin is warm and dry. Capillary refill takes 2 to 3 seconds. No rash noted. No erythema. No pallor.  Psychiatric: She has a normal mood and affect. Her speech is normal and behavior is normal. Judgment and thought content normal. She exhibits abnormal recent memory.  MMSE: 03/05/2017 : 15/30 05/2017 : 11/30  Vitals reviewed.   Labs reviewed: Recent Labs    06/02/17 0820 08/04/17 0745 12/29/17 0745  NA 141 139 140  K 4.3 4.3 4.4  CL 105 102 102  CO2 27 29 26   GLUCOSE 122* 101* 107*  BUN 11 12 12   CREATININE 0.69 0.77 0.73  CALCIUM 9.0 9.4 9.4   Recent Labs    06/02/17 0820 08/04/17 0745 12/29/17 0745  AST 20 18 20   ALT 13 11 11   BILITOT 1.2 0.9 1.0  PROT 7.0 7.1 7.2   Recent Labs    06/02/17 0820 08/04/17 0745  WBC 9.0 8.1  HGB 13.3 13.2  HCT 38.8 38.8  MCV 87.4 88.2  PLT 275 304   Lab Results  Component Value Date   TSH 4.29 12/29/2017   Lab Results  Component Value Date   HGBA1C 6.0 (H) 12/29/2017   Lab Results  Component Value Date   CHOL 282 (H) 12/29/2017   HDL 49 (L) 12/29/2017   LDLCALC 190 (H) 12/29/2017   TRIG 233 (H) 12/29/2017   CHOLHDL 5.8 (H) 12/29/2017    Significant Diagnostic Results in last 30 days:  No results found.  Assessment/Plan 1. Mixed hyperlipidemia Recent lab work has worsen compared to previous visit.Stopped taking fish oil but would like to restart.Encouraged low carbohydrates,low saturated fats,low concentrated sweets and high vegetable diet.DASH diet information provided this visit. - Fasting lipid panel in 3 months - Omega-3 Fatty Acids (FISH OIL OMEGA-3) 1000 MG CAPS; Take 1 capsule by mouth daily.   Dispense: 90 capsule; Refill: 3  2. Essential hypertension B/p stable.off medication.continue on ASA EC 81 mg tablet daily for chest pain prophylaxis.CBC/diff,CMP,TSH level in 3 months.   3. Allergic rhinitis, unspecified seasonality, unspecified trigger Chronic.continue on cetrizine 10 mg tablet daily.  4. Late onset Alzheimer's disease without behavioral disturbance ([place]) No new behavioral issues.continue on rivastigmine 9.5mg  patch and memantine 28 mg capsule.care giver to continue with supportive care.    5. Age-related osteoporosis without current pathological fracture No recent fall or fractures.continue on Oscal tablet daily and Fosamax 70 mg tablet.  Family/ staff Communication: Reviewed plan of care with  patient and care giver.  Labs/tests ordered: CBC/diff,CMP,TSH level,Hgb A1C and fasting lipid panel.   Sandrea Hughs, NP

## 2018-01-06 NOTE — Patient Instructions (Signed)
1. Follow up in 3 months blood work one week prior to visit 2. Increase physical activity and avoid high concentrated sweets may try sugar free snacks.  DASH Eating Plan DASH stands for "Dietary Approaches to Stop Hypertension." The DASH eating plan is a healthy eating plan that has been shown to reduce high blood pressure (hypertension). It may also reduce your risk for type 2 diabetes, heart disease, and stroke. The DASH eating plan may also help with weight loss. What are tips for following this plan? General guidelines  Avoid eating more than 2,300 mg (milligrams) of salt (sodium) a day. If you have hypertension, you may need to reduce your sodium intake to 1,500 mg a day.  Limit alcohol intake to no more than 1 drink a day for nonpregnant women and 2 drinks a day for men. One drink equals 12 oz of beer, 5 oz of wine, or 1 oz of hard liquor.  Work with your health care provider to maintain a healthy body weight or to lose weight. Ask what an ideal weight is for you.  Get at least 30 minutes of exercise that causes your heart to beat faster (aerobic exercise) most days of the week. Activities may include walking, swimming, or biking.  Work with your health care provider or diet and nutrition specialist (dietitian) to adjust your eating plan to your individual calorie needs. Reading food labels  Check food labels for the amount of sodium per serving. Choose foods with less than 5 percent of the Daily Value of sodium. Generally, foods with less than 300 mg of sodium per serving fit into this eating plan.  To find whole grains, look for the word "whole" as the first word in the ingredient list. Shopping  Buy products labeled as "low-sodium" or "no salt added."  Buy fresh foods. Avoid canned foods and premade or frozen meals. Cooking  Avoid adding salt when cooking. Use salt-free seasonings or herbs instead of table salt or sea salt. Check with your health care provider or pharmacist  before using salt substitutes.  Do not fry foods. Cook foods using healthy methods such as baking, boiling, grilling, and broiling instead.  Cook with heart-healthy oils, such as olive, canola, soybean, or sunflower oil. Meal planning   Eat a balanced diet that includes: ? 5 or more servings of fruits and vegetables each day. At each meal, try to fill half of your plate with fruits and vegetables. ? Up to 6-8 servings of whole grains each day. ? Less than 6 oz of lean meat, poultry, or fish each day. A 3-oz serving of meat is about the same size as a deck of cards. One egg equals 1 oz. ? 2 servings of low-fat dairy each day. ? A serving of nuts, seeds, or beans 5 times each week. ? Heart-healthy fats. Healthy fats called Omega-3 fatty acids are found in foods such as flaxseeds and coldwater fish, like sardines, salmon, and mackerel.  Limit how much you eat of the following: ? Canned or prepackaged foods. ? Food that is high in trans fat, such as fried foods. ? Food that is high in saturated fat, such as fatty meat. ? Sweets, desserts, sugary drinks, and other foods with added sugar. ? Full-fat dairy products.  Do not salt foods before eating.  Try to eat at least 2 vegetarian meals each week.  Eat more home-cooked food and less restaurant, buffet, and fast food.  When eating at a restaurant, ask that your food be  prepared with less salt or no salt, if possible. What foods are recommended? The items listed may not be a complete list. Talk with your dietitian about what dietary choices are best for you. Grains Whole-grain or whole-wheat bread. Whole-grain or whole-wheat pasta. Brown rice. Grace Gilbert. Bulgur. Whole-grain and low-sodium cereals. Pita bread. Low-fat, low-sodium crackers. Whole-wheat flour tortillas. Vegetables Fresh or frozen vegetables (raw, steamed, roasted, or grilled). Low-sodium or reduced-sodium tomato and vegetable juice. Low-sodium or reduced-sodium tomato  sauce and tomato paste. Low-sodium or reduced-sodium canned vegetables. Fruits All fresh, dried, or frozen fruit. Canned fruit in natural juice (without added sugar). Meat and other protein foods Skinless chicken or [place]. Ground chicken or [place]. Pork with fat trimmed off. Fish and seafood. Egg whites. Dried beans, peas, or lentils. Unsalted nuts, nut butters, and seeds. Unsalted canned beans. Lean cuts of beef with fat trimmed off. Low-sodium, lean deli meat. Dairy Low-fat (1%) or fat-free (skim) milk. Fat-free, low-fat, or reduced-fat cheeses. Nonfat, low-sodium ricotta or cottage cheese. Low-fat or nonfat yogurt. Low-fat, low-sodium cheese. Fats and oils Soft margarine without trans fats. Vegetable oil. Low-fat, reduced-fat, or light mayonnaise and salad dressings (reduced-sodium). Canola, safflower, olive, soybean, and sunflower oils. Avocado. Seasoning and other foods Herbs. Spices. Seasoning mixes without salt. Unsalted popcorn and pretzels. Fat-free sweets. What foods are not recommended? The items listed may not be a complete list. Talk with your dietitian about what dietary choices are best for you. Grains Baked goods made with fat, such as croissants, muffins, or some breads. Dry pasta or rice meal packs. Vegetables Creamed or fried vegetables. Vegetables in a cheese sauce. Regular canned vegetables (not low-sodium or reduced-sodium). Regular canned tomato sauce and paste (not low-sodium or reduced-sodium). Regular tomato and vegetable juice (not low-sodium or reduced-sodium). Grace Gilbert. Olives. Fruits Canned fruit in a light or heavy syrup. Fried fruit. Fruit in cream or butter sauce. Meat and other protein foods Fatty cuts of meat. Ribs. Fried meat. Grace Gilbert. Sausage. Bologna and other processed lunch meats. Salami. Fatback. Hotdogs. Bratwurst. Salted nuts and seeds. Canned beans with added salt. Canned or smoked fish. Whole eggs or egg yolks. Chicken or [place] with skin. Dairy Whole  or 2% milk, cream, and half-and-half. Whole or full-fat cream cheese. Whole-fat or sweetened yogurt. Full-fat cheese. Nondairy creamers. Whipped toppings. Processed cheese and cheese spreads. Fats and oils Butter. Stick margarine. Lard. Shortening. Ghee. Bacon fat. Tropical oils, such as coconut, palm kernel, or palm oil. Seasoning and other foods Salted popcorn and pretzels. Onion salt, garlic salt, seasoned salt, table salt, and sea salt. Worcestershire sauce. Tartar sauce. Barbecue sauce. Teriyaki sauce. Soy sauce, including reduced-sodium. Steak sauce. Canned and packaged gravies. Fish sauce. Oyster sauce. Cocktail sauce. Horseradish that you find on the shelf. Ketchup. Mustard. Meat flavorings and tenderizers. Bouillon cubes. Hot sauce and Tabasco sauce. Premade or packaged marinades. Premade or packaged taco seasonings. Relishes. Regular salad dressings. Where to find more information:  National Heart, Lung, and [place]: https://wilson-eaton.com/  American Heart Association: www.heart.org Summary  The DASH eating plan is a healthy eating plan that has been shown to reduce high blood pressure (hypertension). It may also reduce your risk for type 2 diabetes, heart disease, and stroke.  With the DASH eating plan, you should limit salt (sodium) intake to 2,300 mg a day. If you have hypertension, you may need to reduce your sodium intake to 1,500 mg a day.  When on the DASH eating plan, aim to eat more fresh fruits and vegetables, whole grains,  lean proteins, low-fat dairy, and heart-healthy fats.  Work with your health care provider or diet and nutrition specialist (dietitian) to adjust your eating plan to your individual calorie needs. This information is not intended to replace advice given to you by your health care provider. Make sure you discuss any questions you have with your health care provider. Document Released: 03/07/2011 Document Revised: 03/11/2016 Document Reviewed:  03/11/2016 Elsevier Interactive Patient Education  [employer].

## 2018-02-03 ENCOUNTER — Other Ambulatory Visit: Payer: Self-pay | Admitting: Family

## 2018-02-03 DIAGNOSIS — G301 Alzheimer's disease with late onset: Principal | ICD-10-CM

## 2018-02-03 DIAGNOSIS — M810 Age-related osteoporosis without current pathological fracture: Secondary | ICD-10-CM

## 2018-02-03 DIAGNOSIS — F0280 Dementia in other diseases classified elsewhere without behavioral disturbance: Secondary | ICD-10-CM

## 2018-02-04 ENCOUNTER — Telehealth: Payer: Self-pay | Admitting: Family

## 2018-02-04 NOTE — Telephone Encounter (Signed)
I called the pt to schedule AWV-S with Clarise Cruz at [facility] on either 11/13 or 11/22.  She asked that I call back tomorrow or Friday when her caregiver is here so I can schedule date/time with her. VDM (DD)

## 2018-04-02 ENCOUNTER — Other Ambulatory Visit: Payer: Medicare Other

## 2018-04-06 ENCOUNTER — Other Ambulatory Visit: Payer: Medicare Other

## 2018-04-07 ENCOUNTER — Other Ambulatory Visit: Payer: Self-pay

## 2018-04-07 LAB — CBC WITH DIFFERENTIAL/PLATELET
ABSOLUTE MONOCYTES: 890 {cells}/uL (ref 200–950)
BASOS PCT: 1.4 %
Basophils Absolute: 125 cells/uL (ref 0–200)
Eosinophils Absolute: 329 cells/uL (ref 15–500)
Eosinophils Relative: 3.7 %
HCT: 39.7 % (ref 35.0–45.0)
Hemoglobin: 13.3 g/dL (ref 11.7–15.5)
Lymphs Abs: 2946 cells/uL (ref 850–3900)
MCH: 29.3 pg (ref 27.0–33.0)
MCHC: 33.5 g/dL (ref 32.0–36.0)
MCV: 87.4 fL (ref 80.0–100.0)
MONOS PCT: 10 %
MPV: 12.7 fL — AB (ref 7.5–12.5)
NEUTROS PCT: 51.8 %
Neutro Abs: 4610 cells/uL (ref 1500–7800)
PLATELETS: 324 10*3/uL (ref 140–400)
RBC: 4.54 10*6/uL (ref 3.80–5.10)
RDW: 15.4 % — AB (ref 11.0–15.0)
TOTAL LYMPHOCYTE: 33.1 %
WBC: 8.9 10*3/uL (ref 3.8–10.8)

## 2018-04-07 LAB — COMPLETE METABOLIC PANEL WITH GFR
AG RATIO: 1.6 (calc) (ref 1.0–2.5)
ALBUMIN MSPROF: 4.5 g/dL (ref 3.6–5.1)
ALKALINE PHOSPHATASE (APISO): 65 U/L (ref 33–130)
ALT: 11 U/L (ref 6–29)
AST: 17 U/L (ref 10–35)
BILIRUBIN TOTAL: 1.3 mg/dL — AB (ref 0.2–1.2)
BUN: 12 mg/dL (ref 7–25)
CHLORIDE: 103 mmol/L (ref 98–110)
CO2: 30 mmol/L (ref 20–32)
CREATININE: 0.73 mg/dL (ref 0.60–0.88)
Calcium: 9.9 mg/dL (ref 8.6–10.4)
GFR, Est African American: 88 mL/min/{1.73_m2} (ref >=60)
GFR, Est Non African American: 76 mL/min/{1.73_m2} (ref >=60)
GLOBULIN: 2.8 g/dL (ref 1.9–3.7)
Glucose, Bld: 104 mg/dL — ABNORMAL HIGH (ref 65–99)
POTASSIUM: 4.5 mmol/L (ref 3.5–5.3)
SODIUM: 141 mmol/L (ref 135–146)
Total Protein: 7.3 g/dL (ref 6.1–8.1)

## 2018-04-07 LAB — LIPID PANEL
CHOL/HDL RATIO: 5.3 (calc) — AB (ref <5.0)
CHOLESTEROL: 275 mg/dL — AB (ref <200)
HDL: 52 mg/dL (ref >50)
LDL CHOLESTEROL (CALC): 190 mg/dL — AB
Non-HDL Cholesterol (Calc): 223 mg/dL (calc) — ABNORMAL HIGH (ref <130)
TRIGLYCERIDES: 161 mg/dL — AB (ref <150)

## 2018-04-07 LAB — HEMOGLOBIN A1C
HEMOGLOBIN A1C: 5.8 %{Hb} — AB (ref <5.7)
MEAN PLASMA GLUCOSE: 120 (calc)
eAG (mmol/L): 6.6 (calc)

## 2018-04-08 ENCOUNTER — Encounter: Payer: Medicare Other | Admitting: Internal Medicine

## 2018-04-10 ENCOUNTER — Telehealth: Payer: Self-pay

## 2018-04-10 NOTE — Telephone Encounter (Signed)
Patient and caregiver called to request lab results from 04/06/2018 and to see when next appointment is. I informed caregiver that patient had appointment with Dr.Gupta on Wednesday 04/08/2018 and it was marked as a no show. Patient and caregiver was not aware of this appointment. Patient requested to reschedule appointment. I rescheduled for next available on 04/22/2018.  I informed caregiver that I will send message to provider to address labs and the medical assistant working with the provider will call with results.

## 2018-04-14 ENCOUNTER — Encounter: Payer: Self-pay | Admitting: Family

## 2018-04-21 NOTE — Telephone Encounter (Signed)
Grace Gilbert please advise if you discussed results with patient. I seen documentation where a message was left for patient on 04/13/2018, yet we are attempt to contact patient x 3.

## 2018-04-21 NOTE — Telephone Encounter (Signed)
Patient will discuss labs with Dr. Lyndel Safe at her next appointment on 04/22/2018.

## 2018-04-22 ENCOUNTER — Encounter: Payer: Self-pay | Admitting: Internal Medicine

## 2018-04-22 ENCOUNTER — Non-Acute Institutional Stay: Payer: Medicare Other | Admitting: Internal Medicine

## 2018-04-22 VITALS — BP 160/80 | HR 71 | Temp 97.9°F | Ht 60.0 in | Wt 136.2 lb

## 2018-04-22 DIAGNOSIS — M810 Age-related osteoporosis without current pathological fracture: Secondary | ICD-10-CM

## 2018-04-22 DIAGNOSIS — E7849 Other hyperlipidemia: Secondary | ICD-10-CM | POA: Diagnosis not present

## 2018-04-22 DIAGNOSIS — R7303 Prediabetes: Secondary | ICD-10-CM

## 2018-04-22 DIAGNOSIS — I10 Essential (primary) hypertension: Secondary | ICD-10-CM

## 2018-04-22 DIAGNOSIS — Z8673 Personal history of transient ischemic attack (TIA), and cerebral infarction without residual deficits: Secondary | ICD-10-CM

## 2018-04-22 DIAGNOSIS — G301 Alzheimer's disease with late onset: Secondary | ICD-10-CM

## 2018-04-22 DIAGNOSIS — F0280 Dementia in other diseases classified elsewhere without behavioral disturbance: Secondary | ICD-10-CM

## 2018-04-22 NOTE — Progress Notes (Signed)
Location:  [place] of Service:  Clinic (12)  Provider:   Code Status:  Goals of Care:  Advanced Directives 04/22/2018  Does Patient Have a Medical Advance Directive? Yes  Type of Advance Directive [place]  Does patient want to make changes to medical advance directive? No - Patient declined  Copy of [place] in Chart? Yes - validated most recent copy scanned in chart (See row information)  Pre-existing out of facility DNR order (yellow form or pink MOST form) -     Chief Complaint  Patient presents with  . Medical Management of Chronic Issues    3 month follow up,discuss labs,PHQ SCORE 0, Due for eye exam     HPI: Patient is a 85 y.o. female seen today for medical management of chronic diseases.   Patient has h/o Hyperlipidemia, Osteoporosis, Allergic Rhinitis and Dementia Alzheimer.  Patient is here with her Caregiver. She is with her for 3 hours 3/Week. Caregiver helps  With her  driving and Medicines. Patients Niece is taking care of her Finances. Patient is independent in her ADLS. She does not cook and Mostly eats out. She knows she has dementia. 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 Per Caregiver she does not socialize much. She stays mostly in her apartment. But her appetite is good and She is sleeping good. She walks with no assist and has no Falls recently. Past Medical History:  Diagnosis Date  . Abnormal CT scan, chest September 2011   Scar tissue  . Allergic rhinitis   . Alzheimer's dementia ([place])   . Asthma   . Diabetes mellitus    borderline and under control-diet ,exercise  . Esophageal stricture   . Family history of colon cancer    Mother  . GERD (gastroesophageal reflux disease)   . Hiatal hernia   . HLD (hyperlipidemia)   . Hypertension   . Osteopenia   . Pneumonia 12-15 yrs.ago   yrs. ago  . TIA (transient ischemic attack)   . Vitamin D deficiency      Past Surgical History:  Procedure Laterality Date  . ABDOMINAL HYSTERECTOMY  1980   . CATARACT EXTRACTION Left 2014  . SHOULDER OPEN ROTATOR CUFF REPAIR  05/22/2011   Procedure: ROTATOR CUFF REPAIR SHOULDER OPEN;  Surgeon: Tobi Bastos, MD;  Location: [facility];  Service: Orthopedics;  Laterality: Left;  . THORACOTOMY / DECORTICATION PARIETAL PLEURA Right 204   empyema  . TONSILLECTOMY      Allergies  Allergen Reactions  . Aricept [Donepezil Hcl] Diarrhea  . Crestor [Rosuvastatin Calcium]     Myalgia  . Lovastatin     Myalgia  . Micardis [Telmisartan]     Fatigue  . Sertraline Diarrhea  . Welchol [Colesevelam Hcl]     Constipation  . Zetia [Ezetimibe]     Myalgia  . Zocor [Simvastatin]     Myalgia    Outpatient Encounter Medications as of 04/22/2018  Medication Sig  . alendronate (FOSAMAX) 70 MG tablet TAKE 1 TAB ONCE A WEEK, AT LEAST 30 MIN BEFORE 1ST FOOD.DO NOT LIE DOWN FOR 30 MIN AFTER TAKING.  Marland Kitchen aspirin EC 81 MG tablet Take 1 tablet (81 mg total) by mouth daily.  . calcium-vitamin D (OSCAL WITH D) 500-200 MG-UNIT tablet Take 1 tablet by mouth daily with breakfast.  . cetirizine (ZYRTEC) 10 MG tablet Take 10 mg by mouth at bedtime.   Marland Kitchen  memantine (NAMENDA XR) 28 MG CP24 24 hr capsule TAKE ONE CAPSULE ONCE A DAY TO PRESERVE MEMORY.  . rivastigmine (EXELON) 9.5 mg/24hr Apply fresh patch daily and remove old patch to help preserve memory  . [DISCONTINUED] acetaminophen (TYLENOL) 500 MG tablet Take 500 mg by mouth every 6 (six) hours as needed.  . Omega-3 Fatty Acids (FISH OIL OMEGA-3) 1000 MG CAPS Take 1 capsule by mouth daily. (Patient not taking: Reported on 04/22/2018)   No facility-administered encounter medications on file as of 04/22/2018.     Review of Systems:  Review of Systems  Review of Systems  Constitutional: Negative for activity change, appetite change, chills, diaphoresis, fatigue and fever.  HENT: Negative for mouth sores, postnasal drip,  rhinorrhea, sinus pain and sore throat.   Respiratory: Negative for apnea, cough, chest tightness, shortness of breath and wheezing.   Cardiovascular: Negative for chest pain, palpitations and leg swelling.  Gastrointestinal: Negative for abdominal distention, abdominal pain, constipation, diarrhea, nausea and vomiting.  Genitourinary: Negative for dysuria and frequency.  Musculoskeletal: Negative for arthralgias, joint swelling and myalgias.  Skin: Negative for rash.  Neurological: Negative for dizziness, syncope, weakness, light-headedness and numbness.  Psychiatric/Behavioral: Negative for behavioral problems, confusion and sleep disturbance.     Health Maintenance  Topic Date Due  . FOOT EXAM  12/06/1942  . OPHTHALMOLOGY EXAM  12/06/1942  . URINE MICROALBUMIN  02/11/2017  . HEMOGLOBIN A1C  10/05/2018  . TETANUS/TDAP  12/16/2021  . INFLUENZA VACCINE  Completed  . DEXA SCAN  Completed  . PNA vac Low Risk Adult  Completed    Physical Exam: Vitals:   04/22/18 1525  BP: (!) 160/80  Pulse: 71  Temp: 97.9 F (36.6 C)  TempSrc: Oral  SpO2: 97%  Weight: 136 lb 3.2 oz (61.8 kg)  Height: 5' (1.524 m)   Body mass index is 26.6 kg/m. Physical Exam Constitutional: Well-developed and well-nourished.  HENT:  Head: Normocephalic.  Mouth/Throat: Oropharynx is clear and moist.  Eyes: Pupils are equal, round, and reactive to light.  Neck: Neck supple.  Cardiovascular: Normal rate and normal heart sounds.  No murmur heard. Pulmonary/Chest: Effort normal and breath sounds normal. No respiratory distress. No wheezes. She has no rales.  Abdominal: Soft. Bowel sounds are normal. No distension. There is no tenderness. There is no rebound.  Musculoskeletal: No edema.  Lymphadenopathy: none Neurological: No Focal Deficits. But patient does get confused and some problems with Word finding. But otherwise appropriate in her answers. Skin: Skin is warm and dry.  Psychiatric: Normal mood and  affect. Behavior is normal. Thought content normal.  . Labs reviewed: Basic Metabolic Panel: Recent Labs    08/04/17 0745 12/29/17 0745 04/06/18 0000  NA 139 140 141  K 4.3 4.4 4.5  CL 102 102 103  CO2 29 26 30   GLUCOSE 101* 107* 104*  BUN 12 12 12   CREATININE 0.77 0.73 0.73  CALCIUM 9.4 9.4 9.9  TSH  --  4.29  --    Liver Function Tests: Recent Labs    08/04/17 0745 12/29/17 0745 04/06/18 0000  AST 18 20 17   ALT 11 11 11   BILITOT 0.9 1.0 1.3*  PROT 7.1 7.2 7.3   No results for input(s): LIPASE, AMYLASE in the last 8760 hours. No results for input(s): AMMONIA in the last 8760 hours. CBC: Recent Labs    06/02/17 0820 08/04/17 0745 04/06/18 0000  WBC 9.0 8.1 8.9  NEUTROABS  --   --  4,610  HGB 13.3  13.2 13.3  HCT 38.8 38.8 39.7  MCV 87.4 88.2 87.4  PLT 275 304 324   Lipid Panel: Recent Labs    06/02/17 0820 12/29/17 0745 04/06/18 0000  CHOL 286* 282* 275*  HDL 54 49* 52  LDLCALC 195* 190* 190*  TRIG 190* 233* 161*  CHOLHDL 5.3* 5.8* 5.3*   Lab Results  Component Value Date   HGBA1C 5.8 (H) 04/06/2018    Procedures since last visit: No results found.  Assessment/Plan Essential hypertension Mildily elevated today Not on any Meds Patient says sh eget nevous in Doctors Office D/W the Caregiver to check her BP 2/week  Age-related osteoporosis without current pathological fracture Tolerating Fosamax Also on Calcium and Vit D Last DEXA in 2018 Hyperlipidemia D/W the Patient and her Helper. That patient LDL is high and she needs statin but it is listed as her Allergy. She will Find out from her Niece who is POA what her Allergy is. Patient does not remember it.AN dit is not in Epic.  Prediabetes Last A1C was 5.8  Late onset Alzheimer's disease without behavioral disturbance  Patient independent in her ADLS right now Her MRI in showed Mild Chronic Vessel Changes Continue on Exelon Patch and Namenda  History of TIA (transient ischemic  attack) On Aspirin D/W the Family to consider Statin BP monitor for better control     Labs/tests ordered:   Next appt:  4 Months.  Total time spent in this patient care encounter was 60_ minutes; greater than 50% of the visit spent counseling patient, reviewing records , Labs and coordinating care for problems addressed at this encounter.

## 2018-06-17 ENCOUNTER — Emergency Department (HOSPITAL_COMMUNITY): Payer: Medicare Other

## 2018-06-17 ENCOUNTER — Encounter (HOSPITAL_COMMUNITY): Payer: Self-pay | Admitting: Emergency Medicine

## 2018-06-17 ENCOUNTER — Emergency Department (HOSPITAL_COMMUNITY)
Admission: EM | Admit: 2018-06-17 | Discharge: 2018-06-17 | Disposition: A | Discharge status: Home / Self Care | Payer: Medicare Other | Attending: Emergency Medicine | Admitting: Emergency Medicine

## 2018-06-17 ENCOUNTER — Other Ambulatory Visit: Payer: Self-pay

## 2018-06-17 DIAGNOSIS — R4182 Altered mental status, unspecified: Secondary | ICD-10-CM | POA: Diagnosis present

## 2018-06-17 DIAGNOSIS — F0390 Unspecified dementia without behavioral disturbance: Secondary | ICD-10-CM | POA: Insufficient documentation

## 2018-06-17 DIAGNOSIS — I10 Essential (primary) hypertension: Secondary | ICD-10-CM | POA: Insufficient documentation

## 2018-06-17 DIAGNOSIS — E119 Type 2 diabetes mellitus without complications: Secondary | ICD-10-CM | POA: Diagnosis not present

## 2018-06-17 DIAGNOSIS — J45909 Unspecified asthma, uncomplicated: Secondary | ICD-10-CM | POA: Insufficient documentation

## 2018-06-17 DIAGNOSIS — R410 Disorientation, unspecified: Secondary | ICD-10-CM

## 2018-06-17 DIAGNOSIS — Z87891 Personal history of nicotine dependence: Secondary | ICD-10-CM | POA: Insufficient documentation

## 2018-06-17 DIAGNOSIS — Z79899 Other long term (current) drug therapy: Secondary | ICD-10-CM | POA: Insufficient documentation

## 2018-06-17 LAB — CBC
HCT: 36.9 % (ref 36.0–46.0)
Hemoglobin: 11.5 g/dL — ABNORMAL LOW (ref 12.0–15.0)
MCH: 28.5 pg (ref 26.0–34.0)
MCHC: 31.2 g/dL (ref 30.0–36.0)
MCV: 91.3 fL (ref 80.0–100.0)
PLATELETS: 223 10*3/uL (ref 150–400)
RBC: 4.04 MIL/uL (ref 3.87–5.11)
RDW: 15.7 % — ABNORMAL HIGH (ref 11.5–15.5)
WBC: 7.9 10*3/uL (ref 4.0–10.5)
nRBC: 0 % (ref 0.0–0.2)

## 2018-06-17 LAB — BASIC METABOLIC PANEL
Anion gap: 9 (ref 5–15)
BUN: 10 mg/dL (ref 8–23)
CALCIUM: 8.9 mg/dL (ref 8.9–10.3)
CO2: 24 mmol/L (ref 22–32)
Chloride: 107 mmol/L (ref 98–111)
Creatinine, Ser: 0.69 mg/dL (ref 0.44–1.00)
GFR calc Af Amer: >60 mL/min (ref >60)
GFR calc non Af Amer: >60 mL/min (ref >60)
Glucose, Bld: 102 mg/dL — ABNORMAL HIGH (ref 70–99)
Potassium: 3.7 mmol/L (ref 3.5–5.1)
Sodium: 140 mmol/L (ref 135–145)

## 2018-06-17 LAB — URINALYSIS, ROUTINE W REFLEX MICROSCOPIC
Bilirubin Urine: NEGATIVE
Glucose, UA: NEGATIVE mg/dL
Ketones, ur: NEGATIVE mg/dL
Nitrite: NEGATIVE
Protein, ur: NEGATIVE mg/dL
Specific Gravity, Urine: 1.006 (ref 1.005–1.030)
pH: 7 (ref 5.0–8.0)

## 2018-06-17 NOTE — ED Provider Notes (Signed)
[facility] Emergency Department Provider Note MRN:  [id number]  Arrival date & time: 06/17/18     Chief Complaint   Altered Mental Status   History of Present Illness   Grace Gilbert is a 85 y.o. year-old female with a history of dementia, diabetes presenting to the ED with chief complaint of altered mental status.  Patient is accompanied by caregiver who provides the history.  Increased confusion for the past 4 days, normally would be able to tell who the president is or what year is it, but currently cannot.  Denies recent fever, no cough, no chest pain, no shortness of breath, no abdominal pain, no dysuria, no recent falls, no medication changes.  Endorsing low back pain for several weeks.  I was unable to obtain an accurate HPI, PMH, or ROS due to the patient's altered mental status.  Review of Systems  A complete 10 system review of systems was obtained and all systems are negative except as noted in the HPI and PMH.   Patient's Health History    Past Medical History:  Diagnosis Date  . Abnormal CT scan, chest September 2011   Scar tissue  . Allergic rhinitis   . Alzheimer's dementia ([place])   . Asthma   . Diabetes mellitus    borderline and under control-diet ,exercise  . Esophageal stricture   . Family history of colon cancer    Mother  . GERD (gastroesophageal reflux disease)   . Hiatal hernia   . HLD (hyperlipidemia)   . Hypertension   . Osteopenia   . Pneumonia 12-15 yrs.ago   yrs. ago  . TIA (transient ischemic attack)   . Vitamin D deficiency     Past Surgical History:  Procedure Laterality Date  . ABDOMINAL HYSTERECTOMY  1980   . CATARACT EXTRACTION Left 2014  . SHOULDER OPEN ROTATOR CUFF REPAIR  05/22/2011   Procedure: ROTATOR CUFF REPAIR SHOULDER OPEN;  Surgeon: Tobi Bastos, MD;  Location: [facility];  Service: Orthopedics;  Laterality: Left;  . THORACOTOMY / DECORTICATION PARIETAL PLEURA Right 204   empyema  . TONSILLECTOMY       No family history on file.  Social History   Socioeconomic History  . Marital status: Single    Spouse name: Not on file  . Number of children: Not on file  . Years of education: Not on file  . Highest education level: Not on file  Occupational History  . Occupation: retired Tour manager  . Financial resource strain: Not on file  . Food insecurity:    Worry: Not on file    Inability: Not on file  . Transportation needs:    Medical: Not on file    Non-medical: Not on file  Tobacco Use  . Smoking status: Former Smoker    Packs/day: 1.50    Years: 20.00    Pack years: 30.00    Types: Cigarettes    Last attempt to quit: 05/15/1987    Years since quitting: 31.1  . Smokeless tobacco: Never Used  Substance and Sexual Activity  . Alcohol use: No    Alcohol/week: 1.0 standard drinks    Types: 1 Glasses of wine per week  . Drug use: No  . Sexual activity: Never  Lifestyle  . Physical activity:    Days per week: Not on file    Minutes per session: Not on file  . Stress: Not on file  Relationships  . Social connections:  Talks on phone: Not on file    Gets together: Not on file    Attends religious service: Not on file    Active member of club or organization: Not on file    Attends meetings of clubs or organizations: Not on file    Relationship status: Not on file  . Intimate partner violence:    Fear of current or ex partner: Not on file    Emotionally abused: Not on file    Physically abused: Not on file    Forced sexual activity: Not on file  Other Topics Concern  . Not on file  Social History Narrative   Lives at [facility] since 07/27/2014   Previous math Pharmacist, hospital.   Never married   Former smoker stopped 1989   Alcohol occasionally    POA niece Terance Hart [place], [place]     Physical Exam  Vital Signs and Nursing Notes reviewed Vitals:   06/17/18 1400 06/17/18 1430  BP: (!) 114/55 123/64  Pulse: (!) 50 (!) 52  Resp: 17 18  Temp:     SpO2: 97% 97%    CONSTITUTIONAL: Well-appearing, NAD NEURO: Awake, alert, oriented to name only, moving all extremities EYES:  eyes equal and reactive ENT/NECK:  no LAD, no JVD CARDIO: Regular rate, well-perfused, normal S1 and S2 PULM:  CTAB no wheezing or rhonchi GI/GU:  normal bowel sounds, non-distended, non-tender MSK/SPINE:  No gross deformities, no edema SKIN:  no rash, atraumatic PSYCH:  Appropriate speech and behavior  Diagnostic and Interventional Summary    EKG Interpretation  Date/Time:  Wednesday June 17 2018 10:39:41 EDT Ventricular Rate:  64 PR Interval:    QRS Duration: 93 QT Interval:  476 QTC Calculation: 492 R Axis:   -50 Text Interpretation:  Sinus rhythm Short PR interval Left anterior fascicular block Consider left ventricular hypertrophy Borderline T abnormalities, anterior leads Borderline prolonged QT interval Confirmed by Gerlene Fee [phone]) on 06/17/2018 1:52:29 PM      Labs Reviewed  CBC - Abnormal; Notable for the following components:      Result Value   Hemoglobin 11.5 (*)    RDW 15.7 (*)    All other components within normal limits  BASIC METABOLIC PANEL - Abnormal; Notable for the following components:   Glucose, Bld 102 (*)    All other components within normal limits  URINALYSIS, ROUTINE W REFLEX MICROSCOPIC - Abnormal; Notable for the following components:   APPearance HAZY (*)    Hgb urine dipstick SMALL (*)    Leukocytes,Ua LARGE (*)    Bacteria, UA FEW (*)    Non Squamous Epithelial 0-5 (*)    All other components within normal limits  URINE CULTURE    DG Chest 2 View  Final Result    DG Lumbar Spine Complete  Final Result    CT HEAD WO CONTRAST  Final Result      Medications - No data to display   Procedures Critical Care  ED Course and Medical Decision Making  I have reviewed the triage vital signs and the nursing notes.  Pertinent labs & imaging results that were available during my care of the patient were  reviewed by me and considered in my medical decision making (see below for details).  Acute on chronic altered mental status in this 85 year old female with history of dementia.  Generally a lack of recent symptoms, contributing to a broad differential diagnosis, including metabolic disarray, UTI, CNS pathology, work-up pending.  Patient lives alone,  the caregiver at bedside currently spends 3 hours with her 3 times a week.  Caregiver is confident that at her current state she would not be able to care for herself adequately in her current living situation.  Clinical Course as of Jun 17 1443  Wed Jun 17, 2018  1425 Case management was able to contact patient's independent living facility, which also has assisted living capabilities.  There are currently no beds available for higher level of care but she is first on the list to get a bed if 1 were to open up.   [MB]    Clinical Course User Index [MB] Maudie Flakes, MD    I had a discussion with patient's POA, who was happy with today's evaluation.  Plan in place to increase patient's home assistance to every day nursing while maintaining an independent living situation for the time being.  The only thing remarkable with patient's work-up would be a urine sample that is likely contaminated, multiple epithelial cells.  Will send for culture.  Does not seem appropriate to treat empirically given the lack of fever, no suprapubic tenderness, no dysuria.  After the discussed management above, the patient was determined to be safe for discharge.  The patient was in agreement with this plan and all questions regarding their care were answered.  ED return precautions were discussed and the patient will return to the ED with any significant worsening of condition.  Barth Kirks. Sedonia Small, MD [place] [email or web address]  Final Clinical Impressions(s) / ED Diagnoses     ICD-10-CM   1. Dementia without  behavioral disturbance, unspecified dementia type ([place]) F03.90   2. Confusion R41.0 DG Chest 2 View    DG Chest 2 View    ED Discharge Orders    None         Maudie Flakes, MD 06/17/18 1447

## 2018-06-17 NOTE — ED Triage Notes (Signed)
Onset 3-4 days ago developed memory loss and lower back pain per care taker at bedside.  Has a history of a stroke and has short term memory loss but this is different.

## 2018-06-17 NOTE — Discharge Instructions (Addendum)
You were evaluated in the Emergency Department and after careful evaluation, we did not find any emergent condition requiring admission or further testing in the hospital.  Your testing today was largely reassuring.  There is still a small chance that you have a urinary tract infection.  We will be sending the sample to the lab for further testing and you will be called with any abnormal results.  If positive we will be able to call in a prescription.  Please return to the Emergency Department if you experience any worsening of your condition.  We encourage you to follow up with a primary care provider.  Thank you for allowing [place] to be a part of your care.

## 2018-06-17 NOTE — ED Notes (Signed)
Got patient undress on the monitor did ekg shown to er doctor patient is resting with call bell in reach 

## 2018-06-17 NOTE — TOC Transition Note (Signed)
Transition of Care [facility]) - CM/SW Discharge Note   Patient Details  Name: Grace Gilbert MRN: [id number] Date of Birth: 1933/03/01  [facility] CM/SW Contact:  Fuller Mandril, RN Phone Number: 06/17/2018, 2:40 PM   Clinical Narrative:    [facility] consulted regarding pt returning to ILF vs higher level of care.  EDCM contacted medical staff at [facility] to find that they are actively following pt to advance her to ALF when the time arises.     Final next level of care: [place] Barriers to Discharge: Barriers Resolved   Patient Goals and CMS Choice Patient states their goals for this hospitalization and ongoing recovery are:: memory loss, confused      Discharge Placement    [facility]                   Discharge Plan and Services Discharge Planning Services: CM Consult            DME Arranged: N/A   HH Arranged: NA     Social Determinants of Health (SDOH) Interventions     Readmission Risk Interventions No flowsheet data found.

## 2018-06-18 LAB — URINE CULTURE

## 2018-07-07 ENCOUNTER — Encounter: Payer: Self-pay | Admitting: Internal Medicine

## 2018-07-07 ENCOUNTER — Non-Acute Institutional Stay (SKILLED_NURSING_FACILITY): Payer: Medicare Other | Admitting: Internal Medicine

## 2018-07-07 DIAGNOSIS — M810 Age-related osteoporosis without current pathological fracture: Secondary | ICD-10-CM

## 2018-07-07 DIAGNOSIS — G301 Alzheimer's disease with late onset: Secondary | ICD-10-CM | POA: Diagnosis not present

## 2018-07-07 DIAGNOSIS — F0280 Dementia in other diseases classified elsewhere without behavioral disturbance: Secondary | ICD-10-CM

## 2018-07-07 DIAGNOSIS — E7849 Other hyperlipidemia: Secondary | ICD-10-CM

## 2018-07-07 DIAGNOSIS — I10 Essential (primary) hypertension: Secondary | ICD-10-CM | POA: Diagnosis not present

## 2018-07-07 NOTE — Progress Notes (Addendum)
Provider:  Veleta Miners L,MD  Location:  [facility] Room Number: 9 Place of Service:  SNF (31)  PCP: [name], MD Patient Care Team: [name], MD as PCP - General (Internal Medicine) Lindwood Coke, MD as Consulting Physician (Dermatology) Christophe Louis, MD as Consulting Physician (Obstetrics and Gynecology) Latanya Maudlin, MD as Consulting Physician (Orthopedic Surgery) Garlan Fair, MD as Consulting Physician (Gastroenterology) Barnett Abu., MD as Consulting Physician (Cardiology) Neldon Mc, Donnamarie Poag, MD as Consulting Physician (Allergy and Immunology) Rexene Alberts, MD as Consulting Physician (Cardiothoracic Surgery) Melida Quitter, MD as Consulting Physician (Otolaryngology) Ngetich, Nelda Bucks, NP as Nurse Practitioner (Family Medicine)  Extended Emergency Contact Information Primary Emergency Contact: Kathie Rhodes of [place] Phone: [phone] Mobile Phone: [phone] Relation: Legal Guardian Secondary Emergency Contact: Leana Gamer [place] of [place] Phone: [phone] Relation: Uncle  Code Status:Managed Care  Goals of Care: Advanced Directive information Advanced Directives 07/07/2018  Does Patient Have a Medical Advance Directive? Yes  Type of Advance Directive [place]  Does patient want to make changes to medical advance directive? No - Patient declined  Copy of [place] in Chart? Yes - validated most recent copy scanned in chart (See row information)  Pre-existing out of facility DNR order (yellow form or pink MOST form) -      Chief Complaint  Patient presents with   New Admit To SNF    new admit to facility     HPI: Patient is a 85 y.o. female seen today for admission to SNF for Therapy and Long term Care  Patient has h/o Hyperlipidemia, Osteoporosis, Allergic Rhinitis and Dementia Alzheimer. [place]  requiring higher Level of care. She was living by herself in [place] apartment with Caregiver helping her few hours a day. Her cognitive impairnment got worse that she  took her to ED. Her work up in ED  was negative.  She had a CT scan done which Showed atrophy with small vessel disease but no acute infarct.  Her caregiver realized that she was needing higher level of care.  She [facility] by [facility] and is now transferred to SNF.   Per nurses patient is unable to perform her ADLs because of her of her cognition.  She is otherwise able to walk and has no gait impairment. Patient unable to give me any history.  She says that she does not remember why she is here.  She has a very good insight and knows that she has cognition issues. No fever or chills. 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 Has no Children. Her Niece who lives Out of state is her POA   Past Medical History:  Diagnosis Date   Abnormal CT scan, chest September 2011   Scar tissue   Allergic rhinitis    Alzheimer's dementia ([place])    Asthma    Diabetes mellitus    borderline and under control-diet ,exercise   Esophageal stricture    Family history of colon cancer    Mother   GERD (gastroesophageal reflux disease)    Hiatal hernia    HLD (hyperlipidemia)    Hypertension    Osteopenia    Pneumonia 12-15 yrs.ago   yrs. ago   TIA (transient ischemic attack)    Vitamin D deficiency    Past Surgical History:  Procedure Laterality Date   ABDOMINAL HYSTERECTOMY  1980  CATARACT EXTRACTION Left 2014   SHOULDER OPEN ROTATOR CUFF REPAIR  05/22/2011   Procedure: ROTATOR CUFF REPAIR SHOULDER OPEN;  Surgeon: Tobi Bastos, MD;  Location: [facility];  Service: Orthopedics;  Laterality: Left;   THORACOTOMY / DECORTICATION PARIETAL PLEURA Right 204   empyema   TONSILLECTOMY      reports that she quit smoking about 31 years ago. Her smoking use included cigarettes. She has a 30.00 pack-year  smoking history. She has never used smokeless tobacco. She reports that she does not drink alcohol or use drugs. Social History   Socioeconomic History   Marital status: Single    Spouse name: Not on file   Number of children: Not on file   Years of education: Not on file   Highest education level: Not on file  Occupational History   Occupation: retired Scientist, research (physical sciences) strain: Not on file   Food insecurity:    Worry: Not on file    Inability: Not on file   Transportation needs:    Medical: Not on file    Non-medical: Not on file  Tobacco Use   Smoking status: Former Smoker    Packs/day: 1.50    Years: 20.00    Pack years: 30.00    Types: Cigarettes    Last attempt to quit: 05/15/1987    Years since quitting: 31.1   Smokeless tobacco: Never Used  Substance and Sexual Activity   Alcohol use: No    Alcohol/week: 1.0 standard drinks    Types: 1 Glasses of wine per week   Drug use: No   Sexual activity: Never  Lifestyle   Physical activity:    Days per week: Not on file    Minutes per session: Not on file   Stress: Not on file  Relationships   Social connections:    Talks on phone: Not on file    Gets together: Not on file    Attends religious service: Not on file    Active member of club or organization: Not on file    Attends meetings of clubs or organizations: Not on file    Relationship status: Not on file   Intimate partner violence:    Fear of current or ex partner: Not on file    Emotionally abused: Not on file    Physically abused: Not on file    Forced sexual activity: Not on file  Other Topics Concern   Not on file  Social History Narrative   L[facility] at [facility] since 07/27/2014   Previous math Pharmacist, hospital.   Never married   Former smoker stopped 1989   Alcohol occasionally    POA niece Terance Hart [place], [place]    Functional Status Survey:    History reviewed. No pertinent family  history.  Health Maintenance  Topic Date Due   FOOT EXAM  12/06/1942   OPHTHALMOLOGY EXAM  [birth year]06/1942   URINE MICROALBUMIN  02/11/2017   HEMOGLOBIN A1C  10/05/2018   INFLUENZA VACCINE  10/31/2018   TETANUS/TDAP  12/16/2021   DEXA SCAN  Completed   PNA vac Low Risk Adult  Completed    Allergies  Allergen Reactions   Aricept [Donepezil Hcl] Diarrhea   Crestor [Rosuvastatin Calcium]     Myalgia   Lovastatin     Myalgia   Micardis [Telmisartan]     Fatigue   Sertraline Diarrhea   Welchol [Colesevelam Hcl]     Constipation   Zetia [Ezetimibe]  Myalgia   Zocor [Simvastatin]     Myalgia    Outpatient Encounter Medications as of 07/07/2018  Medication Sig   alendronate (FOSAMAX) 70 MG tablet TAKE 1 TAB ONCE A WEEK, AT LEAST 30 MIN BEFORE 1ST FOOD.DO NOT LIE DOWN FOR 30 MIN AFTER TAKING.   aspirin EC 81 MG tablet Take 1 tablet (81 mg total) by mouth daily.   calcium-vitamin D (OSCAL WITH D) 500-200 MG-UNIT tablet Take 1 tablet by mouth daily with breakfast.   cetirizine (ZYRTEC) 10 MG tablet Take 10 mg by mouth at bedtime.    memantine (NAMENDA XR) 28 MG CP24 24 hr capsule TAKE ONE CAPSULE ONCE A DAY TO PRESERVE MEMORY.   Omega-3 Fatty Acids (FISH OIL OMEGA-3) 1000 MG CAPS Take 1 capsule by mouth daily.   rivastigmine (EXELON) 9.5 mg/24hr Apply fresh patch daily and remove old patch to help preserve memory   zinc oxide 20 % ointment Apply 1 application topically as needed for irritation. Apply to peri/buttocks topical   No facility-administered encounter medications on file as of 07/07/2018.     Review of Systems  Review of Systems  Constitutional: Negative for activity change, appetite change, chills, diaphoresis, fatigue and fever.  HENT: Negative for mouth sores, postnasal drip, rhinorrhea, sinus pain and sore throat.   Respiratory: Negative for apnea, cough, chest tightness, shortness of breath and wheezing.   Cardiovascular: Negative for chest  pain, palpitations and leg swelling.  Gastrointestinal: Negative for abdominal distention, abdominal pain, constipation, diarrhea, nausea and vomiting.  Genitourinary: Negative for dysuria and frequency.  Musculoskeletal: Negative for arthralgias, joint swelling and myalgias.  Skin: Negative for rash.  Neurological: Negative for dizziness, syncope, weakness, light-headedness and numbness.  Psychiatric/Behavioral: Negative for behavioral problems, confusion and sleep disturbance.      Vitals:   07/07/18 0908  BP: (!) 145/76  Pulse: 62  Resp: 16  Temp: 98.1 F (36.7 C)  SpO2: 95%  Weight: 137 lb 8 oz (62.4 kg)  Height: 5' (1.524 m)   Body mass index is 26.85 kg/m. Physical Exam Vitals signs reviewed.  Constitutional:      Appearance: Normal appearance.  HENT:     Head: Normocephalic.     Nose: Nose normal.     Mouth/Throat:     Mouth: Mucous membranes are moist.     Pharynx: Oropharynx is clear.  Eyes:     Pupils: Pupils are equal, round, and reactive to light.  Neck:     Musculoskeletal: Neck supple.  Cardiovascular:     Rate and Rhythm: Normal rate and regular rhythm.  Pulmonary:     Effort: Pulmonary effort is normal.     Breath sounds: Normal breath sounds.  Abdominal:     General: Abdomen is flat. Bowel sounds are normal.     Palpations: Abdomen is soft.  Musculoskeletal:        General: No swelling.  Skin:    General: Skin is warm and dry.  Neurological:     General: No focal deficit present.     Mental Status: She is alert.     Comments: She is not Oriented to Place or time. Does follow commands and Answers Apporpriately  Psychiatric:        Mood and Affect: Mood normal.        Thought Content: Thought content normal.        Judgment: Judgment normal.     Labs reviewed: Basic Metabolic Panel: Recent Labs    12/29/17 0745 04/06/18 0000  06/17/18 1118  NA 140 141 140  K 4.4 4.5 3.7  CL 102 103 107  CO2 26 30 24   GLUCOSE 107* 104* 102*  BUN  12 12 10   CREATININE 0.73 0.73 0.69  CALCIUM 9.4 9.9 8.9   Liver Function Tests: Recent Labs    08/04/17 0745 12/29/17 0745 04/06/18 0000  AST 18 20 17   ALT 11 11 11   BILITOT 0.9 1.0 1.3*  PROT 7.1 7.2 7.3   No results for input(s): LIPASE, AMYLASE in the last 8760 hours. No results for input(s): AMMONIA in the last 8760 hours. CBC: Recent Labs    08/04/17 0745 04/06/18 0000 06/17/18 1118  WBC 8.1 8.9 7.9  NEUTROABS  --  4,610  --   HGB 13.2 13.3 11.5*  HCT 38.8 39.7 36.9  MCV 88.2 87.4 91.3  PLT 304 324 223   Cardiac Enzymes: No results for input(s): CKTOTAL, CKMB, CKMBINDEX, TROPONINI in the last 8760 hours. BNP: Invalid input(s): POCBNP Lab Results  Component Value Date   HGBA1C 5.8 (H) 04/06/2018   Lab Results  Component Value Date   TSH 4.29 12/29/2017   No results found for: VITAMINB12 No results found for: FOLATE No results found for: IRON, TIBC, FERRITIN  Imaging and Procedures obtained prior to SNF admission: Dg Chest 2 View  Result Date: 06/17/2018 CLINICAL DATA:  Altered mental status. Back pain. EXAM: CHEST - 2 VIEW COMPARISON:  03/29/2013. FINDINGS: Interval borderline enlarged cardiac silhouette and mild diffuse interstitial prominence. Stable linear scarring at the left lung base. Decreased right pleural fluid or thickening. Diffuse osteopenia and mild scoliosis. Superior migration of the right humeral head compatible with a large, chronic rotator cuff tear. IMPRESSION: 1. Interval borderline cardiomegaly and mild chronic interstitial lung disease. 2. Decreased right pleural fluid or thickening. Electronically Signed   By: Claudie Revering M.D.   On: 06/17/2018 12:51   Dg Lumbar Spine Complete  Result Date: 06/17/2018 CLINICAL DATA:  Low back pain. No known injury. EXAM: LUMBAR SPINE - COMPLETE 4+ VIEW COMPARISON:  None. FINDINGS: Five non-rib-bearing lumbar vertebrae. Extensive degenerative changes throughout the lumbar spine with mild levoconvex  scoliosis. These include facet degenerative changes with associated grade 1 anterolisthesis at the L5-S1 level and grade 1 retrolisthesis at the L1-2, L2-3 and L3-4 levels. Lower thoracic spine degenerative changes. No fractures or pars defects. Mild atheromatous arterial calcifications. IMPRESSION: Extensive degenerative changes. No acute abnormality. Electronically Signed   By: Claudie Revering M.D.   On: 06/17/2018 12:53   Ct Head Wo Contrast  Result Date: 06/17/2018 CLINICAL DATA:  Confusion EXAM: CT HEAD WITHOUT CONTRAST TECHNIQUE: Contiguous axial images were obtained from the base of the skull through the vertex without intravenous contrast. COMPARISON:  Brain MRI April 21, 2013 FINDINGS: Brain: There is mild diffuse atrophy. There is no intracranial mass, hemorrhage, extra-axial fluid collection, or midline shift. There is patchy small vessel disease in the centra semiovale bilaterally. There is a prior tiny lacunar infarct in the anterior left lentiform nucleus. There is small vessel disease in each thalamus. No acute infarct is demonstrable on this study. Vascular: There is no hyperdense vessel. There are foci of calcification in each carotid siphon region, more on the right than on the left. Skull: The bony calvarium appears intact. Sinuses/Orbits: There is mucosal thickening and opacification in several ethmoid air cells. Other paranasal sinuses are clear. Orbits appear symmetric bilaterally. Other: Mastoid air cells are clear. IMPRESSION: Atrophy with supratentorial small vessel disease. No evident acute infarct.  No mass or hemorrhage. There are foci of arterial vascular calcification. There are foci of ethmoid sinus disease. Electronically Signed   By: Lowella Grip III M.D.   On: 06/17/2018 12:18    Assessment/Plan Essential hypertension BP mildily high Will continue to monitor in facility Not on any Meds  Late onset Alzheimer's disease without behavioral disturbance  Her symptoms are  progressing fast She is now needing help with her ADLS. She will stay in SNF She is going to work with Therapy.And Speech She is on Exelon Patch and Namenda  Age-related osteoporosis without current pathological fracture Her T Score is -2.6 in 2018 She is on Fosamax  Hyperlipidemia Patient would benefit from Statin but she is listed as allergic to them  H/o TIA On aspirin Will monitor BP Cannot use Statin due to allergy listed Will d/w her POA to start her on Statin with Close monitoring in facility    Family/ staff Communication:   Labs/tests ordered:  Total time spent in this patient care encounter was 45_ minutes; greater than 50% of the visit spent counseling patient, reviewing records , Labs and coordinating care for problems addressed at this encounter.

## 2018-08-04 ENCOUNTER — Encounter: Payer: Self-pay | Admitting: Nurse Practitioner

## 2018-08-04 ENCOUNTER — Non-Acute Institutional Stay (SKILLED_NURSING_FACILITY): Payer: Medicare Other | Admitting: Nurse Practitioner

## 2018-08-04 DIAGNOSIS — Z8739 Personal history of other diseases of the musculoskeletal system and connective tissue: Secondary | ICD-10-CM

## 2018-08-04 DIAGNOSIS — J309 Allergic rhinitis, unspecified: Secondary | ICD-10-CM | POA: Diagnosis not present

## 2018-08-04 DIAGNOSIS — G301 Alzheimer's disease with late onset: Secondary | ICD-10-CM | POA: Diagnosis not present

## 2018-08-04 DIAGNOSIS — F0280 Dementia in other diseases classified elsewhere without behavioral disturbance: Secondary | ICD-10-CM

## 2018-08-04 NOTE — Assessment & Plan Note (Signed)
Stable, continue Zyrtec 10mg qd 

## 2018-08-04 NOTE — Assessment & Plan Note (Signed)
Continue SNF [facility] for safety and care assistance, continue Rivastigmine 9.5mg /24hr, Memantine 10mg  bid for memory.

## 2018-08-04 NOTE — Assessment & Plan Note (Signed)
Unable to reach back of her head, no pain, assistance the patient with ADLs.

## 2018-08-04 NOTE — Progress Notes (Signed)
Location:   [facility] [place] Room Number: 9/A Place of Service:  SNF (31) Provider: [facility] Johnpaul Gillentine NP  [name], MD  Patient Care Team: [name], MD as PCP - General (Internal Medicine) Lindwood Coke, MD as Consulting Physician (Dermatology) Christophe Louis, MD as Consulting Physician (Obstetrics and Gynecology) Latanya Maudlin, MD as Consulting Physician (Orthopedic Surgery) Garlan Fair, MD as Consulting Physician (Gastroenterology) Barnett Abu., MD as Consulting Physician (Cardiology) Neldon Mc Donnamarie Poag, MD as Consulting Physician (Allergy and Immunology) Rexene Alberts, MD as Consulting Physician (Cardiothoracic Surgery) Melida Quitter, MD as Consulting Physician (Otolaryngology) Ngetich, Nelda Bucks, NP as Nurse Practitioner (Family Medicine)  Extended Emergency Contact Information Primary Emergency Contact: Kathie Rhodes of [place] Phone: [phone] Mobile Phone: [phone] Relation: Legal Guardian Secondary Emergency Contact: Leana Gamer [place] of [place] Phone: [phone] Relation: Uncle  Code Status:  DNR Goals of care: Advanced Directive information Advanced Directives 08/04/2018  Does Patient Have a Medical Advance Directive? Yes  Type of Advance Directive [place]  Does patient want to make changes to medical advance directive? No - Patient declined  Copy of [place] in Chart? Yes - validated most recent copy scanned in chart (See row information)  Pre-existing out of facility DNR order (yellow form or pink MOST form) -     Chief Complaint  Patient presents with  . Medical Management of Chronic Issues    Routine visit     HPI:  Pt is a 85 y.o. female seen today for medical management of chronic diseases.    The patient resides in SNF [facility] for safety and care assistance, on Rivastigmine 9.5mg /24hr, Memantine 28mg  qd for memory. Allergic rhinitis, stable on  Zyrtec 10mg  qd. Hx of Rotator cuff tear, chronic decreased ROM in shoulders, but no pain today.   Past Medical History:  Diagnosis Date  . Abnormal CT scan, chest September 2011   Scar tissue  . Allergic rhinitis   . Alzheimer's dementia ([place])   . Asthma   . Diabetes mellitus    borderline and under control-diet ,exercise  . Esophageal stricture   . Family history of colon cancer    Mother  . GERD (gastroesophageal reflux disease)   . Hiatal hernia   . HLD (hyperlipidemia)   . Hypertension   . Osteopenia   . Pneumonia 12-15 yrs.ago   yrs. ago  . TIA (transient ischemic attack)   . Vitamin D deficiency    Past Surgical History:  Procedure Laterality Date  . ABDOMINAL HYSTERECTOMY  1980   . CATARACT EXTRACTION Left 2014  . SHOULDER OPEN ROTATOR CUFF REPAIR  05/22/2011   Procedure: ROTATOR CUFF REPAIR SHOULDER OPEN;  Surgeon: Tobi Bastos, MD;  Location: [facility];  Service: Orthopedics;  Laterality: Left;  . THORACOTOMY / DECORTICATION PARIETAL PLEURA Right 204   empyema  . TONSILLECTOMY      Allergies  Allergen Reactions  . Aricept [Donepezil Hcl] Diarrhea  . Crestor [Rosuvastatin Calcium]     Myalgia  . Lipitor [Atorvastatin Calcium]   . Lovastatin     Myalgia  . Micardis [Telmisartan]     Fatigue  . Sertraline Diarrhea  . Welchol [Colesevelam Hcl]     Constipation  . Zetia [Ezetimibe]     Myalgia  . Zocor [Simvastatin]     Myalgia    Allergies as of 08/04/2018      Reactions   Aricept [donepezil Hcl] Diarrhea  Crestor [rosuvastatin Calcium]    Myalgia   Lipitor [atorvastatin Calcium]    Lovastatin    Myalgia   Micardis [telmisartan]    Fatigue   Sertraline Diarrhea   Welchol [colesevelam Hcl]    Constipation   Zetia [ezetimibe]    Myalgia   Zocor [simvastatin]    Myalgia      Medication List       Accurate as of Aug 04, 2018 12:37 PM. Always use your most recent med list.        alendronate 70 MG tablet Commonly known as:  FOSAMAX TAKE  1 TAB ONCE A WEEK, AT LEAST 30 MIN BEFORE 1ST FOOD.DO NOT LIE DOWN FOR 30 MIN AFTER TAKING.   aspirin EC 81 MG tablet Take 1 tablet (81 mg total) by mouth daily.   calcium-vitamin D 500-200 MG-UNIT tablet Commonly known as:  OSCAL WITH D Take 1 tablet by mouth daily with breakfast.   cetirizine 10 MG tablet Commonly known as:  ZYRTEC Take 10 mg by mouth at bedtime.   Fish Oil Omega-3 1000 MG Caps Take 1 capsule by mouth daily.   memantine 28 MG Cp24 24 hr capsule Commonly known as:  NAMENDA XR TAKE ONE CAPSULE ONCE A DAY TO PRESERVE MEMORY.   rivastigmine 9.5 mg/24hr Commonly known as:  EXELON Apply fresh patch daily and remove old patch to help preserve memory   zinc oxide 20 % ointment Apply 1 application topically as needed for irritation. Apply to peri/buttocks topical      ROS was provided with assistance of staff Review of Systems  Constitutional: Negative for activity change, appetite change, chills, diaphoresis, fatigue, fever and unexpected weight change.  HENT: Positive for hearing loss. Negative for congestion and voice change.   Respiratory: Negative for cough, shortness of breath and wheezing.   Cardiovascular: Negative for chest pain, palpitations and leg swelling.  Gastrointestinal: Negative for abdominal distention, abdominal pain, constipation, diarrhea, nausea and vomiting.  Genitourinary: Negative for difficulty urinating, dysuria and urgency.  Musculoskeletal: Positive for arthralgias and gait problem.  Skin: Negative for color change and pallor.  Neurological: Negative for dizziness, speech difficulty, weakness and headaches.       Memory dificit  Psychiatric/Behavioral: Negative for agitation, behavioral problems, hallucinations and sleep disturbance.    Immunization History  Administered Date(s) Administered  . Influenza,inj,Quad PF,6+ Mos 12/29/2017  . Influenza-Unspecified 12/23/2013, 12/01/2014, 01/11/2016  . Pneumococcal Conjugate-13  11/22/2013  . Pneumococcal Polysaccharide-23 07/01/2006  . Tdap 12/17/2011  . Zoster 12/25/2005   Pertinent  Health Maintenance Due  Topic Date Due  . FOOT EXAM  12/06/1942  . OPHTHALMOLOGY EXAM  12/06/1942  . URINE MICROALBUMIN  02/11/2017  . HEMOGLOBIN A1C  10/05/2018  . INFLUENZA VACCINE  10/31/2018  . DEXA SCAN  Completed  . PNA vac Low Risk Adult  Completed   Fall Risk  06/17/2018 04/22/2018 01/22/2017 07/23/2016 04/25/2015  Falls in the past year? - 0 No No No  Number falls in past yr: - 0 - - -  Injury with Fall? - 0 - - -  Risk for fall due to : Impaired vision - - - -   Functional Status Survey:    Vitals:   08/04/18 0842  BP: (!) 162/82  Pulse: 74  Resp: 18  Temp: 97.7 F (36.5 C)  SpO2: 97%  Weight: 134 lb 12.8 oz (61.1 kg)  Height: 5' (1.524 m)   Body mass index is 26.33 kg/m. Physical Exam Constitutional:  General: She is not in acute distress.    Appearance: Normal appearance. She is not ill-appearing, toxic-appearing or diaphoretic.     Comments: Over weight  HENT:     Head: Normocephalic and atraumatic.     Nose: Nose normal. No congestion or rhinorrhea.     Mouth/Throat:     Mouth: Mucous membranes are moist.  Eyes:     Extraocular Movements: Extraocular movements intact.     Conjunctiva/sclera: Conjunctivae normal.     Pupils: Pupils are equal, round, and reactive to light.  Neck:     Musculoskeletal: Normal range of motion and neck supple.  Cardiovascular:     Rate and Rhythm: Normal rate and regular rhythm.     Heart sounds: No murmur.  Pulmonary:     Effort: Pulmonary effort is normal.     Breath sounds: No wheezing, rhonchi or rales.  Abdominal:     General: There is no distension.     Palpations: Abdomen is soft.     Tenderness: There is no abdominal tenderness. There is no right CVA tenderness, left CVA tenderness, guarding or rebound.  Musculoskeletal:     Right lower leg: No edema.     Left lower leg: No edema.  Skin:     General: Skin is warm and dry.  Neurological:     General: No focal deficit present.     Mental Status: She is alert. Mental status is at baseline.     Motor: No weakness.     Coordination: Coordination normal.     Gait: Gait normal.     Comments: Oriented to self, follows simple directions.   Psychiatric:        Mood and Affect: Mood normal.     Labs reviewed: Recent Labs    12/29/17 0745 04/06/18 0000 06/17/18 1118  NA 140 141 140  K 4.4 4.5 3.7  CL 102 103 107  CO2 26 30 24   GLUCOSE 107* 104* 102*  BUN 12 12 10   CREATININE 0.73 0.73 0.69  CALCIUM 9.4 9.9 8.9   Recent Labs    12/29/17 0745 04/06/18 0000  AST 20 17  ALT 11 11  BILITOT 1.0 1.3*  PROT 7.2 7.3   Recent Labs    04/06/18 0000 06/17/18 1118  WBC 8.9 7.9  NEUTROABS 4,610  --   HGB 13.3 11.5*  HCT 39.7 36.9  MCV 87.4 91.3  PLT 324 223   Lab Results  Component Value Date   TSH 4.29 12/29/2017   Lab Results  Component Value Date   HGBA1C 5.8 (H) 04/06/2018   Lab Results  Component Value Date   CHOL 275 (H) 04/06/2018   HDL 52 04/06/2018   LDLCALC 190 (H) 04/06/2018   TRIG 161 (H) 04/06/2018   CHOLHDL 5.3 (H) 04/06/2018    Significant Diagnostic Results in last 30 days:  No results found.  Assessment/Plan Allergic rhinitis Stable, continue Zyrtec 10mg  qd.   Alzheimer's dementia Continue SNF [facility] for safety and care assistance, continue Rivastigmine 9.5mg /24hr, Memantine 10mg  bid for memory.   History of rotator cuff tear Unable to reach back of her head, no pain, assistance the patient with ADLs.    Family/ staff Communication: plan of care reviewed with the patient and charge nurse.   Labs/tests ordered: none  Time spend 25 minutes.

## 2018-08-26 ENCOUNTER — Encounter: Payer: Medicare Other | Admitting: Internal Medicine

## 2018-08-26 ENCOUNTER — Other Ambulatory Visit: Payer: Self-pay

## 2018-09-03 ENCOUNTER — Non-Acute Institutional Stay (SKILLED_NURSING_FACILITY): Payer: Medicare Other | Admitting: Family

## 2018-09-03 ENCOUNTER — Encounter: Payer: Self-pay | Admitting: Family

## 2018-09-03 DIAGNOSIS — G301 Alzheimer's disease with late onset: Secondary | ICD-10-CM

## 2018-09-03 DIAGNOSIS — M810 Age-related osteoporosis without current pathological fracture: Secondary | ICD-10-CM | POA: Diagnosis not present

## 2018-09-03 DIAGNOSIS — E782 Mixed hyperlipidemia: Secondary | ICD-10-CM | POA: Diagnosis not present

## 2018-09-03 DIAGNOSIS — I10 Essential (primary) hypertension: Secondary | ICD-10-CM | POA: Diagnosis not present

## 2018-09-03 DIAGNOSIS — F0280 Dementia in other diseases classified elsewhere without behavioral disturbance: Secondary | ICD-10-CM

## 2018-09-03 NOTE — Progress Notes (Signed)
Location:  [place] Room Number: 9 Place of Service:  SNF [phone]) Provider: Juan Olthoff FNP-C   [name], MD  Patient Care Team: [name], MD as PCP - General (Internal Medicine) Lindwood Coke, MD as Consulting Physician (Dermatology) Christophe Louis, MD as Consulting Physician (Obstetrics and Gynecology) Latanya Maudlin, MD as Consulting Physician (Orthopedic Surgery) Garlan Fair, MD as Consulting Physician (Gastroenterology) Barnett Abu., MD as Consulting Physician (Cardiology) Neldon Mc Donnamarie Poag, MD as Consulting Physician (Allergy and Immunology) Rexene Alberts, MD as Consulting Physician (Cardiothoracic Surgery) Melida Quitter, MD as Consulting Physician (Otolaryngology) Khamil Lamica, Nelda Bucks, NP as Nurse Practitioner (Family Medicine)  Extended Emergency Contact Information Primary Emergency Contact: Kathie Rhodes of [place] Phone: [phone] Mobile Phone: [phone] Relation: Legal Guardian Secondary Emergency Contact: Leana Gamer [place] of [place] Phone: [phone] Relation: Uncle  Code Status: Full Code  Goals of care: Advanced Directive information Advanced Directives 09/03/2018  Does Patient Have a Medical Advance Directive? Yes  Type of Advance Directive [place]  Does patient want to make changes to medical advance directive? No - Patient declined  Copy of [place] in Chart? Yes - validated most recent copy scanned in chart (See row information)  Pre-existing out of facility DNR order (yellow form or pink MOST form) -     Chief Complaint  Patient presents with   Medical Management of Chronic Issues    Routine Visit    HPI:  Pt is a 85 y.o. female seen today [place] for medical management of chronic diseases.she has a medical history of Hypertension,Hyperlipidemia,Alzheimer's dementia,Allergic  rhinitis,Ostoeporosis,prediabetes,Hx TIA among other condition.she is seen in her room sitting on her chair.she denies any acute issues during visit though HPI limited due to her cognitive impairment.Facility Nurse reports patient continues to require assistance with her ADL's.she completed working with physical therapy on 08/31/2018 but continues to work with Speech Therapist 3 x per week for [place] [employer] and executive functioning.No recent fall episode reported.she has had a progressive weight loss two pounds loss over two months WT 137.5 lbs (07/01/2018); WT 136 lbs (08/05/2018);WT 135 lbs (09/02/2018).   Past Medical History:  Diagnosis Date   Abnormal CT scan, chest September 2011   Scar tissue   Allergic rhinitis    Alzheimer's dementia ([place])    Asthma    Diabetes mellitus    borderline and under control-diet ,exercise   Esophageal stricture    Family history of colon cancer    Mother   GERD (gastroesophageal reflux disease)    Hiatal hernia    HLD (hyperlipidemia)    Hypertension    Osteopenia    Pneumonia 12-15 yrs.ago   yrs. ago   TIA (transient ischemic attack)    Vitamin D deficiency    Past Surgical History:  Procedure Laterality Date   ABDOMINAL HYSTERECTOMY  1980    CATARACT EXTRACTION Left 2014   SHOULDER OPEN ROTATOR CUFF REPAIR  05/22/2011   Procedure: ROTATOR CUFF REPAIR SHOULDER OPEN;  Surgeon: Tobi Bastos, MD;  Location: [facility];  Service: Orthopedics;  Laterality: Left;   THORACOTOMY / DECORTICATION PARIETAL PLEURA Right 204   empyema   TONSILLECTOMY      Allergies  Allergen Reactions   Aricept [Donepezil Hcl] Diarrhea   Crestor [Rosuvastatin Calcium]     Myalgia   Lipitor [Atorvastatin Calcium]    Lovastatin     Myalgia   Micardis [Telmisartan]  Fatigue   Sertraline Diarrhea   Welchol [Colesevelam Hcl]     Constipation   Zetia [Ezetimibe]     Myalgia   Zocor [Simvastatin]     Myalgia    Allergies  as of 09/03/2018      Reactions   Aricept [donepezil Hcl] Diarrhea   Crestor [rosuvastatin Calcium]    Myalgia   Lipitor [atorvastatin Calcium]    Lovastatin    Myalgia   Micardis [telmisartan]    Fatigue   Sertraline Diarrhea   Welchol [colesevelam Hcl]    Constipation   Zetia [ezetimibe]    Myalgia   Zocor [simvastatin]    Myalgia      Medication List       Accurate as of September 03, 2018  1:18 PM. If you have any questions, ask your nurse or doctor.        alendronate 70 MG tablet Commonly known as:  FOSAMAX TAKE 1 TAB ONCE A WEEK, AT LEAST 30 MIN BEFORE 1ST FOOD.DO NOT LIE DOWN FOR 30 MIN AFTER TAKING.   aspirin EC 81 MG tablet Take 1 tablet (81 mg total) by mouth daily.   calcium-vitamin D 500-200 MG-UNIT tablet Commonly known as:  OSCAL WITH D Take 1 tablet by mouth daily with breakfast.   cetirizine 10 MG tablet Commonly known as:  ZYRTEC Take 10 mg by mouth at bedtime.   Fish Oil Omega-3 1000 MG Caps Take 1 capsule by mouth daily.   memantine 28 MG Cp24 24 hr capsule Commonly known as:  NAMENDA XR TAKE ONE CAPSULE ONCE A DAY TO PRESERVE MEMORY.   rivastigmine 9.5 mg/24hr Commonly known as:  EXELON Apply fresh patch daily and remove old patch to help preserve memory   zinc oxide 20 % ointment Apply 1 application topically as needed for irritation. Apply to peri/buttocks topical       Review of Systems  Unable to perform ROS: Dementia (additional information provided by facility Nurse )  Constitutional: Negative for appetite change, chills, fatigue and fever.  HENT: Positive for hearing loss. Negative for congestion, rhinorrhea, sinus pressure, sinus pain, sneezing and sore throat.   Eyes: Positive for visual disturbance. Negative for discharge, redness and itching.       Wears eye glasses   Respiratory: Negative for cough, chest tightness, shortness of breath and wheezing.   Cardiovascular: Negative for chest pain, palpitations and leg swelling.    Gastrointestinal: Negative for abdominal distention, abdominal pain, constipation, diarrhea, nausea and vomiting.  Endocrine: Negative for cold intolerance, heat intolerance, polydipsia, polyphagia and polyuria.  Genitourinary: Negative for difficulty urinating, dyspareunia, dysuria, flank pain, frequency and urgency.  Musculoskeletal: Negative for arthralgias and gait problem.  Skin: Negative for color change, pallor, rash and wound.  Neurological: Negative for dizziness, light-headedness and headaches.  Hematological: Does not bruise/bleed easily.  Psychiatric/Behavioral: Positive for confusion. Negative for agitation, behavioral problems and sleep disturbance. The patient is not nervous/anxious.     Immunization History  Administered Date(s) Administered   Influenza,inj,Quad PF,6+ Mos 12/29/2017   Influenza-Unspecified 12/23/2013, 12/01/2014, 01/11/2016   Pneumococcal Conjugate-13 11/22/2013   Pneumococcal Polysaccharide-23 07/01/2006   Tdap 12/17/2011   Zoster 12/25/2005   Pertinent  Health Maintenance Due  Topic Date Due   FOOT EXAM  12/06/1942   OPHTHALMOLOGY EXAM  12/06/1942   URINE MICROALBUMIN  02/11/2017   HEMOGLOBIN A1C  10/05/2018   INFLUENZA VACCINE  10/31/2018   DEXA SCAN  Completed   PNA vac Low Risk Adult  Completed  Fall Risk  06/17/2018 04/22/2018 01/22/2017 07/23/2016 04/25/2015  Falls in the past year? - 0 No No No  Number falls in past yr: - 0 - - -  Injury with Fall? - 0 - - -  Risk for fall due to : Impaired vision - - - -    Vitals:   09/03/18 0855  BP: 132/80  Pulse: 80  Resp: 18  Temp: (!) 97.5 F (36.4 C)  TempSrc: Oral  SpO2: 96%  Weight: 135 lb (61.2 kg)  Height: 5' (1.524 m)   Body mass index is 26.37 kg/m. Physical Exam Vitals signs and nursing note reviewed.  Constitutional:      General: She is not in acute distress.    Appearance: She is overweight. She is not ill-appearing.  HENT:     Head: Normocephalic.      Nose: No congestion or rhinorrhea.     Mouth/Throat:     Mouth: Mucous membranes are moist.     Pharynx: No oropharyngeal exudate or posterior oropharyngeal erythema.  Eyes:     General: No scleral icterus.       Right eye: No discharge.        Left eye: No discharge.     Conjunctiva/sclera: Conjunctivae normal.     Pupils: Pupils are equal, round, and reactive to light.     Comments: Corrective lens in place   Neck:     Musculoskeletal: Normal range of motion. No neck rigidity or muscular tenderness.  Cardiovascular:     Rate and Rhythm: Normal rate and regular rhythm.     Pulses: Normal pulses.     Heart sounds: Normal heart sounds. No murmur. No friction rub. No gallop.   Pulmonary:     Effort: Pulmonary effort is normal. No respiratory distress.     Breath sounds: Normal breath sounds. No wheezing, rhonchi or rales.  Chest:     Chest wall: No tenderness.  Abdominal:     General: Bowel sounds are normal. There is no distension.     Palpations: Abdomen is soft. There is no mass.     Tenderness: There is no abdominal tenderness. There is no right CVA tenderness, left CVA tenderness or guarding.  Musculoskeletal:        General: No swelling or tenderness.     Right lower leg: No edema.     Left lower leg: No edema.     Comments: Moves x 4 extremities.Does not use any assistive device.  Lymphadenopathy:     Cervical: No cervical adenopathy.  Skin:    General: Skin is warm and dry.     Coloration: Skin is not pale.     Findings: No bruising, erythema or rash.  Neurological:     Mental Status: She is alert.     Cranial Nerves: No cranial nerve deficit.     Motor: No weakness.     Comments: Alert and orient to self.  Psychiatric:        Mood and Affect: Mood normal.        Speech: Speech normal.        Behavior: Behavior normal.        Thought Content: Thought content normal.        Cognition and Memory: Cognition is impaired. Memory is impaired.        Judgment: Judgment  normal.    Labs reviewed: Recent Labs    12/29/17 0745 04/06/18 0000 06/17/18 1118  NA 140 141 140  K  4.4 4.5 3.7  CL 102 103 107  CO2 26 30 24   GLUCOSE 107* 104* 102*  BUN 12 12 10   CREATININE 0.73 0.73 0.69  CALCIUM 9.4 9.9 8.9   Recent Labs    12/29/17 0745 04/06/18 0000  AST 20 17  ALT 11 11  BILITOT 1.0 1.3*  PROT 7.2 7.3   Recent Labs    04/06/18 0000 06/17/18 1118  WBC 8.9 7.9  NEUTROABS 4,610  --   HGB 13.3 11.5*  HCT 39.7 36.9  MCV 87.4 91.3  PLT 324 223   Lab Results  Component Value Date   TSH 4.29 12/29/2017   Lab Results  Component Value Date   HGBA1C 5.8 (H) 04/06/2018   Lab Results  Component Value Date   CHOL 275 (H) 04/06/2018   HDL 52 04/06/2018   LDLCALC 190 (H) 04/06/2018   TRIG 161 (H) 04/06/2018   CHOLHDL 5.3 (H) 04/06/2018    Significant Diagnostic Results in last 30 days:  No results found.  Assessment/Plan 1. Essential hypertension B/p reviewed stable.off medication.continue on ASA EC 81 mg tablet daily for CVD event prevention.  2. Mixed hyperlipidemia LDL 190 (04/06/18).allergic to statin.continue on Omega-3 fatty acids 1000 mg capsule daily.monitor lipid panel periodically.  3. Age-related osteoporosis without current pathological fracture Had Dexa scan 12/06/2016 showed T-score -2.6 No recent fracture or falls.continue on calcium-Vit D 500-200 mg-units tablet daily.continue to encourage oral intake.   4. Late onset Alzheimer's disease without behavioral disturbance ([place]) No new behavioral issues reported.continues to require assistance with her ADL's.Progressive decline expected due to her advance age.continue with Speech therapy for orientation,squecing,memory and executive functioning.continue on Namenda XR 28 mg capsule daily and Exelon patch daily.  Family/ staff Communication: Reviewed plan of care with patient and facility Nurse supervisor   Labs/tests ordered: None   Sandrea Hughs, NP

## 2018-09-14 ENCOUNTER — Non-Acute Institutional Stay (SKILLED_NURSING_FACILITY): Payer: Medicare Other | Admitting: Family

## 2018-09-14 ENCOUNTER — Encounter: Payer: Self-pay | Admitting: Family

## 2018-09-14 DIAGNOSIS — R399 Unspecified symptoms and signs involving the genitourinary system: Secondary | ICD-10-CM

## 2018-09-14 NOTE — Progress Notes (Signed)
Location:  [place] Room Number: [place] of Service:  SNF [phone]) Provider: Jaynia Fendley FNP-C  [name], MD  Patient Care Team: [name], MD as PCP - General (Internal Medicine) Lindwood Coke, MD as Consulting Physician (Dermatology) Christophe Louis, MD as Consulting Physician (Obstetrics and Gynecology) Latanya Maudlin, MD as Consulting Physician (Orthopedic Surgery) Garlan Fair, MD as Consulting Physician (Gastroenterology) Barnett Abu., MD as Consulting Physician (Cardiology) Neldon Mc Donnamarie Poag, MD as Consulting Physician (Allergy and Immunology) Rexene Alberts, MD as Consulting Physician (Cardiothoracic Surgery) Melida Quitter, MD as Consulting Physician (Otolaryngology) Fender Herder, Nelda Bucks, NP as Nurse Practitioner (Family Medicine)  Extended Emergency Contact Information Primary Emergency Contact: Kathie Rhodes of [place] Phone: [phone] Mobile Phone: [phone] Relation: Legal Guardian Secondary Emergency Contact: Leana Gamer [place] of [place] Phone: [phone] Relation: Uncle  Code Status:  DNR Goals of care: Advanced Directive information Advanced Directives 09/14/2018  Does Patient Have a Medical Advance Directive? Yes  Type of Advance Directive [place]  Does patient want to make changes to medical advance directive? No - Patient declined  Copy of [place] in Chart? Yes - validated most recent copy scanned in chart (See row information)  Pre-existing out of facility DNR order (yellow form or pink MOST form) -     Chief Complaint  Patient presents with  . Acute Visit    Lower abdominal /flank area pain and dark urine     HPI:  Pt is a 85 y.o. female seen today at [facility] for an acute visit for evaluation of lower abdominal /flank pain and dark urine color.She is seen in her room today per Nurse via SBAR.Nurse report that  patient complained of lower abdominal/flank pain and had dark colored urine.No fever,chills,nausea or vomiting reported.HPI limited due to patient's cognitive impairment.    Past Medical History:  Diagnosis Date  . Abnormal CT scan, chest September 2011   Scar tissue  . Allergic rhinitis   . Alzheimer's dementia ([place])   . Asthma   . Diabetes mellitus    borderline and under control-diet ,exercise  . Esophageal stricture   . Family history of colon cancer    Mother  . GERD (gastroesophageal reflux disease)   . Hiatal hernia   . HLD (hyperlipidemia)   . Hypertension   . Osteopenia   . Pneumonia 12-15 yrs.ago   yrs. ago  . TIA (transient ischemic attack)   . Vitamin D deficiency    Past Surgical History:  Procedure Laterality Date  . ABDOMINAL HYSTERECTOMY  1980   . CATARACT EXTRACTION Left 2014  . SHOULDER OPEN ROTATOR CUFF REPAIR  05/22/2011   Procedure: ROTATOR CUFF REPAIR SHOULDER OPEN;  Surgeon: Tobi Bastos, MD;  Location: [facility];  Service: Orthopedics;  Laterality: Left;  . THORACOTOMY / DECORTICATION PARIETAL PLEURA Right 204   empyema  . TONSILLECTOMY      Allergies  Allergen Reactions  . Aricept [Donepezil Hcl] Diarrhea  . Crestor [Rosuvastatin Calcium]     Myalgia  . Lipitor [Atorvastatin Calcium]   . Lovastatin     Myalgia  . Micardis [Telmisartan]     Fatigue  . Sertraline Diarrhea  . Welchol [Colesevelam Hcl]     Constipation  . Zetia [Ezetimibe]     Myalgia  . Zocor [Simvastatin]     Myalgia    Outpatient Encounter Medications as of 09/14/2018  Medication Sig  .  alendronate (FOSAMAX) 70 MG tablet TAKE 1 TAB ONCE A WEEK, AT LEAST 30 MIN BEFORE 1ST FOOD.DO NOT LIE DOWN FOR 30 MIN AFTER TAKING.  Mar[place] Kitchen aspirin EC 81 MG tablet Take 1 tablet (81 mg total) by mouth daily.  . calcium-vitamin D (OSCAL WITH D) 500-200 MG-UNIT tablet Take 1 tablet by mouth daily with breakfast.  . cetirizine (ZYRTEC) 10 MG tablet Take 10 mg by mouth at bedtime.   .  memantine (NAMENDA XR) 28 MG CP24 24 hr capsule TAKE ONE CAPSULE ONCE A DAY TO PRESERVE MEMORY.  . Omega-3 Fatty Acids (FISH OIL OMEGA-3) 1000 MG CAPS Take 1 capsule by mouth daily.  . rivastigmine (EXELON) 9.5 mg/24hr Apply fresh patch daily and remove old patch to help preserve memory  . zinc oxide 20 % ointment Apply 1 application topically as needed for irritation. Apply to peri/buttocks topical   No facility-administered encounter medications on file as of 09/14/2018.     Review of Systems  Unable to perform ROS: Dementia (additional information provided by facility nurse)    Immunization History  Administered Date(s) Administered  . Influenza,inj,Quad PF,6+ Mos 12/29/2017  . Influenza-Unspecified 12/23/2013, 12/01/2014, 01/11/2016  . Pneumococcal Conjugate-13 11/22/2013  . Pneumococcal Polysaccharide-23 07/01/2006  . Tdap 12/17/2011  . Zoster 12/25/2005   Pertinent  Health Maintenance Due  Topic Date Due  . FOOT EXAM  12/06/1942  . OPHTHALMOLOGY EXAM  12/06/1942  . URINE MICROALBUMIN  02/11/2017  . HEMOGLOBIN A1C  10/05/2018  . INFLUENZA VACCINE  10/31/2018  . DEXA SCAN  Completed  . PNA vac Low Risk Adult  Completed   Fall Risk  06/17/2018 04/22/2018 01/22/2017 07/23/2016 04/25/2015  Falls in the past year? - 0 No No No  Number falls in past yr: - 0 - - -  Injury with Fall? - 0 - - -  Risk for fall due to : Impaired vision - - - -    Vitals:   09/14/18 1145  BP: 124/70  Pulse: 84  Resp: 20  Temp: (!) 96.9 F (36.1 C)  TempSrc: Oral  SpO2: 92%  Weight: 135 lb (61.2 kg)  Height: 5' (1.524 m)   Body mass index is 26.37 kg/m. Physical Exam Constitutional:      General: She is not in acute distress.    Appearance: She is overweight. She is not ill-appearing.  HENT:     Head: Normocephalic.     Mouth/Throat:     Mouth: Mucous membranes are moist.     Pharynx: Oropharynx is clear. No oropharyngeal exudate or posterior oropharyngeal erythema.  Eyes:      General: No scleral icterus.       Right eye: No discharge.        Left eye: No discharge.     Conjunctiva/sclera: Conjunctivae normal.     Pupils: Pupils are equal, round, and reactive to light.  Cardiovascular:     Rate and Rhythm: Normal rate and regular rhythm.     Pulses: Normal pulses.     Heart sounds: Normal heart sounds. No murmur. No friction rub. No gallop.   Pulmonary:     Effort: Pulmonary effort is normal. No respiratory distress.     Breath sounds: Normal breath sounds. No wheezing, rhonchi or rales.  Chest:     Chest wall: No tenderness.  Abdominal:     General: Bowel sounds are normal. There is no distension.     Palpations: Abdomen is soft. There is no mass.  Tenderness: There is no abdominal tenderness. There is no right CVA tenderness, left CVA tenderness, guarding or rebound.  Musculoskeletal: Normal range of motion.        General: No swelling or tenderness.     Right lower leg: No edema.     Left lower leg: No edema.  Skin:    General: Skin is warm and dry.     Coloration: Skin is not pale.     Findings: No erythema.  Neurological:     Mental Status: She is alert. Mental status is at baseline.     Sensory: No sensory deficit.     Motor: No weakness.  Psychiatric:        Mood and Affect: Mood normal.        Speech: Speech normal.        Behavior: Behavior is cooperative.        Thought Content: Thought content normal.        Cognition and Memory: Memory is impaired.        Judgment: Judgment normal.    Labs reviewed: Recent Labs    12/29/17 0745 04/06/18 0000 06/17/18 1118  NA 140 141 140  K 4.4 4.5 3.7  CL 102 103 107  CO2 26 30 24   GLUCOSE 107* 104* 102*  BUN 12 12 10   CREATININE 0.73 0.73 0.69  CALCIUM 9.4 9.9 8.9   Recent Labs    12/29/17 0745 04/06/18 0000  AST 20 17  ALT 11 11  BILITOT 1.0 1.3*  PROT 7.2 7.3   Recent Labs    04/06/18 0000 06/17/18 1118  WBC 8.9 7.9  NEUTROABS 4,610  --   HGB 13.3 11.5*  HCT 39.7 36.9   MCV 87.4 91.3  PLT 324 223   Lab Results  Component Value Date   TSH 4.29 12/29/2017   Lab Results  Component Value Date   HGBA1C 5.8 (H) 04/06/2018   Lab Results  Component Value Date   CHOL 275 (H) 04/06/2018   HDL 52 04/06/2018   LDLCALC 190 (H) 04/06/2018   TRIG 161 (H) 04/06/2018   CHOLHDL 5.3 (H) 04/06/2018    Significant Diagnostic Results in last 30 days:  No results found.  Assessment/Plan  Symptoms of urinary tract infection Afebrile.Reported lower abdominal/flank pain.Nontender on palpation.confused at her baseline.Encouraged to increase fluid intake.will obtain urine specimen for U/A and C/S to rule out UTI.  Family/ staff Communication: Reviewed plan of care with patient and facility Nurse   Labs/tests ordered: urine specimen for U/A and C/S to rule out UTI.  Sandrea Hughs, NP

## 2018-09-17 ENCOUNTER — Emergency Department (HOSPITAL_COMMUNITY): Payer: Medicare Other

## 2018-09-17 ENCOUNTER — Encounter (HOSPITAL_COMMUNITY): Payer: Self-pay | Admitting: Emergency Medicine

## 2018-09-17 ENCOUNTER — Other Ambulatory Visit: Payer: Self-pay

## 2018-09-17 ENCOUNTER — Encounter: Payer: Self-pay | Admitting: Internal Medicine

## 2018-09-17 ENCOUNTER — Inpatient Hospital Stay (HOSPITAL_COMMUNITY)
Admission: EM | Admit: 2018-09-17 | Discharge: 2018-09-28 | DRG: 177 | Disposition: A | Discharge status: Skilled Nursing Facility | Payer: Medicare Other | Attending: Internal Medicine | Admitting: Internal Medicine

## 2018-09-17 ENCOUNTER — Non-Acute Institutional Stay (SKILLED_NURSING_FACILITY): Payer: Medicare Other | Admitting: Internal Medicine

## 2018-09-17 DIAGNOSIS — J90 Pleural effusion, not elsewhere classified: Secondary | ICD-10-CM | POA: Diagnosis present

## 2018-09-17 DIAGNOSIS — Z90710 Acquired absence of both cervix and uterus: Secondary | ICD-10-CM

## 2018-09-17 DIAGNOSIS — G301 Alzheimer's disease with late onset: Secondary | ICD-10-CM | POA: Diagnosis not present

## 2018-09-17 DIAGNOSIS — Z87891 Personal history of nicotine dependence: Secondary | ICD-10-CM

## 2018-09-17 DIAGNOSIS — Z20822 Contact with and (suspected) exposure to covid-19: Secondary | ICD-10-CM | POA: Insufficient documentation

## 2018-09-17 DIAGNOSIS — F0280 Dementia in other diseases classified elsewhere without behavioral disturbance: Secondary | ICD-10-CM | POA: Diagnosis not present

## 2018-09-17 DIAGNOSIS — Z66 Do not resuscitate: Secondary | ICD-10-CM | POA: Diagnosis present

## 2018-09-17 DIAGNOSIS — E119 Type 2 diabetes mellitus without complications: Secondary | ICD-10-CM | POA: Diagnosis not present

## 2018-09-17 DIAGNOSIS — Z7982 Long term (current) use of aspirin: Secondary | ICD-10-CM

## 2018-09-17 DIAGNOSIS — R1310 Dysphagia, unspecified: Secondary | ICD-10-CM | POA: Diagnosis not present

## 2018-09-17 DIAGNOSIS — G309 Alzheimer's disease, unspecified: Secondary | ICD-10-CM | POA: Diagnosis present

## 2018-09-17 DIAGNOSIS — D72829 Elevated white blood cell count, unspecified: Secondary | ICD-10-CM | POA: Diagnosis not present

## 2018-09-17 DIAGNOSIS — Z20828 Contact with and (suspected) exposure to other viral communicable diseases: Secondary | ICD-10-CM | POA: Diagnosis present

## 2018-09-17 DIAGNOSIS — J45909 Unspecified asthma, uncomplicated: Secondary | ICD-10-CM | POA: Diagnosis not present

## 2018-09-17 DIAGNOSIS — I10 Essential (primary) hypertension: Secondary | ICD-10-CM

## 2018-09-17 DIAGNOSIS — M8580 Other specified disorders of bone density and structure, unspecified site: Secondary | ICD-10-CM | POA: Diagnosis present

## 2018-09-17 DIAGNOSIS — E876 Hypokalemia: Secondary | ICD-10-CM | POA: Diagnosis present

## 2018-09-17 DIAGNOSIS — K219 Gastro-esophageal reflux disease without esophagitis: Secondary | ICD-10-CM | POA: Diagnosis present

## 2018-09-17 DIAGNOSIS — Z8673 Personal history of transient ischemic attack (TIA), and cerebral infarction without residual deficits: Secondary | ICD-10-CM | POA: Diagnosis not present

## 2018-09-17 DIAGNOSIS — D473 Essential (hemorrhagic) thrombocythemia: Secondary | ICD-10-CM | POA: Diagnosis not present

## 2018-09-17 DIAGNOSIS — J181 Lobar pneumonia, unspecified organism: Secondary | ICD-10-CM

## 2018-09-17 DIAGNOSIS — R109 Unspecified abdominal pain: Secondary | ICD-10-CM

## 2018-09-17 DIAGNOSIS — R933 Abnormal findings on diagnostic imaging of other parts of digestive tract: Secondary | ICD-10-CM | POA: Diagnosis not present

## 2018-09-17 DIAGNOSIS — D75839 Thrombocytosis, unspecified: Secondary | ICD-10-CM

## 2018-09-17 DIAGNOSIS — K449 Diaphragmatic hernia without obstruction or gangrene: Secondary | ICD-10-CM | POA: Diagnosis not present

## 2018-09-17 DIAGNOSIS — J690 Pneumonitis due to inhalation of food and vomit: Secondary | ICD-10-CM | POA: Diagnosis not present

## 2018-09-17 DIAGNOSIS — J189 Pneumonia, unspecified organism: Secondary | ICD-10-CM | POA: Diagnosis present

## 2018-09-17 DIAGNOSIS — R627 Adult failure to thrive: Secondary | ICD-10-CM | POA: Diagnosis present

## 2018-09-17 DIAGNOSIS — E871 Hypo-osmolality and hyponatremia: Secondary | ICD-10-CM | POA: Diagnosis not present

## 2018-09-17 DIAGNOSIS — J9601 Acute respiratory failure with hypoxia: Secondary | ICD-10-CM

## 2018-09-17 DIAGNOSIS — Z79899 Other long term (current) drug therapy: Secondary | ICD-10-CM | POA: Diagnosis not present

## 2018-09-17 DIAGNOSIS — D649 Anemia, unspecified: Secondary | ICD-10-CM | POA: Diagnosis present

## 2018-09-17 DIAGNOSIS — F0150 Vascular dementia without behavioral disturbance: Secondary | ICD-10-CM | POA: Diagnosis present

## 2018-09-17 DIAGNOSIS — E785 Hyperlipidemia, unspecified: Secondary | ICD-10-CM | POA: Diagnosis present

## 2018-09-17 DIAGNOSIS — K222 Esophageal obstruction: Secondary | ICD-10-CM | POA: Diagnosis not present

## 2018-09-17 LAB — BASIC METABOLIC PANEL
BUN: 7 (ref 4–21)
Creatinine: 0.5 (ref 0.5–1.1)
Glucose: 157
Potassium: 3.7 (ref 3.4–5.3)
Sodium: 133 — AB (ref 137–147)

## 2018-09-17 LAB — COMPREHENSIVE METABOLIC PANEL
ALT: 29 U/L (ref 0–44)
AST: 48 U/L — ABNORMAL HIGH (ref 15–41)
Albumin: 2.7 g/dL — ABNORMAL LOW (ref 3.5–5.0)
Alkaline Phosphatase: 168 U/L — ABNORMAL HIGH (ref 38–126)
Anion gap: 12 (ref 5–15)
BUN: 9 mg/dL (ref 8–23)
CO2: 27 mmol/L (ref 22–32)
Calcium: 8.7 mg/dL — ABNORMAL LOW (ref 8.9–10.3)
Chloride: 95 mmol/L — ABNORMAL LOW (ref 98–111)
Creatinine, Ser: 0.46 mg/dL (ref 0.44–1.00)
GFR calc Af Amer: >60 mL/min (ref >60)
GFR calc non Af Amer: >60 mL/min (ref >60)
Glucose, Bld: 151 mg/dL — ABNORMAL HIGH (ref 70–99)
Potassium: 3.4 mmol/L — ABNORMAL LOW (ref 3.5–5.1)
Sodium: 134 mmol/L — ABNORMAL LOW (ref 135–145)
Total Bilirubin: 1.3 mg/dL — ABNORMAL HIGH (ref 0.3–1.2)
Total Protein: 8 g/dL (ref 6.5–8.1)

## 2018-09-17 LAB — CBC WITH DIFFERENTIAL/PLATELET
Abs Immature Granulocytes: 0.77 10*3/uL — ABNORMAL HIGH (ref 0.00–0.07)
Basophils Absolute: 0.1 10*3/uL (ref 0.0–0.1)
Basophils Relative: 0 %
Eosinophils Absolute: 0.2 10*3/uL (ref 0.0–0.5)
Eosinophils Relative: 1 %
HCT: 34.6 % — ABNORMAL LOW (ref 36.0–46.0)
Hemoglobin: 11 g/dL — ABNORMAL LOW (ref 12.0–15.0)
Immature Granulocytes: 3 %
Lymphocytes Relative: 6 %
Lymphs Abs: 1.5 10*3/uL (ref 0.7–4.0)
MCH: 28.4 pg (ref 26.0–34.0)
MCHC: 31.8 g/dL (ref 30.0–36.0)
MCV: 89.2 fL (ref 80.0–100.0)
Monocytes Absolute: 1.9 10*3/uL — ABNORMAL HIGH (ref 0.1–1.0)
Monocytes Relative: 7 %
Neutro Abs: 21.1 10*3/uL — ABNORMAL HIGH (ref 1.7–7.7)
Neutrophils Relative %: 83 %
Platelets: 466 10*3/uL — ABNORMAL HIGH (ref 150–400)
RBC: 3.88 MIL/uL (ref 3.87–5.11)
RDW: 16.9 % — ABNORMAL HIGH (ref 11.5–15.5)
WBC: 25.6 10*3/uL — ABNORMAL HIGH (ref 4.0–10.5)
nRBC: 0.1 % (ref 0.0–0.2)

## 2018-09-17 LAB — HEPATIC FUNCTION PANEL
ALT: 24 (ref 7–35)
AST: 43 — AB (ref 13–35)

## 2018-09-17 LAB — SARS CORONAVIRUS 2 BY RT PCR (HOSPITAL ORDER, PERFORMED IN ~~LOC~~ HOSPITAL LAB): SARS Coronavirus 2: NEGATIVE

## 2018-09-17 LAB — LACTIC ACID, PLASMA: Lactic Acid, Venous: 2.1 mmol/L (ref 0.5–1.9)

## 2018-09-17 MED ORDER — SODIUM CHLORIDE 0.9 % IV SOLN
2.0000 g | INTRAVENOUS | Status: DC
  Administered 2018-09-17: 2 g via INTRAVENOUS
  Filled 2018-09-17: qty 20

## 2018-09-17 MED ORDER — VANCOMYCIN HCL 10 G IV SOLR
1250.0000 mg | Freq: Once | INTRAVENOUS | Status: AC
  Administered 2018-09-18: 1250 mg via INTRAVENOUS
  Filled 2018-09-17: qty 1250

## 2018-09-17 MED ORDER — PIPERACILLIN-TAZOBACTAM 3.375 G IVPB 30 MIN
3.3750 g | Freq: Once | INTRAVENOUS | Status: AC
  Administered 2018-09-18: 3.375 g via INTRAVENOUS
  Filled 2018-09-17: qty 50

## 2018-09-17 MED ORDER — SODIUM CHLORIDE 0.9 % IV SOLN
1000.0000 mL | INTRAVENOUS | Status: DC
  Administered 2018-09-17: 1000 mL via INTRAVENOUS

## 2018-09-17 MED ORDER — SODIUM CHLORIDE 0.9 % IV SOLN
500.0000 mg | INTRAVENOUS | Status: DC
  Administered 2018-09-17: 500 mg via INTRAVENOUS
  Filled 2018-09-17: qty 500

## 2018-09-17 NOTE — ED Notes (Signed)
Bed: WA13 Expected date:  Expected time:  Means of arrival:  Comments: EMS-PNA 

## 2018-09-17 NOTE — ED Provider Notes (Signed)
[facility] DEPT Provider Note   CSN: [id number] Arrival date & time: 09/17/18  1858     History   Chief Complaint Chief Complaint  Patient presents with  . Fever  . Shortness of Breath  . Cough    HPI Grace Gilbert is a 85 y.o. female.     This is a 85 year old female with a history of dementia presented from nursing home with complaints of fever, cough, abnormal labs and chest x-ray.  Review of the records from the facility show that she had a chest x-ray today which showed a pneumonia.  She also has a significant leukocytosis of 24,000.  Was sent here for further management.  No treatment started prior to arrival.  Reportedly has been sick for several days.  Also according to the records from facility patient had negative COVID test 2 days ago.     Past Medical History:  Diagnosis Date  . Abnormal CT scan, chest September 2011   Scar tissue  . Allergic rhinitis   . Alzheimer's dementia ([place])   . Asthma   . Diabetes mellitus    borderline and under control-diet ,exercise  . Esophageal stricture   . Family history of colon cancer    Mother  . GERD (gastroesophageal reflux disease)   . Hiatal hernia   . HLD (hyperlipidemia)   . Hypertension   . Osteopenia   . Pneumonia 12-15 yrs.ago   yrs. ago  . TIA (transient ischemic attack)   . Vitamin D deficiency     Patient Active Problem List   Diagnosis Date Noted  . HCAP (healthcare-associated pneumonia) 09/17/2018  . Exposure to Covid-19 Virus 09/17/2018  . Symptoms of urinary tract infection 09/14/2018  . Allergic rhinitis 06/02/2017  . History of TIA (transient ischemic attack) 03/05/2017  . History of rotator cuff tear 03/05/2017  . Age-related osteoporosis without current pathological fracture 12/09/2016  . Prediabetes 11/20/2016  . Hypertension   . Alzheimer's dementia ([place])   . HLD (hyperlipidemia)   . Esophageal stricture     Past Surgical History:  Procedure  Laterality Date  . ABDOMINAL HYSTERECTOMY  1980   . CATARACT EXTRACTION Left 2014  . SHOULDER OPEN ROTATOR CUFF REPAIR  05/22/2011   Procedure: ROTATOR CUFF REPAIR SHOULDER OPEN;  Surgeon: Tobi Bastos, MD;  Location: [facility];  Service: Orthopedics;  Laterality: Left;  . THORACOTOMY / DECORTICATION PARIETAL PLEURA Right 204   empyema  . TONSILLECTOMY       OB History   No obstetric history on file.      Home Medications    Prior to Admission medications   Medication Sig Start Date End Date Taking? Authorizing Provider  alendronate (FOSAMAX) 70 MG tablet TAKE 1 TAB ONCE A WEEK, AT LEAST 30 MIN BEFORE 1ST FOOD.DO NOT LIE DOWN FOR 30 MIN AFTER TAKING. 02/03/18   Ngetich, Dinah C, NP  aspirin EC 81 MG tablet Take 1 tablet (81 mg total) by mouth daily. 03/05/17   Blanchie Serve, MD  calcium-vitamin D (OSCAL WITH D) 500-200 MG-UNIT tablet Take 1 tablet by mouth daily with breakfast.    [provider]  cetirizine (ZYRTEC) 10 MG tablet Take 10 mg by mouth at bedtime.     [provider]  memantine (NAMENDA XR) 28 MG CP24 24 hr capsule TAKE ONE CAPSULE ONCE A DAY TO PRESERVE MEMORY. 02/03/18   Ngetich, Dinah C, NP  Omega-3 Fatty Acids (FISH OIL OMEGA-3) 1000 MG CAPS Take 1 capsule  by mouth daily. 01/06/18   Ngetich, Dinah C, NP  rivastigmine (EXELON) 9.5 mg/24hr Apply fresh patch daily and remove old patch to help preserve memory 10/07/17   Blanchie Serve, MD  zinc oxide 20 % ointment Apply 1 application topically as needed for irritation. Apply to peri/buttocks topical    [provider]    Family History History reviewed. No pertinent family history.  Social History Social History   Tobacco Use  . Smoking status: Former Smoker    Packs/day: 1.50    Years: 20.00    Pack years: 30.00    Types: Cigarettes    Quit date: 05/15/1987    Years since quitting: 31.3  . Smokeless tobacco: Never Used  Substance Use Topics  . Alcohol use: No    Alcohol/week: 1.0  standard drinks    Types: 1 Glasses of wine per week  . Drug use: No     Allergies   Aricept [donepezil hcl], Crestor [rosuvastatin calcium], Lipitor [atorvastatin calcium], Lovastatin, Micardis [telmisartan], Sertraline, Welchol [colesevelam hcl], Zetia [ezetimibe], and Zocor [simvastatin]   Review of Systems Review of Systems  Unable to perform ROS: Dementia     Physical Exam Updated Vital Signs There were no vitals taken for this visit.  Physical Exam Vitals signs and nursing note reviewed.  Constitutional:      General: She is not in acute distress.    Appearance: Normal appearance. She is well-developed. She is not toxic-appearing.  HENT:     Head: Normocephalic and atraumatic.  Eyes:     General: Lids are normal.     Conjunctiva/sclera: Conjunctivae normal.     Pupils: Pupils are equal, round, and reactive to light.  Neck:     Musculoskeletal: Normal range of motion and neck supple.     Thyroid: No thyroid mass.     Trachea: No tracheal deviation.  Cardiovascular:     Rate and Rhythm: Normal rate and regular rhythm.     Heart sounds: Normal heart sounds. No murmur. No gallop.   Pulmonary:     Effort: Pulmonary effort is normal. No respiratory distress.     Breath sounds: Normal breath sounds. No stridor. No decreased breath sounds, wheezing, rhonchi or rales.  Abdominal:     General: Bowel sounds are normal. There is no distension.     Palpations: Abdomen is soft.     Tenderness: There is no abdominal tenderness. There is no rebound.  Musculoskeletal: Normal range of motion.        General: No tenderness.  Skin:    General: Skin is warm and dry.     Findings: No abrasion or rash.  Neurological:     Mental Status: She is alert. Mental status is at baseline. She is confused.     GCS: GCS eye subscore is 4. GCS verbal subscore is 5. GCS motor subscore is 6.     Cranial Nerves: No cranial nerve deficit.     Sensory: No sensory deficit.     Motor: Tremor  present.     Comments: Patient moves all 4 extremities.  Psychiatric:        Attention and Perception: She is inattentive.        Speech: Speech is delayed and tangential.      ED Treatments / Results  Labs (all labs ordered are listed, but only abnormal results are displayed) Labs Reviewed - No data to display  EKG EKG Interpretation  Date/Time:  Thursday September 17 2018 19:46:31 EDT Ventricular  Rate:  80 PR Interval:    QRS Duration: 98 QT Interval:  390 QTC Calculation: 450 R Axis:   -40 Text Interpretation:  Sinus rhythm Left anterior fascicular block Abnormal R-wave progression, early transition Probable left ventricular hypertrophy No significant change since last tracing Confirmed by Lacretia Leigh ([id number]) on 09/17/2018 9:40:02 PM   Radiology No results found.  Procedures Procedures (including critical care time)  Medications Ordered in ED Medications - No data to display   Initial Impression / Assessment and Plan / ED Course  I have reviewed the triage vital signs and the nursing notes.  Pertinent labs & imaging results that were available during my care of the patient were reviewed by me and considered in my medical decision making (see chart for details).        Started on IV antibiotics as her chest x-ray shows pneumonia.  She is COVID test is negative.  Lactate is elevated here.  Patient has evidence of leukocytosis.  Patient to be admitted to the hospital  Final Clinical Impressions(s) / ED Diagnoses   Final diagnoses:  None    ED Discharge Orders    None       Lacretia Leigh, MD 09/17/18 2250

## 2018-09-17 NOTE — ED Triage Notes (Signed)
Patient arrived by EMS from [facility] Pt c/o cough, SOB, and fever.   Pt was diagnosed w/ Pneumonia and Tested for Covid on Tuesday. Patient had negative COVID Test at Facility. Facility WBC test showed count of 24.3 per EMS.   Alert and Oriented x 1 per EMS.    Hx of DM, Dementia per EMS.   EMS VS  CBG 180, BP 118/70, SpO2 98% on 2L , T 99.3 F, RR 20.

## 2018-09-17 NOTE — H&P (Signed)
History and Physical   [facility] Admission History and Physical [employer], D.O.    Patient Name: Grace Gilbert MR#: [id number] Date of Birth: 05-19-1932 Date of Admission: 09/17/2018  Referring MD/NP/PA: Dr. Zenia Resides Primary Care Physician: [name], MD  Chief Complaint:  Chief Complaint  Patient presents with  . Fever  . Shortness of Breath  . Cough  Please note the entire history is obtained from the patient's emergency department chart, emergency department provider, and prior records. Patient's personal history is limited by dementia.   HPI: Grace Gilbert is a 85 y.o. female with a known history of Alzheimer's dementia, hypertension, hyperlipidemia, GERD, diabetes presents to the emergency department for evaluation of fever, cough, shortness of breath.  Patient was sent to the emergency department by her nursing home because she had developed symptoms of fever, shortness of breath and cough.  Chest x-ray done today was read as pneumonia.  Her white blood cell count was 24,000.  Of note she was tested negative for COVID 2 days ago at her facility.   EMS/ED Course: Patient received Rocephin and a azithromycin. Medical admission has been requested for further management of pneumonia.  Review of Systems:  Unable to obtain secondary to dementia   Past Medical History:  Diagnosis Date  . Abnormal CT scan, chest September 2011   Scar tissue  . Allergic rhinitis   . Alzheimer's dementia ([place])   . Asthma   . Diabetes mellitus    borderline and under control-diet ,exercise  . Esophageal stricture   . Family history of colon cancer    Mother  . GERD (gastroesophageal reflux disease)   . Hiatal hernia   . HLD (hyperlipidemia)   . Hypertension   . Osteopenia   . Pneumonia 12-15 yrs.ago   yrs. ago  . TIA (transient ischemic attack)   . Vitamin D deficiency     Past Surgical History:  Procedure Laterality Date  . ABDOMINAL  HYSTERECTOMY  1980   . CATARACT EXTRACTION Left 2014  . SHOULDER OPEN ROTATOR CUFF REPAIR  05/22/2011   Procedure: ROTATOR CUFF REPAIR SHOULDER OPEN;  Surgeon: Tobi Bastos, MD;  Location: [facility];  Service: Orthopedics;  Laterality: Left;  . THORACOTOMY / DECORTICATION PARIETAL PLEURA Right 204   empyema  . TONSILLECTOMY       reports that she quit smoking about 31 years ago. Her smoking use included cigarettes. She has a 30.00 pack-year smoking history. She has never used smokeless tobacco. She reports that she does not drink alcohol or use drugs.  Allergies  Allergen Reactions  . Aricept [Donepezil Hcl] Diarrhea  . Crestor [Rosuvastatin Calcium]     Myalgia  . Lipitor [Atorvastatin Calcium]   . Lovastatin     Myalgia  . Micardis [Telmisartan]     Fatigue  . Sertraline Diarrhea  . Welchol [Colesevelam Hcl]     Constipation  . Zetia [Ezetimibe]     Myalgia  . Zocor [Simvastatin]     Myalgia    Family history: unable to obtain  Prior to Admission medications   Medication Sig Start Date End Date Taking? Authorizing Provider  acetaminophen (TYLENOL) 325 MG tablet Take 650 mg by mouth every 6 (six) hours as needed for headache.   Yes [provider]  alendronate (FOSAMAX) 70 MG tablet TAKE 1 TAB ONCE A WEEK, AT LEAST 30 MIN BEFORE 1ST FOOD.DO NOT LIE DOWN FOR 30 MIN AFTER TAKING. Patient taking differently: Take  70 mg by mouth once a week. Take 70mg  30 mins before 1st meal of the day. Do not lie down for 30 mins after taking. 02/03/18  Yes Ngetich, Dinah C, NP  aspirin EC 81 MG tablet Take 1 tablet (81 mg total) by mouth daily. 03/05/17  Yes Blanchie Serve, MD  calcium-vitamin D (OSCAL WITH D) 500-200 MG-UNIT tablet Take 1 tablet by mouth daily with breakfast.   Yes [provider]  cetirizine (ZYRTEC) 10 MG tablet Take 10 mg by mouth at bedtime.    Yes [provider]  memantine (NAMENDA XR) 28 MG CP24 24 hr capsule TAKE ONE CAPSULE ONCE A DAY TO  PRESERVE MEMORY. Patient taking differently: Take 28 mg by mouth daily.  02/03/18  Yes Ngetich, Dinah C, NP  Omega-3 Fatty Acids (FISH OIL OMEGA-3) 1000 MG CAPS Take 1 capsule by mouth daily. 01/06/18  Yes Ngetich, Dinah C, NP  rivastigmine (EXELON) 9.5 mg/24hr Apply fresh patch daily and remove old patch to help preserve memory 10/07/17  Yes Blanchie Serve, MD    Physical Exam: Vitals:   09/17/18 2100 09/17/18 2130 09/17/18 2230 09/17/18 2300  BP: 127/67 115/79 (!) 108/92 (!) 123/56  Pulse: 86 82 81 85  Resp: (!) 31 17 (!) 27 (!) 34  Temp:      TempSrc:      SpO2: 99% 98% (!) 87% 91%    GENERAL: 85 y.o.-year-old female patient, well-developed, well-nourished. Pleasantly confused. HEENT: Head atraumatic, normocephalic. Pupils equal. Mucus membranes moist. NECK: Supple. No JVD. CHEST: Rhonchi at left base. . No use of accessory muscles of respiration.  No reproducible chest wall tenderness.  CARDIOVASCULAR: S1, S2 normal. No murmurs, rubs, or gallops. Cap refill <2 seconds. Pulses intact distally.  ABDOMEN: Soft, nondistended, nontender. No rebound, guarding, rigidity. Normoactive bowel sounds present in all four quadrants.  EXTREMITIES: No pedal edema, cyanosis, or clubbing. No calf tenderness or Homan's sign.  NEUROLOGIC: The patient is arousable, responds to commands.  A&O x 1 SKIN: Warm, dry, and intact without obvious rash, lesion, or ulcer.    Labs on Admission:  CBC: Recent Labs  Lab 09/17/18 1934  WBC 25.6*  NEUTROABS 21.1*  HGB 11.0*  HCT 34.6*  MCV 89.2  PLT 102*   Basic Metabolic Panel: Recent Labs  Lab 09/17/18 1934  NA 134*  K 3.4*  CL 95*  CO2 27  GLUCOSE 151*  BUN 9  CREATININE 0.46  CALCIUM 8.7*   GFR: Estimated Creatinine Clearance: 43.6 mL/min (by C-G formula based on SCr of 0.46 mg/dL). Liver Function Tests: Recent Labs  Lab 09/17/18 1934  AST 48*  ALT 29  ALKPHOS 168*  BILITOT 1.3*  PROT 8.0  ALBUMIN 2.7*   No results for input(s):  LIPASE, AMYLASE in the last 168 hours. No results for input(s): AMMONIA in the last 168 hours. Coagulation Profile: No results for input(s): INR, PROTIME in the last 168 hours. Cardiac Enzymes: No results for input(s): CKTOTAL, CKMB, CKMBINDEX, TROPONINI in the last 168 hours. BNP (last 3 results) No results for input(s): PROBNP in the last 8760 hours. HbA1C: No results for input(s): HGBA1C in the last 72 hours. CBG: No results for input(s): GLUCAP in the last 168 hours. Lipid Profile: No results for input(s): CHOL, HDL, LDLCALC, TRIG, CHOLHDL, LDLDIRECT in the last 72 hours. Thyroid Function Tests: No results for input(s): TSH, T4TOTAL, FREET4, T3FREE, THYROIDAB in the last 72 hours. Anemia Panel: No results for input(s): VITAMINB12, FOLATE, FERRITIN, TIBC, IRON, RETICCTPCT in  the last 72 hours. Urine analysis:    Component Value Date/Time   COLORURINE YELLOW 06/17/2018 1316   APPEARANCEUR HAZY (A) 06/17/2018 1316   LABSPEC 1.006 06/17/2018 1316   PHURINE 7.0 06/17/2018 1316   GLUCOSEU NEGATIVE 06/17/2018 1316   HGBUR SMALL (A) 06/17/2018 1316   BILIRUBINUR NEGATIVE 06/17/2018 1316   KETONESUR NEGATIVE 06/17/2018 1316   PROTEINUR NEGATIVE 06/17/2018 1316   UROBILINOGEN 0.2 05/15/2011 1358   NITRITE NEGATIVE 06/17/2018 1316   LEUKOCYTESUR LARGE (A) 06/17/2018 1316   Sepsis Labs: @LABRCNTIP (procalcitonin:4,lacticidven:4) ) Recent Results (from the past 240 hour(s))  Blood Culture (routine x 2)     Status: None (Preliminary result)   Collection Time: 09/17/18  7:34 PM   Specimen: BLOOD LEFT WRIST  Result Value Ref Range Status   Specimen Description   Final    BLOOD LEFT WRIST Performed at [facility] [address] [place], [place] [address]    Special Requests   Final    BOTTLES DRAWN AEROBIC AND ANAEROBIC Blood Culture results may not be optimal due to an inadequate volume of blood received in culture bottles Performed at [facility] [address], [place], [place] [address]    Culture PENDING  Incomplete   Report Status PENDING  Incomplete  SARS Coronavirus 2 (CEPHEID- Performed in [facility] hospital lab), Hosp Order     Status: None   Collection Time: 09/17/18  7:35 PM   Specimen: Nasopharyngeal Swab  Result Value Ref Range Status   SARS Coronavirus 2 NEGATIVE NEGATIVE Final    Comment: (NOTE) If result is NEGATIVE SARS-CoV-2 target nucleic acids are NOT DETECTED. The SARS-CoV-2 RNA is generally detectable in upper and lower  respiratory specimens during the acute phase of infection. The lowest  concentration of SARS-CoV-2 viral copies this assay can detect is 250  copies / mL. A negative result does not preclude SARS-CoV-2 infection  and should not be used as the sole basis for treatment or other  patient management decisions.  A negative result may occur with  improper specimen collection / handling, submission of specimen other  than nasopharyngeal swab, presence of viral mutation(s) within the  areas targeted by this assay, and inadequate number of viral copies  (<250 copies / mL). A negative result must be combined with clinical  observations, patient history, and epidemiological information. If result is POSITIVE SARS-CoV-2 target nucleic acids are DETECTED. The SARS-CoV-2 RNA is generally detectable in upper and lower  respiratory specimens dur ing the acute phase of infection.  Positive  results are indicative of active infection with SARS-CoV-2.  Clinical  correlation with patient history and other diagnostic information is  necessary to determine patient infection status.  Positive results do  not rule out bacterial infection or co-infection with other viruses. If result is PRESUMPTIVE POSTIVE SARS-CoV-2 nucleic acids MAY BE PRESENT.   A presumptive positive result was obtained on the submitted specimen  and confirmed on repeat testing.  While 2019 novel coronavirus  (SARS-CoV-2) nucleic acids may  be present in the submitted sample  additional confirmatory testing may be necessary for epidemiological  and / or clinical management purposes  to differentiate between  SARS-CoV-2 and other Sarbecovirus currently known to infect humans.  If clinically indicated additional testing with an alternate test  methodology [phone]) is advised. The SARS-CoV-2 RNA is generally  detectable in upper and lower respiratory sp ecimens during the acute  phase of infection. The expected result is Negative. Fact Sheet  for Patients:  StrictlyIdeas.no Fact Sheet for Healthcare Providers: BankingDealers.co.za This test is not yet approved or cleared by the [place] FDA and has been authorized for detection and/or diagnosis of SARS-CoV-2 by FDA under an Emergency Use Authorization (EUA).  This EUA will remain in effect (meaning this test can be used) for the duration of the COVID-19 declaration under Section 564(b)(1) of the Act, 21 U.S.C. section 360bbb-3(b)(1), unless the authorization is terminated or revoked sooner. Performed at [facility], [place] [address], [place], [place] [address]   Blood Culture (routine x 2)     Status: None (Preliminary result)   Collection Time: 09/17/18  7:39 PM   Specimen: BLOOD RIGHT FOREARM  Result Value Ref Range Status   Specimen Description   Final    BLOOD RIGHT FOREARM Performed at [facility], [place] [address] [place], [place] [address]    Special Requests   Final    Blood Culture results may not be optimal due to an inadequate volume of blood received in culture bottles BOTTLES DRAWN AEROBIC AND ANAEROBIC Performed at [facility], [place] [address] [place], [place] [address]    Culture PENDING  Incomplete   Report Status PENDING  Incomplete     Radiological Exams on Admission: Dg Chest Port 1 View  Result Date: 09/17/2018 CLINICAL DATA:  85 y/o  F; cough,  shortness of breath, fever. EXAM: PORTABLE CHEST 1 VIEW COMPARISON:  06/17/2018 chest radiograph FINDINGS: Stable cardiac silhouette given projection and technique. Aortic atherosclerosis with calcification. Left pleural effusion and basilar consolidation. Hazy opacities at periphery of right lower lung. No pneumothorax. No acute osseous abnormality is evident. IMPRESSION: Left pleural effusion and basilar consolidation, probably pneumonia. Electronically Signed   By: Kristine Garbe M.D.   On: 09/17/2018 20:14    EKG: Sinus rhythm at 80 bpm with normal axis, LAHB. LVH and nonspecific ST-T wave changes.   Assessment/Plan  This is a 85 y.o. female with a history of Alzheimer's dementia, hypertension, hyperlipidemia, GERD, diabetes now being admitted with:  #. Healthcare associated Pneumonia - Admit to inpatient - IV per pharmacy: Zosyn and Vanco - Check flu swab - IV fluid hydration - Duonebs, expectorants & O2 therapy as needed - Follow up blood & sputum cultures  #. Mild hypokalemia - Replace orally  #.  History of dementia -Continue memantine, Exelon  #. H/o Diabetes - Accuchecks achs with RISS coverage - Heart healthy, carb controlled diet  Admission status: Inpatient IV Fluids: Normal saline Diet/Nutrition: Heart healthy, carb controlled Consults called: None DVT Px: Lovenox, SCDs and early ambulation. Code Status: Full Code  Disposition Plan: To home in 1-2 days  All the records are reviewed and case discussed with ED provider. Management plans discussed with the patient and/or family who express understanding and agree with plan of care.  Grace Gilbert D.O. on 09/17/2018 at 11:21 PM CC: Primary care physician; [name], MD   09/17/2018, 11:21 PM

## 2018-09-17 NOTE — Progress Notes (Signed)
Location: [facility] [employer] features editor of Service:  SNF (31)  Provider:   Code Status:  Goals of Care:  Advanced Directives 09/14/2018  Does Patient Have a Medical Advance Directive? Yes  Type of Advance Directive [place]  Does patient want to make changes to medical advance directive? No - Patient declined  Copy of [place] in Chart? Yes - validated most recent copy scanned in chart (See row information)  Pre-existing out of facility DNR order (yellow form or pink MOST form) -     Acute Visit   HPI: Patient is a 85 y.o. female seen today for an acute visit for Fever , Cough Sob after H/o Covid Exposure on 09/09/18 Patient has h/o Hyperlipidemia, Osteoporosis, Allergic Rhinitis and Dementia Alzheimer  Patient is long term resident of facility. She was tested for Covid due to exposure to Nursing aid on 09/09/18  who later was positive. Her Covid test was negative yesterday.  But today she spiked a low grade temp with Cough and SOB. Her Chest Xray shows Left Middle and Lower Lobe infiltrate and her White count is 25 K She is 80% on RA Patient unable to give any history due to her dementia. She is confused but not in distress.  Past Medical History:  Diagnosis Date  . Abnormal CT scan, chest September 2011   Scar tissue  . Allergic rhinitis   . Alzheimer's dementia ([place])   . Asthma   . Diabetes mellitus    borderline and under control-diet ,exercise  . Esophageal stricture   . Family history of colon cancer    Mother  . GERD (gastroesophageal reflux disease)   . Hiatal hernia   . HLD (hyperlipidemia)   . Hypertension   . Osteopenia   . Pneumonia 12-15 yrs.ago   yrs. ago  . TIA (transient ischemic attack)   . Vitamin D deficiency     Past Surgical History:  Procedure Laterality Date  . ABDOMINAL HYSTERECTOMY  1980   . CATARACT EXTRACTION Left 2014  . SHOULDER OPEN ROTATOR CUFF REPAIR  05/22/2011   Procedure: ROTATOR CUFF  REPAIR SHOULDER OPEN;  Surgeon: Tobi Bastos, MD;  Location: [facility];  Service: Orthopedics;  Laterality: Left;  . THORACOTOMY / DECORTICATION PARIETAL PLEURA Right 204   empyema  . TONSILLECTOMY      Allergies  Allergen Reactions  . Aricept [Donepezil Hcl] Diarrhea  . Crestor [Rosuvastatin Calcium]     Myalgia  . Lipitor [Atorvastatin Calcium]   . Lovastatin     Myalgia  . Micardis [Telmisartan]     Fatigue  . Sertraline Diarrhea  . Welchol [Colesevelam Hcl]     Constipation  . Zetia [Ezetimibe]     Myalgia  . Zocor [Simvastatin]     Myalgia    Outpatient Encounter Medications as of 09/17/2018  Medication Sig  . alendronate (FOSAMAX) 70 MG tablet TAKE 1 TAB ONCE A WEEK, AT LEAST 30 MIN BEFORE 1ST FOOD.DO NOT LIE DOWN FOR 30 MIN AFTER TAKING.  Marland Kitchen aspirin EC 81 MG tablet Take 1 tablet (81 mg total) by mouth daily.  . calcium-vitamin D (OSCAL WITH D) 500-200 MG-UNIT tablet Take 1 tablet by mouth daily with breakfast.  . cetirizine (ZYRTEC) 10 MG tablet Take 10 mg by mouth at bedtime.   . memantine (NAMENDA XR) 28 MG CP24 24 hr capsule TAKE ONE CAPSULE ONCE A DAY TO PRESERVE MEMORY.  . Omega-3 Fatty Acids (FISH OIL OMEGA-3) 1000  MG CAPS Take 1 capsule by mouth daily.  . rivastigmine (EXELON) 9.5 mg/24hr Apply fresh patch daily and remove old patch to help preserve memory  . zinc oxide 20 % ointment Apply 1 application topically as needed for irritation. Apply to peri/buttocks topical   No facility-administered encounter medications on file as of 09/17/2018.     Review of Systems:  Review of Systems  Unable to perform ROS: Dementia    Health Maintenance  Topic Date Due  . FOOT EXAM  12/06/1942  . OPHTHALMOLOGY EXAM  12/06/1942  . URINE MICROALBUMIN  02/11/2017  . HEMOGLOBIN A1C  10/05/2018  . INFLUENZA VACCINE  10/31/2018  . TETANUS/TDAP  12/16/2021  . DEXA SCAN  Completed  . PNA vac Low Risk Adult  Completed    Physical Exam: Vitals:   09/17/18 1752  BP: 120/70   Pulse: 100  Temp: 99.8 F (37.7 C)  SpO2: (!) 87%   There is no height or weight on file to calculate BMI. Physical Exam Vitals signs reviewed.  HENT:     Head: Normocephalic.     Nose: Nose normal.     Mouth/Throat:     Mouth: Mucous membranes are dry.     Comments: Thrush  Eyes:     Pupils: Pupils are equal, round, and reactive to light.  Neck:     Musculoskeletal: Neck supple.  Cardiovascular:     Rate and Rhythm: Tachycardia present.     Pulses: Normal pulses.     Heart sounds: Normal heart sounds.  Pulmonary:     Comments: Bilateral Rales Abdominal:     General: Abdomen is flat. Bowel sounds are normal.     Palpations: Abdomen is soft.  Musculoskeletal:        General: No swelling.  Neurological:     General: No focal deficit present.     Mental Status: She is alert.     Comments: Has Progressive Aphasia with Dementia  Psychiatric:        Mood and Affect: Mood normal.        Thought Content: Thought content normal.        Judgment: Judgment normal.     Labs reviewed: Basic Metabolic Panel: Recent Labs    12/29/17 0745 04/06/18 0000 06/17/18 1118  NA 140 141 140  K 4.4 4.5 3.7  CL 102 103 107  CO2 26 30 24   GLUCOSE 107* 104* 102*  BUN 12 12 10   CREATININE 0.73 0.73 0.69  CALCIUM 9.4 9.9 8.9  TSH 4.29  --   --    Liver Function Tests: Recent Labs    12/29/17 0745 04/06/18 0000  AST 20 17  ALT 11 11  BILITOT 1.0 1.3*  PROT 7.2 7.3   No results for input(s): LIPASE, AMYLASE in the last 8760 hours. No results for input(s): AMMONIA in the last 8760 hours. CBC: Recent Labs    04/06/18 0000 06/17/18 1118  WBC 8.9 7.9  NEUTROABS 4,610  --   HGB 13.3 11.5*  HCT 39.7 36.9  MCV 87.4 91.3  PLT 324 223   Lipid Panel: Recent Labs    12/29/17 0745 04/06/18 0000  CHOL 282* 275*  HDL 49* 52  LDLCALC 190* 190*  TRIG 233* 161*  CHOLHDL 5.8* 5.3*   Lab Results  Component Value Date   HGBA1C 5.8 (H) 04/06/2018    Procedures since last  visit: No results found.  Assessment/Plan HCAP with WBC of 25k and Hypoxia COVID Exposure but test  Negative yesterday. Will send to ED  patient for further Eval.  Labs/tests ordered:   Total time spent in this patient care encounter was  45_  minutes; greater than 50% of the visit spent counseling patient and staff, reviewing records , Labs and coordinating care for problems addressed at this encounter.

## 2018-09-18 LAB — CBC
HCT: 30.4 % — ABNORMAL LOW (ref 36.0–46.0)
Hemoglobin: 9.8 g/dL — ABNORMAL LOW (ref 12.0–15.0)
MCH: 29 pg (ref 26.0–34.0)
MCHC: 32.2 g/dL (ref 30.0–36.0)
MCV: 89.9 fL (ref 80.0–100.0)
Platelets: 392 10*3/uL (ref 150–400)
RBC: 3.38 MIL/uL — ABNORMAL LOW (ref 3.87–5.11)
RDW: 16.8 % — ABNORMAL HIGH (ref 11.5–15.5)
WBC: 23.6 10*3/uL — ABNORMAL HIGH (ref 4.0–10.5)
nRBC: 0 % (ref 0.0–0.2)

## 2018-09-18 LAB — GLUCOSE, CAPILLARY
Glucose-Capillary: 126 mg/dL — ABNORMAL HIGH (ref 70–99)
Glucose-Capillary: 130 mg/dL — ABNORMAL HIGH (ref 70–99)
Glucose-Capillary: 118 mg/dL — ABNORMAL HIGH (ref 70–99)
Glucose-Capillary: 107 mg/dL — ABNORMAL HIGH (ref 70–99)
Glucose-Capillary: 102 mg/dL — ABNORMAL HIGH (ref 70–99)

## 2018-09-18 LAB — BASIC METABOLIC PANEL
Anion gap: 11 (ref 5–15)
BUN: 7 mg/dL — ABNORMAL LOW (ref 8–23)
CO2: 25 mmol/L (ref 22–32)
Calcium: 7.7 mg/dL — ABNORMAL LOW (ref 8.9–10.3)
Chloride: 99 mmol/L (ref 98–111)
Creatinine, Ser: 0.49 mg/dL (ref 0.44–1.00)
GFR calc Af Amer: >60 mL/min (ref >60)
GFR calc non Af Amer: >60 mL/min (ref >60)
Glucose, Bld: 150 mg/dL — ABNORMAL HIGH (ref 70–99)
Potassium: 2.9 mmol/L — ABNORMAL LOW (ref 3.5–5.1)
Sodium: 135 mmol/L (ref 135–145)

## 2018-09-18 LAB — AMMONIA: Ammonia: 24 umol/L (ref 9–35)

## 2018-09-18 LAB — MAGNESIUM: Magnesium: 1.9 mg/dL (ref 1.7–2.4)

## 2018-09-18 LAB — CREATININE, SERUM
Creatinine, Ser: 0.51 mg/dL (ref 0.44–1.00)
GFR calc Af Amer: >60 mL/min (ref >60)
GFR calc non Af Amer: >60 mL/min (ref >60)

## 2018-09-18 LAB — PHOSPHORUS: Phosphorus: 1.7 mg/dL — ABNORMAL LOW (ref 2.5–4.6)

## 2018-09-18 LAB — INFLUENZA PANEL BY PCR (TYPE A & B)
Influenza A By PCR: NEGATIVE
Influenza B By PCR: NEGATIVE

## 2018-09-18 LAB — MRSA PCR SCREENING: MRSA by PCR: NEGATIVE

## 2018-09-18 LAB — LACTIC ACID, PLASMA: Lactic Acid, Venous: 1.3 mmol/L (ref 0.5–1.9)

## 2018-09-18 LAB — SARS CORONAVIRUS 2 BY RT PCR (HOSPITAL ORDER, PERFORMED IN ~~LOC~~ HOSPITAL LAB): SARS Coronavirus 2: NEGATIVE

## 2018-09-18 MED ORDER — BISACODYL 5 MG PO TBEC: 5.0000 mg | DELAYED_RELEASE_TABLET | Freq: Every day | ORAL | Status: DC | PRN

## 2018-09-18 MED ORDER — BENZONATATE 100 MG PO CAPS
100.0000 mg | ORAL_CAPSULE | Freq: Three times a day (TID) | ORAL | Status: DC | PRN
  Administered 2018-09-21: 100 mg via ORAL
  Filled 2018-09-18: qty 1

## 2018-09-18 MED ORDER — INSULIN ASPART 100 UNIT/ML ~~LOC~~ SOLN
0.0000 [IU] | Freq: Three times a day (TID) | SUBCUTANEOUS | Status: DC
  Administered 2018-09-18 – 2018-09-21 (×4): 1 [IU] via SUBCUTANEOUS
  Administered 2018-09-23 – 2018-09-24 (×3): 2 [IU] via SUBCUTANEOUS
  Administered 2018-09-24 – 2018-09-28 (×5): 1 [IU] via SUBCUTANEOUS

## 2018-09-18 MED ORDER — POTASSIUM CHLORIDE 10 MEQ/100ML IV SOLN
10.0000 meq | INTRAVENOUS | Status: AC
  Administered 2018-09-18 (×4): 10 meq via INTRAVENOUS
  Filled 2018-09-18 (×4): qty 100

## 2018-09-18 MED ORDER — BENZONATATE 100 MG PO CAPS: 100.0000 mg | ORAL_CAPSULE | Freq: Three times a day (TID) | ORAL | Status: DC | PRN

## 2018-09-18 MED ORDER — FISH OIL OMEGA-3 1000 MG PO CAPS: 1.0000 | ORAL_CAPSULE | Freq: Every day | ORAL | Status: DC

## 2018-09-18 MED ORDER — SENNOSIDES-DOCUSATE SODIUM 8.6-50 MG PO TABS: 1.0000 | ORAL_TABLET | Freq: Every evening | ORAL | Status: DC | PRN

## 2018-09-18 MED ORDER — IPRATROPIUM-ALBUTEROL 0.5-2.5 (3) MG/3ML IN SOLN: 3.0000 mL | Freq: Four times a day (QID) | RESPIRATORY_TRACT | Status: DC | PRN

## 2018-09-18 MED ORDER — RIVASTIGMINE 9.5 MG/24HR TD PT24
9.5000 mg | MEDICATED_PATCH | Freq: Every day | TRANSDERMAL | Status: DC
  Administered 2018-09-18 – 2018-09-28 (×11): 9.5 mg via TRANSDERMAL
  Filled 2018-09-18 (×11): qty 1

## 2018-09-18 MED ORDER — MAGNESIUM CITRATE PO SOLN: 1.0000 | Freq: Once | ORAL | Status: DC | PRN

## 2018-09-18 MED ORDER — ENOXAPARIN SODIUM 40 MG/0.4ML ~~LOC~~ SOLN
40.0000 mg | SUBCUTANEOUS | Status: DC
  Administered 2018-09-18 – 2018-09-24 (×7): 40 mg via SUBCUTANEOUS
  Filled 2018-09-18 (×6): qty 0.4

## 2018-09-18 MED ORDER — OMEGA-3-ACID ETHYL ESTERS 1 G PO CAPS
1.0000 g | ORAL_CAPSULE | Freq: Every day | ORAL | Status: DC
  Administered 2018-09-18 – 2018-09-25 (×8): 1 g via ORAL
  Filled 2018-09-18 (×9): qty 1

## 2018-09-18 MED ORDER — ACETAMINOPHEN 325 MG PO TABS
650.0000 mg | ORAL_TABLET | Freq: Four times a day (QID) | ORAL | Status: DC | PRN
  Administered 2018-09-21 – 2018-09-25 (×2): 650 mg via ORAL
  Filled 2018-09-18 (×2): qty 2

## 2018-09-18 MED ORDER — CALCIUM CARBONATE-VITAMIN D 500-200 MG-UNIT PO TABS
1.0000 | ORAL_TABLET | Freq: Every day | ORAL | Status: DC
  Administered 2018-09-18 – 2018-09-28 (×9): 1 via ORAL
  Filled 2018-09-18 (×10): qty 1

## 2018-09-18 MED ORDER — INSULIN ASPART 100 UNIT/ML ~~LOC~~ SOLN: 0.0000 [IU] | Freq: Every day | SUBCUTANEOUS | Status: DC

## 2018-09-18 MED ORDER — POTASSIUM CHLORIDE IN NACL 20-0.9 MEQ/L-% IV SOLN
INTRAVENOUS | Status: DC
  Administered 2018-09-18 (×2): via INTRAVENOUS
  Filled 2018-09-18 (×2): qty 1000

## 2018-09-18 MED ORDER — ACETAMINOPHEN 650 MG RE SUPP: 650.0000 mg | Freq: Four times a day (QID) | RECTAL | Status: DC | PRN

## 2018-09-18 MED ORDER — MEMANTINE HCL ER 28 MG PO CP24
28.0000 mg | ORAL_CAPSULE | Freq: Every day | ORAL | Status: DC
  Administered 2018-09-18 – 2018-09-28 (×9): 28 mg via ORAL
  Filled 2018-09-18 (×11): qty 1

## 2018-09-18 MED ORDER — POTASSIUM CHLORIDE CRYS ER 20 MEQ PO TBCR
40.0000 meq | EXTENDED_RELEASE_TABLET | Freq: Once | ORAL | Status: AC
  Administered 2018-09-18: 40 meq via ORAL
  Filled 2018-09-18: qty 2

## 2018-09-18 MED ORDER — VANCOMYCIN HCL IN DEXTROSE 750-5 MG/150ML-% IV SOLN
750.0000 mg | INTRAVENOUS | Status: DC
  Administered 2018-09-19: 750 mg via INTRAVENOUS
  Filled 2018-09-18: qty 150

## 2018-09-18 MED ORDER — ONDANSETRON HCL 4 MG/2ML IJ SOLN: 4.0000 mg | Freq: Four times a day (QID) | INTRAMUSCULAR | Status: DC | PRN

## 2018-09-18 MED ORDER — ONDANSETRON HCL 4 MG PO TABS: 4.0000 mg | ORAL_TABLET | Freq: Four times a day (QID) | ORAL | Status: DC | PRN

## 2018-09-18 MED ORDER — SODIUM CHLORIDE 0.9 % IV SOLN
INTRAVENOUS | Status: DC
  Administered 2018-09-18: 06:00:00 via INTRAVENOUS

## 2018-09-18 MED ORDER — PIPERACILLIN-TAZOBACTAM 3.375 G IVPB
3.3750 g | Freq: Three times a day (TID) | INTRAVENOUS | Status: DC
  Administered 2018-09-18 – 2018-09-23 (×17): 3.375 g via INTRAVENOUS
  Filled 2018-09-18 (×17): qty 50

## 2018-09-18 MED ORDER — LORATADINE 10 MG PO TABS
10.0000 mg | ORAL_TABLET | Freq: Every day | ORAL | Status: DC
  Administered 2018-09-18 – 2018-09-28 (×9): 10 mg via ORAL
  Filled 2018-09-18 (×9): qty 1

## 2018-09-18 MED ORDER — ASPIRIN EC 81 MG PO TBEC
81.0000 mg | DELAYED_RELEASE_TABLET | Freq: Every day | ORAL | Status: DC
  Administered 2018-09-18 – 2018-09-28 (×9): 81 mg via ORAL
  Filled 2018-09-18 (×9): qty 1

## 2018-09-18 NOTE — ED Notes (Signed)
Zosyn infusing during transport Vancomycin on pole waiting to start once Zosyn is completed Richelle RN aware. Flu swab sent waiting results

## 2018-09-18 NOTE — Evaluation (Signed)
Physical Therapy Evaluation Patient Details Name: Grace Gilbert MRN: [id number] DOB: 1932/12/02 Today's Date: 09/18/2018   History of Present Illness  85 y.o. female with a known history of Alzheimer's dementia, hypertension, hyperlipidemia, GERD, diabetes presents to the emergency department for evaluation of fever, cough, shortness of breath.  Patient was sent to the emergency department by her nursing home because she had developed symptoms of fever, shortness of breath and cough.  Chest x-ray showed pneumonia.  Clinical Impression  Pt admitted with above diagnosis. Pt currently with functional limitations due to the deficits listed below (see PT Problem List). Max assist supine to sit, mod assist to take a few pivotal steps with RW to recliner. Pt lethargic but did attempt to participate. She is oriented to self only, was unable to recall if she was ambulatory prior to admission. Pt will benefit from skilled PT to increase their independence and safety with mobility to allow discharge to the venue listed below.       Follow Up Recommendations SNF;Supervision for mobility/OOB    Equipment Recommendations  None recommended by PT    Recommendations for Other Services       Precautions / Restrictions Precautions Precautions: Fall Restrictions Weight Bearing Restrictions: No      Mobility  Bed Mobility Overal bed mobility: Needs Assistance Bed Mobility: Supine to Sit     Supine to sit: Max assist     General bed mobility comments: assist to raise trunk and pivot hips to edge of bed  Transfers Overall transfer level: Needs assistance Equipment used: Rolling walker (2 wheeled) Transfers: Sit to/from [facility] Sit to Stand: Mod assist;+2 safety/equipment Stand pivot transfers: Mod assist       General transfer comment: mod A to rise, mod A with verbal/manual cues to take several pivotal steps to recliner  Ambulation/Gait                Stairs            Wheelchair Mobility    Modified Rankin (Stroke Patients Only)       Balance Overall balance assessment: Needs assistance Sitting-balance support: Feet supported;Bilateral upper extremity supported Sitting balance-Leahy Scale: Fair     Standing balance support: Single extremity supported Standing balance-Leahy Scale: Fair                               Pertinent Vitals/Pain Pain Assessment: Faces Faces Pain Scale: No hurt    Home Living Family/patient expects to be discharged to:: Skilled nursing facility                      Prior Function           Comments: unknown -pt not able to provide PLOF 2* dementia     Hand Dominance        Extremity/Trunk Assessment   Upper Extremity Assessment Upper Extremity Assessment: Overall WFL for tasks assessed    Lower Extremity Assessment Lower Extremity Assessment: Generalized weakness(B knee ext -4/5)    Cervical / Trunk Assessment Cervical / Trunk Assessment: Normal  Communication   Communication: No difficulties  Cognition Arousal/Alertness: Lethargic Behavior During Therapy: WFL for tasks assessed/performed Overall Cognitive Status: No family/caregiver present to determine baseline cognitive functioning  General Comments: oriented to self only, cannot recall if she was able to walk PTA      General Comments      Exercises     Assessment/Plan    PT Assessment Patient needs continued PT services  PT Problem List Decreased strength;Decreased activity tolerance;Decreased mobility;Decreased cognition       PT Treatment Interventions Gait training;DME instruction;Therapeutic activities;Therapeutic exercise;Functional mobility training;Patient/family education    PT Goals (Current goals can be found in the Care Plan section)  Acute Rehab PT Goals PT Goal Formulation: Patient unable to participate in goal setting Time For Goal  Achievement: 10/02/18    Frequency Min 2X/week   Barriers to discharge        Co-evaluation               AM-PAC PT "6 Clicks" Mobility  Outcome Measure Help needed turning from your back to your side while in a flat bed without using bedrails?: A Lot Help needed moving from lying on your back to sitting on the side of a flat bed without using bedrails?: A Lot Help needed moving to and from a bed to a chair (including a wheelchair)?: A Lot Help needed standing up from a chair using your arms (e.g., wheelchair or bedside chair)?: A Lot Help needed to walk in hospital room?: A Lot Help needed climbing 3-5 steps with a railing? : Total 6 Click Score: 11    End of Session Equipment Utilized During Treatment: Gait belt;Oxygen Activity Tolerance: Patient tolerated treatment well;No increased pain Patient left: in chair;with call bell/phone within reach;with chair alarm set Nurse Communication: Mobility status PT Visit Diagnosis: Muscle weakness (generalized) (M62.81);Difficulty in walking, not elsewhere classified (R26.2)    Time: 4562-5638 PT Time Calculation (min) (ACUTE ONLY): 16 min   Charges:   PT Evaluation $PT Eval Moderate Complexity: 1 Mod          Philomena Doheny PT 09/18/2018  Acute Rehabilitation Services Pager [phone] Office [phone]

## 2018-09-18 NOTE — Progress Notes (Signed)
PROGRESS NOTE    Grace Gilbert  WLS:[id number] DOB: 10/18/1932 DOA: 09/17/2018 PCP: [name], MD  Outpatient Specialists:   Brief Narrative:  As per H&P "Grace Gilbert is a 85 y.o. female with a known history of Alzheimer's dementia, hypertension, hyperlipidemia, GERD, diabetes presents to the emergency department for evaluation of fever, cough, shortness of breath.  Patient was sent to the emergency department by her nursing home because she had developed symptoms of fever, shortness of breath and cough.  Chest x-ray done today was read as pneumonia.  Her white blood cell count was 24,000.  Of note she was tested negative for COVID 2 days ago at her facility.   EMS/ED Course: Patient received Rocephin and a azithromycin. Medical admission has been requested for further management of pneumonia".  09/18/2018: Patient seen alongside patient's nurse.  No significant history from the patient.  Patient is quite sleepy.  Will check patient's ammonia level.  Will also screen patient for MRSA.  Infectious disease input is appreciated.  Infectious disease team has advised repeating repeat COVID-19 testing.  Will discontinue vancomycin if MRSA PCR is negative.  Assessment & Plan:   Active Problems:   HCAP (healthcare-associated pneumonia)  Healthcare associated Pneumonia: - Admit to inpatient - IV per pharmacy: Zosyn and Vanco - Check flu swab - IV fluid hydration - Duonebs, expectorants & O2 therapy as needed - Follow up blood & sputum cultures 09/18/2018: MRSA screening.  Repeat COVID-19 test came back negative.  Will discontinue vancomycin if MRSA screen comes back negative.  Future evaluation to rule out aspiration.  Chest x-ray finding is noted.  Hypokalemia: Continue to monitor and replete. Magnesium level is 1.9.   Further management will depend on above result.    History of dementia: -Continue memantine, Exelon  Diabetes: Monitor closely.  Diet/Nutrition: Heart  healthy, carb controlled Consults called: None DVT Px: Lovenox, SCDs and early ambulation. Code Status: Full Code  Disposition Plan:  Back to skilled nursing facility eventually.  Procedures:   None  Antimicrobials:   IV vancomycin  IV Zosyn   Subjective: No history from patient Patient is sleepy.  Objective: Vitals:   09/18/18 0114 09/18/18 0120 09/18/18 0141 09/18/18 0623  BP: 129/63   132/62  Pulse: 84   75  Resp: (!) 22   (!) 22  Temp: 98.5 F (36.9 C)   97.6 F (36.4 C)  TempSrc: Oral   Oral  SpO2: 94%   90%  Weight:  60 kg    Height:   5' (1.524 m)     Intake/Output Summary (Last 24 hours) at 09/18/2018 0919 Last data filed at 09/18/2018 0620 Gross per 24 hour  Intake 1199.53 ml  Output 0 ml  Net 1199.53 ml   Filed Weights   09/18/18 0120  Weight: 60 kg    Examination:  General exam: Sleepy Respiratory system: Decreased air entry Cardiovascular system: S1 & S2 heard, RRR. No JVD, murmurs, rubs, gallops or clicks. No pedal edema. Gastrointestinal system: Abdomen is nondistended, soft and nontender. No organomegaly or masses felt. Normal bowel sounds heard. Central nervous system: Sleepy.  Data Reviewed: I have personally reviewed following labs and imaging studies  CBC: Recent Labs  Lab 09/17/18 1934 09/18/18 0120  WBC 25.6* 23.6*  NEUTROABS 21.1*  --   HGB 11.0* 9.8*  HCT 34.6* 30.4*  MCV 89.2 89.9  PLT 466* 811   Basic Metabolic Panel: Recent Labs  Lab 09/17/18 1934 09/18/18 0120 09/18/18 0500  NA 134*  --  135  K 3.4*  --  2.9*  CL 95*  --  99  CO2 27  --  25  GLUCOSE 151*  --  150*  BUN 9  --  7*  CREATININE 0.46 0.51 0.49  CALCIUM 8.7*  --  7.7*  MG  --  1.9  --   PHOS  --  1.7*  --    GFR: Estimated Creatinine Clearance: 43.2 mL/min (by C-G formula based on SCr of 0.49 mg/dL). Liver Function Tests: Recent Labs  Lab 09/17/18 1934  AST 48*  ALT 29  ALKPHOS 168*  BILITOT 1.3*  PROT 8.0  ALBUMIN 2.7*   No results  for input(s): LIPASE, AMYLASE in the last 168 hours. No results for input(s): AMMONIA in the last 168 hours. Coagulation Profile: No results for input(s): INR, PROTIME in the last 168 hours. Cardiac Enzymes: No results for input(s): CKTOTAL, CKMB, CKMBINDEX, TROPONINI in the last 168 hours. BNP (last 3 results) No results for input(s): PROBNP in the last 8760 hours. HbA1C: No results for input(s): HGBA1C in the last 72 hours. CBG: Recent Labs  Lab 09/18/18 0132 09/18/18 0719  GLUCAP 126* 130*   Lipid Profile: No results for input(s): CHOL, HDL, LDLCALC, TRIG, CHOLHDL, LDLDIRECT in the last 72 hours. Thyroid Function Tests: No results for input(s): TSH, T4TOTAL, FREET4, T3FREE, THYROIDAB in the last 72 hours. Anemia Panel: No results for input(s): VITAMINB12, FOLATE, FERRITIN, TIBC, IRON, RETICCTPCT in the last 72 hours. Urine analysis:    Component Value Date/Time   COLORURINE YELLOW 06/17/2018 1316   APPEARANCEUR HAZY (A) 06/17/2018 1316   LABSPEC 1.006 06/17/2018 1316   PHURINE 7.0 06/17/2018 1316   GLUCOSEU NEGATIVE 06/17/2018 1316   HGBUR SMALL (A) 06/17/2018 1316   BILIRUBINUR NEGATIVE 06/17/2018 1316   KETONESUR NEGATIVE 06/17/2018 1316   PROTEINUR NEGATIVE 06/17/2018 1316   UROBILINOGEN 0.2 05/15/2011 1358   NITRITE NEGATIVE 06/17/2018 1316   LEUKOCYTESUR LARGE (A) 06/17/2018 1316   Sepsis Labs: @LABRCNTIP (procalcitonin:4,lacticidven:4)  ) Recent Results (from the past 240 hour(s))  Blood Culture (routine x 2)     Status: None (Preliminary result)   Collection Time: 09/17/18  7:34 PM   Specimen: BLOOD LEFT WRIST  Result Value Ref Range Status   Specimen Description   Final    BLOOD LEFT WRIST Performed at [facility] [address] [place], [place] [address]    Special Requests   Final    BOTTLES DRAWN AEROBIC AND ANAEROBIC Blood Culture results may not be optimal due to an inadequate volume of blood received in culture bottles Performed at [facility] [address] [place], [place] [address]    Culture   Final    NO GROWTH < 12 HOURS Performed at [facility] [address] [place], [place] [address]    Report Status PENDING  Incomplete  SARS Coronavirus 2 (CEPHEID- Performed in [facility] hospital lab), Hosp Order     Status: None   Collection Time: 09/17/18  7:35 PM   Specimen: Nasopharyngeal Swab  Result Value Ref Range Status   SARS Coronavirus 2 NEGATIVE NEGATIVE Final    Comment: (NOTE) If result is NEGATIVE SARS-CoV-2 target nucleic acids are NOT DETECTED. The SARS-CoV-2 RNA is generally detectable in upper and lower  respiratory specimens during the acute phase of infection. The lowest  concentration of SARS-CoV-2 viral copies this assay can detect is 250  copies / mL. A negative result does not preclude SARS-CoV-2  infection  and should not be used as the sole basis for treatment or other  patient management decisions.  A negative result may occur with  improper specimen collection / handling, submission of specimen other  than nasopharyngeal swab, presence of viral mutation(s) within the  areas targeted by this assay, and inadequate number of viral copies  (<250 copies / mL). A negative result must be combined with clinical  observations, patient history, and epidemiological information. If result is POSITIVE SARS-CoV-2 target nucleic acids are DETECTED. The SARS-CoV-2 RNA is generally detectable in upper and lower  respiratory specimens dur ing the acute phase of infection.  Positive  results are indicative of active infection with SARS-CoV-2.  Clinical  correlation with patient history and other diagnostic information is  necessary to determine patient infection status.  Positive results do  not rule out bacterial infection or co-infection with other viruses. If result is PRESUMPTIVE POSTIVE SARS-CoV-2 nucleic acids MAY BE PRESENT.   A presumptive positive result was obtained on  the submitted specimen  and confirmed on repeat testing.  While 2019 novel coronavirus  (SARS-CoV-2) nucleic acids may be present in the submitted sample  additional confirmatory testing may be necessary for epidemiological  and / or clinical management purposes  to differentiate between  SARS-CoV-2 and other Sarbecovirus currently known to infect humans.  If clinically indicated additional testing with an alternate test  methodology [phone]) is advised. The SARS-CoV-2 RNA is generally  detectable in upper and lower respiratory sp ecimens during the acute  phase of infection. The expected result is Negative. Fact Sheet for Patients:  StrictlyIdeas.no Fact Sheet for Healthcare Providers: BankingDealers.co.za This test is not yet approved or cleared by the [place] FDA and has been authorized for detection and/or diagnosis of SARS-CoV-2 by FDA under an Emergency Use Authorization (EUA).  This EUA will remain in effect (meaning this test can be used) for the duration of the COVID-19 declaration under Section 564(b)(1) of the Act, 21 U.S.C. section 360bbb-3(b)(1), unless the authorization is terminated or revoked sooner. Performed at [facility], [place] [address], [place], [place] [address]   Blood Culture (routine x 2)     Status: None (Preliminary result)   Collection Time: 09/17/18  7:39 PM   Specimen: BLOOD RIGHT FOREARM  Result Value Ref Range Status   Specimen Description   Final    BLOOD RIGHT FOREARM Performed at [facility], [place] [address], [place], [place] [address]    Special Requests   Final    Blood Culture results may not be optimal due to an inadequate volume of blood received in culture bottles BOTTLES DRAWN AEROBIC AND ANAEROBIC Performed at [facility], [place] [address] [place], [place] [address]    Culture   Final    NO GROWTH < 12 HOURS Performed at [facility] [address], [place], [place] [address]    Report Status PENDING  Incomplete         Radiology Studies: Dg Chest Port 1 View  Result Date: 09/17/2018 CLINICAL DATA:  85 y/o  F; cough, shortness of breath, fever. EXAM: PORTABLE CHEST 1 VIEW COMPARISON:  06/17/2018 chest radiograph FINDINGS: Stable cardiac silhouette given projection and technique. Aortic atherosclerosis with calcification. Left pleural effusion and basilar consolidation. Hazy opacities at periphery of right lower lung. No pneumothorax. No acute osseous abnormality is evident. IMPRESSION: Left pleural effusion and basilar consolidation, probably pneumonia. Electronically Signed   By: Mia Creek  [place]-Stratton M.D.   On: 09/17/2018 20:14        Scheduled Meds: . aspirin EC  81 mg Oral Daily  . calcium-vitamin D  1 tablet Oral Q breakfast  . enoxaparin (LOVENOX) injection  40 mg Subcutaneous Q24H  . insulin aspart  0-5 Units Subcutaneous QHS  . insulin aspart  0-9 Units Subcutaneous TID WC  . loratadine  10 mg Oral Daily  . memantine  28 mg Oral Daily  . omega-3 acid ethyl esters  1 g Oral Daily  . rivastigmine  9.5 mg Transdermal Daily   Continuous Infusions: . 0.9 % NaCl with KCl 20 mEq / L    . piperacillin-tazobactam (ZOSYN)  IV 3.375 g (09/18/18 0620)  . potassium chloride    . [START ON 09/19/2018] vancomycin       LOS: 1 day    Time spent: 35 Minutes.     Dana Allan, MD  [facility] Pager #: [phone] 7PM-7AM contact night coverage as above

## 2018-09-18 NOTE — Evaluation (Signed)
Clinical/Bedside Swallow Evaluation Patient Details  Name: DONESHA Gilbert MRN: [id number] Date of Birth: Aug 28, 1932  Today's Date: 09/18/2018 Time: SLP Start Time (ACUTE ONLY): 1145 SLP Stop Time (ACUTE ONLY): 1200 SLP Time Calculation (min) (ACUTE ONLY): 15 min  Past Medical History:  Past Medical History:  Diagnosis Date  . Abnormal CT scan, chest September 2011   Scar tissue  . Allergic rhinitis   . Alzheimer's dementia ([place])   . Asthma   . Diabetes mellitus    borderline and under control-diet ,exercise  . Esophageal stricture   . Family history of colon cancer    Mother  . GERD (gastroesophageal reflux disease)   . Hiatal hernia   . HLD (hyperlipidemia)   . Hypertension   . Osteopenia   . Pneumonia 12-15 yrs.ago   yrs. ago  . TIA (transient ischemic attack)   . Vitamin D deficiency    Past Surgical History:  Past Surgical History:  Procedure Laterality Date  . ABDOMINAL HYSTERECTOMY  1980   . CATARACT EXTRACTION Left 2014  . SHOULDER OPEN ROTATOR CUFF REPAIR  05/22/2011   Procedure: ROTATOR CUFF REPAIR SHOULDER OPEN;  Surgeon: Tobi Bastos, MD;  Location: [facility];  Service: Orthopedics;  Laterality: Left;  . THORACOTOMY / DECORTICATION PARIETAL PLEURA Right 204   empyema  . TONSILLECTOMY     HPI:  Grace Gilbert is an 85 y.o. female with a known history of Alzheimer's dementia, hypertension, hyperlipidemia, GERD, diabetes presents to the emergency department for evaluation of fever, cough, shortness of breath.  Patient was sent to the emergency department by her nursing home because she had developed symptoms of fever, shortness of breath and cough.  Chest x-ray showed pneumonia.   Assessment / Plan / Recommendation Clinical Impression  Patient presents with a mild oropharyngeal dysphagia with mild decreased efficiency with mastication of regular solids and intermittent nonproductive cough with thin liquids. Patient's vocal quality remained clear throughout this  evaluation. Per chart review, she has a h/o esophageal dysphagia. Unfortunately, unable to complete MBS to r/o aspiration until patient is off the high-risk Covid restrictions. SLP Visit Diagnosis: Dysphagia, unspecified (R13.10)    Aspiration Risk  Mild aspiration risk    Diet Recommendation Regular;Thin liquid   Liquid Administration via: Cup;Straw Medication Administration: Whole meds with puree Supervision: Intermittent supervision to cue for compensatory strategies;Patient able to self feed Compensations: Minimize environmental distractions;Slow rate;Small sips/bites Postural Changes: Seated upright at 90 degrees    Other  Recommendations Oral Care Recommendations: Oral care BID   Follow up Recommendations Skilled Nursing facility      Frequency and Duration min 2x/week  1 week       Prognosis Prognosis for Safe Diet Advancement: Good Barriers to Reach Goals: Cognitive deficits      Swallow Study   General Date of Onset: 09/17/18 HPI: Grace Gilbert is an 85 y.o. female with a known history of Alzheimer's dementia, hypertension, hyperlipidemia, GERD, diabetes presents to the emergency department for evaluation of fever, cough, shortness of breath.  Patient was sent to the emergency department by her nursing home because she had developed symptoms of fever, shortness of breath and cough.  Chest x-ray showed pneumonia. Type of Study: Bedside Swallow Evaluation Previous Swallow Assessment: N/A Diet Prior to this Study: Regular;Thin liquids Temperature Spikes Noted: No History of Recent Intubation: No Behavior/Cognition: Alert;Cooperative;Confused;Pleasant mood Oral Cavity Assessment: Within Functional Limits Oral Care Completed by SLP: Yes Oral Cavity - Dentition: Adequate natural dentition Vision: Functional for self-feeding Self-Feeding Abilities: Able  to feed self;Needs assist Patient Positioning: Upright in chair Baseline Vocal Quality: Normal Volitional Cough:  Weak Volitional Swallow: Unable to elicit    Oral/Motor/Sensory Function Overall Oral Motor/Sensory Function: Within functional limits   Ice Chips     Thin Liquid Thin Liquid: Impaired Presentation: Straw;Self Fed Pharyngeal  Phase Impairments: Cough - Delayed;Cough - Immediate Other Comments: Dry, non-productive cough was noted intermittently during thin liquid intake    Nectar Thick Nectar Thick Liquid: Not tested   Honey Thick Honey Thick Liquid: Not tested   Puree Puree: Within functional limits   Solid     Solid: Impaired Other Comments: mildly decreased efficiency of mastication      [name] 09/18/2018,5:27 PM   Sonia Baller, MA, CCC-SLP Speech Therapy [facility]

## 2018-09-18 NOTE — ED Notes (Signed)
ED TO INPATIENT HANDOFF REPORT  Name/Age/Gender Grace Gilbert 85 y.o. female  Code Status Code Status History    Date Active Date Inactive Code Status Order ID Comments User Context   05/22/2011 2038 05/24/2011 1906 Full Code [id number]  Tawanna Solo, RN Inpatient   Advance Care Planning Activity    Advance Directive Documentation     Most Recent Value  Type of Advance Directive  Healthcare Power of [place]  Pre-existing out of facility DNR order (yellow form or pink MOST form)  -  "MOST" Form in Place?  -      Home/SNF/Other Nursing Home  Chief Complaint PNA  Level of Care/Admitting Diagnosis ED Disposition    ED Disposition Condition [place]: [place] [100102]  Level of Care: Med-Surg [16]  Covid Evaluation: Confirmed COVID Negative  Diagnosis: HCAP (healthcare-associated pneumonia) [330076]  Admitting Physician: Harvie Bridge [[id number]]  Attending Physician: Sherron Monday  Estimated length of stay: past midnight tomorrow  Certification:: I certify this patient will need inpatient services for at least 2 midnights  PT Class (Do Not Modify): Inpatient [101]  PT Acc Code (Do Not Modify): Private [1]       Medical History Past Medical History:  Diagnosis Date  . Abnormal CT scan, chest September 2011   Scar tissue  . Allergic rhinitis   . Alzheimer's dementia ([place])   . Asthma   . Diabetes mellitus    borderline and under control-diet ,exercise  . Esophageal stricture   . Family history of colon cancer    Mother  . GERD (gastroesophageal reflux disease)   . Hiatal hernia   . HLD (hyperlipidemia)   . Hypertension   . Osteopenia   . Pneumonia 12-15 yrs.ago   yrs. ago  . TIA (transient ischemic attack)   . Vitamin D deficiency     Allergies Allergies  Allergen Reactions  . Aricept [Donepezil Hcl] Diarrhea  . Crestor [Rosuvastatin Calcium]     Myalgia  . Lipitor [Atorvastatin  Calcium]   . Lovastatin     Myalgia  . Micardis [Telmisartan]     Fatigue  . Sertraline Diarrhea  . Welchol [Colesevelam Hcl]     Constipation  . Zetia [Ezetimibe]     Myalgia  . Zocor [Simvastatin]     Myalgia    IV Location/Drains/Wounds Patient Lines/Drains/Airways Status   Active Line/Drains/Airways    Name:   Placement date:   Placement time:   Site:   Days:   Peripheral IV 09/17/18 Left Wrist   09/17/18    1954    Wrist   1   Incision 05/22/11 Shoulder Left   05/22/11    1807     2676          Labs/Imaging Results for orders placed or performed during the hospital encounter of 09/17/18 (from the past 48 hour(s))  Lactic acid, plasma     Status: Abnormal   Collection Time: 09/17/18  7:34 PM  Result Value Ref Range   Lactic Acid, Venous 2.1 (HH) 0.5 - 1.9 mmol/L    Comment: CRITICAL RESULT CALLED TO, READ BACK BY AND VERIFIED WITH: J.TALKINGTON AT 2128 ON 09/17/18 BY N.THOMPSON Performed at [facility], [place] [address], [place], [place] [address]   Comprehensive metabolic panel     Status: Abnormal   Collection Time: 09/17/18  7:34 PM  Result Value Ref Range   Sodium 134 (L) 135 - 145 mmol/L  Potassium 3.4 (L) 3.5 - 5.1 mmol/L   Chloride 95 (L) 98 - 111 mmol/L   CO2 27 22 - 32 mmol/L   Glucose, Bld 151 (H) 70 - 99 mg/dL   BUN 9 8 - 23 mg/dL   Creatinine, Ser 0.46 0.44 - 1.00 mg/dL   Calcium 8.7 (L) 8.9 - 10.3 mg/dL   Total Protein 8.0 6.5 - 8.1 g/dL   Albumin 2.7 (L) 3.5 - 5.0 g/dL   AST 48 (H) 15 - 41 U/L   ALT 29 0 - 44 U/L   Alkaline Phosphatase 168 (H) 38 - 126 U/L   Total Bilirubin 1.3 (H) 0.3 - 1.2 mg/dL   GFR calc non Af Amer >60 >60 mL/min   GFR calc Af Amer >60 >60 mL/min   Anion gap 12 5 - 15    Comment: Performed at [facility], [place] [address] [place], [place] [address]  CBC WITH DIFFERENTIAL     Status: Abnormal   Collection Time: 09/17/18  7:34 PM  Result Value Ref Range   WBC 25.6 (H) 4.0 - 10.5 K/uL     Comment: WHITE COUNT CONFIRMED ON SMEAR   RBC 3.88 3.87 - 5.11 MIL/uL   Hemoglobin 11.0 (L) 12.0 - 15.0 g/dL   HCT 34.6 (L) 36.0 - 46.0 %   MCV 89.2 80.0 - 100.0 fL   MCH 28.4 26.0 - 34.0 pg   MCHC 31.8 30.0 - 36.0 g/dL   RDW 16.9 (H) 11.5 - 15.5 %   Platelets 466 (H) 150 - 400 K/uL   nRBC 0.1 0.0 - 0.2 %   Neutrophils Relative % 83 %   Neutro Abs 21.1 (H) 1.7 - 7.7 K/uL   Lymphocytes Relative 6 %   Lymphs Abs 1.5 0.7 - 4.0 K/uL   Monocytes Relative 7 %   Monocytes Absolute 1.9 (H) 0.1 - 1.0 K/uL   Eosinophils Relative 1 %   Eosinophils Absolute 0.2 0.0 - 0.5 K/uL   Basophils Relative 0 %   Basophils Absolute 0.1 0.0 - 0.1 K/uL   Immature Granulocytes 3 %   Abs Immature Granulocytes 0.77 (H) 0.00 - 0.07 K/uL    Comment: Performed at [facility], [place] [address], [place], [place] [address]  Blood Culture (routine x 2)     Status: None (Preliminary result)   Collection Time: 09/17/18  7:34 PM   Specimen: BLOOD LEFT WRIST  Result Value Ref Range   Specimen Description      BLOOD LEFT WRIST Performed at [place] [address] [place], [place] [address]    Special Requests      BOTTLES DRAWN AEROBIC AND ANAEROBIC Blood Culture results may not be optimal due to an inadequate volume of blood received in culture bottles Performed at [facility], [place] [address] [place],  [address]    Culture PENDING    Report Status PENDING   SARS Coronavirus 2 (CEPHEID- Performed in [facility] hospital lab), Hosp Order     Status: None   Collection Time: 09/17/18  7:35 PM   Specimen: Nasopharyngeal Swab  Result Value Ref Range   SARS Coronavirus 2 NEGATIVE NEGATIVE    Comment: (NOTE) If result is NEGATIVE SARS-CoV-2 target nucleic acids are NOT DETECTED. The SARS-CoV-2 RNA is generally detectable in upper and lower  respiratory specimens during the acute phase of infection. The lowest  concentration of SARS-CoV-2 viral copies this  assay can detect is 250  copies / mL. A negative result does not preclude SARS-CoV-2 infection  and should not be used as the sole basis for treatment or other  patient management decisions.  A negative result may occur with  improper specimen collection / handling, submission of specimen other  than nasopharyngeal swab, presence of viral mutation(s) within the  areas targeted by this assay, and inadequate number of viral copies  (<250 copies / mL). A negative result must be combined with clinical  observations, patient history, and epidemiological information. If result is POSITIVE SARS-CoV-2 target nucleic acids are DETECTED. The SARS-CoV-2 RNA is generally detectable in upper and lower  respiratory specimens dur ing the acute phase of infection.  Positive  results are indicative of active infection with SARS-CoV-2.  Clinical  correlation with patient history and other diagnostic information is  necessary to determine patient infection status.  Positive results do  not rule out bacterial infection or co-infection with other viruses. If result is PRESUMPTIVE POSTIVE SARS-CoV-2 nucleic acids MAY BE PRESENT.   A presumptive positive result was obtained on the submitted specimen  and confirmed on repeat testing.  While 2019 novel coronavirus  (SARS-CoV-2) nucleic acids may be present in the submitted sample  additional confirmatory testing may be necessary for epidemiological  and / or clinical management purposes  to differentiate between  SARS-CoV-2 and other Sarbecovirus currently known to infect humans.  If clinically indicated additional testing with an alternate test  methodology [phone]) is advised. The SARS-CoV-2 RNA is generally  detectable in upper and lower respiratory sp ecimens during the acute  phase of infection. The expected result is Negative. Fact Sheet for Patients:  StrictlyIdeas.no Fact Sheet for Healthcare  Providers: BankingDealers.co.za This test is not yet approved or cleared by the [place] FDA and has been authorized for detection and/or diagnosis of SARS-CoV-2 by FDA under an Emergency Use Authorization (EUA).  This EUA will remain in effect (meaning this test can be used) for the duration of the COVID-19 declaration under Section 564(b)(1) of the Act, 21 U.S.C. section 360bbb-3(b)(1), unless the authorization is terminated or revoked sooner. Performed at [facility], [place] [address], [place], [place] [address]   Blood Culture (routine x 2)     Status: None (Preliminary result)   Collection Time: 09/17/18  7:39 PM   Specimen: BLOOD RIGHT FOREARM  Result Value Ref Range   Specimen Description      BLOOD RIGHT FOREARM Performed at [facility], [place] [address] [place], [place] [address]    Special Requests      Blood Culture results may not be optimal due to an inadequate volume of blood received in culture bottles BOTTLES DRAWN AEROBIC AND ANAEROBIC Performed at [facility], [place] [address] [place], [place] [address]    Culture PENDING    Report Status PENDING    Dg Chest Port 1 View  Result Date: 09/17/2018 CLINICAL DATA:  85 y/o  F; cough, shortness of breath, fever. EXAM: PORTABLE CHEST 1 VIEW COMPARISON:  06/17/2018 chest radiograph FINDINGS: Stable cardiac silhouette given projection and technique. Aortic atherosclerosis with calcification. Left pleural effusion and basilar consolidation. Hazy opacities at periphery of right lower lung. No pneumothorax. No acute osseous abnormality is evident. IMPRESSION: Left pleural effusion and basilar consolidation, probably pneumonia. Electronically Signed   By: Kristine Garbe M.D.   On: 09/17/2018 20:14    Pending Labs Unresulted Labs (From admission, onward)    Start     Ordered  09/18/18 0500  Creatinine, serum  Daily,   R    Comments: While on both  vancomycin and zosyn    09/18/18 0010   09/18/18 0031  MRSA PCR Screening  Once,   STAT     09/18/18 0030   09/17/18 1934  Lactic acid, plasma  Now then every 2 hours,   STAT     09/17/18 1934   09/17/18 1934  Urinalysis, Routine w reflex microscopic  ONCE - STAT,   STAT     09/17/18 1934   09/17/18 1934  Urine culture  ONCE - STAT,   STAT     09/17/18 1934   Signed and Held  CBC  (enoxaparin (LOVENOX)    CrCl >/= 30 ml/min)  Once,   R    Comments: Baseline for enoxaparin therapy IF NOT ALREADY DRAWN.  Notify MD if PLT < 100 K.    Signed and Held   Signed and Held  Creatinine, serum  (enoxaparin (LOVENOX)    CrCl >/= 30 ml/min)  Once,   R    Comments: Baseline for enoxaparin therapy IF NOT ALREADY DRAWN.    Signed and Held   Signed and Held  Creatinine, serum  (enoxaparin (LOVENOX)    CrCl >/= 30 ml/min)  Weekly,   R    Comments: while on enoxaparin therapy    Signed and Held   Signed and Held  Magnesium  Add-on,   R     Signed and Held   Signed and Held  Phosphorus  Add-on,   R     Signed and Held   Signed and Occupational hygienist morning,   R     Signed and Held   Signed and Held  CBC  Tomorrow morning,   R     Signed and Held   Signed and Held  Culture, sputum-assessment  Once,   R    Question:  Patient immune status  Answer:  Immunocompromised   Signed and Held   Signed and Held  Influenza panel by PCR (type A & B)  (Influenza PCR Panel)  Once,   R     Signed and Held   Signed and Held  Expectorated sputum assessment w rflx to resp cult  Once,   R    Question:  Patient immune status  Answer:  Immunocompromised   Signed and Held          Vitals/Pain Today's Vitals   09/17/18 2300 09/17/18 2330 09/18/18 0000 09/18/18 0030  BP: (!) 123/56 (!) 115/54 105/60 106/78  Pulse: 85 82 77 81  Resp: (!) 34 (!) 25 (!) 31 (!) 32  Temp:      TempSrc:      SpO2: 91% 92% 91% 92%  PainSc:        Isolation Precautions Droplet and Contact  precautions  Medications Medications  0.9 %  sodium chloride infusion (1,000 mLs Intravenous New Bag/Given 09/17/18 2035)  piperacillin-tazobactam (ZOSYN) IVPB 3.375 g (3.375 g Intravenous Transfusing/Transfer 09/18/18 0049)  vancomycin (VANCOCIN) 1,250 mg in sodium chloride 0.9 % 250 mL IVPB (has no administration in time range)  vancomycin (VANCOCIN) IVPB 750 mg/150 ml premix (has no administration in time range)  piperacillin-tazobactam (ZOSYN) IVPB 3.375 g (has no administration in time range)    Mobility non-ambulatory

## 2018-09-18 NOTE — Progress Notes (Signed)
-   due to high risk of being some snf, and having + close contact at SNF --> will repeat covid-19 testing (cepheid)

## 2018-09-18 NOTE — Progress Notes (Signed)
After chart review, patient falls into high-risk category as she is a SNF resident and has had contact with another positive person. Symptomatic. Called Dr. Baxter Flattery with IP, advised to place patient back on isolation (airborne and contact) and move to airborne isolation room. Called patient's nurse on unit and relayed information.

## 2018-09-18 NOTE — Progress Notes (Signed)
Pharmacy Antibiotic Note  Grace Gilbert is a 85 y.o. female admitted on 09/17/2018 with pneumonia.  Pharmacy has been consulted for vancomycin and zosyn dosing.  She was given azithromycin and Rocephin in the ED>   Plan: Zosyn 3.375g IV q8h (4 hour infusion).  Vancomycin 1250 mg IV loading dose Vancomycin 750 mg IV Q 24 hrs. Goal AUC 400-550. Expected AUC: 470.6 SCr used: 0.8. Wt 61.2 kg  Daily SCr while on both vancomycin and zosyn F/u renal fxn, WBC, temp, culture data Vancomycin levels as needed   Temp (24hrs), Avg:99.7 F (37.6 C), Min:99 F (37.2 C), Max:100.3 F (37.9 C)  Recent Labs  Lab 09/17/18 1934  WBC 25.6*  CREATININE 0.46  LATICACIDVEN 2.1*    Estimated Creatinine Clearance: 43.6 mL/min (by C-G formula based on SCr of 0.46 mg/dL).    Allergies  Allergen Reactions  . Aricept [Donepezil Hcl] Diarrhea  . Crestor [Rosuvastatin Calcium]     Myalgia  . Lipitor [Atorvastatin Calcium]   . Lovastatin     Myalgia  . Micardis [Telmisartan]     Fatigue  . Sertraline Diarrhea  . Welchol [Colesevelam Hcl]     Constipation  . Zetia [Ezetimibe]     Myalgia  . Zocor [Simvastatin]     Myalgia    Antimicrobials this admission: 6/18 azith x 1 6/18 CTX x 1 6/19 vanc>> 6/19 zosyn >> Dose adjustments this admission:  Microbiology results: 6/18 BCx2: sent 6/18 COVID 19 neg  Thank you for allowing pharmacy to be a part of this patient's care.  Eudelia Bunch, Pharm.D 09/18/2018 12:10 AM

## 2018-09-18 NOTE — ED Notes (Signed)
If flu swab comes back negative it is ok to d/c droplet precautions per Dr Ara Kussmaul. Swab obtained and sent prior to transporting pt to room 1531. Richelle RN aware.

## 2018-09-19 LAB — URINALYSIS, ROUTINE W REFLEX MICROSCOPIC
Bilirubin Urine: NEGATIVE
Glucose, UA: NEGATIVE mg/dL
Ketones, ur: NEGATIVE mg/dL
Nitrite: NEGATIVE
Protein, ur: NEGATIVE mg/dL
Specific Gravity, Urine: 1.018 (ref 1.005–1.030)
pH: 5 (ref 5.0–8.0)

## 2018-09-19 LAB — CBC WITH DIFFERENTIAL/PLATELET
Abs Immature Granulocytes: 1.21 10*3/uL — ABNORMAL HIGH (ref 0.00–0.07)
Basophils Absolute: 0.1 10*3/uL (ref 0.0–0.1)
Basophils Relative: 1 %
Eosinophils Absolute: 0.5 10*3/uL (ref 0.0–0.5)
Eosinophils Relative: 2 %
HCT: 29.4 % — ABNORMAL LOW (ref 36.0–46.0)
Hemoglobin: 9.1 g/dL — ABNORMAL LOW (ref 12.0–15.0)
Immature Granulocytes: 6 %
Lymphocytes Relative: 7 %
Lymphs Abs: 1.5 10*3/uL (ref 0.7–4.0)
MCH: 28 pg (ref 26.0–34.0)
MCHC: 31 g/dL (ref 30.0–36.0)
MCV: 90.5 fL (ref 80.0–100.0)
Monocytes Absolute: 1.5 10*3/uL — ABNORMAL HIGH (ref 0.1–1.0)
Monocytes Relative: 7 %
Neutro Abs: 16.7 10*3/uL — ABNORMAL HIGH (ref 1.7–7.7)
Neutrophils Relative %: 77 %
Platelets: 445 10*3/uL — ABNORMAL HIGH (ref 150–400)
RBC: 3.25 MIL/uL — ABNORMAL LOW (ref 3.87–5.11)
RDW: 16.9 % — ABNORMAL HIGH (ref 11.5–15.5)
WBC: 21.6 10*3/uL — ABNORMAL HIGH (ref 4.0–10.5)
nRBC: 0 % (ref 0.0–0.2)

## 2018-09-19 LAB — C-REACTIVE PROTEIN: CRP: 24.5 mg/dL — ABNORMAL HIGH (ref <1.0)

## 2018-09-19 LAB — RENAL FUNCTION PANEL
Albumin: 2.3 g/dL — ABNORMAL LOW (ref 3.5–5.0)
Anion gap: 8 (ref 5–15)
BUN: 5 mg/dL — ABNORMAL LOW (ref 8–23)
CO2: 25 mmol/L (ref 22–32)
Calcium: 7.5 mg/dL — ABNORMAL LOW (ref 8.9–10.3)
Chloride: 105 mmol/L (ref 98–111)
Creatinine, Ser: 0.46 mg/dL (ref 0.44–1.00)
GFR calc Af Amer: >60 mL/min (ref >60)
GFR calc non Af Amer: >60 mL/min (ref >60)
Glucose, Bld: 128 mg/dL — ABNORMAL HIGH (ref 70–99)
Phosphorus: 1.5 mg/dL — ABNORMAL LOW (ref 2.5–4.6)
Potassium: 3.6 mmol/L (ref 3.5–5.1)
Sodium: 138 mmol/L (ref 135–145)

## 2018-09-19 LAB — D-DIMER, QUANTITATIVE: D-Dimer, Quant: 7.65 ug/mL-FEU — ABNORMAL HIGH (ref 0.00–0.50)

## 2018-09-19 LAB — LACTATE DEHYDROGENASE: LDH: 161 U/L (ref 98–192)

## 2018-09-19 LAB — GLUCOSE, CAPILLARY
Glucose-Capillary: 112 mg/dL — ABNORMAL HIGH (ref 70–99)
Glucose-Capillary: 144 mg/dL — ABNORMAL HIGH (ref 70–99)
Glucose-Capillary: 115 mg/dL — ABNORMAL HIGH (ref 70–99)
Glucose-Capillary: 142 mg/dL — ABNORMAL HIGH (ref 70–99)

## 2018-09-19 LAB — MAGNESIUM: Magnesium: 2.1 mg/dL (ref 1.7–2.4)

## 2018-09-19 LAB — SEDIMENTATION RATE: Sed Rate: 130 mm/hr — ABNORMAL HIGH (ref 0–22)

## 2018-09-19 LAB — PROCALCITONIN: Procalcitonin: 0.11 ng/mL

## 2018-09-19 LAB — FERRITIN: Ferritin: 1214 ng/mL — ABNORMAL HIGH (ref 11–307)

## 2018-09-19 NOTE — Progress Notes (Signed)
PROGRESS NOTE    Grace Gilbert  GUY:[id number] DOB: December 12, 1932 DOA: 09/17/2018 PCP: [name], MD     Brief Narrative:  [name] a 85 y.o.femalewith a known history of Alzheimer's dementia, hypertension, hyperlipidemia, GERD, diabetespresents to the emergency department for evaluation of fever, cough, shortness of breath. Patient was sent to the emergency department by her nursing home because she had developed symptoms of fever, shortness of breath and cough. Chest x-ray done today was read as pneumonia. Her white blood cell count was 24,000. Of note she was tested negative for COVID 2 days ago at her facility.  Patient did have a positive COVID contact, her nurse aide tested positive on 6/10.  Due to patient's high risk history, patient had a repeat COVID test which returned negative.  New events last 24 hours / Subjective: Patient with dementia history.  No new complaints today.  Afebrile overnight  Assessment & Plan:   Active Problems:   HCAP (healthcare-associated pneumonia)  HCAP -Patient with high risk history of COVID due to close contact with nurse tech with tested positive at her nursing facility.  Patient tested negative once at nursing home and twice inpatient.  Checked other COVID testing such as sed rate, ferritin, LDH, procalcitonin, d-dimer.  Patient's high WBC goes against suspected COVID.  However her ferritin was elevated 1214, sed rate elevated 130, d-dimer elevated 7.65.  Discussed with infectious disease.  Due to patient's high risk history, will leave on droplet/contact precaution during her hospitalization -Continue Zosyn.  Vancomycin was discontinued as MRSA screen negative -Blood cultures pending  Acute hypoxemic respiratory failure -Continue nasal cannula O2 to maintain sat greater than 92%.  Currently on 3 L nasal cannula this morning  Dementia -Continue memantine, Exelon  Type 2 diabetes -Sliding scale insulin   DVT prophylaxis:  Lovenox Code Status: Full code Family Communication: None Disposition Plan: Pending improvement in her leukocytosis, oxygen saturation.  Suspect patient may return back to her skilled nursing facility on discharge   Consultants:   ID over the phone  Procedures:   None   Antimicrobials:  Anti-infectives (From admission, onward)   Start     Dose/Rate Route Frequency Ordered Stop   09/19/18 0000  vancomycin (VANCOCIN) IVPB 750 mg/150 ml premix  Status:  Discontinued     750 mg 150 mL/hr over 60 Minutes Intravenous Every 24 hours 09/18/18 0009 09/19/18 0716   09/18/18 0600  piperacillin-tazobactam (ZOSYN) IVPB 3.375 g     3.375 g 12.5 mL/hr over 240 Minutes Intravenous Every 8 hours 09/18/18 0009     09/18/18 0000  piperacillin-tazobactam (ZOSYN) IVPB 3.375 g     3.375 g 100 mL/hr over 30 Minutes Intravenous  Once 09/17/18 2347 09/18/18 0114   09/18/18 0000  vancomycin (VANCOCIN) 1,250 mg in sodium chloride 0.9 % 250 mL IVPB     1,250 mg 166.7 mL/hr over 90 Minutes Intravenous  Once 09/17/18 2347 09/18/18 0259   09/17/18 1945  cefTRIAXone (ROCEPHIN) 2 g in sodium chloride 0.9 % 100 mL IVPB  Status:  Discontinued     2 g 200 mL/hr over 30 Minutes Intravenous Every 24 hours 09/17/18 1944 09/18/18 0008   09/17/18 1945  azithromycin (ZITHROMAX) 500 mg in sodium chloride 0.9 % 250 mL IVPB  Status:  Discontinued     500 mg 250 mL/hr over 60 Minutes Intravenous Every 24 hours 09/17/18 1944 09/18/18 0008       Objective: Vitals:   09/18/18 5638 09/18/18 1350 09/18/18 2041 09/19/18 0449  BP: 132/62 139/66 (!) 152/70 (!) 144/64  Pulse: 75 68 72 71  Resp: (!) 22 20 20 20   Temp: 97.6 F (36.4 C) (!) 97.4 F (36.3 C) 98.5 F (36.9 C) 98 F (36.7 C)  TempSrc: Oral Axillary Axillary Oral  SpO2: 90% 98% 100% 97%  Weight:      Height:        Intake/Output Summary (Last 24 hours) at 09/19/2018 1145 Last data filed at 09/19/2018 0959 Gross per 24 hour  Intake 1295.7 ml  Output 300  ml  Net 995.7 ml   Filed Weights   09/18/18 0120  Weight: 60 kg    Examination:  General exam: Appears calm and comfortable  Respiratory system: Respiratory effort normal. 3L [place] O2 Cardiovascular system: S1 & S2 heard, RRR. No JVD, murmurs, rubs, gallops or clicks. No pedal edema. Gastrointestinal system: Abdomen is nondistended, soft and nontender. No organomegaly or masses felt. Normal bowel sounds heard. Central nervous system: Alert, oriented to self only  Extremities: Symmetric Skin: No rashes, lesions or ulcers Psychiatry: Dementia  Data Reviewed: I have personally reviewed following labs and imaging studies  CBC: Recent Labs  Lab 09/17/18 1934 09/18/18 0120 09/19/18 0427  WBC 25.6* 23.6* 21.6*  NEUTROABS 21.1*  --  16.7*  HGB 11.0* 9.8* 9.1*  HCT 34.6* 30.4* 29.4*  MCV 89.2 89.9 90.5  PLT 466* 392 245*   Basic Metabolic Panel: Recent Labs  Lab 09/17/18 1934 09/18/18 0120 09/18/18 0500 09/19/18 0427  NA 134*  --  135 138  K 3.4*  --  2.9* 3.6  CL 95*  --  99 105  CO2 27  --  25 25  GLUCOSE 151*  --  150* 128*  BUN 9  --  7* 5*  CREATININE 0.46 0.51 0.49 0.46  CALCIUM 8.7*  --  7.7* 7.5*  MG  --  1.9  --  2.1  PHOS  --  1.7*  --  1.5*   GFR: Estimated Creatinine Clearance: 43.2 mL/min (by C-G formula based on SCr of 0.46 mg/dL). Liver Function Tests: Recent Labs  Lab 09/17/18 1934 09/19/18 0427  AST 48*  --   ALT 29  --   ALKPHOS 168*  --   BILITOT 1.3*  --   PROT 8.0  --   ALBUMIN 2.7* 2.3*   No results for input(s): LIPASE, AMYLASE in the last 168 hours. Recent Labs  Lab 09/18/18 1010  AMMONIA 24   Coagulation Profile: No results for input(s): INR, PROTIME in the last 168 hours. Cardiac Enzymes: No results for input(s): CKTOTAL, CKMB, CKMBINDEX, TROPONINI in the last 168 hours. BNP (last 3 results) No results for input(s): PROBNP in the last 8760 hours. HbA1C: No results for input(s): HGBA1C in the last 72 hours. CBG: Recent Labs   Lab 09/18/18 1119 09/18/18 1611 09/18/18 2037 09/19/18 0731 09/19/18 1121  GLUCAP 118* 107* 102* 112* 144*   Lipid Profile: No results for input(s): CHOL, HDL, LDLCALC, TRIG, CHOLHDL, LDLDIRECT in the last 72 hours. Thyroid Function Tests: No results for input(s): TSH, T4TOTAL, FREET4, T3FREE, THYROIDAB in the last 72 hours. Anemia Panel: Recent Labs    09/19/18 0813  FERRITIN 1,214*   Sepsis Labs: Recent Labs  Lab 09/17/18 1934 09/17/18 2134 09/19/18 0928  PROCALCITON  --   --  0.11  LATICACIDVEN 2.1* 1.3  --     Recent Results (from the past 240 hour(s))  Blood Culture (routine x 2)     Status: None (Preliminary  result)   Collection Time: 09/17/18  7:34 PM   Specimen: BLOOD LEFT WRIST  Result Value Ref Range Status   Specimen Description   Final    BLOOD LEFT WRIST Performed at [facility], [address], [place], [place] [address]    Special Requests   Final    BOTTLES DRAWN AEROBIC AND ANAEROBIC Blood Culture results may not be optimal due to an inadequate volume of blood received in culture bottles Performed at [facility] [address], [place], [place] [address]    Culture   Final    NO GROWTH < 12 HOURS Performed at [facility] [address], [place], [place] [address]    Report Status PENDING  Incomplete  SARS Coronavirus 2 (CEPHEID- Performed in [place] hospital lab), Hosp Order     Status: None   Collection Time: 09/17/18  7:35 PM   Specimen: Nasopharyngeal Swab  Result Value Ref Range Status   SARS Coronavirus 2 NEGATIVE NEGATIVE Final    Comment: (NOTE) If result is NEGATIVE SARS-CoV-2 target nucleic acids are NOT DETECTED. The SARS-CoV-2 RNA is generally detectable in upper and lower  respiratory specimens during the acute phase of infection. The lowest  concentration of SARS-CoV-2 viral copies this assay can detect is 250  copies / mL. A negative result does not preclude SARS-CoV-2 infection  and  should not be used as the sole basis for treatment or other  patient management decisions.  A negative result may occur with  improper specimen collection / handling, submission of specimen other  than nasopharyngeal swab, presence of viral mutation(s) within the  areas targeted by this assay, and inadequate number of viral copies  (<250 copies / mL). A negative result must be combined with clinical  observations, patient history, and epidemiological information. If result is POSITIVE SARS-CoV-2 target nucleic acids are DETECTED. The SARS-CoV-2 RNA is generally detectable in upper and lower  respiratory specimens dur ing the acute phase of infection.  Positive  results are indicative of active infection with SARS-CoV-2.  Clinical  correlation with patient history and other diagnostic information is  necessary to determine patient infection status.  Positive results do  not rule out bacterial infection or co-infection with other viruses. If result is PRESUMPTIVE POSTIVE SARS-CoV-2 nucleic acids MAY BE PRESENT.   A presumptive positive result was obtained on the submitted specimen  and confirmed on repeat testing.  While 2019 novel coronavirus  (SARS-CoV-2) nucleic acids may be present in the submitted sample  additional confirmatory testing may be necessary for epidemiological  and / or clinical management purposes  to differentiate between  SARS-CoV-2 and other Sarbecovirus currently known to infect humans.  If clinically indicated additional testing with an alternate test  methodology [phone]) is advised. The SARS-CoV-2 RNA is generally  detectable in upper and lower respiratory sp ecimens during the acute  phase of infection. The expected result is Negative. Fact Sheet for Patients:  StrictlyIdeas.no Fact Sheet for Healthcare Providers: BankingDealers.co.za This test is not yet approved or cleared by the [place] FDA and has been  authorized for detection and/or diagnosis of SARS-CoV-2 by FDA under an Emergency Use Authorization (EUA).  This EUA will remain in effect (meaning this test can be used) for the duration of the COVID-19 declaration under Section 564(b)(1) of the Act, 21 U.S.C. section 360bbb-3(b)(1), unless the authorization is terminated or revoked sooner. Performed at [facility], [address] [place], [place]  [address]   Blood Culture (routine x 2)     Status: None (Preliminary result)   Collection Time: 09/17/18  7:39 PM   Specimen: BLOOD RIGHT FOREARM  Result Value Ref Range Status   Specimen Description   Final    BLOOD RIGHT FOREARM Performed at [facility], [place] [address] [place], [place] [address]    Special Requests   Final    Blood Culture results may not be optimal due to an inadequate volume of blood received in culture bottles BOTTLES DRAWN AEROBIC AND ANAEROBIC Performed at [facility], [place] [address], [place], [place] [address]    Culture   Final    NO GROWTH < 12 HOURS Performed at [facility] [address], [place], [place] [address]    Report Status PENDING  Incomplete  MRSA PCR Screening     Status: None   Collection Time: 09/18/18 10:45 AM   Specimen: Nasopharyngeal  Result Value Ref Range Status   MRSA by PCR NEGATIVE NEGATIVE Final    Comment:        The GeneXpert MRSA Assay (FDA approved for NASAL specimens only), is one component of a comprehensive MRSA colonization surveillance program. It is not intended to diagnose MRSA infection nor to guide or monitor treatment for MRSA infections. Performed at [facility], [place] [address], [place], [place] [address]   SARS Coronavirus 2 (CEPHEID - Performed in [place] hospital lab), Hosp Order     Status: None   Collection Time: 09/18/18  1:01 PM   Specimen: Nasopharyngeal Swab  Result Value Ref Range Status   SARS Coronavirus 2 NEGATIVE  NEGATIVE Final    Comment: (NOTE) If result is NEGATIVE SARS-CoV-2 target nucleic acids are NOT DETECTED. The SARS-CoV-2 RNA is generally detectable in upper and lower  respiratory specimens during the acute phase of infection. The lowest  concentration of SARS-CoV-2 viral copies this assay can detect is 250  copies / mL. A negative result does not preclude SARS-CoV-2 infection  and should not be used as the sole basis for treatment or other  patient management decisions.  A negative result may occur with  improper specimen collection / handling, submission of specimen other  than nasopharyngeal swab, presence of viral mutation(s) within the  areas targeted by this assay, and inadequate number of viral copies  (<250 copies / mL). A negative result must be combined with clinical  observations, patient history, and epidemiological information. If result is POSITIVE SARS-CoV-2 target nucleic acids are DETECTED. The SARS-CoV-2 RNA is generally detectable in upper and lower  respiratory specimens dur ing the acute phase of infection.  Positive  results are indicative of active infection with SARS-CoV-2.  Clinical  correlation with patient history and other diagnostic information is  necessary to determine patient infection status.  Positive results do  not rule out bacterial infection or co-infection with other viruses. If result is PRESUMPTIVE POSTIVE SARS-CoV-2 nucleic acids MAY BE PRESENT.   A presumptive positive result was obtained on the submitted specimen  and confirmed on repeat testing.  While 2019 novel coronavirus  (SARS-CoV-2) nucleic acids may be present in the submitted sample  additional confirmatory testing may be necessary for epidemiological  and / or clinical management purposes  to differentiate between  SARS-CoV-2 and other Sarbecovirus currently known to infect humans.  If clinically indicated additional testing with an alternate test  methodology [phone]) is  advised. The SARS-CoV-2 RNA is generally  detectable in upper and lower respiratory sp ecimens during the acute  phase of infection. The expected result is Negative. Fact Sheet for Patients:  StrictlyIdeas.no Fact Sheet for Healthcare Providers: BankingDealers.co.za This test is not yet approved or cleared by the [place] FDA and has been authorized for detection and/or diagnosis of SARS-CoV-2 by FDA under an Emergency Use Authorization (EUA).  This EUA will remain in effect (meaning this test can be used) for the duration of the COVID-19 declaration under Section 564(b)(1) of the Act, 21 U.S.C. section 360bbb-3(b)(1), unless the authorization is terminated or revoked sooner. Performed at [facility], [place] [address] [place], [place] [address]        Radiology Studies: Dg Chest Port 1 View  Result Date: 09/17/2018 CLINICAL DATA:  85 y/o  F; cough, shortness of breath, fever. EXAM: PORTABLE CHEST 1 VIEW COMPARISON:  06/17/2018 chest radiograph FINDINGS: Stable cardiac silhouette given projection and technique. Aortic atherosclerosis with calcification. Left pleural effusion and basilar consolidation. Hazy opacities at periphery of right lower lung. No pneumothorax. No acute osseous abnormality is evident. IMPRESSION: Left pleural effusion and basilar consolidation, probably pneumonia. Electronically Signed   By: Kristine Garbe M.D.   On: 09/17/2018 20:14      Scheduled Meds: . aspirin EC  81 mg Oral Daily  . calcium-vitamin D  1 tablet Oral Q breakfast  . enoxaparin (LOVENOX) injection  40 mg Subcutaneous Q24H  . insulin aspart  0-5 Units Subcutaneous QHS  . insulin aspart  0-9 Units Subcutaneous TID WC  . loratadine  10 mg Oral Daily  . memantine  28 mg Oral Daily  . omega-3 acid ethyl esters  1 g Oral Daily  . rivastigmine  9.5 mg Transdermal Daily   Continuous Infusions: .  piperacillin-tazobactam (ZOSYN)  IV 3.375 g (09/19/18 0533)     LOS: 2 days    Time spent: 35 minutes   Dessa Phi, DO [facility] [email or web address] 09/19/2018, 11:45 AM

## 2018-09-19 NOTE — Progress Notes (Signed)
Pharmacy Antibiotic Note  Grace Gilbert is a 84 y.o. female admitted on 09/17/2018 with pneumonia.  Pharmacy has been consulted for  zosyn dosing.    This is day #2 of full dose IV antibiotics - pt on vancomycin + piperacillin/tazobactam for PNA. Received verbal order to discontinue vancomycin given negative MRSA PCR. Pt undergoing repeat COVID-19 testing.  Today, 09/19/18  WBC 21.6 - remains elevated  SCr - WNL. CrCL ~43 mL/min  Afebrile  Plan:  Continue piperacillin/tazobactam 3.375 g IV q8h EI  Renal function has been stable for several days. Vancomycin discontinued. Do not anticipate any dose changes needed - pharmacy to sign off and follow dosing peripherally. Please re-consult if needed.   Height: 5' (152.4 cm) Weight: 132 lb 4.4 oz (60 kg) IBW/kg (Calculated) : 45.5  Temp (24hrs), Avg:98 F (36.7 C), Min:97.4 F (36.3 C), Max:98.5 F (36.9 C)  Recent Labs  Lab 09/17/18 1934 09/17/18 2134 09/18/18 0120 09/18/18 0500 09/19/18 0427  WBC 25.6*  --  23.6*  --  21.6*  CREATININE 0.46  --  0.51 0.49 0.46  LATICACIDVEN 2.1* 1.3  --   --   --     Estimated Creatinine Clearance: 43.2 mL/min (by C-G formula based on SCr of 0.46 mg/dL).    Allergies  Allergen Reactions  . Aricept [Donepezil Hcl] Diarrhea  . Crestor [Rosuvastatin Calcium]     Myalgia  . Lipitor [Atorvastatin Calcium]   . Lovastatin     Myalgia  . Micardis [Telmisartan]     Fatigue  . Sertraline Diarrhea  . Welchol [Colesevelam Hcl]     Constipation  . Zetia [Ezetimibe]     Myalgia  . Zocor [Simvastatin]     Myalgia    Antimicrobials this admission: 6/18 azith x 1 6/18 CTX x 1 6/19 vanc>> 6/20 6/19 zosyn >>  Dose adjustments this admission:  Microbiology results: 6/18 BCx2: ngtd 6/18 COVID 19 neg 6/19 COVID 19: Negative 6/20 Urine: Sent 6/19 MRSA PCR: Negative  Thank you for allowing pharmacy to be a part of this patient's care.  Lenis Noon, PharmD 09/19/18 7:21 AM

## 2018-09-20 LAB — GLUCOSE, CAPILLARY
Glucose-Capillary: 134 mg/dL — ABNORMAL HIGH (ref 70–99)
Glucose-Capillary: 118 mg/dL — ABNORMAL HIGH (ref 70–99)
Glucose-Capillary: 98 mg/dL (ref 70–99)
Glucose-Capillary: 111 mg/dL — ABNORMAL HIGH (ref 70–99)

## 2018-09-20 LAB — PROCALCITONIN: Procalcitonin: 0.1 ng/mL

## 2018-09-20 LAB — BASIC METABOLIC PANEL
Anion gap: 10 (ref 5–15)
BUN: <5 mg/dL — ABNORMAL LOW (ref 8–23)
CO2: 25 mmol/L (ref 22–32)
Calcium: 6.9 mg/dL — ABNORMAL LOW (ref 8.9–10.3)
Chloride: 92 mmol/L — ABNORMAL LOW (ref 98–111)
Creatinine, Ser: 0.45 mg/dL (ref 0.44–1.00)
GFR calc Af Amer: >60 mL/min (ref >60)
GFR calc non Af Amer: >60 mL/min (ref >60)
Glucose, Bld: 135 mg/dL — ABNORMAL HIGH (ref 70–99)
Potassium: 2.9 mmol/L — ABNORMAL LOW (ref 3.5–5.1)
Sodium: 127 mmol/L — ABNORMAL LOW (ref 135–145)

## 2018-09-20 LAB — CBC
HCT: 30.1 % — ABNORMAL LOW (ref 36.0–46.0)
Hemoglobin: 9.3 g/dL — ABNORMAL LOW (ref 12.0–15.0)
MCH: 27.8 pg (ref 26.0–34.0)
MCHC: 30.9 g/dL (ref 30.0–36.0)
MCV: 90.1 fL (ref 80.0–100.0)
Platelets: 464 10*3/uL — ABNORMAL HIGH (ref 150–400)
RBC: 3.34 MIL/uL — ABNORMAL LOW (ref 3.87–5.11)
RDW: 16.9 % — ABNORMAL HIGH (ref 11.5–15.5)
WBC: 24.6 10*3/uL — ABNORMAL HIGH (ref 4.0–10.5)
nRBC: 0.1 % (ref 0.0–0.2)

## 2018-09-20 LAB — URINE CULTURE: Culture: NO GROWTH

## 2018-09-20 MED ORDER — POTASSIUM CHLORIDE IN NACL 40-0.9 MEQ/L-% IV SOLN
INTRAVENOUS | Status: DC
  Administered 2018-09-20 – 2018-09-21 (×2): 75 mL/h via INTRAVENOUS
  Filled 2018-09-20 (×2): qty 1000

## 2018-09-20 NOTE — Progress Notes (Addendum)
PROGRESS NOTE    Grace Gilbert  IEP:[id number] DOB: 05-01-1932 DOA: 09/17/2018 PCP: [name], MD     Brief Narrative:  [name] a 85 y.o.femalewith a known history of Alzheimer's dementia, hypertension, hyperlipidemia, GERD, diabetespresents to the emergency department for evaluation of fever, cough, shortness of breath. Patient was sent to the emergency department by her nursing home because she had developed symptoms of fever, shortness of breath and cough. Chest x-ray done today was read as pneumonia. Her white blood cell count was 24,000. Of note she was tested negative for COVID 2 days ago at her facility.  Patient did have a positive COVID contact, her nurse aide tested positive on 6/10.  Due to patient's high risk history, patient had a repeat COVID test which returned negative.  New events last 24 hours / Subjective: Patient remains confused, baseline mentation. No new complaints of worsening SOB or pain today.   Assessment & Plan:   Active Problems:   HCAP (healthcare-associated pneumonia)  HCAP -Patient with high risk history of COVID due to close contact with nurse tech with tested positive at her nursing facility.  Patient tested negative once at nursing home and twice inpatient.  Checked other COVID testing such as sed rate, ferritin, LDH, procalcitonin, d-dimer.  Patient's high WBC goes against suspected COVID.  However her ferritin was elevated 1214, sed rate elevated 130, d-dimer elevated 7.65.  Discussed with infectious disease.  Due to patient's high risk history, will leave on droplet/contact precaution during her hospitalization -Continue Zosyn.  Vancomycin was discontinued as MRSA screen negative -Blood cultures negative to date  Acute hypoxemic respiratory failure -Continue nasal cannula O2 to maintain sat greater than 92%.  Currently on 3 L nasal cannula this morning, wean oxygen as able  Hyponatremia -Start IV fluids  Hypokalemia -Replace,  trend  Dementia -Continue memantine, Exelon  Type 2 diabetes -Sliding scale insulin   DVT prophylaxis: Lovenox Code Status: Full code Family Communication: Spoke with legal guardian who is her first cousin over the phone  Disposition Plan: Pending improvement in her leukocytosis, oxygen saturation.  Suspect patient may return back to her skilled nursing facility on discharge.  IV fluids started today, continue to wean oxygen as able   Consultants:   ID over the phone  Procedures:   None   Antimicrobials:  Anti-infectives (From admission, onward)   Start     Dose/Rate Route Frequency Ordered Stop   09/19/18 0000  vancomycin (VANCOCIN) IVPB 750 mg/150 ml premix  Status:  Discontinued     750 mg 150 mL/hr over 60 Minutes Intravenous Every 24 hours 09/18/18 0009 09/19/18 0716   09/18/18 0600  piperacillin-tazobactam (ZOSYN) IVPB 3.375 g     3.375 g 12.5 mL/hr over 240 Minutes Intravenous Every 8 hours 09/18/18 0009     09/18/18 0000  piperacillin-tazobactam (ZOSYN) IVPB 3.375 g     3.375 g 100 mL/hr over 30 Minutes Intravenous  Once 09/17/18 2347 09/18/18 0114   09/18/18 0000  vancomycin (VANCOCIN) 1,250 mg in sodium chloride 0.9 % 250 mL IVPB     1,250 mg 166.7 mL/hr over 90 Minutes Intravenous  Once 09/17/18 2347 09/18/18 0259   09/17/18 1945  cefTRIAXone (ROCEPHIN) 2 g in sodium chloride 0.9 % 100 mL IVPB  Status:  Discontinued     2 g 200 mL/hr over 30 Minutes Intravenous Every 24 hours 09/17/18 1944 09/18/18 0008   09/17/18 1945  azithromycin (ZITHROMAX) 500 mg in sodium chloride 0.9 % 250 mL IVPB  Status:  Discontinued     500 mg 250 mL/hr over 60 Minutes Intravenous Every 24 hours 09/17/18 1944 09/18/18 0008       Objective: Vitals:   09/19/18 0449 09/19/18 1336 09/19/18 2133 09/20/18 0543  BP: (!) 144/64 137/69 (!) 155/65 (!) 139/56  Pulse: 71 65 72 69  Resp: 20 17  16   Temp: 98 F (36.7 C) 98 F (36.7 C) 98.1 F (36.7 C) 98.2 F (36.8 C)  TempSrc: Oral  Oral Oral Oral  SpO2: 97% 94% 95% 92%  Weight:      Height:        Intake/Output Summary (Last 24 hours) at 09/20/2018 1043 Last data filed at 09/20/2018 0600 Gross per 24 hour  Intake 133.53 ml  Output 1400 ml  Net -1266.47 ml   Filed Weights   09/18/18 0120  Weight: 60 kg    Examination: General exam: Appears calm and comfortable  Respiratory system: Clear to auscultation. Respiratory effort normal.  On 3 L nasal cannula O2 Cardiovascular system: S1 & S2 heard, RRR. No JVD, murmurs, rubs, gallops or clicks. No pedal edema. Gastrointestinal system: Abdomen is nondistended, soft and nontender. No organomegaly or masses felt. Normal bowel sounds heard. Central nervous system: Alert and not oriented to place or time Extremities: Symmetric  Skin: No rashes, lesions or ulcers Psychiatry: Dementia  Data Reviewed: I have personally reviewed following labs and imaging studies  CBC: Recent Labs  Lab 09/17/18 1934 09/18/18 0120 09/19/18 0427 09/20/18 0338  WBC 25.6* 23.6* 21.6* 24.6*  NEUTROABS 21.1*  --  16.7*  --   HGB 11.0* 9.8* 9.1* 9.3*  HCT 34.6* 30.4* 29.4* 30.1*  MCV 89.2 89.9 90.5 90.1  PLT 466* 392 445* 272*   Basic Metabolic Panel: Recent Labs  Lab 09/17/18 1934 09/18/18 0120 09/18/18 0500 09/19/18 0427 09/20/18 0338  NA 134*  --  135 138 127*  K 3.4*  --  2.9* 3.6 2.9*  CL 95*  --  99 105 92*  CO2 27  --  25 25 25   GLUCOSE 151*  --  150* 128* 135*  BUN 9  --  7* 5* <5*  CREATININE 0.46 0.51 0.49 0.46 0.45  CALCIUM 8.7*  --  7.7* 7.5* 6.9*  MG  --  1.9  --  2.1  --   PHOS  --  1.7*  --  1.5*  --    GFR: Estimated Creatinine Clearance: 43.2 mL/min (by C-G formula based on SCr of 0.45 mg/dL). Liver Function Tests: Recent Labs  Lab 09/17/18 1934 09/19/18 0427  AST 48*  --   ALT 29  --   ALKPHOS 168*  --   BILITOT 1.3*  --   PROT 8.0  --   ALBUMIN 2.7* 2.3*   No results for input(s): LIPASE, AMYLASE in the last 168 hours. Recent Labs  Lab  09/18/18 1010  AMMONIA 24   Coagulation Profile: No results for input(s): INR, PROTIME in the last 168 hours. Cardiac Enzymes: No results for input(s): CKTOTAL, CKMB, CKMBINDEX, TROPONINI in the last 168 hours. BNP (last 3 results) No results for input(s): PROBNP in the last 8760 hours. HbA1C: No results for input(s): HGBA1C in the last 72 hours. CBG: Recent Labs  Lab 09/19/18 0731 09/19/18 1121 09/19/18 1621 09/19/18 2130 09/20/18 0730  GLUCAP 112* 144* 115* 142* 134*   Lipid Profile: No results for input(s): CHOL, HDL, LDLCALC, TRIG, CHOLHDL, LDLDIRECT in the last 72 hours. Thyroid Function Tests: No results for input(s):  TSH, T4TOTAL, FREET4, T3FREE, THYROIDAB in the last 72 hours. Anemia Panel: Recent Labs    09/19/18 0813  FERRITIN 1,214*   Sepsis Labs: Recent Labs  Lab 09/17/18 1934 09/17/18 2134 09/19/18 0928 09/20/18 0338  PROCALCITON  --   --  0.11 0.10  LATICACIDVEN 2.1* 1.3  --   --     Recent Results (from the past 240 hour(s))  Blood Culture (routine x 2)     Status: None (Preliminary result)   Collection Time: 09/17/18  7:34 PM   Specimen: BLOOD LEFT WRIST  Result Value Ref Range Status   Specimen Description   Final    BLOOD LEFT WRIST Performed at [facility] [address] [place], [place] [address]    Special Requests   Final    BOTTLES DRAWN AEROBIC AND ANAEROBIC Blood Culture results may not be optimal due to an inadequate volume of blood received in culture bottles Performed at [facility] [address], [place], [place] [address]    Culture   Final    NO GROWTH 2 DAYS Performed at [facility] [address], [place], [place] [address]    Report Status PENDING  Incomplete  SARS Coronavirus 2 (CEPHEID- Performed in [facility] hospital lab), Hosp Order     Status: None   Collection Time: 09/17/18  7:35 PM   Specimen: Nasopharyngeal Swab  Result Value Ref Range Status   SARS Coronavirus 2  NEGATIVE NEGATIVE Final    Comment: (NOTE) If result is NEGATIVE SARS-CoV-2 target nucleic acids are NOT DETECTED. The SARS-CoV-2 RNA is generally detectable in upper and lower  respiratory specimens during the acute phase of infection. The lowest  concentration of SARS-CoV-2 viral copies this assay can detect is 250  copies / mL. A negative result does not preclude SARS-CoV-2 infection  and should not be used as the sole basis for treatment or other  patient management decisions.  A negative result may occur with  improper specimen collection / handling, submission of specimen other  than nasopharyngeal swab, presence of viral mutation(s) within the  areas targeted by this assay, and inadequate number of viral copies  (<250 copies / mL). A negative result must be combined with clinical  observations, patient history, and epidemiological information. If result is POSITIVE SARS-CoV-2 target nucleic acids are DETECTED. The SARS-CoV-2 RNA is generally detectable in upper and lower  respiratory specimens dur ing the acute phase of infection.  Positive  results are indicative of active infection with SARS-CoV-2.  Clinical  correlation with patient history and other diagnostic information is  necessary to determine patient infection status.  Positive results do  not rule out bacterial infection or co-infection with other viruses. If result is PRESUMPTIVE POSTIVE SARS-CoV-2 nucleic acids MAY BE PRESENT.   A presumptive positive result was obtained on the submitted specimen  and confirmed on repeat testing.  While 2019 novel coronavirus  (SARS-CoV-2) nucleic acids may be present in the submitted sample  additional confirmatory testing may be necessary for epidemiological  and / or clinical management purposes  to differentiate between  SARS-CoV-2 and other Sarbecovirus currently known to infect humans.  If clinically indicated additional testing with an alternate test  methodology [phone])  is advised. The SARS-CoV-2 RNA is generally  detectable in upper and lower respiratory sp ecimens during the acute  phase of infection. The expected result is Negative. Fact Sheet for Patients:  StrictlyIdeas.no Fact Sheet for Healthcare Providers: BankingDealers.co.za This  test is not yet approved or cleared by the [place] and has been authorized for detection and/or diagnosis of SARS-CoV-2 by FDA under an Emergency Use Authorization (EUA).  This EUA will remain in effect (meaning this test can be used) for the duration of the COVID-19 declaration under Section 564(b)(1) of the Act, 21 U.S.C. section 360bbb-3(b)(1), unless the authorization is terminated or revoked sooner. Performed at [facility], [place] [address] [place], [place] [address]   Blood Culture (routine x 2)     Status: None (Preliminary result)   Collection Time: 09/17/18  7:39 PM   Specimen: BLOOD RIGHT FOREARM  Result Value Ref Range Status   Specimen Description   Final    BLOOD RIGHT FOREARM Performed at [facility], [place] [address] [place], [place] [address]    Special Requests   Final    Blood Culture results may not be optimal due to an inadequate volume of blood received in culture bottles BOTTLES DRAWN AEROBIC AND ANAEROBIC Performed at [facility], [place] [address] [place], [place] [address]    Culture   Final    NO GROWTH 2 DAYS Performed at [facility] [address], [place], [place] [address]    Report Status PENDING  Incomplete  MRSA PCR Screening     Status: None   Collection Time: 09/18/18 10:45 AM   Specimen: Nasopharyngeal  Result Value Ref Range Status   MRSA by PCR NEGATIVE NEGATIVE Final    Comment:        The GeneXpert MRSA Assay (FDA approved for NASAL specimens only), is one component of a comprehensive MRSA colonization surveillance program. It is not intended to diagnose  MRSA infection nor to guide or monitor treatment for MRSA infections. Performed at [facility], [place] [address], [place], [place] [address]   SARS Coronavirus 2 (CEPHEID - Performed in [facility] hospital lab), Hosp Order     Status: None   Collection Time: 09/18/18  1:01 PM   Specimen: Nasopharyngeal Swab  Result Value Ref Range Status   SARS Coronavirus 2 NEGATIVE NEGATIVE Final    Comment: (NOTE) If result is NEGATIVE SARS-CoV-2 target nucleic acids are NOT DETECTED. The SARS-CoV-2 RNA is generally detectable in upper and lower  respiratory specimens during the acute phase of infection. The lowest  concentration of SARS-CoV-2 viral copies this assay can detect is 250  copies / mL. A negative result does not preclude SARS-CoV-2 infection  and should not be used as the sole basis for treatment or other  patient management decisions.  A negative result may occur with  improper specimen collection / handling, submission of specimen other  than nasopharyngeal swab, presence of viral mutation(s) within the  areas targeted by this assay, and inadequate number of viral copies  (<250 copies / mL). A negative result must be combined with clinical  observations, patient history, and epidemiological information. If result is POSITIVE SARS-CoV-2 target nucleic acids are DETECTED. The SARS-CoV-2 RNA is generally detectable in upper and lower  respiratory specimens dur ing the acute phase of infection.  Positive  results are indicative of active infection with SARS-CoV-2.  Clinical  correlation with patient history and other diagnostic information is  necessary to determine patient infection status.  Positive results do  not rule out bacterial infection or co-infection with other viruses. If result is PRESUMPTIVE POSTIVE SARS-CoV-2 nucleic acids MAY BE PRESENT.   A presumptive positive result was  obtained on the submitted specimen  and confirmed on repeat testing.  While  2019 novel coronavirus  (SARS-CoV-2) nucleic acids may be present in the submitted sample  additional confirmatory testing may be necessary for epidemiological  and / or clinical management purposes  to differentiate between  SARS-CoV-2 and other Sarbecovirus currently known to infect humans.  If clinically indicated additional testing with an alternate test  methodology [phone]) is advised. The SARS-CoV-2 RNA is generally  detectable in upper and lower respiratory sp ecimens during the acute  phase of infection. The expected result is Negative. Fact Sheet for Patients:  StrictlyIdeas.no Fact Sheet for Healthcare Providers: BankingDealers.co.za This test is not yet approved or cleared by the [place] FDA and has been authorized for detection and/or diagnosis of SARS-CoV-2 by FDA under an Emergency Use Authorization (EUA).  This EUA will remain in effect (meaning this test can be used) for the duration of the COVID-19 declaration under Section 564(b)(1) of the Act, 21 U.S.C. section 360bbb-3(b)(1), unless the authorization is terminated or revoked sooner. Performed at [facility], [place] [address], [place], [place] [address]   Urine culture     Status: None   Collection Time: 09/19/18  6:07 AM   Specimen: Urine, Clean Catch  Result Value Ref Range Status   Specimen Description   Final    URINE, CLEAN CATCH Performed at [facility], [place] [address], [place], [place] [address]    Special Requests   Final    NONE Performed at [facility], [place] [address], [place], [place] [address]    Culture   Final    NO GROWTH Performed at [facility], [place] [address], [place],  [address]    Report Status 09/20/2018 FINAL  Final       Radiology Studies: No results found.    Scheduled Meds: . aspirin EC  81 mg Oral Daily  . calcium-vitamin D  1 tablet Oral Q  breakfast  . enoxaparin (LOVENOX) injection  40 mg Subcutaneous Q24H  . insulin aspart  0-5 Units Subcutaneous QHS  . insulin aspart  0-9 Units Subcutaneous TID WC  . loratadine  10 mg Oral Daily  . memantine  28 mg Oral Daily  . omega-3 acid ethyl esters  1 g Oral Daily  . rivastigmine  9.5 mg Transdermal Daily   Continuous Infusions: . 0.9 % NaCl with KCl 40 mEq / L 75 mL/hr (09/20/18 0928)  . piperacillin-tazobactam (ZOSYN)  IV 3.375 g (09/20/18 0314)     LOS: 3 days    Time spent: 25 minutes   Dessa Phi, DO [facility] [email or web address] 09/20/2018, 10:43 AM

## 2018-09-21 ENCOUNTER — Inpatient Hospital Stay (HOSPITAL_COMMUNITY): Payer: Medicare Other

## 2018-09-21 LAB — GLUCOSE, CAPILLARY
Glucose-Capillary: 102 mg/dL — ABNORMAL HIGH (ref 70–99)
Glucose-Capillary: 121 mg/dL — ABNORMAL HIGH (ref 70–99)
Glucose-Capillary: 115 mg/dL — ABNORMAL HIGH (ref 70–99)
Glucose-Capillary: 105 mg/dL — ABNORMAL HIGH (ref 70–99)

## 2018-09-21 LAB — BASIC METABOLIC PANEL
Anion gap: 9 (ref 5–15)
BUN: <5 mg/dL — ABNORMAL LOW (ref 8–23)
CO2: 25 mmol/L (ref 22–32)
Calcium: 7.5 mg/dL — ABNORMAL LOW (ref 8.9–10.3)
Chloride: 103 mmol/L (ref 98–111)
Creatinine, Ser: 0.41 mg/dL — ABNORMAL LOW (ref 0.44–1.00)
GFR calc Af Amer: >60 mL/min (ref >60)
GFR calc non Af Amer: >60 mL/min (ref >60)
Glucose, Bld: 113 mg/dL — ABNORMAL HIGH (ref 70–99)
Potassium: 3.2 mmol/L — ABNORMAL LOW (ref 3.5–5.1)
Sodium: 137 mmol/L (ref 135–145)

## 2018-09-21 LAB — CBC
HCT: 31.2 % — ABNORMAL LOW (ref 36.0–46.0)
Hemoglobin: 9.5 g/dL — ABNORMAL LOW (ref 12.0–15.0)
MCH: 27.5 pg (ref 26.0–34.0)
MCHC: 30.4 g/dL (ref 30.0–36.0)
MCV: 90.4 fL (ref 80.0–100.0)
Platelets: 472 10*3/uL — ABNORMAL HIGH (ref 150–400)
RBC: 3.45 MIL/uL — ABNORMAL LOW (ref 3.87–5.11)
RDW: 17 % — ABNORMAL HIGH (ref 11.5–15.5)
WBC: 21.8 10*3/uL — ABNORMAL HIGH (ref 4.0–10.5)
nRBC: 0.2 % (ref 0.0–0.2)

## 2018-09-21 LAB — PROCALCITONIN: Procalcitonin: <0.1 ng/mL

## 2018-09-21 MED ORDER — POTASSIUM CHLORIDE CRYS ER 20 MEQ PO TBCR
40.0000 meq | EXTENDED_RELEASE_TABLET | Freq: Once | ORAL | Status: AC
  Administered 2018-09-21: 40 meq via ORAL
  Filled 2018-09-21: qty 2

## 2018-09-21 NOTE — Consult Note (Signed)
Modified Barium Swallow Progress Note  Patient Details  Name: Grace Gilbert MRN: [id number] Date of Birth: 04/20/1932  Today's Date: 09/21/2018  Modified Barium Swallow completed.  Full report located under Chart Review in the Imaging Section.  Brief recommendations include the following:  Clinical Impression Pt seen in radiology for MBS to objectively assess oral and pharyngeal swallow function and safety.   ORAL: Pt has adequate dentition. Flash penetration was seen on thin liquids via cup and straw. Compensatory positions were not attempted, as they would likely be ineffective due to pt cognitive deficits. Oral prep and coordination were noted to be decreased, with extended oral transit and trace lingual residue. Pt benefited from cued dry swallow to clear oral residue. When given barium tablet, pt chewed it up and swallowed with boluses of puree.   PHARYNGEAL: Pt exhibits swallow reflex at the level of the vallecular sinus on thin liquid, puree and solid textures, and at the pyriform sinus on nectar thick liquid. Slight vallecular residue noted due to decreased tongue base retraction, which cleared effectively with dry swallow.  Given dis-coordinated oral prep and extended oral transit, pt is at increased aspiration risk with fatigue. Will downgrade diet to dys 2 for energy conservation. Thin liquids recommended, via cup or straw, crushed meds. Safe swallow precautions sent with transport to pt room. SLP will follow for diet tolerance and education.    Swallow Evaluation Recommendations  SLP Diet Recommendations: Dysphagia 2 (Fine chop) solids;Thin liquid   Liquid Administration via: Cup;Straw   Medication Administration: Crushed with puree   Supervision: Patient able to self feed;Staff to assist with self feeding;Full supervision/cueing for compensatory strategies   Compensations: Minimize environmental distractions;Slow rate;Small sips/bites;Multiple dry swallows after each  bite/sip   Postural Changes: Remain semi-upright after after feeds/meals (Comment);Seated upright at 90 degrees   Oral Care Recommendations: Oral care QID;Staff/trained caregiver to provide oral care  Celia B. Quentin [facility], [place] Speech Language Pathologist [phone]  Shonna Chock 09/21/2018,1:44 PM

## 2018-09-21 NOTE — Progress Notes (Signed)
PROGRESS NOTE    Grace Gilbert  ZRA:[id number] DOB: 03-Jul-1932 DOA: 09/17/2018 PCP: [name], MD     Brief Narrative:  [name] a 85 y.o.femalewith a known history of Alzheimer's dementia, hypertension, hyperlipidemia, GERD, diabetespresents to the emergency department for evaluation of fever, cough, shortness of breath. Patient was sent to the emergency department by her nursing home because she had developed symptoms of fever, shortness of breath and cough. Chest x-ray done today was read as pneumonia. Her white blood cell count was 24,000. Of note she was tested negative for COVID 2 days ago at her facility.  Patient did have a positive COVID contact, her nurse aide tested positive on 6/10.  Due to patient's high risk history, patient had a repeat COVID test which returned negative.  New events last 24 hours / Subjective: Patient remains confused, which is her baseline.  States that she is embarrassed (she had to be cleaned up by nursing this morning).   Assessment & Plan:   Active Problems:   HCAP (healthcare-associated pneumonia)  HCAP versus aspiration -Patient with high risk history of COVID due to close contact with nurse tech with tested positive at her nursing facility.  Patient tested negative once at nursing home and twice inpatient.  Checked other COVID testing such as sed rate, ferritin, LDH, procalcitonin, d-dimer.  Patient's high WBC goes against suspected COVID.  However her ferritin was elevated 1214, sed rate elevated 130, d-dimer elevated 7.65.  Discussed with infectious disease.  Due to patient's high risk history, will leave on droplet/contact precaution during her hospitalization -Continue Zosyn.  Vancomycin was discontinued as MRSA screen negative -Blood cultures negative to date -WBC trending down -MBS scheduled by SLP this afternoon  Acute hypoxemic respiratory failure -Continue nasal cannula O2 to maintain sat greater than 92%.     Hyponatremia -Resolved. Stop IVF   Hypokalemia -Replace, trend  Dementia -Continue memantine, Exelon  Type 2 diabetes -Sliding scale insulin   DVT prophylaxis: Lovenox Code Status: Full code Family Communication: None Disposition Plan: Pending improvement in her leukocytosis, oxygen saturation.  Suspect patient may return back to her skilled nursing facility on discharge.  MBS scheduled   Consultants:   ID over the phone  Procedures:   None   Antimicrobials:  Anti-infectives (From admission, onward)   Start     Dose/Rate Route Frequency Ordered Stop   09/19/18 0000  vancomycin (VANCOCIN) IVPB 750 mg/150 ml premix  Status:  Discontinued     750 mg 150 mL/hr over 60 Minutes Intravenous Every 24 hours 09/18/18 0009 09/19/18 0716   09/18/18 0600  piperacillin-tazobactam (ZOSYN) IVPB 3.375 g     3.375 g 12.5 mL/hr over 240 Minutes Intravenous Every 8 hours 09/18/18 0009     09/18/18 0000  piperacillin-tazobactam (ZOSYN) IVPB 3.375 g     3.375 g 100 mL/hr over 30 Minutes Intravenous  Once 09/17/18 2347 09/18/18 0114   09/18/18 0000  vancomycin (VANCOCIN) 1,250 mg in sodium chloride 0.9 % 250 mL IVPB     1,250 mg 166.7 mL/hr over 90 Minutes Intravenous  Once 09/17/18 2347 09/18/18 0259   09/17/18 1945  cefTRIAXone (ROCEPHIN) 2 g in sodium chloride 0.9 % 100 mL IVPB  Status:  Discontinued     2 g 200 mL/hr over 30 Minutes Intravenous Every 24 hours 09/17/18 1944 09/18/18 0008   09/17/18 1945  azithromycin (ZITHROMAX) 500 mg in sodium chloride 0.9 % 250 mL IVPB  Status:  Discontinued     500 mg  250 mL/hr over 60 Minutes Intravenous Every 24 hours 09/17/18 1944 09/18/18 0008       Objective: Vitals:   09/20/18 0543 09/20/18 1312 09/20/18 2154 09/21/18 0607  BP: (!) 139/56 (!) 141/64 (!) 147/60 (!) 157/75  Pulse: 69 63 75 82  Resp: 16 20 16 16   Temp: 98.2 F (36.8 C) 97.7 F (36.5 C) 98.3 F (36.8 C) 97.9 F (36.6 C)  TempSrc: Oral Oral Oral Oral  SpO2: 92% 94%  94%   Weight:      Height:        Intake/Output Summary (Last 24 hours) at 09/21/2018 1314 Last data filed at 09/21/2018 1039 Gross per 24 hour  Intake 2296.13 ml  Output 1800 ml  Net 496.13 ml   Filed Weights   09/18/18 0120  Weight: 60 kg    Examination: General exam: Appears calm and comfortable  Respiratory system: Clear to auscultation. Respiratory effort normal. Cardiovascular system: S1 & S2 heard, RRR. No JVD, murmurs, rubs, gallops or clicks. No pedal edema. Gastrointestinal system: Abdomen is nondistended, soft and nontender. No organomegaly or masses felt. Normal bowel sounds heard. Central nervous system: Alert  Extremities: Symmetric 5 x 5 power. Skin: No rashes, lesions or ulcers Psychiatry: Dementia   Data Reviewed: I have personally reviewed following labs and imaging studies  CBC: Recent Labs  Lab 09/17/18 1934 09/18/18 0120 09/19/18 0427 09/20/18 0338 09/21/18 0329  WBC 25.6* 23.6* 21.6* 24.6* 21.8*  NEUTROABS 21.1*  --  16.7*  --   --   HGB 11.0* 9.8* 9.1* 9.3* 9.5*  HCT 34.6* 30.4* 29.4* 30.1* 31.2*  MCV 89.2 89.9 90.5 90.1 90.4  PLT 466* 392 445* 464* 536*   Basic Metabolic Panel: Recent Labs  Lab 09/17/18 1934 09/18/18 0120 09/18/18 0500 09/19/18 0427 09/20/18 0338 09/21/18 0329  NA 134*  --  135 138 127* 137  K 3.4*  --  2.9* 3.6 2.9* 3.2*  CL 95*  --  99 105 92* 103  CO2 27  --  25 25 25 25   GLUCOSE 151*  --  150* 128* 135* 113*  BUN 9  --  7* 5* <5* <5*  CREATININE 0.46 0.51 0.49 0.46 0.45 0.41*  CALCIUM 8.7*  --  7.7* 7.5* 6.9* 7.5*  MG  --  1.9  --  2.1  --   --   PHOS  --  1.7*  --  1.5*  --   --    GFR: Estimated Creatinine Clearance: 43.2 mL/min (A) (by C-G formula based on SCr of 0.41 mg/dL (L)). Liver Function Tests: Recent Labs  Lab 09/17/18 1934 09/19/18 0427  AST 48*  --   ALT 29  --   ALKPHOS 168*  --   BILITOT 1.3*  --   PROT 8.0  --   ALBUMIN 2.7* 2.3*   No results for input(s): LIPASE, AMYLASE in the  last 168 hours. Recent Labs  Lab 09/18/18 1010  AMMONIA 24   Coagulation Profile: No results for input(s): INR, PROTIME in the last 168 hours. Cardiac Enzymes: No results for input(s): CKTOTAL, CKMB, CKMBINDEX, TROPONINI in the last 168 hours. BNP (last 3 results) No results for input(s): PROBNP in the last 8760 hours. HbA1C: No results for input(s): HGBA1C in the last 72 hours. CBG: Recent Labs  Lab 09/20/18 0730 09/20/18 1137 09/20/18 1619 09/20/18 2153 09/21/18 0804  GLUCAP 134* 118* 98 111* 102*   Lipid Profile: No results for input(s): CHOL, HDL, LDLCALC, TRIG, CHOLHDL, LDLDIRECT in  the last 72 hours. Thyroid Function Tests: No results for input(s): TSH, T4TOTAL, FREET4, T3FREE, THYROIDAB in the last 72 hours. Anemia Panel: Recent Labs    09/19/18 0813  FERRITIN 1,214*   Sepsis Labs: Recent Labs  Lab 09/17/18 1934 09/17/18 2134 09/19/18 0928 09/20/18 0338 09/21/18 0329  PROCALCITON  --   --  0.11 0.10 <0.10  LATICACIDVEN 2.1* 1.3  --   --   --     Recent Results (from the past 240 hour(s))  Blood Culture (routine x 2)     Status: None (Preliminary result)   Collection Time: 09/17/18  7:34 PM   Specimen: BLOOD LEFT WRIST  Result Value Ref Range Status   Specimen Description   Final    BLOOD LEFT WRIST Performed at [place] [address] [place], [place] [address]    Special Requests   Final    BOTTLES DRAWN AEROBIC AND ANAEROBIC Blood Culture results may not be optimal due to an inadequate volume of blood received in culture bottles Performed at [facility] [address] [place], [place] [address]    Culture   Final    NO GROWTH 4 DAYS Performed at [facility], [place] [address] [place], [place] [address]    Report Status PENDING  Incomplete  SARS Coronavirus 2 (CEPHEID- Performed in [place] hospital lab), Hosp Order     Status: None   Collection Time: 09/17/18  7:35 PM   Specimen: Nasopharyngeal Swab   Result Value Ref Range Status   SARS Coronavirus 2 NEGATIVE NEGATIVE Final    Comment: (NOTE) If result is NEGATIVE SARS-CoV-2 target nucleic acids are NOT DETECTED. The SARS-CoV-2 RNA is generally detectable in upper and lower  respiratory specimens during the acute phase of infection. The lowest  concentration of SARS-CoV-2 viral copies this assay can detect is 250  copies / mL. A negative result does not preclude SARS-CoV-2 infection  and should not be used as the sole basis for treatment or other  patient management decisions.  A negative result may occur with  improper specimen collection / handling, submission of specimen other  than nasopharyngeal swab, presence of viral mutation(s) within the  areas targeted by this assay, and inadequate number of viral copies  (<250 copies / mL). A negative result must be combined with clinical  observations, patient history, and epidemiological information. If result is POSITIVE SARS-CoV-2 target nucleic acids are DETECTED. The SARS-CoV-2 RNA is generally detectable in upper and lower  respiratory specimens dur ing the acute phase of infection.  Positive  results are indicative of active infection with SARS-CoV-2.  Clinical  correlation with patient history and other diagnostic information is  necessary to determine patient infection status.  Positive results do  not rule out bacterial infection or co-infection with other viruses. If result is PRESUMPTIVE POSTIVE SARS-CoV-2 nucleic acids MAY BE PRESENT.   A presumptive positive result was obtained on the submitted specimen  and confirmed on repeat testing.  While 2019 novel coronavirus  (SARS-CoV-2) nucleic acids may be present in the submitted sample  additional confirmatory testing may be necessary for epidemiological  and / or clinical management purposes  to differentiate between  SARS-CoV-2 and other Sarbecovirus currently known to infect humans.  If clinically indicated additional  testing with an alternate test  methodology [phone]) is advised. The SARS-CoV-2 RNA is generally  detectable in upper and lower respiratory sp ecimens during the acute  phase of infection. The  expected result is Negative. Fact Sheet for Patients:  StrictlyIdeas.no Fact Sheet for Healthcare Providers: BankingDealers.co.za This test is not yet approved or cleared by the [place] FDA and has been authorized for detection and/or diagnosis of SARS-CoV-2 by FDA under an Emergency Use Authorization (EUA).  This EUA will remain in effect (meaning this test can be used) for the duration of the COVID-19 declaration under Section 564(b)(1) of the Act, 21 U.S.C. section 360bbb-3(b)(1), unless the authorization is terminated or revoked sooner. Performed at [facility], [place] [address], [place], [place] [address]   Blood Culture (routine x 2)     Status: None (Preliminary result)   Collection Time: 09/17/18  7:39 PM   Specimen: BLOOD RIGHT FOREARM  Result Value Ref Range Status   Specimen Description   Final    BLOOD RIGHT FOREARM Performed at [facility], [place] [address], [place], [place] [address]    Special Requests   Final    Blood Culture results may not be optimal due to an inadequate volume of blood received in culture bottles BOTTLES DRAWN AEROBIC AND ANAEROBIC Performed at [facility], [place] [address], [place], [place] [address]    Culture   Final    NO GROWTH 4 DAYS Performed at [facility], [place] [address], [place], [place] [address]    Report Status PENDING  Incomplete  MRSA PCR Screening     Status: None   Collection Time: 09/18/18 10:45 AM   Specimen: Nasopharyngeal  Result Value Ref Range Status   MRSA by PCR NEGATIVE NEGATIVE Final    Comment:        The GeneXpert MRSA Assay (FDA approved for NASAL specimens only), is one component of a comprehensive MRSA colonization  surveillance program. It is not intended to diagnose MRSA infection nor to guide or monitor treatment for MRSA infections. Performed at [facility], [place] [address], [place], [place] [address]   SARS Coronavirus 2 (CEPHEID - Performed in [place] hospital lab), Hosp Order     Status: None   Collection Time: 09/18/18  1:01 PM   Specimen: Nasopharyngeal Swab  Result Value Ref Range Status   SARS Coronavirus 2 NEGATIVE NEGATIVE Final    Comment: (NOTE) If result is NEGATIVE SARS-CoV-2 target nucleic acids are NOT DETECTED. The SARS-CoV-2 RNA is generally detectable in upper and lower  respiratory specimens during the acute phase of infection. The lowest  concentration of SARS-CoV-2 viral copies this assay can detect is 250  copies / mL. A negative result does not preclude SARS-CoV-2 infection  and should not be used as the sole basis for treatment or other  patient management decisions.  A negative result may occur with  improper specimen collection / handling, submission of specimen other  than nasopharyngeal swab, presence of viral mutation(s) within the  areas targeted by this assay, and inadequate number of viral copies  (<250 copies / mL). A negative result must be combined with clinical  observations, patient history, and epidemiological information. If result is POSITIVE SARS-CoV-2 target nucleic acids are DETECTED. The SARS-CoV-2 RNA is generally detectable in upper and lower  respiratory specimens dur ing the acute phase of infection.  Positive  results are indicative of active infection with SARS-CoV-2.  Clinical  correlation with patient history and other diagnostic information is  necessary to determine patient infection status.  Positive results do  not rule out bacterial infection or co-infection with other viruses. If  result is PRESUMPTIVE POSTIVE SARS-CoV-2 nucleic acids MAY BE PRESENT.   A presumptive positive result was obtained on the  submitted specimen  and confirmed on repeat testing.  While 2019 novel coronavirus  (SARS-CoV-2) nucleic acids may be present in the submitted sample  additional confirmatory testing may be necessary for epidemiological  and / or clinical management purposes  to differentiate between  SARS-CoV-2 and other Sarbecovirus currently known to infect humans.  If clinically indicated additional testing with an alternate test  methodology [phone]) is advised. The SARS-CoV-2 RNA is generally  detectable in upper and lower respiratory sp ecimens during the acute  phase of infection. The expected result is Negative. Fact Sheet for Patients:  StrictlyIdeas.no Fact Sheet for Healthcare Providers: BankingDealers.co.za This test is not yet approved or cleared by the [place] FDA and has been authorized for detection and/or diagnosis of SARS-CoV-2 by FDA under an Emergency Use Authorization (EUA).  This EUA will remain in effect (meaning this test can be used) for the duration of the COVID-19 declaration under Section 564(b)(1) of the Act, 21 U.S.C. section 360bbb-3(b)(1), unless the authorization is terminated or revoked sooner. Performed at [facility], [place] [address] [place], [place] [address]   Urine culture     Status: None   Collection Time: 09/19/18  6:07 AM   Specimen: Urine, Clean Catch  Result Value Ref Range Status   Specimen Description   Final    URINE, CLEAN CATCH Performed at [facility], [place] [address] [place], [place] [address]    Special Requests   Final    NONE Performed at [facility], [place] [address], [place], [place] [address]    Culture   Final    NO GROWTH Performed at [facility], [place] [address], [place], [place] [address]    Report Status 09/20/2018 FINAL  Final       Radiology Studies: No results found.    Scheduled Meds: . aspirin EC   81 mg Oral Daily  . calcium-vitamin D  1 tablet Oral Q breakfast  . enoxaparin (LOVENOX) injection  40 mg Subcutaneous Q24H  . insulin aspart  0-5 Units Subcutaneous QHS  . insulin aspart  0-9 Units Subcutaneous TID WC  . loratadine  10 mg Oral Daily  . memantine  28 mg Oral Daily  . omega-3 acid ethyl esters  1 g Oral Daily  . rivastigmine  9.5 mg Transdermal Daily   Continuous Infusions: . piperacillin-tazobactam (ZOSYN)  IV 3.375 g (09/21/18 1131)     LOS: 4 days    Time spent: 25 minutes   Dessa Phi, DO [facility] [email or web address] 09/21/2018, 1:14 PM

## 2018-09-21 NOTE — Care Management Important Message (Signed)
Important Message  Patient Details IM Letter given to Velva Harman RN to present to the Patient Name: Grace Gilbert MRN: [id number] Date of Birth: 05/14/1932   Medicare Important Message Given:  Yes     Kerin Salen 09/21/2018, 12:18 PM

## 2018-09-22 LAB — CBC
HCT: 30 % — ABNORMAL LOW (ref 36.0–46.0)
Hemoglobin: 9.1 g/dL — ABNORMAL LOW (ref 12.0–15.0)
MCH: 27.3 pg (ref 26.0–34.0)
MCHC: 30.3 g/dL (ref 30.0–36.0)
MCV: 90.1 fL (ref 80.0–100.0)
Platelets: 468 10*3/uL — ABNORMAL HIGH (ref 150–400)
RBC: 3.33 MIL/uL — ABNORMAL LOW (ref 3.87–5.11)
RDW: 16.9 % — ABNORMAL HIGH (ref 11.5–15.5)
WBC: 20.8 10*3/uL — ABNORMAL HIGH (ref 4.0–10.5)
nRBC: 0.1 % (ref 0.0–0.2)

## 2018-09-22 LAB — GLUCOSE, CAPILLARY
Glucose-Capillary: 95 mg/dL (ref 70–99)
Glucose-Capillary: 107 mg/dL — ABNORMAL HIGH (ref 70–99)
Glucose-Capillary: 109 mg/dL — ABNORMAL HIGH (ref 70–99)
Glucose-Capillary: 112 mg/dL — ABNORMAL HIGH (ref 70–99)

## 2018-09-22 LAB — CULTURE, BLOOD (ROUTINE X 2)
Culture: NO GROWTH
Culture: NO GROWTH

## 2018-09-22 LAB — BASIC METABOLIC PANEL
Anion gap: 8 (ref 5–15)
BUN: <5 mg/dL — ABNORMAL LOW (ref 8–23)
CO2: 24 mmol/L (ref 22–32)
Calcium: 7.8 mg/dL — ABNORMAL LOW (ref 8.9–10.3)
Chloride: 104 mmol/L (ref 98–111)
Creatinine, Ser: 0.41 mg/dL — ABNORMAL LOW (ref 0.44–1.00)
GFR calc Af Amer: >60 mL/min (ref >60)
GFR calc non Af Amer: >60 mL/min (ref >60)
Glucose, Bld: 104 mg/dL — ABNORMAL HIGH (ref 70–99)
Potassium: 3.8 mmol/L (ref 3.5–5.1)
Sodium: 136 mmol/L (ref 135–145)

## 2018-09-22 NOTE — TOC Initial Note (Addendum)
Transition of Care [facility]) - Initial/Assessment Note    Patient Details  Name: Grace Gilbert MRN: [id number] Date of Birth: [birth year]  [facility] CM/SW Contact:    Servando Snare, LCSW Phone Number: 09/22/2018, 11:02 AM  Clinical Narrative:     [facility]. If patient does not need therapy at dc she will return to [place]. If patient needs SNF for rehab she will need [facility] and pre auth from [facility]. Patient will need negative Covid test within 48 hours of dc.               Expected Discharge Plan: Skilled Nursing Facility Barriers to Discharge: Continued Medical Work up   Patient Goals and CMS Choice   CMS Medicare.gov Compare Post Acute Care list provided to:: (Patient is from SNF.)    Expected Discharge Plan and Services Expected Discharge Plan: [place] In-house Referral: NA Discharge Planning Services: NA Post Acute Care Choice: [place] Living arrangements for the past 2 months: [place] Expected Discharge Date: (unknown)                                    Prior Living Arrangements/Services Living arrangements for the past 2 months: [place] Lives with:: Facility Resident Patient language and need for interpreter reviewed:: Yes Do you feel safe going back to the place where you live?: (Patient only oriented to self.)      Need for Family Participation in Patient Care: Yes (Comment) Care giver support system in place?: Yes (comment)   Criminal Activity/Legal Involvement Pertinent to Current Situation/Hospitalization: No - Comment as needed  Activities of Daily Living Home Assistive Devices/Equipment: Eyeglasses ADL Screening (condition at time of admission) Patient's cognitive ability adequate to safely complete daily activities?: No Is the patient deaf or have difficulty hearing?: No Does the patient have difficulty seeing, even when  wearing glasses/contacts?: No Does the patient have difficulty concentrating, remembering, or making decisions?: Yes Patient able to express need for assistance with ADLs?: Yes Does the patient have difficulty dressing or bathing?: Yes Independently performs ADLs?: No Communication: Independent Dressing (OT): Needs assistance Does the patient have difficulty walking or climbing stairs?: Yes Weakness of Legs: Both Weakness of Arms/Hands: None  Permission Sought/Granted Permission sought to share information with : Family Supports Permission granted to share information with : No(Oriented to self only)  Share Information with NAME: Benjamine Mola  Permission granted to share info w AGENCY[facility]  Permission granted to share info w Relationship: Gaurdian     Emotional Assessment Appearance:: Appears stated age Attitude/Demeanor/Rapport: Unable to Assess Affect (typically observed): Unable to Assess Orientation: : Oriented to Self Alcohol / Substance Use: Not Applicable Psych Involvement: No (comment)  Admission diagnosis:  PNA Patient Active Problem List   Diagnosis Date Noted  . HCAP (healthcare-associated pneumonia) 09/17/2018  . Exposure to Covid-19 Virus 09/17/2018  . Symptoms of urinary tract infection 09/14/2018  . Allergic rhinitis 06/02/2017  . History of TIA (transient ischemic attack) 03/05/2017  . History of rotator cuff tear 03/05/2017  . Age-related osteoporosis without current pathological fracture 12/09/2016  . Prediabetes 11/20/2016  . Hypertension   . Alzheimer's dementia ([place])   . HLD (hyperlipidemia)   . Esophageal stricture    PCP:  [name], MD Pharmacy:   [place]  [place] [address] Phone: [phone] Fax: [phone]     Social Determinants of Health (SDOH) Interventions    Readmission Risk Interventions Readmission Risk Prevention Plan  09/22/2018  Home Care Screening Complete  Some recent data might be hidden

## 2018-09-22 NOTE — Progress Notes (Signed)
Physical Therapy Treatment Patient Details Name: Grace Gilbert MRN: [id number] DOB: 05-05-1932 Today's Date: 09/22/2018    History of Present Illness 85 y.o. female with a known history of Alzheimer's dementia, hypertension, hyperlipidemia, GERD, diabetes presents to the emergency department for evaluation of fever, cough, shortness of breath.  Patient was sent to the emergency department by her nursing home because she had developed symptoms of fever, shortness of breath and cough.  Chest x-ray showed pneumonia.    PT Comments    Pt friendly and willing to participate.  Pt able to recall name and DOB, unaware of where she is or reasoning.  Pt with min A bed mobility and transfer training, did require frequent verbal and tactile cueing to complete STS safely following cueing for hand placement.  Pt able to ambulate short durartion (22ft) from bed to chair.  Short shuffled gait patterns noted.  Upon standing infront of chair, pt increased confusion on next task.  Verbal and tactile cueing for safe mechanics to sit in chair.  EOS pt left reclined in chair with call bell within reach and chair alarm set.  RN aware of status.  Pt was limited by fatigue following transfer to chair, no reports of pain through session.      Follow Up Recommendations  SNF;Supervision for mobility/OOB     Equipment Recommendations  None recommended by PT    Recommendations for Other Services       Precautions / Restrictions Precautions Precautions: Fall Restrictions Weight Bearing Restrictions: No    Mobility  Bed Mobility Overal bed mobility: Needs Assistance Bed Mobility: Supine to Sit     Supine to sit: Min assist     General bed mobility comments: Cueing for hand placement to assist with supine to sit  Transfers Overall transfer level: Needs assistance Equipment used: Rolling walker (2 wheeled)   Sit to Stand: Min assist         General transfer comment: Min A to raise, mod A with  verbal/tactile cueing to step infront of chair, increased confusion on what to do next, required cueing for mechanics to descend to chair safely  Ambulation/Gait Ambulation/Gait assistance: Min assist Gait Distance (Feet): 4 Feet Assistive device: Rolling walker (2 wheeled) Gait Pattern/deviations: Trunk flexed;Shuffle;Decreased stride length Gait velocity: slow   General Gait Details: slow labored movements   Stairs             Wheelchair Mobility    Modified Rankin (Stroke Patients Only)       Balance                                            Cognition Arousal/Alertness: Awake/alert Behavior During Therapy: WFL for tasks assessed/performed Overall Cognitive Status: No family/caregiver present to determine baseline cognitive functioning                                 General Comments: oriented to self only, able to verbalize name and correct DOB; unaware of location, cannot recall if she was able to walk PTA      Exercises      General Comments        Pertinent Vitals/Pain Pain Assessment: No/denies pain    Home Living  Prior Function            PT Goals (current goals can now be found in the care plan section)      Frequency    Min 2X/week      PT Plan      Co-evaluation              AM-PAC PT "6 Clicks" Mobility   Outcome Measure  Help needed turning from your back to your side while in a flat bed without using bedrails?: A Lot Help needed moving from lying on your back to sitting on the side of a flat bed without using bedrails?: A Lot Help needed moving to and from a bed to a chair (including a wheelchair)?: A Lot Help needed standing up from a chair using your arms (e.g., wheelchair or bedside chair)?: A Lot Help needed to walk in hospital room?: A Lot Help needed climbing 3-5 steps with a railing? : Total 6 Click Score: 11    End of Session Equipment Utilized  During Treatment: Gait belt Activity Tolerance: Patient tolerated treatment well;No increased pain Patient left: in chair;with call bell/phone within reach;with chair alarm set(RN aware of status) Nurse Communication: Mobility status PT Visit Diagnosis: Muscle weakness (generalized) (M62.81);Difficulty in walking, not elsewhere classified (R26.2)     Time: 1517-6160 PT Time Calculation (min) (ACUTE ONLY): 15 min  Charges:  $Therapeutic Activity: 8-22 mins                     Aldona Lento, PTA  Aldona Lento 09/22/2018, 3:03 PM

## 2018-09-22 NOTE — Progress Notes (Signed)
PROGRESS NOTE    Grace Gilbert  ZWC:[id number] DOB: October 20, 1932 DOA: 09/17/2018 PCP: [name], MD     Brief Narrative:  Grace Gilbert a 85 y.o.femalewith a known history of Alzheimer's dementia, hypertension, hyperlipidemia, GERD, diabetespresents to the emergency department for evaluation of fever, cough, shortness of breath. Patient was sent to the emergency department by her nursing home because she had developed symptoms of fever, shortness of breath and cough. Chest x-ray done today was read as pneumonia. Her white blood cell count was 24,000. Of note she was tested negative for COVID 2 days ago at her facility.  Patient did have a positive COVID contact, her nurse aide tested positive on 6/10.  Due to patient's high risk history, patient had a repeat COVID test which returned negative.  New events last 24 hours / Subjective: No new issues overnight. Oxygen weaned to room air.   Assessment & Plan:   Active Problems:   HCAP (healthcare-associated pneumonia)  HCAP versus aspiration -Patient with high risk history of COVID due to close contact with nurse tech with tested positive at her nursing facility.  Patient tested negative once at nursing home and twice inpatient.  Checked other COVID testing such as sed rate, ferritin, LDH, procalcitonin, d-dimer.  Patient's high WBC goes against suspected COVID.  However her ferritin was elevated 1214, sed rate elevated 130, d-dimer elevated 7.65.  Discussed with infectious disease.  Due to patient's high risk history, will leave on droplet/contact precaution during her hospitalization -Continue Zosyn.  Vancomycin was discontinued as MRSA screen negative -Blood cultures negative to date -WBC trending down 20.8  -SLP eval, on dysphagia 2 diet   Acute hypoxemic respiratory failure -Now on room air   Hyponatremia -Resolved. Stop IVF   Dementia -Continue memantine, Exelon  Type 2 diabetes -Sliding scale insulin   DVT  prophylaxis: Lovenox Code Status: Full code Family Communication: Spoke with legal guardian/cousin over the phone with update  Disposition Plan: Pending improvement in her leukocytosis, return back to SNF    Consultants:   ID over the phone  Procedures:   None   Antimicrobials:  Anti-infectives (From admission, onward)   Start     Dose/Rate Route Frequency Ordered Stop   09/19/18 0000  vancomycin (VANCOCIN) IVPB 750 mg/150 ml premix  Status:  Discontinued     750 mg 150 mL/hr over 60 Minutes Intravenous Every 24 hours 09/18/18 0009 09/19/18 0716   09/18/18 0600  piperacillin-tazobactam (ZOSYN) IVPB 3.375 g     3.375 g 12.5 mL/hr over 240 Minutes Intravenous Every 8 hours 09/18/18 0009     09/18/18 0000  piperacillin-tazobactam (ZOSYN) IVPB 3.375 g     3.375 g 100 mL/hr over 30 Minutes Intravenous  Once 09/17/18 2347 09/18/18 0114   09/18/18 0000  vancomycin (VANCOCIN) 1,250 mg in sodium chloride 0.9 % 250 mL IVPB     1,250 mg 166.7 mL/hr over 90 Minutes Intravenous  Once 09/17/18 2347 09/18/18 0259   09/17/18 1945  cefTRIAXone (ROCEPHIN) 2 g in sodium chloride 0.9 % 100 mL IVPB  Status:  Discontinued     2 g 200 mL/hr over 30 Minutes Intravenous Every 24 hours 09/17/18 1944 09/18/18 0008   09/17/18 1945  azithromycin (ZITHROMAX) 500 mg in sodium chloride 0.9 % 250 mL IVPB  Status:  Discontinued     500 mg 250 mL/hr over 60 Minutes Intravenous Every 24 hours 09/17/18 1944 09/18/18 0008       Objective: Vitals:   09/21/18 0607 09/21/18  1408 09/21/18 2141 09/22/18 0642  BP: (!) 157/75 (!) 148/76 (!) 160/67 (!) 152/54  Pulse: 82 77 84 72  Resp: 16 18 18 18   Temp: 97.9 F (36.6 C) 98 F (36.7 C) 98.3 F (36.8 C) 98.1 F (36.7 C)  TempSrc: Oral Oral Oral Oral  SpO2:  99% 92% 91%  Weight:      Height:        Intake/Output Summary (Last 24 hours) at 09/22/2018 0951 Last data filed at 09/22/2018 0900 Gross per 24 hour  Intake 1189.32 ml  Output 2250 ml  Net -1060.68  ml   Filed Weights   09/18/18 0120  Weight: 60 kg    Examination: General exam: Appears calm and comfortable  Respiratory system: Clear to auscultation. Respiratory effort normal. On room air  Cardiovascular system: S1 & S2 heard, RRR. No JVD, murmurs, rubs, gallops or clicks. No pedal edema. Gastrointestinal system: Abdomen is nondistended, soft and nontender. No organomegaly or masses felt. Normal bowel sounds heard. Central nervous system: Alert. Extremities: Symmetric Skin: No rashes, lesions or ulcers Psychiatry: Dementia    Data Reviewed: I have personally reviewed following labs and imaging studies  CBC: Recent Labs  Lab 09/17/18 1934 09/18/18 0120 09/19/18 0427 09/20/18 0338 09/21/18 0329 09/22/18 0509  WBC 25.6* 23.6* 21.6* 24.6* 21.8* 20.8*  NEUTROABS 21.1*  --  16.7*  --   --   --   HGB 11.0* 9.8* 9.1* 9.3* 9.5* 9.1*  HCT 34.6* 30.4* 29.4* 30.1* 31.2* 30.0*  MCV 89.2 89.9 90.5 90.1 90.4 90.1  PLT 466* 392 445* 464* 472* 619*   Basic Metabolic Panel: Recent Labs  Lab 09/18/18 0120 09/18/18 0500 09/19/18 0427 09/20/18 0338 09/21/18 0329 09/22/18 0509  NA  --  135 138 127* 137 136  K  --  2.9* 3.6 2.9* 3.2* 3.8  CL  --  99 105 92* 103 104  CO2  --  25 25 25 25 24   GLUCOSE  --  150* 128* 135* 113* 104*  BUN  --  7* 5* <5* <5* <5*  CREATININE 0.51 0.49 0.46 0.45 0.41* 0.41*  CALCIUM  --  7.7* 7.5* 6.9* 7.5* 7.8*  MG 1.9  --  2.1  --   --   --   PHOS 1.7*  --  1.5*  --   --   --    GFR: Estimated Creatinine Clearance: 43.2 mL/min (A) (by C-G formula based on SCr of 0.41 mg/dL (L)). Liver Function Tests: Recent Labs  Lab 09/17/18 1934 09/19/18 0427  AST 48*  --   ALT 29  --   ALKPHOS 168*  --   BILITOT 1.3*  --   PROT 8.0  --   ALBUMIN 2.7* 2.3*   No results for input(s): LIPASE, AMYLASE in the last 168 hours. Recent Labs  Lab 09/18/18 1010  AMMONIA 24   Coagulation Profile: No results for input(s): INR, PROTIME in the last 168 hours.  Cardiac Enzymes: No results for input(s): CKTOTAL, CKMB, CKMBINDEX, TROPONINI in the last 168 hours. BNP (last 3 results) No results for input(s): PROBNP in the last 8760 hours. HbA1C: No results for input(s): HGBA1C in the last 72 hours. CBG: Recent Labs  Lab 09/21/18 0804 09/21/18 1150 09/21/18 1624 09/21/18 2138 09/22/18 0755  GLUCAP 102* 121* 115* 105* 95   Lipid Profile: No results for input(s): CHOL, HDL, LDLCALC, TRIG, CHOLHDL, LDLDIRECT in the last 72 hours. Thyroid Function Tests: No results for input(s): TSH, T4TOTAL, FREET4, T3FREE, THYROIDAB  in the last 72 hours. Anemia Panel: No results for input(s): VITAMINB12, FOLATE, FERRITIN, TIBC, IRON, RETICCTPCT in the last 72 hours. Sepsis Labs: Recent Labs  Lab 09/17/18 1934 09/17/18 2134 09/19/18 0928 09/20/18 0338 09/21/18 0329  PROCALCITON  --   --  0.11 0.10 <0.10  LATICACIDVEN 2.1* 1.3  --   --   --     Recent Results (from the past 240 hour(s))  Blood Culture (routine x 2)     Status: None   Collection Time: 09/17/18  7:34 PM   Specimen: BLOOD LEFT WRIST  Result Value Ref Range Status   Specimen Description   Final    BLOOD LEFT WRIST Performed at [facility] [address] [place], [place] [address]    Special Requests   Final    BOTTLES DRAWN AEROBIC AND ANAEROBIC Blood Culture results may not be optimal due to an inadequate volume of blood received in culture bottles Performed at [facility] [address], [place], [place] [address]    Culture   Final    NO GROWTH 5 DAYS Performed at [facility], [place] [address], [place], [place] [address]    Report Status 09/22/2018 FINAL  Final  SARS Coronavirus 2 (CEPHEID- Performed in [facility] hospital lab), Hosp Order     Status: None   Collection Time: 09/17/18  7:35 PM   Specimen: Nasopharyngeal Swab  Result Value Ref Range Status   SARS Coronavirus 2 NEGATIVE NEGATIVE Final    Comment: (NOTE) If result is NEGATIVE  SARS-CoV-2 target nucleic acids are NOT DETECTED. The SARS-CoV-2 RNA is generally detectable in upper and lower  respiratory specimens during the acute phase of infection. The lowest  concentration of SARS-CoV-2 viral copies this assay can detect is 250  copies / mL. A negative result does not preclude SARS-CoV-2 infection  and should not be used as the sole basis for treatment or other  patient management decisions.  A negative result may occur with  improper specimen collection / handling, submission of specimen other  than nasopharyngeal swab, presence of viral mutation(s) within the  areas targeted by this assay, and inadequate number of viral copies  (<250 copies / mL). A negative result must be combined with clinical  observations, patient history, and epidemiological information. If result is POSITIVE SARS-CoV-2 target nucleic acids are DETECTED. The SARS-CoV-2 RNA is generally detectable in upper and lower  respiratory specimens dur ing the acute phase of infection.  Positive  results are indicative of active infection with SARS-CoV-2.  Clinical  correlation with patient history and other diagnostic information is  necessary to determine patient infection status.  Positive results do  not rule out bacterial infection or co-infection with other viruses. If result is PRESUMPTIVE POSTIVE SARS-CoV-2 nucleic acids MAY BE PRESENT.   A presumptive positive result was obtained on the submitted specimen  and confirmed on repeat testing.  While 2019 novel coronavirus  (SARS-CoV-2) nucleic acids may be present in the submitted sample  additional confirmatory testing may be necessary for epidemiological  and / or clinical management purposes  to differentiate between  SARS-CoV-2 and other Sarbecovirus currently known to infect humans.  If clinically indicated additional testing with an alternate test  methodology [phone]) is advised. The SARS-CoV-2 RNA is generally  detectable in upper  and lower respiratory sp ecimens during the acute  phase of infection. The expected result is Negative. Fact Sheet for Patients:  StrictlyIdeas.no Fact Sheet for Healthcare  Providers: BankingDealers.co.za This test is not yet approved or cleared by the [place] and has been authorized for detection and/or diagnosis of SARS-CoV-2 by FDA under an Emergency Use Authorization (EUA).  This EUA will remain in effect (meaning this test can be used) for the duration of the COVID-19 declaration under Section 564(b)(1) of the Act, 21 U.S.C. section 360bbb-3(b)(1), unless the authorization is terminated or revoked sooner. Performed at [facility], [place] [address], [place], [place] [address]   Blood Culture (routine x 2)     Status: None   Collection Time: 09/17/18  7:39 PM   Specimen: BLOOD RIGHT FOREARM  Result Value Ref Range Status   Specimen Description   Final    BLOOD RIGHT FOREARM Performed at [facility], [place] [address] [place], [place] [address]    Special Requests   Final    Blood Culture results may not be optimal due to an inadequate volume of blood received in culture bottles BOTTLES DRAWN AEROBIC AND ANAEROBIC Performed at [facility], [place] [address] [place], [place] [address]    Culture   Final    NO GROWTH 5 DAYS Performed at [facility], [place] [address] [place], [place] [address]    Report Status 09/22/2018 FINAL  Final  MRSA PCR Screening     Status: None   Collection Time: 09/18/18 10:45 AM   Specimen: Nasopharyngeal  Result Value Ref Range Status   MRSA by PCR NEGATIVE NEGATIVE Final    Comment:        The GeneXpert MRSA Assay (FDA approved for NASAL specimens only), is one component of a comprehensive MRSA colonization surveillance program. It is not intended to diagnose MRSA infection nor to guide or monitor treatment for MRSA infections. Performed  at [facility], [place] [address], [place], [place] [address]   SARS Coronavirus 2 (CEPHEID - Performed in [facility] hospital lab), Hosp Order     Status: None   Collection Time: 09/18/18  1:01 PM   Specimen: Nasopharyngeal Swab  Result Value Ref Range Status   SARS Coronavirus 2 NEGATIVE NEGATIVE Final    Comment: (NOTE) If result is NEGATIVE SARS-CoV-2 target nucleic acids are NOT DETECTED. The SARS-CoV-2 RNA is generally detectable in upper and lower  respiratory specimens during the acute phase of infection. The lowest  concentration of SARS-CoV-2 viral copies this assay can detect is 250  copies / mL. A negative result does not preclude SARS-CoV-2 infection  and should not be used as the sole basis for treatment or other  patient management decisions.  A negative result may occur with  improper specimen collection / handling, submission of specimen other  than nasopharyngeal swab, presence of viral mutation(s) within the  areas targeted by this assay, and inadequate number of viral copies  (<250 copies / mL). A negative result must be combined with clinical  observations, patient history, and epidemiological information. If result is POSITIVE SARS-CoV-2 target nucleic acids are DETECTED. The SARS-CoV-2 RNA is generally detectable in upper and lower  respiratory specimens dur ing the acute phase of infection.  Positive  results are indicative of active infection with SARS-CoV-2.  Clinical  correlation with patient history and other diagnostic information is  necessary to determine patient infection status.  Positive results do  not rule out bacterial infection or co-infection with other viruses. If result is PRESUMPTIVE POSTIVE SARS-CoV-2 nucleic acids MAY BE PRESENT.   A presumptive positive  result was obtained on the submitted specimen  and confirmed on repeat testing.  While 2019 novel coronavirus  (SARS-CoV-2) nucleic acids may be present in the submitted  sample  additional confirmatory testing may be necessary for epidemiological  and / or clinical management purposes  to differentiate between  SARS-CoV-2 and other Sarbecovirus currently known to infect humans.  If clinically indicated additional testing with an alternate test  methodology [phone]) is advised. The SARS-CoV-2 RNA is generally  detectable in upper and lower respiratory sp ecimens during the acute  phase of infection. The expected result is Negative. Fact Sheet for Patients:  StrictlyIdeas.no Fact Sheet for Healthcare Providers: BankingDealers.co.za This test is not yet approved or cleared by the [place] FDA and has been authorized for detection and/or diagnosis of SARS-CoV-2 by FDA under an Emergency Use Authorization (EUA).  This EUA will remain in effect (meaning this test can be used) for the duration of the COVID-19 declaration under Section 564(b)(1) of the Act, 21 U.S.C. section 360bbb-3(b)(1), unless the authorization is terminated or revoked sooner. Performed at [facility], [place] [address], [place], [place] [address]   Urine culture     Status: None   Collection Time: 09/19/18  6:07 AM   Specimen: Urine, Clean Catch  Result Value Ref Range Status   Specimen Description   Final    URINE, CLEAN CATCH Performed at [facility], [place] [address], [place], [place] [address]    Special Requests   Final    NONE Performed at [facility], [place] [address], [place], [place] [address]    Culture   Final    NO GROWTH Performed at [facility], [place] [address] [place], [place] [address]    Report Status 09/20/2018 FINAL  Final       Radiology Studies: Dg Swallowing Func-speech Pathology  Result Date: 09/21/2018 Objective Swallowing Evaluation: Type of Study: MBS-Modified Barium Swallow Study  Patient Details Name: TERESE HEIER MRN: [id number]  Date of Birth: Mar 18, 1933 Today's Date: 09/21/2018 Time: SLP Start Time (ACUTE ONLY): 68 -SLP Stop Time (ACUTE ONLY): 1300 SLP Time Calculation (min) (ACUTE ONLY): 30 min Past Medical History: Past Medical History: Diagnosis Date . Abnormal CT scan, chest September 2011  Scar tissue . Allergic rhinitis  . Alzheimer's dementia ([place])  . Asthma  . Diabetes mellitus   borderline and under control-diet ,exercise . Esophageal stricture  . Family history of colon cancer   Mother . GERD (gastroesophageal reflux disease)  . Hiatal hernia  . HLD (hyperlipidemia)  . Hypertension  . Osteopenia  . Pneumonia 12-15 yrs.ago  yrs. ago . TIA (transient ischemic attack)  . Vitamin D deficiency  Past Surgical History: Past Surgical History: Procedure Laterality Date . ABDOMINAL HYSTERECTOMY  1980  . CATARACT EXTRACTION Left 2014 . SHOULDER OPEN ROTATOR CUFF REPAIR  05/22/2011  Procedure: ROTATOR CUFF REPAIR SHOULDER OPEN;  Surgeon: Tobi Bastos, MD;  Location: [facility];  Service: Orthopedics;  Laterality: Left; . THORACOTOMY / DECORTICATION PARIETAL PLEURA Right 204  empyema . TONSILLECTOMY   HPI: [name] is an 85 y.o. female with a known history of Alzheimer's dementia, hypertension, hyperlipidemia, GERD, diabetes presents to the emergency department for evaluation of fever, cough, shortness of breath.  Patient was sent to the emergency department by her nursing home because she had developed symptoms of fever, shortness of breath and cough.  Chest x-ray showed pneumonia.  Subjective: Pt seen in radiology for MBS. Pt alert  but confused (unaware of how she takes her medications, as her parents handle that for her) Assessment / Plan / Recommendation CHL IP CLINICAL IMPRESSIONS 09/21/2018 Clinical Impression Pt seen in radiology for MBS to objectively assess oral and pharyngeal swallow function and safety. ORAL: Pt exhibits adequate dentition. Flash penetration was seen on thin liquids via cup and straw. Compensatory positions were not  attempted, as they would likely be ineffective due to pt cognitive deficits. Oral prep and coordination were noted to be decreased, with extended oral transit and trace lingual residue. Pt benefited from cued dry swallow to clear oral residue. When given barium tablet, pt chewed it up and swallowed with boluses of puree. PHARYNGEAL: Pt exhibits swallow reflex at the level of the vallecular sinus on thin liquid, puree and solid textures, and at the pyriform sinus on nectar thick liquid. Slight vallecular residue noted due to decreased tongue base retraction, which cleared effectively with dry swallow. Given dis-coordinated oral prep and extended oral transit, pt is at increased aspiration risk with fatigue. Will downgrade diet to dys 2 for energy conservation. Thin liquids recommended, via cup or straw, crushed meds. Safe swallow precautions sent with transport to pt room. SLP will follow for diet tolerance and education.  SLP Visit Diagnosis Dysphagia, oropharyngeal phase (R13.12)     Impact on safety and function Mild aspiration risk   CHL IP TREATMENT RECOMMENDATION 09/21/2018 Treatment Recommendations Therapy as outlined in treatment plan below   Prognosis 09/21/2018 Prognosis for Safe Diet Advancement Good Barriers to Reach Goals Cognitive deficits Barriers/Prognosis Comment -- CHL IP DIET RECOMMENDATION 09/21/2018 SLP Diet Recommendations Dysphagia 2 (Fine chop) solids;Thin liquid Liquid Administration via Cup;Straw Medication Administration Crushed with puree Compensations Minimize environmental distractions;Slow rate;Small sips/bites;Multiple dry swallows after each bite/sip Postural Changes Remain semi-upright after after feeds/meals (Comment);Seated upright at 90 degrees   CHL IP OTHER RECOMMENDATIONS 09/21/2018 Recommended Consults -- Oral Care Recommendations Oral care QID;Staff/trained caregiver to provide oral care Other Recommendations --   CHL IP FOLLOW UP RECOMMENDATIONS 09/21/2018 Follow up  Recommendations 24 hour supervision/assistance   CHL IP FREQUENCY AND DURATION 09/21/2018 Speech Therapy Frequency (ACUTE ONLY) min 1 x/week Treatment Duration 1 week;2 weeks      CHL IP ORAL PHASE 09/21/2018 Oral Phase Impaired Oral - Pudding Teaspoon -- Oral - Pudding Cup -- Oral - Honey Teaspoon -- Oral - Honey Cup -- Oral - Nectar Teaspoon Decreased bolus cohesion Oral - Nectar Cup -- Oral - Nectar Straw -- Oral - Thin Teaspoon -- Oral - Thin Cup Decreased bolus cohesion Oral - Thin Straw -- Oral - Puree Decreased bolus cohesion Oral - Mech Soft -- Oral - Regular Delayed oral transit;Decreased bolus cohesion Oral - Multi-Consistency -- Oral - Pill Decreased bolus cohesion;Delayed oral transit Oral Phase - Comment --  CHL IP PHARYNGEAL PHASE 09/21/2018 Pharyngeal Phase Impaired Pharyngeal- Pudding Teaspoon -- Pharyngeal -- Pharyngeal- Pudding Cup -- Pharyngeal -- Pharyngeal- Honey Teaspoon -- Pharyngeal -- Pharyngeal- Honey Cup -- Pharyngeal -- Pharyngeal- Nectar Teaspoon Delayed swallow initiation-vallecula;Pharyngeal residue - valleculae;Reduced tongue base retraction Pharyngeal -- Pharyngeal- Nectar Cup -- Pharyngeal -- Pharyngeal- Nectar Straw -- Pharyngeal -- Pharyngeal- Thin Teaspoon -- Pharyngeal -- Pharyngeal- Thin Cup Penetration/Aspiration during swallow;Reduced tongue base retraction;Pharyngeal residue - valleculae Pharyngeal Material enters airway, remains ABOVE vocal cords then ejected out Pharyngeal- Thin Straw -- Pharyngeal -- Pharyngeal- Puree Pharyngeal residue - valleculae;Reduced tongue base retraction Pharyngeal -- Pharyngeal- Mechanical Soft -- Pharyngeal -- Pharyngeal- Regular Reduced tongue base retraction;Pharyngeal residue - valleculae Pharyngeal -- Pharyngeal-  Multi-consistency -- Pharyngeal -- Pharyngeal- Pill Other (Comment) Pharyngeal -- Pharyngeal Comment --  CHL IP CERVICAL ESOPHAGEAL PHASE 09/21/2018 Cervical Esophageal Phase WFL Pudding Teaspoon -- Pudding Cup -- Honey Teaspoon --  Honey Cup -- Nectar Teaspoon -- Nectar Cup -- Nectar Straw -- Thin Teaspoon -- Thin Cup -- Thin Straw -- Puree -- Mechanical Soft -- Regular -- Multi-consistency -- Pill -- Cervical Esophageal Comment -- Celia B. Quentin Ore [facility], CCC-SLP Speech Language Pathologist [phone] Shonna Chock 09/21/2018, 1:23 PM                 Scheduled Meds: . aspirin EC  81 mg Oral Daily  . calcium-vitamin D  1 tablet Oral Q breakfast  . enoxaparin (LOVENOX) injection  40 mg Subcutaneous Q24H  . insulin aspart  0-5 Units Subcutaneous QHS  . insulin aspart  0-9 Units Subcutaneous TID WC  . loratadine  10 mg Oral Daily  . memantine  28 mg Oral Daily  . omega-3 acid ethyl esters  1 g Oral Daily  . rivastigmine  9.5 mg Transdermal Daily   Continuous Infusions: . piperacillin-tazobactam (ZOSYN)  IV 3.375 g (09/22/18 0315)     LOS: 5 days    Time spent: 20 minutes   Dessa Phi, DO [facility] [email or web address] 09/22/2018, 9:51 AM

## 2018-09-22 NOTE — TOC Progression Note (Signed)
Transition of Care [facility]) - Progression Note    Patient Details  Name: Grace Gilbert MRN: [id number] Date of Birth: [birth year]  [facility] CM/SW Contact  Servando Snare, [place] Phone Number: 09/22/2018, 10:16 AM  Clinical Narrative:   LCSW consulted for SNF placment. [facility]. LCSW attempting to confirm patient level of care at facility. Last note from 06/17/2018 states patient was in independent Living and would work with facility to transition to higher level of care. LCSW left messages for facility staff and patient contacts.          Expected Discharge Plan and Services           Expected Discharge Date: (unknown)                                     Social Determinants of Health (SDOH) Interventions    Readmission Risk Interventions No flowsheet data found.

## 2018-09-23 DIAGNOSIS — D75839 Thrombocytosis, unspecified: Secondary | ICD-10-CM

## 2018-09-23 DIAGNOSIS — J181 Lobar pneumonia, unspecified organism: Secondary | ICD-10-CM

## 2018-09-23 DIAGNOSIS — D649 Anemia, unspecified: Secondary | ICD-10-CM

## 2018-09-23 DIAGNOSIS — F0150 Vascular dementia without behavioral disturbance: Secondary | ICD-10-CM

## 2018-09-23 DIAGNOSIS — D72829 Elevated white blood cell count, unspecified: Secondary | ICD-10-CM

## 2018-09-23 DIAGNOSIS — R627 Adult failure to thrive: Secondary | ICD-10-CM

## 2018-09-23 DIAGNOSIS — J9601 Acute respiratory failure with hypoxia: Secondary | ICD-10-CM

## 2018-09-23 DIAGNOSIS — D473 Essential (hemorrhagic) thrombocythemia: Secondary | ICD-10-CM

## 2018-09-23 LAB — CBC
HCT: 30.6 % — ABNORMAL LOW (ref 36.0–46.0)
Hemoglobin: 9.7 g/dL — ABNORMAL LOW (ref 12.0–15.0)
MCH: 28.1 pg (ref 26.0–34.0)
MCHC: 31.7 g/dL (ref 30.0–36.0)
MCV: 88.7 fL (ref 80.0–100.0)
Platelets: 428 10*3/uL — ABNORMAL HIGH (ref 150–400)
RBC: 3.45 MIL/uL — ABNORMAL LOW (ref 3.87–5.11)
RDW: 17.2 % — ABNORMAL HIGH (ref 11.5–15.5)
WBC: 21.6 10*3/uL — ABNORMAL HIGH (ref 4.0–10.5)
nRBC: 0.2 % (ref 0.0–0.2)

## 2018-09-23 LAB — BASIC METABOLIC PANEL
Anion gap: 12 (ref 5–15)
BUN: 5 mg/dL — ABNORMAL LOW (ref 8–23)
CO2: 22 mmol/L (ref 22–32)
Calcium: 8 mg/dL — ABNORMAL LOW (ref 8.9–10.3)
Chloride: 102 mmol/L (ref 98–111)
Creatinine, Ser: 0.45 mg/dL (ref 0.44–1.00)
GFR calc Af Amer: >60 mL/min (ref >60)
GFR calc non Af Amer: >60 mL/min (ref >60)
Glucose, Bld: 112 mg/dL — ABNORMAL HIGH (ref 70–99)
Potassium: 3.4 mmol/L — ABNORMAL LOW (ref 3.5–5.1)
Sodium: 136 mmol/L (ref 135–145)

## 2018-09-23 LAB — GLUCOSE, CAPILLARY
Glucose-Capillary: 111 mg/dL — ABNORMAL HIGH (ref 70–99)
Glucose-Capillary: 169 mg/dL — ABNORMAL HIGH (ref 70–99)
Glucose-Capillary: 163 mg/dL — ABNORMAL HIGH (ref 70–99)
Glucose-Capillary: 147 mg/dL — ABNORMAL HIGH (ref 70–99)
Glucose-Capillary: 145 mg/dL — ABNORMAL HIGH (ref 70–99)

## 2018-09-23 MED ORDER — SODIUM CHLORIDE 0.9 % IV SOLN
INTRAVENOUS | Status: AC
  Administered 2018-09-23 – 2018-09-24 (×2): via INTRAVENOUS

## 2018-09-23 NOTE — Progress Notes (Signed)
PROGRESS NOTE  LEYLANI DULEY LFY:[id number] DOB: 05/01/1932 DOA: 09/17/2018 PCP: [name], MD    Brief Narrative:  [name] a 85 y.o.femalewith a known history of Alzheimer's dementia, hypertension, hyperlipidemia, GERD, diabetespresents to the emergency department for evaluation of fever, cough, shortness of breath. Patient was sent to the emergency department by her nursing home because she had developed symptoms of fever, shortness of breath and cough. Chest x-ray done today was read as pneumonia. Her white blood cell count was 24,000. Of note she was tested negative for COVID 2 days ago at her facility.  Patient did have a positive COVID contact, her nurse aide tested positive on 6/10.  Due to patient's high risk history, patient had a repeat COVID test which returned negative.   per pcp notes, patient has low grade fever at the facility, she has a temperature of 100.3 on presentation, no fever since in the hospital, acute hypoxia has resolved ( per pcp o2 80% on room air at pcp's office, her o2 is 87% room air initially on presentation to the ED)  leukocytosis persists. She has poor oral intake  HPI/Recap of past 24 hours:  Demented elderly, denies pain, no fever , no hypoxia She does not eat much, needs to be fed, but does not have overt signs of aspiration per RN  She has persistent leukocytosis She appear dry  Assessment/Plan: Active Problems:   HCAP (healthcare-associated pneumonia)  Acute hypoxic respiratory failure/ from pneumonia? -cxr with "Left pleural effusion and basilar consolidation, probably pneumonia." -MBS on 6/22 showed mild aspiration risk -urine culture negative, blood culture negative, mrsa screening negative, covid testing negative x1 from the facility, negative x2 since admitted to the hospital, (reports exposure on 6/10) -no fever the last 6 days, hypoxia has resolved, I did not hear cough today,  procalcitonin has been negative, she is  treated with vanc/rocephin/zithromax x1 then zosyn to cover possible aspiration pneumonia, today is day 7 of abx, will d/c abx, observe off abx -repeat procalcitonin, inflammatory marks in am, consider repeat cxr two view in am  Persistent leukocytosis/normocytic anemia/mild thrombocytosis -cbc was normal in 05/2018 -she does not appear septic, she does appear dehydrated on exam with poor oral intake -will restart gentle hydration, repeat cbc with diff in am, repeat procalcitonin, inflammatory marks in am, consider repeat cxr two view in am -consider hematology consult  Diet controlled dm2 On ssi in the hospital, stable  Dementia: on namenda and exelon appear progressive per pcp's note.  Per pT note, She requires  "frequent verbal and tactile cueing to complete STS safely following cueing for hand placement, Pt able to ambulate short durartion (6ft) from bed to chair.  Short shuffled gait patterns noted.  Upon standing infront of chair, pt increased confusion on next task.  Verbal and tactile cueing for safe mechanics to sit in chair." "Pt was limited by fatigue following transfer to chair, no reports of pain through session" Per RN, patient needs to be fed, needs cueing to swallow, though does not appear to have frank aspiration episode Will get calorie count, will likely need palliative care consult for goals of care to prevent rehospitalization.  Code Status: full  Family Communication: patient   Disposition Plan: start calorie count, start hydration, concerns for decreased oral intake due to progressive dementia,    Consultants:  Speech therapist  Social worker  Procedures:  MBS  Antibiotics: vanc/rocephin/zithromax x1 then zosyn to cover possible aspiration pneumonia, today is day 7 of abx, abx  d/ced on 6/24   Objective: BP 133/69 (BP Location: Left Arm)   Pulse 86   Temp 98.8 F (37.1 C) (Oral)   Resp 16   Ht 5' (1.524 m)   Wt 60 kg   SpO2 93%   BMI 25.83 kg/m    Intake/Output Summary (Last 24 hours) at 09/23/2018 1604 Last data filed at 09/23/2018 1416 Gross per 24 hour  Intake 461.03 ml  Output 0 ml  Net 461.03 ml   Filed Weights   09/18/18 0120  Weight: 60 kg    Exam: Patient is examined daily including today on 09/23/2018, exams remain the same as of yesterday except that has changed    General:  Frail, calm, NAD, oriented to self  Cardiovascular: RRR  Respiratory: diminished at basis, no wheezing, no rhonchi, no rales  Abdomen: Soft/ND/NT, positive BS  Musculoskeletal: No Edema  Neuro: alert, oriented to self only, calm and cooperative  Data Reviewed: Basic Metabolic Panel: Recent Labs  Lab 09/18/18 0120  09/19/18 0427 09/20/18 0338 09/21/18 0329 09/22/18 0509 09/23/18 0336  NA  --    < > 138 127* 137 136 136  K  --    < > 3.6 2.9* 3.2* 3.8 3.4*  CL  --    < > 105 92* 103 104 102  CO2  --    < > 25 25 25 24 22   GLUCOSE  --    < > 128* 135* 113* 104* 112*  BUN  --    < > 5* <5* <5* <5* 5*  CREATININE 0.51   < > 0.46 0.45 0.41* 0.41* 0.45  CALCIUM  --    < > 7.5* 6.9* 7.5* 7.8* 8.0*  MG 1.9  --  2.1  --   --   --   --   PHOS 1.7*  --  1.5*  --   --   --   --    < > = values in this interval not displayed.   Liver Function Tests: Recent Labs  Lab 09/17/18 1934 09/19/18 0427  AST 48*  --   ALT 29  --   ALKPHOS 168*  --   BILITOT 1.3*  --   PROT 8.0  --   ALBUMIN 2.7* 2.3*   No results for input(s): LIPASE, AMYLASE in the last 168 hours. Recent Labs  Lab 09/18/18 1010  AMMONIA 24   CBC: Recent Labs  Lab 09/17/18 1934  09/19/18 0427 09/20/18 0338 09/21/18 0329 09/22/18 0509 09/23/18 0336  WBC 25.6*   < > 21.6* 24.6* 21.8* 20.8* 21.6*  NEUTROABS 21.1*  --  16.7*  --   --   --   --   HGB 11.0*   < > 9.1* 9.3* 9.5* 9.1* 9.7*  HCT 34.6*   < > 29.4* 30.1* 31.2* 30.0* 30.6*  MCV 89.2   < > 90.5 90.1 90.4 90.1 88.7  PLT 466*   < > 445* 464* 472* 468* 428*   < > = values in this interval not displayed.    Cardiac Enzymes:   No results for input(s): CKTOTAL, CKMB, CKMBINDEX, TROPONINI in the last 168 hours. BNP (last 3 results) No results for input(s): BNP in the last 8760 hours.  ProBNP (last 3 results) No results for input(s): PROBNP in the last 8760 hours.  CBG: Recent Labs  Lab 09/22/18 1204 09/22/18 1603 09/22/18 2238 09/23/18 0738 09/23/18 1112  GLUCAP 107* 109* 112* 111* 169*    Recent Results (from the  past 240 hour(s))  Blood Culture (routine x 2)     Status: None   Collection Time: 09/17/18  7:34 PM   Specimen: BLOOD LEFT WRIST  Result Value Ref Range Status   Specimen Description   Final    BLOOD LEFT WRIST Performed at [facility], [address] [place], [place] [address]    Special Requests   Final    BOTTLES DRAWN AEROBIC AND ANAEROBIC Blood Culture results may not be optimal due to an inadequate volume of blood received in culture bottles Performed at [place] [address], [place], [place] [address]    Culture   Final    NO GROWTH 5 DAYS Performed at [facility], [place] [address] [place], [place] [address]    Report Status 09/22/2018 FINAL  Final  SARS Coronavirus 2 (CEPHEID- Performed in [facility] hospital lab), Hosp Order     Status: None   Collection Time: 09/17/18  7:35 PM   Specimen: Nasopharyngeal Swab  Result Value Ref Range Status   SARS Coronavirus 2 NEGATIVE NEGATIVE Final    Comment: (NOTE) If result is NEGATIVE SARS-CoV-2 target nucleic acids are NOT DETECTED. The SARS-CoV-2 RNA is generally detectable in upper and lower  respiratory specimens during the acute phase of infection. The lowest  concentration of SARS-CoV-2 viral copies this assay can detect is 250  copies / mL. A negative result does not preclude SARS-CoV-2 infection  and should not be used as the sole basis for treatment or other  patient management decisions.  A negative result may occur with  improper specimen collection /  handling, submission of specimen other  than nasopharyngeal swab, presence of viral mutation(s) within the  areas targeted by this assay, and inadequate number of viral copies  (<250 copies / mL). A negative result must be combined with clinical  observations, patient history, and epidemiological information. If result is POSITIVE SARS-CoV-2 target nucleic acids are DETECTED. The SARS-CoV-2 RNA is generally detectable in upper and lower  respiratory specimens dur ing the acute phase of infection.  Positive  results are indicative of active infection with SARS-CoV-2.  Clinical  correlation with patient history and other diagnostic information is  necessary to determine patient infection status.  Positive results do  not rule out bacterial infection or co-infection with other viruses. If result is PRESUMPTIVE POSTIVE SARS-CoV-2 nucleic acids MAY BE PRESENT.   A presumptive positive result was obtained on the submitted specimen  and confirmed on repeat testing.  While 2019 novel coronavirus  (SARS-CoV-2) nucleic acids may be present in the submitted sample  additional confirmatory testing may be necessary for epidemiological  and / or clinical management purposes  to differentiate between  SARS-CoV-2 and other Sarbecovirus currently known to infect humans.  If clinically indicated additional testing with an alternate test  methodology [phone]) is advised. The SARS-CoV-2 RNA is generally  detectable in upper and lower respiratory sp ecimens during the acute  phase of infection. The expected result is Negative. Fact Sheet for Patients:  StrictlyIdeas.no Fact Sheet for Healthcare Providers: BankingDealers.co.za This test is not yet approved or cleared by the [place] FDA and has been authorized for detection and/or diagnosis of SARS-CoV-2 by FDA under an Emergency Use Authorization (EUA).  This EUA will remain in effect (meaning this  test can be used) for the duration of the COVID-19 declaration under Section 564(b)(1) of the Act, 21 U.S.C. section 360bbb-3(b)(1), unless the authorization is terminated or  revoked sooner. Performed at [facility], [place] [address], [place], [place] [address]   Blood Culture (routine x 2)     Status: None   Collection Time: 09/17/18  7:39 PM   Specimen: BLOOD RIGHT FOREARM  Result Value Ref Range Status   Specimen Description   Final    BLOOD RIGHT FOREARM Performed at [facility], [place] [address] [place], [place] [address]    Special Requests   Final    Blood Culture results may not be optimal due to an inadequate volume of blood received in culture bottles BOTTLES DRAWN AEROBIC AND ANAEROBIC Performed at [facility], [place] [address], [place], [place] [address]    Culture   Final    NO GROWTH 5 DAYS Performed at [facility], [place] [address] [place], [place] [address]    Report Status 09/22/2018 FINAL  Final  MRSA PCR Screening     Status: None   Collection Time: 09/18/18 10:45 AM   Specimen: Nasopharyngeal  Result Value Ref Range Status   MRSA by PCR NEGATIVE NEGATIVE Final    Comment:        The GeneXpert MRSA Assay (FDA approved for NASAL specimens only), is one component of a comprehensive MRSA colonization surveillance program. It is not intended to diagnose MRSA infection nor to guide or monitor treatment for MRSA infections. Performed at [facility], [place] [address], [place], [place] [address]   SARS Coronavirus 2 (CEPHEID - Performed in [facility] hospital lab), Hosp Order     Status: None   Collection Time: 09/18/18  1:01 PM   Specimen: Nasopharyngeal Swab  Result Value Ref Range Status   SARS Coronavirus 2 NEGATIVE NEGATIVE Final    Comment: (NOTE) If result is NEGATIVE SARS-CoV-2 target nucleic acids are NOT DETECTED. The SARS-CoV-2 RNA is generally detectable in upper and lower   respiratory specimens during the acute phase of infection. The lowest  concentration of SARS-CoV-2 viral copies this assay can detect is 250  copies / mL. A negative result does not preclude SARS-CoV-2 infection  and should not be used as the sole basis for treatment or other  patient management decisions.  A negative result may occur with  improper specimen collection / handling, submission of specimen other  than nasopharyngeal swab, presence of viral mutation(s) within the  areas targeted by this assay, and inadequate number of viral copies  (<250 copies / mL). A negative result must be combined with clinical  observations, patient history, and epidemiological information. If result is POSITIVE SARS-CoV-2 target nucleic acids are DETECTED. The SARS-CoV-2 RNA is generally detectable in upper and lower  respiratory specimens dur ing the acute phase of infection.  Positive  results are indicative of active infection with SARS-CoV-2.  Clinical  correlation with patient history and other diagnostic information is  necessary to determine patient infection status.  Positive results do  not rule out bacterial infection or co-infection with other viruses. If result is PRESUMPTIVE POSTIVE SARS-CoV-2 nucleic acids MAY BE PRESENT.   A presumptive positive result was obtained on the submitted specimen  and confirmed on repeat testing.  While 2019 novel coronavirus  (SARS-CoV-2) nucleic acids may be present in the submitted sample  additional confirmatory testing may be necessary for epidemiological  and / or clinical management purposes  to differentiate between  SARS-CoV-2 and other Sarbecovirus currently known to infect humans.  If clinically indicated additional testing with an alternate test  methodology [phone]) is advised. The SARS-CoV-2 RNA is generally  detectable in upper and lower respiratory sp ecimens during the acute  phase of infection. The expected result is Negative. Fact  Sheet for Patients:  StrictlyIdeas.no Fact Sheet for Healthcare Providers: BankingDealers.co.za This test is not yet approved or cleared by the [place] FDA and has been authorized for detection and/or diagnosis of SARS-CoV-2 by FDA under an Emergency Use Authorization (EUA).  This EUA will remain in effect (meaning this test can be used) for the duration of the COVID-19 declaration under Section 564(b)(1) of the Act, 21 U.S.C. section 360bbb-3(b)(1), unless the authorization is terminated or revoked sooner. Performed at [facility], [place] [address], [place], [place] [address]   Urine culture     Status: None   Collection Time: 09/19/18  6:07 AM   Specimen: Urine, Clean Catch  Result Value Ref Range Status   Specimen Description   Final    URINE, CLEAN CATCH Performed at [facility], [place] [address] [place], [place] [address]    Special Requests   Final    NONE Performed at [facility], [place] [address], [place], [place] [address]    Culture   Final    NO GROWTH Performed at [facility], [place] [address], [place], [place] [address]    Report Status 09/20/2018 FINAL  Final     Studies: No results found.  Scheduled Meds: . aspirin EC  81 mg Oral Daily  . calcium-vitamin D  1 tablet Oral Q breakfast  . enoxaparin (LOVENOX) injection  40 mg Subcutaneous Q24H  . insulin aspart  0-5 Units Subcutaneous QHS  . insulin aspart  0-9 Units Subcutaneous TID WC  . loratadine  10 mg Oral Daily  . memantine  28 mg Oral Daily  . omega-3 acid ethyl esters  1 g Oral Daily  . rivastigmine  9.5 mg Transdermal Daily    Continuous Infusions: . sodium chloride    . piperacillin-tazobactam (ZOSYN)  IV 3.375 g (09/23/18 1118)     Time spent: 38mins I have personally reviewed and interpreted on  09/23/2018 daily labs,  imagings as discussed above under date review session and  assessment and plans.  I reviewed all nursing notes, pharmacy notes, consultant notes,  vitals, pertinent old records  I have discussed plan of care as described above with RN , patient  on 09/23/2018   Florencia Reasons MD, PhD  [facility] Pager [phone]. If 7PM-7AM, please contact night-coverage at [email or web address], password [facility] 09/23/2018, 4:04 PM  LOS: 6 days

## 2018-09-24 ENCOUNTER — Inpatient Hospital Stay (HOSPITAL_COMMUNITY): Payer: Medicare Other

## 2018-09-24 LAB — CBC WITH DIFFERENTIAL/PLATELET
Abs Immature Granulocytes: 1.9 10*3/uL — ABNORMAL HIGH (ref 0.00–0.07)
Basophils Absolute: 0.1 10*3/uL (ref 0.0–0.1)
Basophils Relative: 0 %
Eosinophils Absolute: 0.4 10*3/uL (ref 0.0–0.5)
Eosinophils Relative: 2 %
HCT: 31.9 % — ABNORMAL LOW (ref 36.0–46.0)
Hemoglobin: 9.8 g/dL — ABNORMAL LOW (ref 12.0–15.0)
Immature Granulocytes: 10 %
Lymphocytes Relative: 11 %
Lymphs Abs: 2.1 10*3/uL (ref 0.7–4.0)
MCH: 28 pg (ref 26.0–34.0)
MCHC: 30.7 g/dL (ref 30.0–36.0)
MCV: 91.1 fL (ref 80.0–100.0)
Monocytes Absolute: 1.5 10*3/uL — ABNORMAL HIGH (ref 0.1–1.0)
Monocytes Relative: 8 %
Neutro Abs: 13 10*3/uL — ABNORMAL HIGH (ref 1.7–7.7)
Neutrophils Relative %: 69 %
Platelets: 384 10*3/uL (ref 150–400)
RBC: 3.5 MIL/uL — ABNORMAL LOW (ref 3.87–5.11)
RDW: 17.6 % — ABNORMAL HIGH (ref 11.5–15.5)
WBC: 19.1 10*3/uL — ABNORMAL HIGH (ref 4.0–10.5)
nRBC: 0.2 % (ref 0.0–0.2)

## 2018-09-24 LAB — BASIC METABOLIC PANEL
Anion gap: 10 (ref 5–15)
BUN: 5 mg/dL — ABNORMAL LOW (ref 8–23)
CO2: 21 mmol/L — ABNORMAL LOW (ref 22–32)
Calcium: 7.9 mg/dL — ABNORMAL LOW (ref 8.9–10.3)
Chloride: 107 mmol/L (ref 98–111)
Creatinine, Ser: 0.4 mg/dL — ABNORMAL LOW (ref 0.44–1.00)
GFR calc Af Amer: >60 mL/min (ref >60)
GFR calc non Af Amer: >60 mL/min (ref >60)
Glucose, Bld: 130 mg/dL — ABNORMAL HIGH (ref 70–99)
Potassium: 3 mmol/L — ABNORMAL LOW (ref 3.5–5.1)
Sodium: 138 mmol/L (ref 135–145)

## 2018-09-24 LAB — HEPATIC FUNCTION PANEL
ALT: 20 U/L (ref 0–44)
AST: 25 U/L (ref 15–41)
Albumin: 2.3 g/dL — ABNORMAL LOW (ref 3.5–5.0)
Alkaline Phosphatase: 86 U/L (ref 38–126)
Bilirubin, Direct: <0.1 mg/dL (ref 0.0–0.2)
Total Bilirubin: 0.2 mg/dL — ABNORMAL LOW (ref 0.3–1.2)
Total Protein: 6.5 g/dL (ref 6.5–8.1)

## 2018-09-24 LAB — LACTATE DEHYDROGENASE: LDH: 160 U/L (ref 98–192)

## 2018-09-24 LAB — D-DIMER, QUANTITATIVE: D-Dimer, Quant: 5.5 ug/mL-FEU — ABNORMAL HIGH (ref 0.00–0.50)

## 2018-09-24 LAB — GLUCOSE, CAPILLARY
Glucose-Capillary: 123 mg/dL — ABNORMAL HIGH (ref 70–99)
Glucose-Capillary: 117 mg/dL — ABNORMAL HIGH (ref 70–99)
Glucose-Capillary: 182 mg/dL — ABNORMAL HIGH (ref 70–99)
Glucose-Capillary: 179 mg/dL — ABNORMAL HIGH (ref 70–99)

## 2018-09-24 LAB — SAVE SMEAR(SSMR), FOR PROVIDER SLIDE REVIEW

## 2018-09-24 LAB — PROCALCITONIN: Procalcitonin: 0.11 ng/mL

## 2018-09-24 LAB — MAGNESIUM: Magnesium: 1.8 mg/dL (ref 1.7–2.4)

## 2018-09-24 LAB — FERRITIN: Ferritin: 549 ng/mL — ABNORMAL HIGH (ref 11–307)

## 2018-09-24 MED ORDER — ADULT MULTIVITAMIN W/MINERALS CH
1.0000 | ORAL_TABLET | Freq: Every day | ORAL | Status: DC
  Administered 2018-09-24 – 2018-09-25 (×2): 1 via ORAL
  Filled 2018-09-24 (×3): qty 1

## 2018-09-24 MED ORDER — POTASSIUM CHLORIDE CRYS ER 20 MEQ PO TBCR
40.0000 meq | EXTENDED_RELEASE_TABLET | Freq: Once | ORAL | Status: AC
  Administered 2018-09-24: 40 meq via ORAL
  Filled 2018-09-24: qty 2

## 2018-09-24 MED ORDER — ENSURE ENLIVE PO LIQD
237.0000 mL | Freq: Two times a day (BID) | ORAL | Status: DC
  Administered 2018-09-24 – 2018-09-27 (×5): 237 mL via ORAL

## 2018-09-24 NOTE — NC FL2 (Addendum)
[place] LEVEL OF CARE SCREENING TOOL     IDENTIFICATION  Patient Name: Grace Gilbert Birthdate: 11-09-1932 Sex: female Admission Date (Current Location): 09/17/2018  [facility] and [place] Number:  Herbalist and Address:  [facility],  [place] [address], [place]      Provider Number: [id number]  Attending Physician Name and Address:  Florencia Reasons, MD  Relative Name and Phone Number:       Current Level of Care: Hospital Recommended Level of Care: [place] Prior Approval Number:    Date Approved/Denied: 09/22/18 PASRR Number: [id number]  Discharge Plan: SNF    Current Diagnoses: Patient Active Problem List   Diagnosis Date Noted  . Acute hypoxemic respiratory failure ([place])   . Lobar pneumonia ([place])   . FTT (failure to thrive) in adult   . Vascular dementia without behavioral disturbance ([place])   . Leukocytosis   . Normocytic anemia   . Thrombocytosis ([place])   . HCAP (healthcare-associated pneumonia) 09/17/2018  . Exposure to Covid-19 Virus 09/17/2018  . Symptoms of urinary tract infection 09/14/2018  . Allergic rhinitis 06/02/2017  . History of TIA (transient ischemic attack) 03/05/2017  . History of rotator cuff tear 03/05/2017  . Age-related osteoporosis without current pathological fracture 12/09/2016  . Prediabetes 11/20/2016  . Hypertension   . Alzheimer's dementia ([place])   . HLD (hyperlipidemia)   . Esophageal stricture     Orientation RESPIRATION BLADDER Height & Weight     Self  Normal Incontinent Weight: 132 lb 4.4 oz (60 kg) Height:  5' (152.4 cm)  BEHAVIORAL SYMPTOMS/MOOD NEUROLOGICAL BOWEL NUTRITION STATUS      Incontinent Diet(See dc summary)  AMBULATORY STATUS COMMUNICATION OF NEEDS Skin   Extensive Assist Verbally Normal                       Personal Care Assistance Level of Assistance  Bathing, Feeding, Dressing Bathing Assistance: Limited assistance Feeding assistance:  Limited assistance Dressing Assistance: Limited assistance     Functional Limitations Info  Sight, Hearing, Speech Sight Info: Impaired Hearing Info: Adequate Speech Info: Adequate    SPECIAL CARE FACTORS FREQUENCY  PT (By licensed PT), OT (By licensed OT)     PT Frequency: 5X/WEEK OT Frequency: 5X/WEEK            Contractures Contractures Info: Not present    Additional Factors Info  Code Status, Allergies Code Status Info: Full Allergies Info: Aricept (Donepezil Hcl), Crestor (Rosuvastatin Calcium), Lipitor (Atorvastatin Calcium), Lovastatin, Micardis (Telmisartan), Sertraline, Welchol (Colesevelam Hcl), Zetia (Ezetimibe), Zocor (Simvastatin)           Current Medications (09/24/2018):  This is the current hospital active medication list Current Facility-Administered Medications  Medication Dose Route Frequency Provider Last Rate Last Dose  . acetaminophen (TYLENOL) tablet 650 mg  650 mg Oral Q6H PRN Hugelmeyer, Alexis, DO   650 mg at 09/21/18 1558   Or  . acetaminophen (TYLENOL) suppository 650 mg  650 mg Rectal Q6H PRN Hugelmeyer, Alexis, DO      . aspirin EC tablet 81 mg  81 mg Oral Daily Hugelmeyer, Alexis, DO   81 mg at 09/24/18 1046  . benzonatate (TESSALON) capsule 100 mg  100 mg Oral TID PRN Hugelmeyer, Alexis, DO   100 mg at 09/21/18 1558  . bisacodyl (DULCOLAX) EC tablet 5 mg  5 mg Oral Daily PRN Hugelmeyer, Alexis, DO      . calcium-vitamin D (OSCAL WITH  D) 500-200 MG-UNIT per tablet 1 tablet  1 tablet Oral Q breakfast Hugelmeyer, Alexis, DO   1 tablet at 09/24/18 0847  . enoxaparin (LOVENOX) injection 40 mg  40 mg Subcutaneous Q24H Hugelmeyer, Alexis, DO   40 mg at 09/24/18 1046  . feeding supplement (ENSURE ENLIVE) (ENSURE ENLIVE) liquid 237 mL  237 mL Oral BID BM Florencia Reasons, MD      . insulin aspart (novoLOG) injection 0-5 Units  0-5 Units Subcutaneous QHS Hugelmeyer, Alexis, DO      . insulin aspart (novoLOG) injection 0-9 Units  0-9 Units Subcutaneous TID  WC Hugelmeyer, Alexis, DO   1 Units at 09/24/18 0847  . ipratropium-albuterol (DUONEB) 0.5-2.5 (3) MG/3ML nebulizer solution 3 mL  3 mL Nebulization Q6H PRN Hugelmeyer, Alexis, DO      . loratadine (CLARITIN) tablet 10 mg  10 mg Oral Daily Hugelmeyer, Alexis, DO   10 mg at 09/24/18 1047  . magnesium citrate solution 1 Bottle  1 Bottle Oral Once PRN Hugelmeyer, Alexis, DO      . memantine (NAMENDA XR) 24 hr capsule 28 mg  28 mg Oral Daily Hugelmeyer, Alexis, DO   28 mg at 09/24/18 1047  . multivitamin with minerals tablet 1 tablet  1 tablet Oral Daily Florencia Reasons, MD      . omega-3 acid ethyl esters (LOVAZA) capsule 1 g  1 g Oral Daily Hugelmeyer, Alexis, DO   1 g at 09/24/18 1046  . ondansetron (ZOFRAN) tablet 4 mg  4 mg Oral Q6H PRN Hugelmeyer, Alexis, DO       Or  . ondansetron (ZOFRAN) injection 4 mg  4 mg Intravenous Q6H PRN Hugelmeyer, Alexis, DO      . potassium chloride SA (K-DUR) CR tablet 40 mEq  40 mEq Oral Once Florencia Reasons, MD      . rivastigmine (EXELON) 9.5 mg/24hr 9.5 mg  9.5 mg Transdermal Daily Hugelmeyer, Alexis, DO   9.5 mg at 09/24/18 1048  . senna-docusate (Senokot-S) tablet 1 tablet  1 tablet Oral QHS PRN Hugelmeyer, Alexis, DO         Discharge Medications: Please see discharge summary for a list of discharge medications.  Relevant Imaging Results:  Relevant Lab Results:   Additional Information [id number]  Lia Hopping, LCSW

## 2018-09-24 NOTE — Progress Notes (Addendum)
PROGRESS NOTE  Grace Gilbert JSE:[id number] DOB: [birth year] DOA: 09/17/2018 PCP: [name], MD    Brief Narrative:  [name] a 85 y.o.femalewith a known history of Alzheimer's dementia, hypertension, hyperlipidemia, GERD, diabetespresents to the emergency department for evaluation of fever, cough, shortness of breath. Patient was sent to the emergency department by her nursing home because she had developed symptoms of fever, shortness of breath and cough. Chest x-ray done today was read as pneumonia. Her white blood cell count was 24,000. Of note she was tested negative for COVID 2 days ago at her facility.  Patient did have a positive COVID contact, her nurse aide tested positive on 6/10.  Due to patient's high risk history, patient had a repeat COVID test which returned negative.   per pcp notes, patient has low grade fever at the facility, she has a temperature of 100.3 on presentation, no fever since in the hospital, acute hypoxia has resolved ( per pcp o2 80% on room air at pcp's office, her o2 is 87% room air initially on presentation to the ED)  leukocytosis persists. She has poor oral intake  HPI/Recap of past 24 hours:  Demented elderly, denies pain, no fever , no hypoxia, she points to her abdomen, but does not seem to have tender on exam She does not eat much, needs to be fed, but does not have overt signs of aspiration per RN  She has persistent leukocytosis, she does not cough, no fever,  She appear dry, she is started on ivf  Assessment/Plan: Active Problems:   HCAP (healthcare-associated pneumonia)   Acute hypoxemic respiratory failure (HCC)   Lobar pneumonia (HCC)   FTT (failure to thrive) in adult   Vascular dementia without behavioral disturbance (HCC)   Leukocytosis   Normocytic anemia   Thrombocytosis (HCC)  Acute hypoxic respiratory failure/ from pneumonia? -cxr with "Left pleural effusion and basilar consolidation, probably pneumonia."  -MBS on 6/22 showed mild aspiration risk -urine culture negative, blood culture negative, mrsa screening negative, covid testing negative x1 from the facility, negative x2 since admitted to the hospital, (reports exposure on 6/10) -no fever the last 6 days, hypoxia has resolved, I did not hear cough today,  procalcitonin has been negative, she is treated with vanc/rocephin/zithromax x1 then zosyn to cover possible aspiration pneumonia, today is day 7 of abx, will d/c abx, observe off abx -repeat procalcitonin remain low at 0.1, repeat  inflammatory marks has improved with decreasing ddimer and ferritin,  repeat cxr , add on kub and bladder scan  Persistent leukocytosis/normocytic anemia/mild thrombocytosis -cbc was normal in 05/2018 -she does not appear septic, she does appear dehydrated on exam with poor oral intake -on gentle hydration, repeat procalcitonin remain low at 0.1, repeat  inflammatory marks has improved with decreasing ddimer and ferritin,  repeat cxr , add on kub and bladder scan  Hypokalemia: replace k  Diet controlled dm2 On ssi in the hospital, stable  Dementia: on namenda and exelon appear progressive per pcp's note.  Per pT note, She requires  "frequent verbal and tactile cueing to complete STS safely following cueing for hand placement, Pt able to ambulate short durartion (69ft) from bed to chair.  Short shuffled gait patterns noted.  Upon standing infront of chair, pt increased confusion on next task.  Verbal and tactile cueing for safe mechanics to sit in chair." "Pt was limited by fatigue following transfer to chair, no reports of pain through session" Per RN, patient needs to be fed, needs  cueing to swallow, though does not appear to have frank aspiration episode Will get calorie count, will likely need palliative care consult for goals of care to prevent rehospitalization.  Code Status: full  Family Communication: patient   Disposition Plan: start calorie count,  start hydration, concerns for decreased oral intake due to progressive dementia,  snf with palliative care once medically stable  Consultants:  Speech therapist  Social worker  Procedures:  MBS  Antibiotics: vanc/rocephin/zithromax x1 then zosyn to cover possible aspiration pneumonia, abx d/ced on 6/24   Objective: BP (!) 150/64 (BP Location: Right Arm)   Pulse 69   Temp (!) 97.3 F (36.3 C) (Oral)   Resp 18   Ht 5' (1.524 m)   Wt 60 kg   SpO2 91%   BMI 25.83 kg/m   Intake/Output Summary (Last 24 hours) at 09/24/2018 1223 Last data filed at 09/24/2018 1100 Gross per 24 hour  Intake 2232.94 ml  Output 900 ml  Net 1332.94 ml   Filed Weights   09/18/18 0120  Weight: 60 kg    Exam: Patient is examined daily including today on 09/24/2018, exams remain the same as of yesterday except that has changed    General:  Frail, calm, NAD, oriented to self  Cardiovascular: RRR  Respiratory: diminished at basis, no wheezing, no rhonchi, no rales  Abdomen: Soft/ND/NT, positive BS  Musculoskeletal: No Edema  Neuro: alert, oriented to self only, calm and cooperative  Data Reviewed: Basic Metabolic Panel: Recent Labs  Lab 09/18/18 0120  09/19/18 0427 09/20/18 0338 09/21/18 0329 09/22/18 0509 09/23/18 0336 09/24/18 0626  NA  --    < > 138 127* 137 136 136 138  K  --    < > 3.6 2.9* 3.2* 3.8 3.4* 3.0*  CL  --    < > 105 92* 103 104 102 107  CO2  --    < > 25 25 25 24 22  21*  GLUCOSE  --    < > 128* 135* 113* 104* 112* 130*  BUN  --    < > 5* <5* <5* <5* 5* 5*  CREATININE 0.51   < > 0.46 0.45 0.41* 0.41* 0.45 0.40*  CALCIUM  --    < > 7.5* 6.9* 7.5* 7.8* 8.0* 7.9*  MG 1.9  --  2.1  --   --   --   --  1.8  PHOS 1.7*  --  1.5*  --   --   --   --   --    < > = values in this interval not displayed.   Liver Function Tests: Recent Labs  Lab 09/17/18 1934 09/19/18 0427 09/24/18 0626  AST 48*  --  25  ALT 29  --  20  ALKPHOS 168*  --  86  BILITOT 1.3*  --  0.2*   PROT 8.0  --  6.5  ALBUMIN 2.7* 2.3* 2.3*   No results for input(s): LIPASE, AMYLASE in the last 168 hours. Recent Labs  Lab 09/18/18 1010  AMMONIA 24   CBC: Recent Labs  Lab 09/17/18 1934  09/19/18 0427 09/20/18 0338 09/21/18 0329 09/22/18 0509 09/23/18 0336 09/24/18 0626  WBC 25.6*   < > 21.6* 24.6* 21.8* 20.8* 21.6* 19.1*  NEUTROABS 21.1*  --  16.7*  --   --   --   --  13.0*  HGB 11.0*   < > 9.1* 9.3* 9.5* 9.1* 9.7* 9.8*  HCT 34.6*   < > 29.4* 30.1*  31.2* 30.0* 30.6* 31.9*  MCV 89.2   < > 90.5 90.1 90.4 90.1 88.7 91.1  PLT 466*   < > 445* 464* 472* 468* 428* 384   < > = values in this interval not displayed.   Cardiac Enzymes:   No results for input(s): CKTOTAL, CKMB, CKMBINDEX, TROPONINI in the last 168 hours. BNP (last 3 results) No results for input(s): BNP in the last 8760 hours.  ProBNP (last 3 results) No results for input(s): PROBNP in the last 8760 hours.  CBG: Recent Labs  Lab 09/23/18 1112 09/23/18 1627 09/23/18 2129 09/23/18 2221 09/24/18 0719  GLUCAP 169* 163* 147* 145* 123*    Recent Results (from the past 240 hour(s))  Blood Culture (routine x 2)     Status: None   Collection Time: 09/17/18  7:34 PM   Specimen: BLOOD LEFT WRIST  Result Value Ref Range Status   Specimen Description   Final    BLOOD LEFT WRIST Performed at [facility] [address] [place], [place] [address]    Special Requests   Final    BOTTLES DRAWN AEROBIC AND ANAEROBIC Blood Culture results may not be optimal due to an inadequate volume of blood received in culture bottles Performed at [facility] [address], [place], [place] [address]    Culture   Final    NO GROWTH 5 DAYS Performed at [facility], [place] [address], [place], [place] [address]    Report Status 09/22/2018 FINAL  Final  SARS Coronavirus 2 (CEPHEID- Performed in [facility] hospital lab), Hosp Order     Status: None   Collection Time: 09/17/18  7:35 PM    Specimen: Nasopharyngeal Swab  Result Value Ref Range Status   SARS Coronavirus 2 NEGATIVE NEGATIVE Final    Comment: (NOTE) If result is NEGATIVE SARS-CoV-2 target nucleic acids are NOT DETECTED. The SARS-CoV-2 RNA is generally detectable in upper and lower  respiratory specimens during the acute phase of infection. The lowest  concentration of SARS-CoV-2 viral copies this assay can detect is 250  copies / mL. A negative result does not preclude SARS-CoV-2 infection  and should not be used as the sole basis for treatment or other  patient management decisions.  A negative result may occur with  improper specimen collection / handling, submission of specimen other  than nasopharyngeal swab, presence of viral mutation(s) within the  areas targeted by this assay, and inadequate number of viral copies  (<250 copies / mL). A negative result must be combined with clinical  observations, patient history, and epidemiological information. If result is POSITIVE SARS-CoV-2 target nucleic acids are DETECTED. The SARS-CoV-2 RNA is generally detectable in upper and lower  respiratory specimens dur ing the acute phase of infection.  Positive  results are indicative of active infection with SARS-CoV-2.  Clinical  correlation with patient history and other diagnostic information is  necessary to determine patient infection status.  Positive results do  not rule out bacterial infection or co-infection with other viruses. If result is PRESUMPTIVE POSTIVE SARS-CoV-2 nucleic acids MAY BE PRESENT.   A presumptive positive result was obtained on the submitted specimen  and confirmed on repeat testing.  While 2019 novel coronavirus  (SARS-CoV-2) nucleic acids may be present in the submitted sample  additional confirmatory testing may be necessary for epidemiological  and / or clinical management purposes  to differentiate between  SARS-CoV-2 and other Sarbecovirus currently known to infect humans.  If  clinically indicated additional testing with an alternate test  methodology [phone]) is advised. The SARS-CoV-2 RNA is generally  detectable in upper and lower respiratory sp ecimens during the acute  phase of infection. The expected result is Negative. Fact Sheet for Patients:  StrictlyIdeas.no Fact Sheet for Healthcare Providers: BankingDealers.co.za This test is not yet approved or cleared by the [place] FDA and has been authorized for detection and/or diagnosis of SARS-CoV-2 by FDA under an Emergency Use Authorization (EUA).  This EUA will remain in effect (meaning this test can be used) for the duration of the COVID-19 declaration under Section 564(b)(1) of the Act, 21 U.S.C. section 360bbb-3(b)(1), unless the authorization is terminated or revoked sooner. Performed at [facility], [place] [address] [place], [place] [address]   Blood Culture (routine x 2)     Status: None   Collection Time: 09/17/18  7:39 PM   Specimen: BLOOD RIGHT FOREARM  Result Value Ref Range Status   Specimen Description   Final    BLOOD RIGHT FOREARM Performed at [facility], [place] [address], [place], [place] [address]    Special Requests   Final    Blood Culture results may not be optimal due to an inadequate volume of blood received in culture bottles BOTTLES DRAWN AEROBIC AND ANAEROBIC Performed at [facility], [place] [address], [place], [place] [address]    Culture   Final    NO GROWTH 5 DAYS Performed at [facility], [place] [address], [place], [place] [address]    Report Status 09/22/2018 FINAL  Final  MRSA PCR Screening     Status: None   Collection Time: 09/18/18 10:45 AM   Specimen: Nasopharyngeal  Result Value Ref Range Status   MRSA by PCR NEGATIVE NEGATIVE Final    Comment:        The GeneXpert MRSA Assay (FDA approved for NASAL specimens only), is one component of a comprehensive MRSA  colonization surveillance program. It is not intended to diagnose MRSA infection nor to guide or monitor treatment for MRSA infections. Performed at [facility], [place] [address], [place], [place] [address]   SARS Coronavirus 2 (CEPHEID - Performed in [facility] hospital lab), Hosp Order     Status: None   Collection Time: 09/18/18  1:01 PM   Specimen: Nasopharyngeal Swab  Result Value Ref Range Status   SARS Coronavirus 2 NEGATIVE NEGATIVE Final    Comment: (NOTE) If result is NEGATIVE SARS-CoV-2 target nucleic acids are NOT DETECTED. The SARS-CoV-2 RNA is generally detectable in upper and lower  respiratory specimens during the acute phase of infection. The lowest  concentration of SARS-CoV-2 viral copies this assay can detect is 250  copies / mL. A negative result does not preclude SARS-CoV-2 infection  and should not be used as the sole basis for treatment or other  patient management decisions.  A negative result may occur with  improper specimen collection / handling, submission of specimen other  than nasopharyngeal swab, presence of viral mutation(s) within the  areas targeted by this assay, and inadequate number of viral copies  (<250 copies / mL). A negative result must be combined with clinical  observations, patient history, and epidemiological information. If result is POSITIVE SARS-CoV-2 target nucleic acids are DETECTED. The SARS-CoV-2 RNA is generally detectable in upper and lower  respiratory specimens dur ing the acute phase of infection.  Positive  results are indicative of active infection with SARS-CoV-2.  Clinical  correlation with patient history and other diagnostic information is  necessary to determine patient infection status.  Positive results do  not rule out bacterial infection or co-infection with other viruses. If result is PRESUMPTIVE POSTIVE SARS-CoV-2 nucleic acids MAY BE PRESENT.   A presumptive positive result was obtained  on the submitted specimen  and confirmed on repeat testing.  While 2019 novel coronavirus  (SARS-CoV-2) nucleic acids may be present in the submitted sample  additional confirmatory testing may be necessary for epidemiological  and / or clinical management purposes  to differentiate between  SARS-CoV-2 and other Sarbecovirus currently known to infect humans.  If clinically indicated additional testing with an alternate test  methodology [phone]) is advised. The SARS-CoV-2 RNA is generally  detectable in upper and lower respiratory sp ecimens during the acute  phase of infection. The expected result is Negative. Fact Sheet for Patients:  StrictlyIdeas.no Fact Sheet for Healthcare Providers: BankingDealers.co.za This test is not yet approved or cleared by the [place] FDA and has been authorized for detection and/or diagnosis of SARS-CoV-2 by FDA under an Emergency Use Authorization (EUA).  This EUA will remain in effect (meaning this test can be used) for the duration of the COVID-19 declaration under Section 564(b)(1) of the Act, 21 U.S.C. section 360bbb-3(b)(1), unless the authorization is terminated or revoked sooner. Performed at [facility], [place] [address], [place], [place] [address]   Urine culture     Status: None   Collection Time: 09/19/18  6:07 AM   Specimen: Urine, Clean Catch  Result Value Ref Range Status   Specimen Description   Final    URINE, CLEAN CATCH Performed at [facility], [place] [address], [place], [place] [address]    Special Requests   Final    NONE Performed at [facility], [place] [address] [place], [place] [address]    Culture   Final    NO GROWTH Performed at [facility], [place] [address], [place], [place] [address]    Report Status 09/20/2018 FINAL  Final     Studies: No results found.  Scheduled Meds: . aspirin EC  81 mg Oral  Daily  . calcium-vitamin D  1 tablet Oral Q breakfast  . enoxaparin (LOVENOX) injection  40 mg Subcutaneous Q24H  . feeding supplement (ENSURE ENLIVE)  237 mL Oral BID BM  . insulin aspart  0-5 Units Subcutaneous QHS  . insulin aspart  0-9 Units Subcutaneous TID WC  . loratadine  10 mg Oral Daily  . memantine  28 mg Oral Daily  . multivitamin with minerals  1 tablet Oral Daily  . omega-3 acid ethyl esters  1 g Oral Daily  . potassium chloride  40 mEq Oral Once  . rivastigmine  9.5 mg Transdermal Daily    Continuous Infusions: . sodium chloride 75 mL/hr at 09/24/18 1100     Time spent: 55mins I have personally reviewed and interpreted on  09/24/2018 daily labs,  imagings as discussed above under date review session and assessment and plans.  I reviewed all nursing notes, pharmacy notes, consultant notes,  vitals, pertinent old records  I have discussed plan of care as described above with RN , patient  on 09/24/2018   Florencia Reasons MD, PhD  [facility] Pager [phone]. If 7PM-7AM, please contact night-coverage at [email or web address], password [facility] 09/24/2018, 12:23 PM  LOS: 7 days

## 2018-09-24 NOTE — Progress Notes (Signed)
Initial Nutrition Assessment  RD working remotely.   DOCUMENTATION CODES:   Not applicable  INTERVENTION:  - will order Ensure Enlive BID, each supplement provides 350 kcal and 20 grams of protein. - will order Magic Cup BID with meals, each supplement provides 290 kcal and 9 grams of protein. - will order daily multivitamin with minerals. - continue to encourage PO intakes and feeding assistance to be provided as needed. - RD will follow-up 6/26 with Day #2 Calorie Count results.    NUTRITION DIAGNOSIS:   Inadequate oral intake related to acute illness, decreased appetite as evidenced by other (comment)(RN report).  GOAL:   Patient will meet greater than or equal to 90% of their needs  MONITOR:   PO intake, Supplement acceptance, Labs, Weight trends  REASON FOR ASSESSMENT:   Consult Calorie Count  ASSESSMENT:   85 year-old female with a known history of Alzheimer's dementia, HTN, hyperlipidemia, GERD, and DM who presented to the ED for evaluation of fever, cough, and SOB. Patient was sent to the emergency department by her nursing home because she had developed symptoms of fever, SOB, and cough. CXR indicated PNA. Patient did have a positive COVID-19 contact PTA and she is at high risk.  Patient admitted on 6/19 and has not been weighed since that time. Patient is on contact and droplet precautions for r/o COVID-19. RN at bedside feeding patient breakfast; made aware of calorie count. RN reports that patient has not been eating well and will not eat unless someone stands at bedside and feeds her. She reports that patient has had 4 negative COVID-19 tests but is still considered a r/o and is on precautions for this reason.   RD did not enter patient's room and spoke with RN from the door way. Per flow sheet, patient is a/o to self only. Per chart review, current weight is 132 lb and weight on 4/7 was 137 lb. This indicates 5 lb weight loss (3.6% body weight) in the past 2.5  months; not significant for time frame.   Patient consumed 25% of breakfast and dinner and 50% of lunch yesterday (6/24) which provided a total of 598 kcal (40% estimated kcal need) and 31.5 grams protein (48% estimated protein need). No ONS ordered currently so will place orders as outlined above.     Medications reviewed; 1 tablet Oscal-D/day, sliding scale novolog, 1 g lovaza/day. Labs reviewed; CBG: 123 mg/dl today, K: 3 mmol/l, BUN: 5 mg/dl, creatinine: 0.4 mg/dl, Ca: 7.9 mg/dl.  IVF; NS @ 75 ml/hr.     NUTRITION - FOCUSED PHYSICAL EXAM:  unable to complete at this time.   Diet Order:   Diet Order            DIET DYS 2 Room service appropriate? No; Fluid consistency: Thin  Diet effective now              EDUCATION NEEDS:   Not appropriate for education at this time  Skin:  Skin Assessment: Reviewed RN Assessment  Last BM:  6/24  Height:   Ht Readings from Last 1 Encounters:  09/18/18 5' (1.524 m)    Weight:   Wt Readings from Last 1 Encounters:  09/18/18 60 kg    Ideal Body Weight:  45.4 kg  BMI:  Body mass index is 25.83 kg/m.  Estimated Nutritional Needs:   Kcal:  1500-1700 kcal  Protein:  65-75 grams  Fluid:  >/= 1.8 L/day      Jarome Matin, MS, RD, LDN, CNSC  Inpatient Clinical Dietitian Pager # [phone] After hours/weekend pager # [phone]

## 2018-09-25 ENCOUNTER — Inpatient Hospital Stay (HOSPITAL_COMMUNITY): Payer: Medicare Other

## 2018-09-25 DIAGNOSIS — J90 Pleural effusion, not elsewhere classified: Secondary | ICD-10-CM

## 2018-09-25 LAB — LACTATE DEHYDROGENASE, PLEURAL OR PERITONEAL FLUID: LD, Fluid: 250 U/L — ABNORMAL HIGH (ref 3–23)

## 2018-09-25 LAB — BODY FLUID CELL COUNT WITH DIFFERENTIAL
Eos, Fluid: 0 %
Lymphs, Fluid: 10 %
Monocyte-Macrophage-Serous Fluid: 30 % — ABNORMAL LOW (ref 50–90)
Neutrophil Count, Fluid: 60 % — ABNORMAL HIGH (ref 0–25)
Total Nucleated Cell Count, Fluid: 1390 cu mm — ABNORMAL HIGH (ref 0–1000)

## 2018-09-25 LAB — CBC WITH DIFFERENTIAL/PLATELET
Abs Immature Granulocytes: 1.39 10*3/uL — ABNORMAL HIGH (ref 0.00–0.07)
Basophils Absolute: 0.2 10*3/uL — ABNORMAL HIGH (ref 0.0–0.1)
Basophils Relative: 1 %
Eosinophils Absolute: 0.4 10*3/uL (ref 0.0–0.5)
Eosinophils Relative: 2 %
HCT: 32.9 % — ABNORMAL LOW (ref 36.0–46.0)
Hemoglobin: 10 g/dL — ABNORMAL LOW (ref 12.0–15.0)
Immature Granulocytes: 8 %
Lymphocytes Relative: 12 %
Lymphs Abs: 2.2 10*3/uL (ref 0.7–4.0)
MCH: 27.2 pg (ref 26.0–34.0)
MCHC: 30.4 g/dL (ref 30.0–36.0)
MCV: 89.6 fL (ref 80.0–100.0)
Monocytes Absolute: 1.5 10*3/uL — ABNORMAL HIGH (ref 0.1–1.0)
Monocytes Relative: 8 %
Neutro Abs: 11.7 10*3/uL — ABNORMAL HIGH (ref 1.7–7.7)
Neutrophils Relative %: 69 %
Platelets: 407 10*3/uL — ABNORMAL HIGH (ref 150–400)
RBC: 3.67 MIL/uL — ABNORMAL LOW (ref 3.87–5.11)
RDW: 17.5 % — ABNORMAL HIGH (ref 11.5–15.5)
WBC: 17.3 10*3/uL — ABNORMAL HIGH (ref 4.0–10.5)
nRBC: 0.2 % (ref 0.0–0.2)

## 2018-09-25 LAB — BASIC METABOLIC PANEL
Anion gap: 8 (ref 5–15)
BUN: 6 mg/dL — ABNORMAL LOW (ref 8–23)
CO2: 24 mmol/L (ref 22–32)
Calcium: 8.8 mg/dL — ABNORMAL LOW (ref 8.9–10.3)
Chloride: 107 mmol/L (ref 98–111)
Creatinine, Ser: 0.36 mg/dL — ABNORMAL LOW (ref 0.44–1.00)
GFR calc Af Amer: >60 mL/min (ref >60)
GFR calc non Af Amer: >60 mL/min (ref >60)
Glucose, Bld: 140 mg/dL — ABNORMAL HIGH (ref 70–99)
Potassium: 3.8 mmol/L (ref 3.5–5.1)
Sodium: 139 mmol/L (ref 135–145)

## 2018-09-25 LAB — GLUCOSE, CAPILLARY
Glucose-Capillary: 125 mg/dL — ABNORMAL HIGH (ref 70–99)
Glucose-Capillary: 144 mg/dL — ABNORMAL HIGH (ref 70–99)
Glucose-Capillary: 99 mg/dL (ref 70–99)
Glucose-Capillary: 107 mg/dL — ABNORMAL HIGH (ref 70–99)

## 2018-09-25 LAB — PROCALCITONIN: Procalcitonin: <0.1 ng/mL

## 2018-09-25 LAB — NOVEL CORONAVIRUS, NAA (HOSP ORDER, SEND-OUT TO REF LAB; TAT 18-24 HRS): SARS-CoV-2, NAA: NOT DETECTED

## 2018-09-25 LAB — GLUCOSE, PLEURAL OR PERITONEAL FLUID: Glucose, Fluid: 155 mg/dL

## 2018-09-25 LAB — PROTEIN, PLEURAL OR PERITONEAL FLUID: Total protein, fluid: 4.7 g/dL

## 2018-09-25 LAB — MAGNESIUM: Magnesium: 1.9 mg/dL (ref 1.7–2.4)

## 2018-09-25 MED ORDER — LIDOCAINE HCL 1 % IJ SOLN
INTRAMUSCULAR | Status: AC
  Filled 2018-09-25: qty 10

## 2018-09-25 MED ORDER — PANTOPRAZOLE SODIUM 20 MG PO TBEC
20.0000 mg | DELAYED_RELEASE_TABLET | Freq: Every day | ORAL | Status: DC
  Administered 2018-09-25: 20 mg via ORAL
  Filled 2018-09-25 (×4): qty 1

## 2018-09-25 NOTE — Progress Notes (Signed)
Ultrasound reported successful thoracentesis completed. Pt now en route to X-ray for esophageal testing.

## 2018-09-25 NOTE — Progress Notes (Signed)
PROGRESS NOTE  Grace Gilbert CXK:[id number] DOB: 07/16/1932 DOA: 09/17/2018 PCP: [name], MD    Brief Narrative:  [name] a 85 y.o.femalewith a known history of Alzheimer's dementia, hypertension, hyperlipidemia, GERD, diabetespresents to the emergency department for evaluation of fever, cough, shortness of breath. Patient was sent to the emergency department by her nursing home because she had developed symptoms of fever, shortness of breath and cough. Chest x-ray done today was read as pneumonia. Her white blood cell count was 24,000. Of note she was tested negative for COVID 2 days ago at her facility.  Patient did have a positive COVID contact, her nurse aide tested positive on 6/10.  Due to patient's high risk history, patient had a repeat COVID test which returned negative.   per pcp notes, patient has low grade fever at the facility, she has a temperature of 100.3 on presentation, no fever since in the hospital, acute hypoxia has resolved ( per pcp o2 80% on room air at pcp's office, her o2 is 87% room air initially on presentation to the ED)  leukocytosis persists. She has poor oral intake  HPI/Recap of past 24 hours:  Demented elderly, denies pain, no fever , no hypoxia,  She does not eat much, needs to be fed, but does not have overt signs of  Oropharyngeal phase aspiration per RN and repeat speech eval   Ct chest show "Large left-sided pleural effusion with near complete collapse of the left lower lobe." She has persistent leukocytosis, she does not cough, no fever,    Assessment/Plan: Active Problems:   HCAP (healthcare-associated pneumonia)   Acute hypoxemic respiratory failure (HCC)   Lobar pneumonia (HCC)   FTT (failure to thrive) in adult   Vascular dementia without behavioral disturbance (HCC)   Leukocytosis   Normocytic anemia   Thrombocytosis (HCC)  Acute hypoxic respiratory failure/ from pneumonia?/with left sided pleural effusion -cxr  with "Left pleural effusion and basilar consolidation, probably pneumonia." -MBS on 6/22 showed mild aspiration risk -urine culture negative, blood culture negative, mrsa screening negative, covid testing negative x1 from the facility, negative x3 since admitted to the hospital, (reports exposure on 6/10) -reports low grade fever of 100.3 at her nursing home but no fever in the hospital hypoxia has resolved, procalcitonin has been negative, she is treated with vanc/rocephin/zithromax x1 then zosyn to cover possible aspiration pneumonia, she finished total of 7 days treatment as of 6/24, observe off abx  -repeat procalcitonin remain low at 0.1, repeat  inflammatory marks has improved with decreasing ddimer and ferritin,   -however, repeat ct chest with large left sided pleural effusion and fluid-filled esophagus" will proceed with thoracentesis and dg esophagus   Persistent leukocytosis/normocytic anemia/mild thrombocytosis -cbc was normal in 05/2018 -she does not appear septic, she does appear dehydrated on exam with poor oral intake -she received entle hydration, repeat procalcitonin remain low at 0.1, repeat  inflammatory marks has improved with decreasing ddimer and ferritin,  Wbc remain elevated  CT chest "With large left sided pleural effusion with near complete collapse of the left lower lobe. Fluid-filled esophagus to the level of the upper thorax, which places the patient at risk for aspiration. There are a few hyperdense foci within the collapsed left lower lobe which could represent aspirated contrast."  Will get US guided thoracentesis with fluids study Per Speech there is no significant oropharyngeal phase aspiration, will start ppi to decrease reflux, will get dg esophagus  Hypokalemia: replace k, improved  Diet controlled dm2  On ssi in the hospital, stable  Dementia: on namenda and exelon appear progressive per pcp's note.  Per pT note, She requires  "frequent verbal and  tactile cueing to complete STS safely following cueing for hand placement, Pt able to ambulate short durartion (19ft) from bed to chair.  Short shuffled gait patterns noted.  Upon standing infront of chair, pt increased confusion on next task.  Verbal and tactile cueing for safe mechanics to sit in chair." "Pt was limited by fatigue following transfer to chair, no reports of pain through session" Per RN, patient needs to be fed, needs cueing to swallow, though does not appear to have frank aspiration episode Will get calorie count, will likely need palliative care consult for goals of care to prevent rehospitalization.  Code Status: full  Family Communication: patient   Disposition Plan: start calorie count, start hydration, concerns for decreased oral intake due to progressive dementia,  snf with palliative care once medically stable  Consultants:  Speech therapist  Social worker  Procedures:  MBS  Antibiotics: vanc/rocephin/zithromax x1 then zosyn to cover possible aspiration pneumonia, abx d/ced on 6/24   Objective: BP (!) 156/67 (BP Location: Left Arm)   Pulse 76   Temp 97.9 F (36.6 C) (Oral)   Resp 16   Ht 5' (1.524 m)   Wt 60 kg   SpO2 93%   BMI 25.83 kg/m   Intake/Output Summary (Last 24 hours) at 09/25/2018 1109 Last data filed at 09/25/2018 0946 Gross per 24 hour  Intake 657.72 ml  Output 500 ml  Net 157.72 ml   Filed Weights   09/18/18 0120  Weight: 60 kg    Exam: Patient is examined daily including today on 09/25/2018, exams remain the same as of yesterday except that has changed    General:  Frail, calm, NAD, oriented to self  Cardiovascular: RRR  Respiratory: diminished at basis, no wheezing, no rhonchi, no rales  Abdomen: Soft/ND/NT, positive BS  Musculoskeletal: No Edema  Neuro: alert, oriented to self only, calm and cooperative  Data Reviewed: Basic Metabolic Panel: Recent Labs  Lab 09/19/18 0427  09/21/18 0329 09/22/18 0509 09/23/18  0336 09/24/18 0626 09/25/18 0340  NA 138   < > 137 136 136 138 139  K 3.6   < > 3.2* 3.8 3.4* 3.0* 3.8  CL 105   < > 103 104 102 107 107  CO2 25   < > 25 24 22  21* 24  GLUCOSE 128*   < > 113* 104* 112* 130* 140*  BUN 5*   < > <5* <5* 5* 5* 6*  CREATININE 0.46   < > 0.41* 0.41* 0.45 0.40* 0.36*  CALCIUM 7.5*   < > 7.5* 7.8* 8.0* 7.9* 8.8*  MG 2.1  --   --   --   --  1.8 1.9  PHOS 1.5*  --   --   --   --   --   --    < > = values in this interval not displayed.   Liver Function Tests: Recent Labs  Lab 09/19/18 0427 09/24/18 0626  AST  --  25  ALT  --  20  ALKPHOS  --  86  BILITOT  --  0.2*  PROT  --  6.5  ALBUMIN 2.3* 2.3*   No results for input(s): LIPASE, AMYLASE in the last 168 hours. No results for input(s): AMMONIA in the last 168 hours. CBC: Recent Labs  Lab 09/19/18 0427  09/21/18 0329 09/22/18  9518 09/23/18 0336 09/24/18 0626 09/25/18 0340  WBC 21.6*   < > 21.8* 20.8* 21.6* 19.1* 17.3*  NEUTROABS 16.7*  --   --   --   --  13.0* 11.7*  HGB 9.1*   < > 9.5* 9.1* 9.7* 9.8* 10.0*  HCT 29.4*   < > 31.2* 30.0* 30.6* 31.9* 32.9*  MCV 90.5   < > 90.4 90.1 88.7 91.1 89.6  PLT 445*   < > 472* 468* 428* 384 407*   < > = values in this interval not displayed.   Cardiac Enzymes:   No results for input(s): CKTOTAL, CKMB, CKMBINDEX, TROPONINI in the last 168 hours. BNP (last 3 results) No results for input(s): BNP in the last 8760 hours.  ProBNP (last 3 results) No results for input(s): PROBNP in the last 8760 hours.  CBG: Recent Labs  Lab 09/24/18 0719 09/24/18 1133 09/24/18 1719 09/24/18 2152 09/25/18 0720  GLUCAP 123* 117* 182* 179* 125*    Recent Results (from the past 240 hour(s))  Blood Culture (routine x 2)     Status: None   Collection Time: 09/17/18  7:34 PM   Specimen: BLOOD LEFT WRIST  Result Value Ref Range Status   Specimen Description   Final    BLOOD LEFT WRIST Performed at [facility] [address] [place], [place] [address]     Special Requests   Final    BOTTLES DRAWN AEROBIC AND ANAEROBIC Blood Culture results may not be optimal due to an inadequate volume of blood received in culture bottles Performed at [facility] [address], [place], [place] [address]    Culture   Final    NO GROWTH 5 DAYS Performed at [facility], [place] [address], [place],  [address]    Report Status 09/22/2018 FINAL  Final  SARS Coronavirus 2 (CEPHEID- Performed in [facility] hospital lab), Hosp Order     Status: None   Collection Time: 09/17/18  7:35 PM   Specimen: Nasopharyngeal Swab  Result Value Ref Range Status   SARS Coronavirus 2 NEGATIVE NEGATIVE Final    Comment: (NOTE) If result is NEGATIVE SARS-CoV-2 target nucleic acids are NOT DETECTED. The SARS-CoV-2 RNA is generally detectable in upper and lower  respiratory specimens during the acute phase of infection. The lowest  concentration of SARS-CoV-2 viral copies this assay can detect is 250  copies / mL. A negative result does not preclude SARS-CoV-2 infection  and should not be used as the sole basis for treatment or other  patient management decisions.  A negative result may occur with  improper specimen collection / handling, submission of specimen other  than nasopharyngeal swab, presence of viral mutation(s) within the  areas targeted by this assay, and inadequate number of viral copies  (<250 copies / mL). A negative result must be combined with clinical  observations, patient history, and epidemiological information. If result is POSITIVE SARS-CoV-2 target nucleic acids are DETECTED. The SARS-CoV-2 RNA is generally detectable in upper and lower  respiratory specimens dur ing the acute phase of infection.  Positive  results are indicative of active infection with SARS-CoV-2.  Clinical  correlation with patient history and other diagnostic information is  necessary to determine patient infection status.  Positive results do   not rule out bacterial infection or co-infection with other viruses. If result is PRESUMPTIVE POSTIVE SARS-CoV-2 nucleic acids MAY BE PRESENT.   A presumptive positive result was obtained on the submitted  specimen  and confirmed on repeat testing.  While 2019 novel coronavirus  (SARS-CoV-2) nucleic acids may be present in the submitted sample  additional confirmatory testing may be necessary for epidemiological  and / or clinical management purposes  to differentiate between  SARS-CoV-2 and other Sarbecovirus currently known to infect humans.  If clinically indicated additional testing with an alternate test  methodology [phone]) is advised. The SARS-CoV-2 RNA is generally  detectable in upper and lower respiratory sp ecimens during the acute  phase of infection. The expected result is Negative. Fact Sheet for Patients:  StrictlyIdeas.no Fact Sheet for Healthcare Providers: BankingDealers.co.za This test is not yet approved or cleared by the [place] FDA and has been authorized for detection and/or diagnosis of SARS-CoV-2 by FDA under an Emergency Use Authorization (EUA).  This EUA will remain in effect (meaning this test can be used) for the duration of the COVID-19 declaration under Section 564(b)(1) of the Act, 21 U.S.C. section 360bbb-3(b)(1), unless the authorization is terminated or revoked sooner. Performed at [facility], [place] [address], [place], [place] [address]   Blood Culture (routine x 2)     Status: None   Collection Time: 09/17/18  7:39 PM   Specimen: BLOOD RIGHT FOREARM  Result Value Ref Range Status   Specimen Description   Final    BLOOD RIGHT FOREARM Performed at [facility], [place] [address] [place], [place] [address]    Special Requests   Final    Blood Culture results may not be optimal due to an inadequate volume of blood received in culture bottles BOTTLES DRAWN AEROBIC AND  ANAEROBIC Performed at [facility], [place] [address] [place], [place] [address]    Culture   Final    NO GROWTH 5 DAYS Performed at [facility], [place] [address], [place], [place] [address]    Report Status 09/22/2018 FINAL  Final  MRSA PCR Screening     Status: None   Collection Time: 09/18/18 10:45 AM   Specimen: Nasopharyngeal  Result Value Ref Range Status   MRSA by PCR NEGATIVE NEGATIVE Final    Comment:        The GeneXpert MRSA Assay (FDA approved for NASAL specimens only), is one component of a comprehensive MRSA colonization surveillance program. It is not intended to diagnose MRSA infection nor to guide or monitor treatment for MRSA infections. Performed at [facility], [place] [address], [place], [place] [address]   SARS Coronavirus 2 (CEPHEID - Performed in [facility] hospital lab), Hosp Order     Status: None   Collection Time: 09/18/18  1:01 PM   Specimen: Nasopharyngeal Swab  Result Value Ref Range Status   SARS Coronavirus 2 NEGATIVE NEGATIVE Final    Comment: (NOTE) If result is NEGATIVE SARS-CoV-2 target nucleic acids are NOT DETECTED. The SARS-CoV-2 RNA is generally detectable in upper and lower  respiratory specimens during the acute phase of infection. The lowest  concentration of SARS-CoV-2 viral copies this assay can detect is 250  copies / mL. A negative result does not preclude SARS-CoV-2 infection  and should not be used as the sole basis for treatment or other  patient management decisions.  A negative result may occur with  improper specimen collection / handling, submission of specimen other  than nasopharyngeal swab, presence of viral mutation(s) within the  areas targeted by this assay, and inadequate number of viral copies  (<250 copies / mL). A negative  result must be combined with clinical  observations, patient history, and epidemiological information. If result is POSITIVE SARS-CoV-2 target  nucleic acids are DETECTED. The SARS-CoV-2 RNA is generally detectable in upper and lower  respiratory specimens dur ing the acute phase of infection.  Positive  results are indicative of active infection with SARS-CoV-2.  Clinical  correlation with patient history and other diagnostic information is  necessary to determine patient infection status.  Positive results do  not rule out bacterial infection or co-infection with other viruses. If result is PRESUMPTIVE POSTIVE SARS-CoV-2 nucleic acids MAY BE PRESENT.   A presumptive positive result was obtained on the submitted specimen  and confirmed on repeat testing.  While 2019 novel coronavirus  (SARS-CoV-2) nucleic acids may be present in the submitted sample  additional confirmatory testing may be necessary for epidemiological  and / or clinical management purposes  to differentiate between  SARS-CoV-2 and other Sarbecovirus currently known to infect humans.  If clinically indicated additional testing with an alternate test  methodology [phone]) is advised. The SARS-CoV-2 RNA is generally  detectable in upper and lower respiratory sp ecimens during the acute  phase of infection. The expected result is Negative. Fact Sheet for Patients:  StrictlyIdeas.no Fact Sheet for Healthcare Providers: BankingDealers.co.za This test is not yet approved or cleared by the [place] FDA and has been authorized for detection and/or diagnosis of SARS-CoV-2 by FDA under an Emergency Use Authorization (EUA).  This EUA will remain in effect (meaning this test can be used) for the duration of the COVID-19 declaration under Section 564(b)(1) of the Act, 21 U.S.C. section 360bbb-3(b)(1), unless the authorization is terminated or revoked sooner. Performed at [facility], [place] [address] [place], [place] [address]   Urine culture     Status: None   Collection Time: 09/19/18  6:07 AM    Specimen: Urine, Clean Catch  Result Value Ref Range Status   Specimen Description   Final    URINE, CLEAN CATCH Performed at [facility], [place] [address], [place], [place] [address]    Special Requests   Final    NONE Performed at [facility], [place] [address] [place], [place] [address]    Culture   Final    NO GROWTH Performed at [facility], [place] [address] [place], [place] [address]    Report Status 09/20/2018 FINAL  Final     Studies: Dg Abd 1 View  Result Date: 09/24/2018 CLINICAL DATA:  Fever cough and short of breath EXAM: ABDOMEN - 1 VIEW COMPARISON:  None. FINDINGS: Nonobstructed bowel gas pattern with contrast material in the colon and rectum. Airspace disease at the left lung base with suspected effusion. Catheter inferior to the pubic bones. IMPRESSION: Nonobstructed bowel-gas pattern with contrast material in the colon and rectum. Electronically Signed   By: Donavan Foil M.D.   On: 09/24/2018 17:16   Ct Chest Wo Contrast  Result Date: 09/24/2018 CLINICAL DATA:  Pleural effusion. EXAM: CT CHEST WITHOUT CONTRAST TECHNIQUE: Multidetector CT imaging of the chest was performed following the standard protocol without IV contrast. COMPARISON:  None. FINDINGS: Cardiovascular: No significant vascular findings. Normal heart size. No pericardial effusion. Coronary artery calcifications are noted. Aortic calcifications are noted. Mediastinum/Nodes: No enlarged mediastinal or axillary lymph nodes. The thyroid gland is unremarkable. The esophagus is fluid-filled to the level of the upper thorax. Lungs/Pleura: There is a large left-sided pleural effusion. There is near complete collapse of the left  lower lobe. Multiple calcifications are noted within the collapsed left lower lobe. There is a trace right-sided pleural effusion. The trachea is unremarkable. Upper Abdomen: No acute abnormality. Musculoskeletal: No chest wall mass or suspicious bone  lesions identified. IMPRESSION: 1. Evaluation is limited by motion artifact and lack of IV contrast. 2. Large left-sided pleural effusion with near complete collapse of the left lower lobe. 3. Trace right-sided pleural effusion. 4. Fluid-filled esophagus to the level of the upper thorax, which places the patient at risk for aspiration. There are a few hyperdense foci within the collapsed left lower lobe which could represent aspirated contrast. Aortic Atherosclerosis (ICD10-I70.0). Electronically Signed   By: Constance Holster M.D.   On: 09/24/2018 19:10   Dg Chest Port 1 View  Result Date: 09/24/2018 CLINICAL DATA:  Fever cough and short of breath EXAM: PORTABLE CHEST 1 VIEW COMPARISON:  09/17/2018, 06/17/2018, 03/29/2013 FINDINGS: Moderate left-sided pleural effusion. Dense airspace disease at the lingula and left base. Pleural and parenchymal scarring at the right base. Cardiomegaly. Aortic atherosclerosis. No pneumothorax. IMPRESSION: Moderate left pleural effusion with dense consolidation at the lingula and left base, atelectasis or pneumonia. Findings do not appear significantly changed as compared with 09/17/2018 Electronically Signed   By: Donavan Foil M.D.   On: 09/24/2018 17:18    Scheduled Meds: . aspirin EC  81 mg Oral Daily  . calcium-vitamin D  1 tablet Oral Q breakfast  . feeding supplement (ENSURE ENLIVE)  237 mL Oral BID BM  . insulin aspart  0-5 Units Subcutaneous QHS  . insulin aspart  0-9 Units Subcutaneous TID WC  . loratadine  10 mg Oral Daily  . memantine  28 mg Oral Daily  . multivitamin with minerals  1 tablet Oral Daily  . omega-3 acid ethyl esters  1 g Oral Daily  . pantoprazole  20 mg Oral Daily  . rivastigmine  9.5 mg Transdermal Daily    Continuous Infusions:    Time spent: 19mins I have personally reviewed and interpreted on  09/25/2018 daily labs,  imagings as discussed above under date review session and assessment and plans.  I reviewed all nursing  notes, pharmacy notes, consultant notes,  vitals, pertinent old records  I have discussed plan of care as described above with RN , patient  on 09/25/2018   Florencia Reasons MD, PhD  [facility] Pager [phone]. If 7PM-7AM, please contact night-coverage at [email or web address], password [facility] 09/25/2018, 11:09 AM  LOS: 8 days

## 2018-09-25 NOTE — Progress Notes (Signed)
Notified MD that ordered procedures/testing pending Covid test results. Verified with lab that results were sent out to [employer] [facility] [facility] and still pending.

## 2018-09-25 NOTE — Progress Notes (Signed)
Pt resting quietly in bed after return from procedures/testing. No signs of distress or complications noted.

## 2018-09-25 NOTE — Progress Notes (Signed)
Notified radiology dept that Covid results are "not detected". Pt will have tests/procedures tomorrow.

## 2018-09-25 NOTE — Progress Notes (Signed)
Calorie Count Note  48 hour calorie count ordered. Day 2 results.  Diet: Dysphagia 2 Supplements:  -Ensure Enlive BID -Magic Cup BID  6/25: Breakfast: 25% - 180 kcal, 7g protein Lunch: 25% - 180 kcal, 7g protein Dinner: not documented Supplements: 1/2 Ensure - 175 kcal, 10g protein  Total intake: 535 kcal (35% of minimum estimated needs)  24g protein (36% of minimum estimated needs)  Estimated Nutritional Needs:  Kcal:  1500-1700 kcal Protein:  65-75 grams  Nutrition Dx: Inadequate oral intake related to acute illness, decreased appetite as evidenced by other (comment)(RN report).  Goal: Pt to meet >/= 90% of their estimated nutrition needs.  Intervention:  -Continue Ensure Enlive BID -Continue Magic Cups BID -Continue to encourage PO intakes and feeding assistance   Clayton Bibles, MS, RD, LDN [place] [facility] Pager: [phone] After Hours Pager: [phone]

## 2018-09-25 NOTE — Progress Notes (Signed)
Pt off floor to ultrasound for thoracentesis.

## 2018-09-25 NOTE — Evaluation (Signed)
Clinical/Bedside Swallow Evaluation Patient Details  Name: ALAZE GARVERICK MRN: [id number] Date of Birth: 1932/07/21  Today's Date: 09/25/2018 Time: SLP Start Time (ACUTE ONLY): 6294 SLP Stop Time (ACUTE ONLY): 0931 SLP Time Calculation (min) (ACUTE ONLY): 53 min  Past Medical History:  Past Medical History:  Diagnosis Date  . Abnormal CT scan, chest September 2011   Scar tissue  . Allergic rhinitis   . Alzheimer's dementia ([place])   . Asthma   . Diabetes mellitus    borderline and under control-diet ,exercise  . Esophageal stricture   . Family history of colon cancer    Mother  . GERD (gastroesophageal reflux disease)   . Hiatal hernia   . HLD (hyperlipidemia)   . Hypertension   . Osteopenia   . Pneumonia 12-15 yrs.ago   yrs. ago  . TIA (transient ischemic attack)   . Vitamin D deficiency    Past Surgical History:  Past Surgical History:  Procedure Laterality Date  . ABDOMINAL HYSTERECTOMY  1980   . CATARACT EXTRACTION Left 2014  . SHOULDER OPEN ROTATOR CUFF REPAIR  05/22/2011   Procedure: ROTATOR CUFF REPAIR SHOULDER OPEN;  Surgeon: Tobi Bastos, MD;  Location: [facility];  Service: Orthopedics;  Laterality: Left;  . THORACOTOMY / DECORTICATION PARIETAL PLEURA Right 204   empyema  . TONSILLECTOMY     HPI:  [name] is an 85 y.o. female with a known history of Alzheimer's dementia, hypertension, hyperlipidemia, GERD, diabetes presents to the emergency department for evaluation of fever, cough, shortness of breath.  Patient was sent to the emergency department by her nursing home because she had developed symptoms of fever, shortness of breath and cough.  Chest x-ray showed pneumonia.   Assessment / Plan / Recommendation Clinical Impression  Patient presents with a mild oropharyngeal dysphagia with mild decreased efficiency with mastication of regular solids and presumed mild delay in her swallow.  Patient's vocal quality remained clear throughout this evaluation. Given h/o  esophageal dysphagia and findings of CT with fluid filled esophagus- suspect pt's main aspiration risk is due to esophageal dysfunction.  Pt does well with SLP placing food boluses on utencil and placed in pt's left and to allow her to feed herself.  Informed NT of need to do this for the pt to allow added safety. Skilled intervention included strategies to maximize pt's safety with po.   Spoke to NT and RN and secure chatted MD re: findings.  Pt is a very inefficent consumer therefore recommend to continue to maximize liquid nutritional intake.  Given her dementia causing lack of initiation to eat, risk for dehydration and malnutrition likely present/ongoing. No SLP follow up indicated at this time.  Thanks for this order. SLP Visit Diagnosis: Dysphagia, oral phase (R13.11)    Aspiration Risk  Mild aspiration risk    Diet Recommendation Thin liquid;Dysphagia 2 (Fine chop)   Liquid Administration via: Cup;Straw Medication Administration: Whole meds with puree Supervision: Intermittent supervision to cue for compensatory strategies;Patient able to self feed Compensations: Minimize environmental distractions;Slow rate;Small sips/bites Postural Changes: Remain upright for at least 30 minutes after po intake;Seated upright at 90 degrees    Other  Recommendations Oral Care Recommendations: Oral care BID   Follow up Recommendations Skilled Nursing facility      Frequency and Duration            Prognosis        Swallow Study   General Date of Onset: 09/17/18 HPI: [name] is an 85 y.o. female  with a known history of Alzheimer's dementia, hypertension, hyperlipidemia, GERD, diabetes presents to the emergency department for evaluation of fever, cough, shortness of breath.  Patient was sent to the emergency department by her nursing home because she had developed symptoms of fever, shortness of breath and cough.  Chest x-ray showed pneumonia. Type of Study: Bedside Swallow Evaluation Previous  Swallow Assessment: BSE 09/17/2018, MBS 4 days ago with no aspiraton observed and mild penetration Diet Prior to this Study: Dysphagia 2 (chopped);Thin liquids Temperature Spikes Noted: No Respiratory Status: Room air History of Recent Intubation: No Behavior/Cognition: Alert;Cooperative;Pleasant mood;Confused Oral Cavity Assessment: Within Functional Limits Oral Care Completed by SLP: No Oral Cavity - Dentition: Adequate natural dentition Vision: Functional for self-feeding Self-Feeding Abilities: Needs set up;Needs assist Patient Positioning: Upright in bed Baseline Vocal Quality: Normal Volitional Cough: Weak Volitional Swallow: Unable to elicit    Oral/Motor/Sensory Function Overall Oral Motor/Sensory Function: Within functional limits   Ice Chips Ice chips: Not tested   Thin Liquid Thin Liquid: Within functional limits Presentation: Cup;Self Fed;Straw    Nectar Thick Nectar Thick Liquid: Not tested   Honey Thick Honey Thick Liquid: Not tested   Puree Puree: Within functional limits Presentation: Spoon   Solid     Solid: Impaired Other Comments: mildly decreased efficiency of mastication      Macario Golds 09/25/2018,11:22 AM   Luanna Salk, MS [facility] SLP Acute Rehab Services Pager [phone] Office [phone]

## 2018-09-25 NOTE — TOC Progression Note (Signed)
Transition of Care [facility]) - Progression Note    Patient Details  Name: THRESIA RAMANATHAN MRN: [id number] Date of Birth: 04/20/1932  [facility] CM/SW Contact  Servando Snare, [place] Phone Number: 09/25/2018, 1:08 PM  Clinical Narrative:   Patient not ready for dc today. Patient awaiting Covid results for repeat thoracentesis. LCSW updated facility.     Expected Discharge Plan: [place] Barriers to Discharge: Continued Medical Work up  Expected Discharge Plan and Services Expected Discharge Plan: [place] In-house Referral: NA Discharge Planning Services: NA Post Acute Care Choice: [place] Living arrangements for the past 2 months: [place] Expected Discharge Date: (unknown)                                     Social Determinants of Health (SDOH) Interventions    Readmission Risk Interventions Readmission Risk Prevention Plan 09/22/2018  Home Care Screening Complete  Some recent data might be hidden

## 2018-09-25 NOTE — Progress Notes (Signed)
Physical Therapy Treatment Patient Details Name: Grace Gilbert MRN: [id number] DOB: 12-Jan-1933 Today's Date: 09/25/2018    History of Present Illness 85 y.o. female with a known history of Alzheimer's dementia, hypertension, hyperlipidemia, GERD, diabetes presents to the emergency department for evaluation of fever, cough, shortness of breath.  Patient was sent to the emergency department by her nursing home because she had developed symptoms of fever, shortness of breath and cough.  Chest x-ray showed pneumonia.    PT Comments    Patient progressing slowly today limited by urinary incontinence and fatigue.  Continue to feel she is appropriate for SNF level rehab at d/c.  PT to follow acutely.  Follow Up Recommendations  SNF;Supervision for mobility/OOB     Equipment Recommendations  None recommended by PT    Recommendations for Other Services       Precautions / Restrictions Precautions Precautions: Fall    Mobility  Bed Mobility Overal bed mobility: Needs Assistance Bed Mobility: Supine to Sit;Rolling Rolling: Mod assist;+2 for safety/equipment   Supine to sit: Mod assist;+2 for safety/equipment     General bed mobility comments: rolling in bed for hygiene due to soiled in bed with stool  Transfers Overall transfer level: Needs assistance Equipment used: Rolling walker (2 wheeled) Transfers: Sit to/from Stand Sit to Stand: Mod assist;+2 safety/equipment Stand pivot transfers: +2 safety/equipment;Mod assist       General transfer comment: incontinent of urine in standing and gait, assist for lifting up off EOB, pt able to push up better from 3:1 with armrest and cues and increased time  Ambulation/Gait Ambulation/Gait assistance: Min assist;+2 safety/equipment Gait Distance (Feet): 4 Feet Assistive device: Rolling walker (2 wheeled) Gait Pattern/deviations: Step-to pattern;Shuffle;Trunk flexed     General Gait Details: able to right trunk some with cues for  forward gaze, incontinent and distracted by that, sat on BSC then cleaned and short steps to recliner   Stairs             Wheelchair Mobility    Modified Rankin (Stroke Patients Only)       Balance   Sitting-balance support: Feet supported;Bilateral upper extremity supported Sitting balance-Leahy Scale: Fair     Standing balance support: Bilateral upper extremity supported Standing balance-Leahy Scale: Poor Standing balance comment: reliant on UE support                            Cognition Arousal/Alertness: Awake/alert Behavior During Therapy: Flat affect Overall Cognitive Status: No family/caregiver present to determine baseline cognitive functioning                                        Exercises      General Comments        Pertinent Vitals/Pain Pain Assessment: No/denies pain Faces Pain Scale: No hurt    Home Living                      Prior Function            PT Goals (current goals can now be found in the care plan section) Progress towards PT goals: Progressing toward goals    Frequency    Min 2X/week      PT Plan Current plan remains appropriate    Co-evaluation  AM-PAC PT "6 Clicks" Mobility   Outcome Measure  Help needed turning from your back to your side while in a flat bed without using bedrails?: A Lot Help needed moving from lying on your back to sitting on the side of a flat bed without using bedrails?: A Lot Help needed moving to and from a bed to a chair (including a wheelchair)?: A Lot Help needed standing up from a chair using your arms (e.g., wheelchair or bedside chair)?: A Lot Help needed to walk in hospital room?: A Lot Help needed climbing 3-5 steps with a railing? : Total 6 Click Score: 11    End of Session Equipment Utilized During Treatment: Gait belt Activity Tolerance: Patient limited by fatigue Patient left: in chair;with call bell/phone within  reach;with chair alarm set   PT Visit Diagnosis: Muscle weakness (generalized) (M62.81);Difficulty in walking, not elsewhere classified (R26.2)     Time: 1133-1207 PT Time Calculation (min) (ACUTE ONLY): 34 min  Charges:  $Gait Training: 8-22 mins $Therapeutic Activity: 8-22 mins                     Grace Gilbert, [place] Acute Rehabilitation Services [phone] 09/25/2018    Grace Gilbert 09/25/2018, 1:17 PM

## 2018-09-26 DIAGNOSIS — R1310 Dysphagia, unspecified: Secondary | ICD-10-CM

## 2018-09-26 DIAGNOSIS — K222 Esophageal obstruction: Secondary | ICD-10-CM

## 2018-09-26 DIAGNOSIS — K449 Diaphragmatic hernia without obstruction or gangrene: Secondary | ICD-10-CM

## 2018-09-26 LAB — CBC WITH DIFFERENTIAL/PLATELET
Abs Immature Granulocytes: 1.05 10*3/uL — ABNORMAL HIGH (ref 0.00–0.07)
Basophils Absolute: 0.2 10*3/uL — ABNORMAL HIGH (ref 0.0–0.1)
Basophils Relative: 1 %
Eosinophils Absolute: 0.5 10*3/uL (ref 0.0–0.5)
Eosinophils Relative: 3 %
HCT: 32.4 % — ABNORMAL LOW (ref 36.0–46.0)
Hemoglobin: 10 g/dL — ABNORMAL LOW (ref 12.0–15.0)
Immature Granulocytes: 7 %
Lymphocytes Relative: 15 %
Lymphs Abs: 2.3 10*3/uL (ref 0.7–4.0)
MCH: 28.2 pg (ref 26.0–34.0)
MCHC: 30.9 g/dL (ref 30.0–36.0)
MCV: 91.3 fL (ref 80.0–100.0)
Monocytes Absolute: 1.4 10*3/uL — ABNORMAL HIGH (ref 0.1–1.0)
Monocytes Relative: 9 %
Neutro Abs: 9.8 10*3/uL — ABNORMAL HIGH (ref 1.7–7.7)
Neutrophils Relative %: 65 %
Platelets: 393 10*3/uL (ref 150–400)
RBC: 3.55 MIL/uL — ABNORMAL LOW (ref 3.87–5.11)
RDW: 17.7 % — ABNORMAL HIGH (ref 11.5–15.5)
WBC: 15.2 10*3/uL — ABNORMAL HIGH (ref 4.0–10.5)
nRBC: 0.3 % — ABNORMAL HIGH (ref 0.0–0.2)

## 2018-09-26 LAB — HEMOGLOBIN A1C
Hgb A1c MFr Bld: 6.6 % — ABNORMAL HIGH (ref 4.8–5.6)
Mean Plasma Glucose: 142.72 mg/dL

## 2018-09-26 LAB — BASIC METABOLIC PANEL
Anion gap: 12 (ref 5–15)
BUN: 9 mg/dL (ref 8–23)
CO2: 23 mmol/L (ref 22–32)
Calcium: 8.8 mg/dL — ABNORMAL LOW (ref 8.9–10.3)
Chloride: 105 mmol/L (ref 98–111)
Creatinine, Ser: 0.47 mg/dL (ref 0.44–1.00)
GFR calc Af Amer: >60 mL/min (ref >60)
GFR calc non Af Amer: >60 mL/min (ref >60)
Glucose, Bld: 128 mg/dL — ABNORMAL HIGH (ref 70–99)
Potassium: 3.9 mmol/L (ref 3.5–5.1)
Sodium: 140 mmol/L (ref 135–145)

## 2018-09-26 LAB — GRAM STAIN: Gram Stain: NONE SEEN

## 2018-09-26 LAB — GLUCOSE, CAPILLARY
Glucose-Capillary: 116 mg/dL — ABNORMAL HIGH (ref 70–99)
Glucose-Capillary: 98 mg/dL (ref 70–99)
Glucose-Capillary: 101 mg/dL — ABNORMAL HIGH (ref 70–99)
Glucose-Capillary: 101 mg/dL — ABNORMAL HIGH (ref 70–99)

## 2018-09-26 MED ORDER — GERHARDT'S BUTT CREAM
TOPICAL_CREAM | Freq: Two times a day (BID) | CUTANEOUS | Status: DC | PRN
  Filled 2018-09-26: qty 1

## 2018-09-26 MED ORDER — MIRTAZAPINE 15 MG PO TBDP
15.0000 mg | ORAL_TABLET | Freq: Every day | ORAL | Status: DC
  Administered 2018-09-27: 15 mg via ORAL
  Filled 2018-09-26 (×2): qty 1

## 2018-09-26 MED ORDER — SODIUM CHLORIDE 0.9 % IV SOLN: INTRAVENOUS | Status: DC

## 2018-09-26 NOTE — Progress Notes (Signed)
Informed consent obtained for patient to have EGD tomorrow from [facility].

## 2018-09-26 NOTE — Progress Notes (Addendum)
PROGRESS NOTE  Grace Gilbert KZS:[id number] DOB: 06-Jun-1932 DOA: 09/17/2018 PCP: [name], MD    Brief Narrative:  [name] a 85 y.o.femalewith a known history of Alzheimer's dementia, hypertension, hyperlipidemia, GERD, diabetespresents to the emergency department for evaluation of fever, cough, shortness of breath. Patient was sent to the emergency department by her nursing home because she had developed symptoms of fever, shortness of breath and cough. Chest x-ray done today was read as pneumonia. Her white blood cell count was 24,000. Of note she was tested negative for COVID 2 days ago at her facility.  Patient did have a positive COVID contact, her nurse aide tested positive on 6/10.  Due to patient's high risk history, patient had a repeat COVID test which returned negative.   per pcp notes, patient has low grade fever at the facility, she has a temperature of 100.3 on presentation, no fever since in the hospital, acute hypoxia has resolved ( per pcp o2 80% on room air at pcp's office, her o2 is 87% room air initially on presentation to the ED)  leukocytosis persists. She has poor oral intake  HPI/Recap of past 24 hours:  Demented elderly, denies pain, no fever , no hypoxia,  She does not eat much, needs to be fed,   She underwent thoracentesis, wbc is coming down  Assessment/Plan: Active Problems:   HCAP (healthcare-associated pneumonia)   Acute hypoxemic respiratory failure (HCC)   Lobar pneumonia (HCC)   FTT (failure to thrive) in adult   Vascular dementia without behavioral disturbance (HCC)   Leukocytosis   Normocytic anemia   Thrombocytosis (HCC)   Pleural effusion on left  Acute hypoxic respiratory failure/ from pneumonia?/with left sided pleural effusion -cxr with "Left pleural effusion and basilar consolidation, probably pneumonia." -MBS on 6/22 showed mild aspiration risk -urine culture negative, blood culture negative, mrsa screening  negative, covid testing negative x1 from the facility, negative x3 since admitted to the hospital, (reports exposure on 6/10) -reports low grade fever of 100.3 at her nursing home but no fever in the hospital hypoxia has resolved, procalcitonin has been negative, she is treated with vanc/rocephin/zithromax x1 then zosyn to cover possible aspiration pneumonia, she finished total of 7 days treatment as of 6/24, observe off abx  -repeat procalcitonin remain low at 0.1, repeat  inflammatory marks has improved with decreasing ddimer and ferritin,   -however, repeat ct chest with large left sided pleural effusion and fluid-filled esophagus" s/p thoracentesis and dg esophagus on 6/26 -pleural fluids gram stain negative, culture no growth, cytology pending  Persistent leukocytosis/normocytic anemia/mild thrombocytosis -cbc was normal in 05/2018 -she does not appear septic, she does appear dehydrated on exam with poor oral intake -she received entle hydration, repeat procalcitonin remain low at 0.1, repeat  inflammatory marks has improved with decreasing ddimer and ferritin,  Wbc remain elevated  CT chest "With large left sided pleural effusion with near complete collapse of the left lower lobe. Fluid-filled esophagus to the level of the upper thorax, which places the patient at risk for aspiration. There are a few hyperdense foci within the collapsed left lower lobe which could represent aspirated contrast."  S/p US guided thoracentesis with fluids study on 6/26 pleural fluids gram stain negative, culture no growth, cytology pending Wbc coming down, thrombocytosis resolved after thoracentesis  Esophageal stricture Per Speech there is no significant oropharyngeal phase aspiration, started ppi to decrease reflux,   dg esophagus showed esophageal dysmotility with achalasia, possible malignant stricture at GE junction GI  consulted  Hypokalemia: replace k, improved  Diet controlled dm2 On ssi in the  hospital, stable  Dementia: on namenda and exelon Per POA first cousin Dr Marijo Sanes who is a psychiatrist, patient has an abrupt cognitive declined since 05/2018, she was moved from independent living to snf since 05/2018, patient was evaluated in the ED at the time no reversible causes identified. Abrupt cognitive declined was attributed to progression of dementia -Per PT note, She requires  "frequent verbal and tactile cueing to complete STS safely following cueing for hand placement, Pt able to ambulate short durartion (42ft) from bed to chair.  Short shuffled gait patterns noted.  Upon standing infront of chair, pt increased confusion on next task.  Verbal and tactile cueing for safe mechanics to sit in chair." "Pt was limited by fatigue following transfer to chair, no reports of pain through session" -Per RN, patient needs to be fed, needs cueing to swallow, though does not appear to have frank aspiration episode -she ear about 25% of each meal per calorie count,  I have a Goals of care discussion with POA first cousin Dr Marijo Sanes  Who states patient is DNR, does not want  Feeding tube, she would like to proceed with EGD with dilation to see if this can help her eat better, she would also like to try remeron to help her appetite, I have discussed with her about palliative care to follow patient at snf, she states will reach out to snf MD to arrange palliative care.     Code Status: DNR, no feeding tube  Family Communication: patient   Disposition Plan: not ready to discharge, needs GI work up  snf with palliative care once medically stable with GI clearance   Consultants:  Speech therapist  Social worker  Dietician   GI  Procedures:  MBS  Antibiotics: vanc/rocephin/zithromax x1 then zosyn to cover possible aspiration pneumonia, abx d/ced on 6/24   Objective: BP (!) 145/57 (BP Location: Left Arm)    Pulse 73    Temp 97.8 F (36.6 C) (Oral)    Resp 16     Ht 5' (1.524 m)    Wt 60 kg    SpO2 96%    BMI 25.83 kg/m   Intake/Output Summary (Last 24 hours) at 09/26/2018 1104 Last data filed at 09/26/2018 0951 Gross per 24 hour  Intake 430 ml  Output 825 ml  Net -395 ml   Filed Weights   09/18/18 0120 09/25/18 1640  Weight: 60 kg 60 kg    Exam: Patient is examined daily including today on 09/26/2018, exams remain the same as of yesterday except that has changed    General:  Frail, calm, NAD, oriented to self  Cardiovascular: RRR  Respiratory: diminished at basis, no wheezing, no rhonchi, no rales  Abdomen: Soft/ND/NT, positive BS  Musculoskeletal: No Edema  Neuro: alert, oriented to self only, calm and cooperative  Data Reviewed: Basic Metabolic Panel: Recent Labs  Lab 09/22/18 0509 09/23/18 0336 09/24/18 0626 09/25/18 0340 09/26/18 0341  NA 136 136 138 139 140  K 3.8 3.4* 3.0* 3.8 3.9  CL 104 102 107 107 105  CO2 24 22 21* 24 23  GLUCOSE 104* 112* 130* 140* 128*  BUN <5* 5* 5* 6* 9  CREATININE 0.41* 0.45 0.40* 0.36* 0.47  CALCIUM 7.8* 8.0* 7.9* 8.8* 8.8*  MG  --   --  1.8 1.9  --    Liver Function Tests: Recent Labs  Lab 09/24/18 [phone]  AST 25  ALT 20  ALKPHOS 86  BILITOT 0.2*  PROT 6.5  ALBUMIN 2.3*   No results for input(s): LIPASE, AMYLASE in the last 168 hours. No results for input(s): AMMONIA in the last 168 hours. CBC: Recent Labs  Lab 09/22/18 0509 09/23/18 0336 09/24/18 0626 09/25/18 0340 09/26/18 0341  WBC 20.8* 21.6* 19.1* 17.3* 15.2*  NEUTROABS  --   --  13.0* 11.7* 9.8*  HGB 9.1* 9.7* 9.8* 10.0* 10.0*  HCT 30.0* 30.6* 31.9* 32.9* 32.4*  MCV 90.1 88.7 91.1 89.6 91.3  PLT 468* 428* 384 407* 393   Cardiac Enzymes:   No results for input(s): CKTOTAL, CKMB, CKMBINDEX, TROPONINI in the last 168 hours. BNP (last 3 results) No results for input(s): BNP in the last 8760 hours.  ProBNP (last 3 results) No results for input(s): PROBNP in the last 8760 hours.  CBG: Recent Labs  Lab  09/25/18 0720 09/25/18 1114 09/25/18 1757 09/25/18 2156 09/26/18 0749  GLUCAP 125* 144* 99 107* 116*    Recent Results (from the past 240 hour(s))  Blood Culture (routine x 2)     Status: None   Collection Time: 09/17/18  7:34 PM   Specimen: BLOOD LEFT WRIST  Result Value Ref Range Status   Specimen Description   Final    BLOOD LEFT WRIST Performed at [facility] [address] [place], [place] [address]    Special Requests   Final    BOTTLES DRAWN AEROBIC AND ANAEROBIC Blood Culture results may not be optimal due to an inadequate volume of blood received in culture bottles Performed at [facility] [address], [place], [place] [address]    Culture   Final    NO GROWTH 5 DAYS Performed at [facility], [place] [address], [place], [place] [address]    Report Status 09/22/2018 FINAL  Final  SARS Coronavirus 2 (CEPHEID- Performed in [facility] hospital lab), Hosp Order     Status: None   Collection Time: 09/17/18  7:35 PM   Specimen: Nasopharyngeal Swab  Result Value Ref Range Status   SARS Coronavirus 2 NEGATIVE NEGATIVE Final    Comment: (NOTE) If result is NEGATIVE SARS-CoV-2 target nucleic acids are NOT DETECTED. The SARS-CoV-2 RNA is generally detectable in upper and lower  respiratory specimens during the acute phase of infection. The lowest  concentration of SARS-CoV-2 viral copies this assay can detect is 250  copies / mL. A negative result does not preclude SARS-CoV-2 infection  and should not be used as the sole basis for treatment or other  patient management decisions.  A negative result may occur with  improper specimen collection / handling, submission of specimen other  than nasopharyngeal swab, presence of viral mutation(s) within the  areas targeted by this assay, and inadequate number of viral copies  (<250 copies / mL). A negative result must be combined with clinical  observations, patient history, and epidemiological  information. If result is POSITIVE SARS-CoV-2 target nucleic acids are DETECTED. The SARS-CoV-2 RNA is generally detectable in upper and lower  respiratory specimens dur ing the acute phase of infection.  Positive  results are indicative of active infection with SARS-CoV-2.  Clinical  correlation with patient history and other diagnostic information is  necessary to determine patient infection status.  Positive results do  not rule out bacterial infection or co-infection with other viruses. If result is PRESUMPTIVE POSTIVE SARS-CoV-2 nucleic acids MAY BE PRESENT.   A presumptive positive  result was obtained on the submitted specimen  and confirmed on repeat testing.  While 2019 novel coronavirus  (SARS-CoV-2) nucleic acids may be present in the submitted sample  additional confirmatory testing may be necessary for epidemiological  and / or clinical management purposes  to differentiate between  SARS-CoV-2 and other Sarbecovirus currently known to infect humans.  If clinically indicated additional testing with an alternate test  methodology [phone]) is advised. The SARS-CoV-2 RNA is generally  detectable in upper and lower respiratory sp ecimens during the acute  phase of infection. The expected result is Negative. Fact Sheet for Patients:  StrictlyIdeas.no Fact Sheet for Healthcare Providers: BankingDealers.co.za This test is not yet approved or cleared by the [place] FDA and has been authorized for detection and/or diagnosis of SARS-CoV-2 by FDA under an Emergency Use Authorization (EUA).  This EUA will remain in effect (meaning this test can be used) for the duration of the COVID-19 declaration under Section 564(b)(1) of the Act, 21 U.S.C. section 360bbb-3(b)(1), unless the authorization is terminated or revoked sooner. Performed at [facility], [place] [address] [place], [place] [address]   Blood Culture  (routine x 2)     Status: None   Collection Time: 09/17/18  7:39 PM   Specimen: BLOOD RIGHT FOREARM  Result Value Ref Range Status   Specimen Description   Final    BLOOD RIGHT FOREARM Performed at [facility], [place] [address] [place], [place] [address]    Special Requests   Final    Blood Culture results may not be optimal due to an inadequate volume of blood received in culture bottles BOTTLES DRAWN AEROBIC AND ANAEROBIC Performed at [facility], [place] [address] [place], [place] [address]    Culture   Final    NO GROWTH 5 DAYS Performed at [facility], [place] [address], [place], [place] [address]    Report Status 09/22/2018 FINAL  Final  MRSA PCR Screening     Status: None   Collection Time: 09/18/18 10:45 AM   Specimen: Nasopharyngeal  Result Value Ref Range Status   MRSA by PCR NEGATIVE NEGATIVE Final    Comment:        The GeneXpert MRSA Assay (FDA approved for NASAL specimens only), is one component of a comprehensive MRSA colonization surveillance program. It is not intended to diagnose MRSA infection nor to guide or monitor treatment for MRSA infections. Performed at [facility], [place] [address] [place],  [address]   SARS Coronavirus 2 (CEPHEID - Performed in [facility] hospital lab), Hosp Order     Status: None   Collection Time: 09/18/18  1:01 PM   Specimen: Nasopharyngeal Swab  Result Value Ref Range Status   SARS Coronavirus 2 NEGATIVE NEGATIVE Final    Comment: (NOTE) If result is NEGATIVE SARS-CoV-2 target nucleic acids are NOT DETECTED. The SARS-CoV-2 RNA is generally detectable in upper and lower  respiratory specimens during the acute phase of infection. The lowest  concentration of SARS-CoV-2 viral copies this assay can detect is 250  copies / mL. A negative result does not preclude SARS-CoV-2 infection  and should not be used as the sole basis for treatment or other  patient management  decisions.  A negative result may occur with  improper specimen collection / handling, submission of specimen other  than nasopharyngeal swab, presence of viral mutation(s) within the  areas targeted by this assay, and inadequate number of viral copies  (<  250 copies / mL). A negative result must be combined with clinical  observations, patient history, and epidemiological information. If result is POSITIVE SARS-CoV-2 target nucleic acids are DETECTED. The SARS-CoV-2 RNA is generally detectable in upper and lower  respiratory specimens dur ing the acute phase of infection.  Positive  results are indicative of active infection with SARS-CoV-2.  Clinical  correlation with patient history and other diagnostic information is  necessary to determine patient infection status.  Positive results do  not rule out bacterial infection or co-infection with other viruses. If result is PRESUMPTIVE POSTIVE SARS-CoV-2 nucleic acids MAY BE PRESENT.   A presumptive positive result was obtained on the submitted specimen  and confirmed on repeat testing.  While 2019 novel coronavirus  (SARS-CoV-2) nucleic acids may be present in the submitted sample  additional confirmatory testing may be necessary for epidemiological  and / or clinical management purposes  to differentiate between  SARS-CoV-2 and other Sarbecovirus currently known to infect humans.  If clinically indicated additional testing with an alternate test  methodology [phone]) is advised. The SARS-CoV-2 RNA is generally  detectable in upper and lower respiratory sp ecimens during the acute  phase of infection. The expected result is Negative. Fact Sheet for Patients:  StrictlyIdeas.no Fact Sheet for Healthcare Providers: BankingDealers.co.za This test is not yet approved or cleared by the [place] FDA and has been authorized for detection and/or diagnosis of SARS-CoV-2 by FDA under an  Emergency Use Authorization (EUA).  This EUA will remain in effect (meaning this test can be used) for the duration of the COVID-19 declaration under Section 564(b)(1) of the Act, 21 U.S.C. section 360bbb-3(b)(1), unless the authorization is terminated or revoked sooner. Performed at [facility], [place] [address] [place], [place] [address]   Urine culture     Status: None   Collection Time: 09/19/18  6:07 AM   Specimen: Urine, Clean Catch  Result Value Ref Range Status   Specimen Description   Final    URINE, CLEAN CATCH Performed at [facility], [place] [address], [place], [place] [address]    Special Requests   Final    NONE Performed at [facility], [place] [address] [place], [place] [address]    Culture   Final    NO GROWTH Performed at [facility], [place] [address] [place], [place] [address]    Report Status 09/20/2018 FINAL  Final  Novel Coronavirus,NAA,(SEND-OUT TO REF LAB - TAT 24-48 hrs); Hosp Order     Status: None   Collection Time: 09/23/18  8:45 PM   Specimen: Oropharyngeal swab; Respiratory  Result Value Ref Range Status   SARS-CoV-2, NAA NOT DETECTED NOT DETECTED Final    Comment: (NOTE) This test was developed and its performance characteristics determined by [employer]. This test has not been FDA cleared or approved. This test has been authorized by FDA under an Emergency Use Authorization (EUA). This test is only authorized for the duration of time the declaration that circumstances exist justifying the authorization of the emergency use of in vitro diagnostic tests for detection of SARS-CoV-2 virus and/or diagnosis of COVID-19 infection under section 564(b)(1) of the Act, 21 U.S.C. 326ZTI-4(P)(8), unless the authorization is terminated or revoked sooner. When diagnostic testing is negative, the possibility of a false negative result should be considered in the context of a patient's  recent exposures and the presence of clinical signs and symptoms consistent with COVID-19. An individual without symptoms of  COVID-19 and who is not shedding SARS-CoV-2 virus would expect to have a negative (not detected) result in this assay. Performed  At: [facility], [place] [id number] Rush Farmer MD DG:[id number]    [place]  Final    Comment: Performed at [facility] [address], [place], [place] [address]  Culture, body fluid-bottle     Status: None (Preliminary result)   Collection Time: 09/25/18  4:53 PM   Specimen: Pleura  Result Value Ref Range Status   Specimen Description PLEURAL  Final   Special Requests NONE  Final   Culture   Final    NO GROWTH < 12 HOURS Performed at [facility], [place] [address], [place], [place] [address]    Report Status PENDING  Incomplete  Gram stain     Status: None   Collection Time: 09/25/18  4:53 PM   Specimen: Pleura  Result Value Ref Range Status   Specimen Description PLEURAL  Final   Special Requests NONE  Final   Gram Stain   Final    NO WBC SEEN NO ORGANISMS SEEN Performed at [facility], [address], [place], [place] [address]    Report Status 09/26/2018 FINAL  Final     Studies: Dg Chest 1 View  Result Date: 09/25/2018 CLINICAL DATA:  Left pleural effusion. Status post left thoracentesis. EXAM: CHEST  1 VIEW COMPARISON:  Chest x-ray and CT scan dated 09/24/2018 FINDINGS: The moderate left effusion is diminished after thoracentesis. No pneumothorax. Slightly improved aeration at the left lung base. Right lung is clear. Heart size and vascularity are normal. No acute bone abnormality. Chronic rotator cuff disease at the right shoulder. IMPRESSION: 1. No pneumothorax after left thoracentesis. 2. Diminished left effusion with slightly improved aeration at the left lung base. Electronically Signed   By: Lorriane Shire M.D.   On:  09/25/2018 17:02   Dg Esophagus W Single Cm (sol Or Thin Ba)  Result Date: 09/25/2018 CLINICAL DATA:  Dysphagia EXAM: ESOPHOGRAM/BARIUM SWALLOW TECHNIQUE: Single contrast examination was performed using  thin barium. FLUOROSCOPY TIME:  Fluoroscopy Time:  1 minutes 30 seconds Radiation Exposure Index (if provided by the fluoroscopic device): 33.6 mGy Number of Acquired Spot Images: 7 COMPARISON:  None. FINDINGS: Limited single contrast evaluation in the prone position. Mild esophageal dysmotility. No fixed esophageal narrowing or stricture involving the upper/mid esophagus. However, at a minimum, there is lower esophageal dysfunction. On initial swallows, contrast passed into the stomach, which is notable for a small hiatal hernia. However, on subsequent swallows, further contrast did not pass into the stomach in remained in the mid/distal esophagus, despite a 5 minutes delay. The possibility of a malignant stricture at the GE junction is not excluded. IMPRESSION: Limited single contrast evaluation in the prone position. Overall appearance favors esophageal dysmotility with achalasia. However, a malignant stricture at the GE junction is not excluded. Endoscopic correlation is suggested. Electronically Signed   By: Julian Hy M.D.   On: 09/25/2018 17:34   US Thoracentesis Asp Pleural Space W/img Guide  Result Date: 09/25/2018 INDICATION: Shortness of breath with left pleural effusion. Request for diagnostic and therapeutic thoracentesis. EXAM: ULTRASOUND GUIDED LEFT THORACENTESIS MEDICATIONS: 1% lidocaine 10 mL COMPLICATIONS: None immediate. PROCEDURE: An ultrasound guided thoracentesis was thoroughly discussed with the patient and questions answered. The benefits, risks, alternatives and complications were also discussed. The patient understands and wishes to proceed with the procedure. Written consent was obtained. Ultrasound  was performed to localize and mark an adequate pocket of fluid in the left  chest. The area was then prepped and draped in the normal sterile fashion. 1% Lidocaine was used for local anesthesia. Under ultrasound guidance a 6 Fr Safe-T-Centesis catheter was introduced. Thoracentesis was performed. The catheter was removed and a dressing applied. FINDINGS: A total of approximately 200 mL of clear yellow fluid was removed. Samples were sent to the laboratory as requested by the clinical team. IMPRESSION: Successful ultrasound guided left thoracentesis yielding 200 mL of pleural fluid. No pneumothorax on post-procedure chest x-ray. Read by: Gareth Eagle PA-C Electronically Signed   By: Jerilynn Mages.  Shick M.D.   On: 09/25/2018 17:02    Scheduled Meds:  aspirin EC  81 mg Oral Daily   calcium-vitamin D  1 tablet Oral Q breakfast   feeding supplement (ENSURE ENLIVE)  237 mL Oral BID BM   insulin aspart  0-5 Units Subcutaneous QHS   insulin aspart  0-9 Units Subcutaneous TID WC   loratadine  10 mg Oral Daily   memantine  28 mg Oral Daily   multivitamin with minerals  1 tablet Oral Daily   omega-3 acid ethyl esters  1 g Oral Daily   pantoprazole  20 mg Oral Daily   rivastigmine  9.5 mg Transdermal Daily    Continuous Infusions:  sodium chloride       Time spent: 74mins I have personally reviewed and interpreted on  09/26/2018 daily labs,  imagings as discussed above under date review session and assessment and plans.  I reviewed all nursing notes, pharmacy notes, consultant notes,  vitals, pertinent old records  I have discussed plan of care as described above with RN , patient  on 09/26/2018   Florencia Reasons MD, PhD  [facility] Pager [phone]. If 7PM-7AM, please contact night-coverage at [email or web address], password [facility] 09/26/2018, 11:04 AM  LOS: 9 days

## 2018-09-26 NOTE — Progress Notes (Signed)
Patient ID: Grace Gilbert, female   DOB: 1933-02-16, 85 y.o.   MRN: [id number] Brief GI update. I called POA Marijo Sanes, I re discussed the benefits and risks of EGD including risk of aspiration, infection, bleeding, perforation and risks of sedation. [name] provided verbal consent to proceed with EGD with Dr .Lyndel Safe tomorrow. I called 5W RN Marge, to call POA with witness to complete consent form. NPO after midnight. IV access currently functioning.

## 2018-09-26 NOTE — H&P (View-Only) (Signed)
Referring Provider: Dr. Florencia Reasons Primary Care Physician:  [name], MD Primary Gastroenterologist:  Althia Forts   Reason for Consultation:  Abnormal chest CT and barium swallow showed fluid filled esophagus, possible stricture at the  GE junction  HPI: Grace Gilbert is a 85 y.o. female with a past medical history of Alzheimer's, dementia, asthma, diabetes mellitus type 2, esophageal stricture, GERD, hiatal hernia, CVA 2016. Past cataract surgery, abdominal hysterectomy, thoracotomy secondary to pleural empyema and tonsillectomy.  She resides at [facility] and presented to the emergency room 09/17/2018 with a fever and cough.  Has significant leukocytosis of 24.  Test x-ray confirmed pneumonia.  COVID test from the nursing home facility was ported as negative.  A repeat COVID test was negative. She received Rocephin and Azithromycin. A chest CT 6/25 identified a large left sided pleural effusion near complete collapse of the left lower lobe and fluid-filled esophagus to the level of the upper thorax.  A thoracentesis was completed 26/2020, 200 mL of pleural fluid was removed, laboratory studies pending. A barium swallow 09/25/2018 identified esophageal dysmotility with achalasia, however, a malignant stricture at the GE junction could not be excluded.  EGD advised for further evaluation. I called the patient's POA, Marijo Sanes, she lives in [place]. She verified patient has a prior history of dysphagia but could not provide information regarding past EGD's. She has not seen patient since March, she is not aware or any worsening dysphagia symptoms.  Laboratory results 09/26/2018: Sodium 140.  Potassium 3.9.  Glucose 128.  BUN 9.  Creatinine 0.47.  WBC 15.2.  Hemoglobin 10.0.  Hematocrit 32.4.  MCV 91.3.  Platelet 393.  Temp: 97.6. HR 73. BP 145/57.   Past Medical History:  Diagnosis Date   Abnormal CT scan, chest September 2011   Scar tissue   Allergic rhinitis     Alzheimer's dementia ([place])    Asthma    Diabetes mellitus    borderline and under control-diet ,exercise   Esophageal stricture    Family history of colon cancer    Mother   GERD (gastroesophageal reflux disease)    Hiatal hernia    HLD (hyperlipidemia)    Hypertension    Osteopenia    Pneumonia 12-15 yrs.ago   yrs. ago   TIA (transient ischemic attack)    Vitamin D deficiency     Past Surgical History:  Procedure Laterality Date   ABDOMINAL HYSTERECTOMY  1980    CATARACT EXTRACTION Left 2014   SHOULDER OPEN ROTATOR CUFF REPAIR  05/22/2011   Procedure: ROTATOR CUFF REPAIR SHOULDER OPEN;  Surgeon: Tobi Bastos, MD;  Location: [facility];  Service: Orthopedics;  Laterality: Left;   THORACOTOMY / DECORTICATION PARIETAL PLEURA Right 204   empyema   TONSILLECTOMY      Prior to Admission medications   Medication Sig Start Date End Date Taking? Authorizing Provider  acetaminophen (TYLENOL) 325 MG tablet Take 650 mg by mouth every 6 (six) hours as needed for headache.   Yes [provider]  alendronate (FOSAMAX) 70 MG tablet TAKE 1 TAB ONCE A WEEK, AT LEAST 30 MIN BEFORE 1ST FOOD.DO NOT LIE DOWN FOR 30 MIN AFTER TAKING. Patient taking differently: Take 70 mg by mouth once a week. Take 70mg  30 mins before 1st meal of the day. Do not lie down for 30 mins after taking. 02/03/18  Yes Ngetich, Dinah C, NP  aspirin EC 81 MG tablet Take 1 tablet (81 mg total) by mouth daily. 03/05/17  Yes Blanchie Serve, MD  calcium-vitamin D (OSCAL WITH D) 500-200 MG-UNIT tablet Take 1 tablet by mouth daily with breakfast.   Yes [provider]  cetirizine (ZYRTEC) 10 MG tablet Take 10 mg by mouth at bedtime.    Yes [provider]  memantine (NAMENDA XR) 28 MG CP24 24 hr capsule TAKE ONE CAPSULE ONCE A DAY TO PRESERVE MEMORY. Patient taking differently: Take 28 mg by mouth daily.  02/03/18  Yes Ngetich, Dinah C, NP  Omega-3 Fatty Acids (FISH OIL OMEGA-3) 1000 MG CAPS  Take 1 capsule by mouth daily. 01/06/18  Yes Ngetich, Dinah C, NP  rivastigmine (EXELON) 9.5 mg/24hr Apply fresh patch daily and remove old patch to help preserve memory 10/07/17  Yes Blanchie Serve, MD    Current Facility-Administered Medications  Medication Dose Route Frequency Provider Last Rate Last Dose   acetaminophen (TYLENOL) tablet 650 mg  650 mg Oral Q6H PRN Hugelmeyer, Alexis, DO   650 mg at 09/25/18 1845   Or   acetaminophen (TYLENOL) suppository 650 mg  650 mg Rectal Q6H PRN Hugelmeyer, Alexis, DO       aspirin EC tablet 81 mg  81 mg Oral Daily Hugelmeyer, Alexis, DO   81 mg at 09/25/18 1610   benzonatate (TESSALON) capsule 100 mg  100 mg Oral TID PRN Hugelmeyer, Alexis, DO   100 mg at 09/21/18 1558   bisacodyl (DULCOLAX) EC tablet 5 mg  5 mg Oral Daily PRN Hugelmeyer, Alexis, DO       calcium-vitamin D (OSCAL WITH D) 500-200 MG-UNIT per tablet 1 tablet  1 tablet Oral Q breakfast Hugelmeyer, Alexis, DO   1 tablet at 09/25/18 9604   feeding supplement (ENSURE ENLIVE) (ENSURE ENLIVE) liquid 237 mL  237 mL Oral BID BM Florencia Reasons, MD   237 mL at 09/25/18 2226   insulin aspart (novoLOG) injection 0-5 Units  0-5 Units Subcutaneous QHS Hugelmeyer, Alexis, DO       insulin aspart (novoLOG) injection 0-9 Units  0-9 Units Subcutaneous TID WC Hugelmeyer, Alexis, DO   1 Units at 09/25/18 1222   ipratropium-albuterol (DUONEB) 0.5-2.5 (3) MG/3ML nebulizer solution 3 mL  3 mL Nebulization Q6H PRN Hugelmeyer, Alexis, DO       loratadine (CLARITIN) tablet 10 mg  10 mg Oral Daily Hugelmeyer, Alexis, DO   10 mg at 09/25/18 5409   magnesium citrate solution 1 Bottle  1 Bottle Oral Once PRN Hugelmeyer, Alexis, DO       memantine (NAMENDA XR) 24 hr capsule 28 mg  28 mg Oral Daily Hugelmeyer, Alexis, DO   28 mg at 09/25/18 8119   multivitamin with minerals tablet 1 tablet  1 tablet Oral Daily Florencia Reasons, MD   1 tablet at 09/25/18 0851   omega-3 acid ethyl esters (LOVAZA) capsule 1 g  1 g Oral  Daily Hugelmeyer, Alexis, DO   1 g at 09/25/18 0821   ondansetron (ZOFRAN) tablet 4 mg  4 mg Oral Q6H PRN Hugelmeyer, Alexis, DO       Or   ondansetron (ZOFRAN) injection 4 mg  4 mg Intravenous Q6H PRN Hugelmeyer, Alexis, DO       pantoprazole (PROTONIX) EC tablet 20 mg  20 mg Oral Daily Florencia Reasons, MD   20 mg at 09/25/18 1814   rivastigmine (EXELON) 9.5 mg/24hr 9.5 mg  9.5 mg Transdermal Daily Hugelmeyer, Alexis, DO   9.5 mg at 09/25/18 0853   senna-docusate (Senokot-S) tablet 1 tablet  1 tablet Oral QHS PRN  Hugelmeyer, Alexis, DO        Allergies as of 09/17/2018 - Review Complete 09/17/2018  Allergen Reaction Noted   Aricept [donepezil hcl] Diarrhea 03/08/2015   Crestor [rosuvastatin calcium]  03/08/2015   Lipitor [atorvastatin calcium]  08/04/2018   Lovastatin  03/13/2015   Micardis [telmisartan]  03/08/2015   Sertraline Diarrhea 03/08/2015   Welchol [colesevelam hcl]  03/08/2015   Zetia [ezetimibe]  03/08/2015   Zocor [simvastatin]  03/08/2015    History reviewed. No pertinent family history.  Social History   Socioeconomic History   Marital status: Single    Spouse name: Not on file   Number of children: Not on file   Years of education: Not on file   Highest education level: Not on file  Occupational History   Occupation: retired Scientist, research (physical sciences) strain: Not on file   Food insecurity    Worry: Not on file    Inability: Not on file   Transportation needs    Medical: Not on file    Non-medical: Not on file  Tobacco Use   Smoking status: Former Smoker    Packs/day: 1.50    Years: 20.00    Pack years: 30.00    Types: Cigarettes    Quit date: 05/15/1987    Years since quitting: 31.3   Smokeless tobacco: Never Used  Substance and Sexual Activity   Alcohol use: No    Alcohol/week: 1.0 standard drinks    Types: 1 Glasses of wine per week   Drug use: No   Sexual activity: Never  Lifestyle   Physical activity     Days per week: Not on file    Minutes per session: Not on file   Stress: Not on file  Relationships   Social connections    Talks on phone: Not on file    Gets together: Not on file    Attends religious service: Not on file    Active member of club or organization: Not on file    Attends meetings of clubs or organizations: Not on file    Relationship status: Not on file   Intimate partner violence    Fear of current or ex partner: Not on file    Emotionally abused: Not on file    Physically abused: Not on file    Forced sexual activity: Not on file  Other Topics Concern   Not on file  Social History Narrative   Lives at [facility] since 07/27/2014   Previous math Pharmacist, hospital.   Never married   Former smoker stopped 1989   Alcohol occasionally    POA niece Terance Hart [place], [place]    Review of Systems: ROS not obtained as patient has dementia  Physical Exam: Vital signs in last 24 hours: Temp:  [97.6 F (36.4 C)-98.4 F (36.9 C)] 97.8 F (36.6 C) (06/27 0614) Pulse Rate:  [69-80] 73 (06/27 0614) Resp:  [16-18] 16 (06/27 0614) BP: (132-183)/(46-106) 145/57 (06/27 0614) SpO2:  [95 %-98 %] 96 % (06/27 0614) Weight:  [60 kg] 60 kg (06/26 1640) Last BM Date: 09/24/18 General:   Alert to name Head:  Normocephalic and atraumatic. Eyes:  Sclera clear, no icterus. Conjunctiva pink. Ears:  Normal auditory acuity. Nose:  No deformity, discharge or lesions. Mouth:  No deformity or lesions.   Neck:  Supple. Lungs:  Clear throughout right lung fields, crackles LLL, thoracentesis drsg LLL intact. Heart: RRR, no murmurs.  Abdomen:  Soft, nondistended, nontender,  no masses or organomegaly. Positive BX x 4 quads. Rectal:  Deferred  Msk:  Symmetrical without gross deformities. . Pulses:  Normal pulses noted. Extremities:  Without clubbing or edema. Neurologic:  Alert and  oriented x 1, follows simple commands, moves all extremities. Skin:  Intact without significant  lesions or rashes.. Psych:  Alert and cooperative. Normal mood and affect.  Intake/Output from previous day: 06/26 0701 - 06/27 0700 In: 490 [P.O.:490] Out: 825 [Urine:825] Intake/Output this shift: No intake/output data recorded.  Lab Results: Recent Labs    09/24/18 0626 09/25/18 0340 09/26/18 0341  WBC 19.1* 17.3* 15.2*  HGB 9.8* 10.0* 10.0*  HCT 31.9* 32.9* 32.4*  PLT 384 407* 393   BMET Recent Labs    09/24/18 0626 09/25/18 0340 09/26/18 0341  NA 138 139 140  K 3.0* 3.8 3.9  CL 107 107 105  CO2 21* 24 23  GLUCOSE 130* 140* 128*  BUN 5* 6* 9  CREATININE 0.40* 0.36* 0.47  CALCIUM 7.9* 8.8* 8.8*   LFT Recent Labs    09/24/18 0626  PROT 6.5  ALBUMIN 2.3*  AST 25  ALT 20  ALKPHOS 86  BILITOT 0.2*  BILIDIR <0.1  IBILI NOT CALCULATED     Studies/Results: Dg Chest 1 View  Result Date: 09/25/2018 CLINICAL DATA:  Left pleural effusion. Status post left thoracentesis. EXAM: CHEST  1 VIEW COMPARISON:  Chest x-ray and CT scan dated 09/24/2018 FINDINGS: The moderate left effusion is diminished after thoracentesis. No pneumothorax. Slightly improved aeration at the left lung base. Right lung is clear. Heart size and vascularity are normal. No acute bone abnormality. Chronic rotator cuff disease at the right shoulder. IMPRESSION: 1. No pneumothorax after left thoracentesis. 2. Diminished left effusion with slightly improved aeration at the left lung base. Electronically Signed   By: Lorriane Shire M.D.   On: 09/25/2018 17:02   Dg Abd 1 View  Result Date: 09/24/2018 CLINICAL DATA:  Fever cough and short of breath EXAM: ABDOMEN - 1 VIEW COMPARISON:  None. FINDINGS: Nonobstructed bowel gas pattern with contrast material in the colon and rectum. Airspace disease at the left lung base with suspected effusion. Catheter inferior to the pubic bones. IMPRESSION: Nonobstructed bowel-gas pattern with contrast material in the colon and rectum. Electronically Signed   By: Donavan Foil M.D.   On: 09/24/2018 17:16   Ct Chest Wo Contrast  Result Date: 09/24/2018 CLINICAL DATA:  Pleural effusion. EXAM: CT CHEST WITHOUT CONTRAST TECHNIQUE: Multidetector CT imaging of the chest was performed following the standard protocol without IV contrast. COMPARISON:  None. FINDINGS: Cardiovascular: No significant vascular findings. Normal heart size. No pericardial effusion. Coronary artery calcifications are noted. Aortic calcifications are noted. Mediastinum/Nodes: No enlarged mediastinal or axillary lymph nodes. The thyroid gland is unremarkable. The esophagus is fluid-filled to the level of the upper thorax. Lungs/Pleura: There is a large left-sided pleural effusion. There is near complete collapse of the left lower lobe. Multiple calcifications are noted within the collapsed left lower lobe. There is a trace right-sided pleural effusion. The trachea is unremarkable. Upper Abdomen: No acute abnormality. Musculoskeletal: No chest wall mass or suspicious bone lesions identified. IMPRESSION: 1. Evaluation is limited by motion artifact and lack of IV contrast. 2. Large left-sided pleural effusion with near complete collapse of the left lower lobe. 3. Trace right-sided pleural effusion. 4. Fluid-filled esophagus to the level of the upper thorax, which places the patient at risk for aspiration. There are a few hyperdense foci within the  collapsed left lower lobe which could represent aspirated contrast. Aortic Atherosclerosis (ICD10-I70.0). Electronically Signed   By: Constance Holster M.D.   On: 09/24/2018 19:10   Dg Chest Port 1 View  Result Date: 09/24/2018 CLINICAL DATA:  Fever cough and short of breath EXAM: PORTABLE CHEST 1 VIEW COMPARISON:  09/17/2018, 06/17/2018, 03/29/2013 FINDINGS: Moderate left-sided pleural effusion. Dense airspace disease at the lingula and left base. Pleural and parenchymal scarring at the right base. Cardiomegaly. Aortic atherosclerosis. No pneumothorax. IMPRESSION:  Moderate left pleural effusion with dense consolidation at the lingula and left base, atelectasis or pneumonia. Findings do not appear significantly changed as compared with 09/17/2018 Electronically Signed   By: Donavan Foil M.D.   On: 09/24/2018 17:18   Dg Esophagus W Single Cm (sol Or Thin Ba)  Result Date: 09/25/2018 CLINICAL DATA:  Dysphagia EXAM: ESOPHOGRAM/BARIUM SWALLOW TECHNIQUE: Single contrast examination was performed using  thin barium. FLUOROSCOPY TIME:  Fluoroscopy Time:  1 minutes 30 seconds Radiation Exposure Index (if provided by the fluoroscopic device): 33.6 mGy Number of Acquired Spot Images: 7 COMPARISON:  None. FINDINGS: Limited single contrast evaluation in the prone position. Mild esophageal dysmotility. No fixed esophageal narrowing or stricture involving the upper/mid esophagus. However, at a minimum, there is lower esophageal dysfunction. On initial swallows, contrast passed into the stomach, which is notable for a small hiatal hernia. However, on subsequent swallows, further contrast did not pass into the stomach in remained in the mid/distal esophagus, despite a 5 minutes delay. The possibility of a malignant stricture at the GE junction is not excluded. IMPRESSION: Limited single contrast evaluation in the prone position. Overall appearance favors esophageal dysmotility with achalasia. However, a malignant stricture at the GE junction is not excluded. Endoscopic correlation is suggested. Electronically Signed   By: Julian Hy M.D.   On: 09/25/2018 17:34   US Thoracentesis Asp Pleural Space W/img Guide  Result Date: 09/25/2018 INDICATION: Shortness of breath with left pleural effusion. Request for diagnostic and therapeutic thoracentesis. EXAM: ULTRASOUND GUIDED LEFT THORACENTESIS MEDICATIONS: 1% lidocaine 10 mL COMPLICATIONS: None immediate. PROCEDURE: An ultrasound guided thoracentesis was thoroughly discussed with the patient and questions answered. The benefits,  risks, alternatives and complications were also discussed. The patient understands and wishes to proceed with the procedure. Written consent was obtained. Ultrasound was performed to localize and mark an adequate pocket of fluid in the left chest. The area was then prepped and draped in the normal sterile fashion. 1% Lidocaine was used for local anesthesia. Under ultrasound guidance a 6 Fr Safe-T-Centesis catheter was introduced. Thoracentesis was performed. The catheter was removed and a dressing applied. FINDINGS: A total of approximately 200 mL of clear yellow fluid was removed. Samples were sent to the laboratory as requested by the clinical team. IMPRESSION: Successful ultrasound guided left thoracentesis yielding 200 mL of pleural fluid. No pneumothorax on post-procedure chest x-ray. Read by: Gareth Eagle PA-C Electronically Signed   By: Jerilynn Mages.  Shick M.D.   On: 09/25/2018 17:02    IMPRESSION/PLAN:  1. 85 y.o. female with LLL pneumonia, s/p thoracentesis 6/26. Chest CT showed fluid filled esophagus to the level of the upper thorax. Barium swallow 6/26 showed possible esophageal dysmotility with achalasia, malignant stricture at the GE junction could not be excluded. It is unclear if patient has dysphagia. Currently NPO. No family present. -NPO -IVF per hospitalist -will need EGD, most likely tomorrow as patient does not have adequate IV access at this time, may require PICC line -I spoke to the patient's  POA (1st cousin) Marijo Sanes [phone].  I reviewed the benefits and risks of the EGD to including risk with, risk of infection, bleeding and perforation. Benjamine Mola was not prepared to provide consent at this time, she would like some time to make a decision.   2. Leukocytosis secondary to pneumonia, thoracentesis results pending  3. Dementia  4. Normocytic anemia. No signs of active GI bleeding   Noralyn Pick  09/26/2018, 8:49 AM     Attending physician's note   I  have reviewed the chart and examined the patient.  Patient with advanced dementia hence no history can be obtained. I agree with the Advanced Practitioner's note, impression and recommendations.   85 year old with advanced dementia adm with LLL pneumonia/respiratory failure/pleural effusion s/p thoracocentesis 6/26.  Doing better from pulmonary standpoint. Chest CT showed fluid-filled esophagus.  Barium swallow 6/26 esophageal dysmotility with ?Achalasia/ r/o malignant stricture at the GE junction.  Plan: -EGD with esophageal dilatation if OK with POA.  Will await her decision. It certainly is a hard decision since patient has advanced dementia. -IV Protonix for now. -Aspiration precautions.    Carmell Austria, MD [facility] [phone].

## 2018-09-26 NOTE — Consult Note (Addendum)
Referring Provider: Dr. Florencia Reasons Primary Care Physician:  [name], MD Primary Gastroenterologist:  Althia Forts   Reason for Consultation:  Abnormal chest CT and barium swallow showed fluid filled esophagus, possible stricture at the  GE junction  HPI: Grace Gilbert is a 85 y.o. female with a past medical history of Alzheimer's, dementia, asthma, diabetes mellitus type 2, esophageal stricture, GERD, hiatal hernia, CVA 2016. Past cataract surgery, abdominal hysterectomy, thoracotomy secondary to pleural empyema and tonsillectomy.  She resides at [facility] and presented to the emergency room 09/17/2018 with a fever and cough.  Has significant leukocytosis of 24.  Test x-ray confirmed pneumonia.  COVID test from the nursing home facility was ported as negative.  A repeat COVID test was negative. She received Rocephin and Azithromycin. A chest CT 6/25 identified a large left sided pleural effusion near complete collapse of the left lower lobe and fluid-filled esophagus to the level of the upper thorax.  A thoracentesis was completed 26/2020, 200 mL of pleural fluid was removed, laboratory studies pending. A barium swallow 09/25/2018 identified esophageal dysmotility with achalasia, however, a malignant stricture at the GE junction could not be excluded.  EGD advised for further evaluation. I called the patient's POA, Marijo Sanes, she lives in [place]. She verified patient has a prior history of dysphagia but could not provide information regarding past EGD's. She has not seen patient since March, she is not aware or any worsening dysphagia symptoms.  Laboratory results 09/26/2018: Sodium 140.  Potassium 3.9.  Glucose 128.  BUN 9.  Creatinine 0.47.  WBC 15.2.  Hemoglobin 10.0.  Hematocrit 32.4.  MCV 91.3.  Platelet 393.  Temp: 97.6. HR 73. BP 145/57.   Past Medical History:  Diagnosis Date   Abnormal CT scan, chest September 2011   Scar tissue   Allergic rhinitis     Alzheimer's dementia ([place])    Asthma    Diabetes mellitus    borderline and under control-diet ,exercise   Esophageal stricture    Family history of colon cancer    Mother   GERD (gastroesophageal reflux disease)    Hiatal hernia    HLD (hyperlipidemia)    Hypertension    Osteopenia    Pneumonia 12-15 yrs.ago   yrs. ago   TIA (transient ischemic attack)    Vitamin D deficiency     Past Surgical History:  Procedure Laterality Date   ABDOMINAL HYSTERECTOMY  1980    CATARACT EXTRACTION Left 2014   SHOULDER OPEN ROTATOR CUFF REPAIR  05/22/2011   Procedure: ROTATOR CUFF REPAIR SHOULDER OPEN;  Surgeon: Tobi Bastos, MD;  Location: [facility];  Service: Orthopedics;  Laterality: Left;   THORACOTOMY / DECORTICATION PARIETAL PLEURA Right 204   empyema   TONSILLECTOMY      Prior to Admission medications   Medication Sig Start Date End Date Taking? Authorizing Provider  acetaminophen (TYLENOL) 325 MG tablet Take 650 mg by mouth every 6 (six) hours as needed for headache.   Yes [provider]  alendronate (FOSAMAX) 70 MG tablet TAKE 1 TAB ONCE A WEEK, AT LEAST 30 MIN BEFORE 1ST FOOD.DO NOT LIE DOWN FOR 30 MIN AFTER TAKING. Patient taking differently: Take 70 mg by mouth once a week. Take 70mg  30 mins before 1st meal of the day. Do not lie down for 30 mins after taking. 02/03/18  Yes Ngetich, Dinah C, NP  aspirin EC 81 MG tablet Take 1 tablet (81 mg total) by mouth daily. 03/05/17  Yes Blanchie Serve, MD  calcium-vitamin D (OSCAL WITH D) 500-200 MG-UNIT tablet Take 1 tablet by mouth daily with breakfast.   Yes [provider]  cetirizine (ZYRTEC) 10 MG tablet Take 10 mg by mouth at bedtime.    Yes [provider]  memantine (NAMENDA XR) 28 MG CP24 24 hr capsule TAKE ONE CAPSULE ONCE A DAY TO PRESERVE MEMORY. Patient taking differently: Take 28 mg by mouth daily.  02/03/18  Yes Ngetich, Dinah C, NP  Omega-3 Fatty Acids (FISH OIL OMEGA-3) 1000 MG CAPS  Take 1 capsule by mouth daily. 01/06/18  Yes Ngetich, Dinah C, NP  rivastigmine (EXELON) 9.5 mg/24hr Apply fresh patch daily and remove old patch to help preserve memory 10/07/17  Yes Blanchie Serve, MD    Current Facility-Administered Medications  Medication Dose Route Frequency Provider Last Rate Last Dose   acetaminophen (TYLENOL) tablet 650 mg  650 mg Oral Q6H PRN Hugelmeyer, Alexis, DO   650 mg at 09/25/18 1845   Or   acetaminophen (TYLENOL) suppository 650 mg  650 mg Rectal Q6H PRN Hugelmeyer, Alexis, DO       aspirin EC tablet 81 mg  81 mg Oral Daily Hugelmeyer, Alexis, DO   81 mg at 09/25/18 3267   benzonatate (TESSALON) capsule 100 mg  100 mg Oral TID PRN Hugelmeyer, Alexis, DO   100 mg at 09/21/18 1558   bisacodyl (DULCOLAX) EC tablet 5 mg  5 mg Oral Daily PRN Hugelmeyer, Alexis, DO       calcium-vitamin D (OSCAL WITH D) 500-200 MG-UNIT per tablet 1 tablet  1 tablet Oral Q breakfast Hugelmeyer, Alexis, DO   1 tablet at 09/25/18 1245   feeding supplement (ENSURE ENLIVE) (ENSURE ENLIVE) liquid 237 mL  237 mL Oral BID BM Florencia Reasons, MD   237 mL at 09/25/18 2226   insulin aspart (novoLOG) injection 0-5 Units  0-5 Units Subcutaneous QHS Hugelmeyer, Alexis, DO       insulin aspart (novoLOG) injection 0-9 Units  0-9 Units Subcutaneous TID WC Hugelmeyer, Alexis, DO   1 Units at 09/25/18 1222   ipratropium-albuterol (DUONEB) 0.5-2.5 (3) MG/3ML nebulizer solution 3 mL  3 mL Nebulization Q6H PRN Hugelmeyer, Alexis, DO       loratadine (CLARITIN) tablet 10 mg  10 mg Oral Daily Hugelmeyer, Alexis, DO   10 mg at 09/25/18 8099   magnesium citrate solution 1 Bottle  1 Bottle Oral Once PRN Hugelmeyer, Alexis, DO       memantine (NAMENDA XR) 24 hr capsule 28 mg  28 mg Oral Daily Hugelmeyer, Alexis, DO   28 mg at 09/25/18 8338   multivitamin with minerals tablet 1 tablet  1 tablet Oral Daily Florencia Reasons, MD   1 tablet at 09/25/18 0851   omega-3 acid ethyl esters (LOVAZA) capsule 1 g  1 g Oral  Daily Hugelmeyer, Alexis, DO   1 g at 09/25/18 0821   ondansetron (ZOFRAN) tablet 4 mg  4 mg Oral Q6H PRN Hugelmeyer, Alexis, DO       Or   ondansetron (ZOFRAN) injection 4 mg  4 mg Intravenous Q6H PRN Hugelmeyer, Alexis, DO       pantoprazole (PROTONIX) EC tablet 20 mg  20 mg Oral Daily Florencia Reasons, MD   20 mg at 09/25/18 1814   rivastigmine (EXELON) 9.5 mg/24hr 9.5 mg  9.5 mg Transdermal Daily Hugelmeyer, Alexis, DO   9.5 mg at 09/25/18 0853   senna-docusate (Senokot-S) tablet 1 tablet  1 tablet Oral QHS PRN  Hugelmeyer, Alexis, DO        Allergies as of 09/17/2018 - Review Complete 09/17/2018  Allergen Reaction Noted   Aricept [donepezil hcl] Diarrhea 03/08/2015   Crestor [rosuvastatin calcium]  03/08/2015   Lipitor [atorvastatin calcium]  08/04/2018   Lovastatin  03/13/2015   Micardis [telmisartan]  03/08/2015   Sertraline Diarrhea 03/08/2015   Welchol [colesevelam hcl]  03/08/2015   Zetia [ezetimibe]  03/08/2015   Zocor [simvastatin]  03/08/2015    History reviewed. No pertinent family history.  Social History   Socioeconomic History   Marital status: Single    Spouse name: Not on file   Number of children: Not on file   Years of education: Not on file   Highest education level: Not on file  Occupational History   Occupation: retired Scientist, research (physical sciences) strain: Not on file   Food insecurity    Worry: Not on file    Inability: Not on file   Transportation needs    Medical: Not on file    Non-medical: Not on file  Tobacco Use   Smoking status: Former Smoker    Packs/day: 1.50    Years: 20.00    Pack years: 30.00    Types: Cigarettes    Quit date: 05/15/1987    Years since quitting: 31.3   Smokeless tobacco: Never Used  Substance and Sexual Activity   Alcohol use: No    Alcohol/week: 1.0 standard drinks    Types: 1 Glasses of wine per week   Drug use: No   Sexual activity: Never  Lifestyle   Physical activity     Days per week: Not on file    Minutes per session: Not on file   Stress: Not on file  Relationships   Social connections    Talks on phone: Not on file    Gets together: Not on file    Attends religious service: Not on file    Active member of club or organization: Not on file    Attends meetings of clubs or organizations: Not on file    Relationship status: Not on file   Intimate partner violence    Fear of current or ex partner: Not on file    Emotionally abused: Not on file    Physically abused: Not on file    Forced sexual activity: Not on file  Other Topics Concern   Not on file  Social History Narrative   Lives at [facility] since 07/27/2014   Previous math Pharmacist, hospital.   Never married   Former smoker stopped 1989   Alcohol occasionally    POA niece Terance Hart [place], [place]    Review of Systems: ROS not obtained as patient has dementia  Physical Exam: Vital signs in last 24 hours: Temp:  [97.6 F (36.4 C)-98.4 F (36.9 C)] 97.8 F (36.6 C) (06/27 0614) Pulse Rate:  [69-80] 73 (06/27 0614) Resp:  [16-18] 16 (06/27 0614) BP: (132-183)/(46-106) 145/57 (06/27 0614) SpO2:  [95 %-98 %] 96 % (06/27 0614) Weight:  [60 kg] 60 kg (06/26 1640) Last BM Date: 09/24/18 General:   Alert to name Head:  Normocephalic and atraumatic. Eyes:  Sclera clear, no icterus. Conjunctiva pink. Ears:  Normal auditory acuity. Nose:  No deformity, discharge or lesions. Mouth:  No deformity or lesions.   Neck:  Supple. Lungs:  Clear throughout right lung fields, crackles LLL, thoracentesis drsg LLL intact. Heart: RRR, no murmurs.  Abdomen:  Soft, nondistended, nontender,  no masses or organomegaly. Positive BX x 4 quads. Rectal:  Deferred  Msk:  Symmetrical without gross deformities. . Pulses:  Normal pulses noted. Extremities:  Without clubbing or edema. Neurologic:  Alert and  oriented x 1, follows simple commands, moves all extremities. Skin:  Intact without significant  lesions or rashes.. Psych:  Alert and cooperative. Normal mood and affect.  Intake/Output from previous day: 06/26 0701 - 06/27 0700 In: 490 [P.O.:490] Out: 825 [Urine:825] Intake/Output this shift: No intake/output data recorded.  Lab Results: Recent Labs    09/24/18 0626 09/25/18 0340 09/26/18 0341  WBC 19.1* 17.3* 15.2*  HGB 9.8* 10.0* 10.0*  HCT 31.9* 32.9* 32.4*  PLT 384 407* 393   BMET Recent Labs    09/24/18 0626 09/25/18 0340 09/26/18 0341  NA 138 139 140  K 3.0* 3.8 3.9  CL 107 107 105  CO2 21* 24 23  GLUCOSE 130* 140* 128*  BUN 5* 6* 9  CREATININE 0.40* 0.36* 0.47  CALCIUM 7.9* 8.8* 8.8*   LFT Recent Labs    09/24/18 0626  PROT 6.5  ALBUMIN 2.3*  AST 25  ALT 20  ALKPHOS 86  BILITOT 0.2*  BILIDIR <0.1  IBILI NOT CALCULATED     Studies/Results: Dg Chest 1 View  Result Date: 09/25/2018 CLINICAL DATA:  Left pleural effusion. Status post left thoracentesis. EXAM: CHEST  1 VIEW COMPARISON:  Chest x-ray and CT scan dated 09/24/2018 FINDINGS: The moderate left effusion is diminished after thoracentesis. No pneumothorax. Slightly improved aeration at the left lung base. Right lung is clear. Heart size and vascularity are normal. No acute bone abnormality. Chronic rotator cuff disease at the right shoulder. IMPRESSION: 1. No pneumothorax after left thoracentesis. 2. Diminished left effusion with slightly improved aeration at the left lung base. Electronically Signed   By: Lorriane Shire M.D.   On: 09/25/2018 17:02   Dg Abd 1 View  Result Date: 09/24/2018 CLINICAL DATA:  Fever cough and short of breath EXAM: ABDOMEN - 1 VIEW COMPARISON:  None. FINDINGS: Nonobstructed bowel gas pattern with contrast material in the colon and rectum. Airspace disease at the left lung base with suspected effusion. Catheter inferior to the pubic bones. IMPRESSION: Nonobstructed bowel-gas pattern with contrast material in the colon and rectum. Electronically Signed   By: Donavan Foil M.D.   On: 09/24/2018 17:16   Ct Chest Wo Contrast  Result Date: 09/24/2018 CLINICAL DATA:  Pleural effusion. EXAM: CT CHEST WITHOUT CONTRAST TECHNIQUE: Multidetector CT imaging of the chest was performed following the standard protocol without IV contrast. COMPARISON:  None. FINDINGS: Cardiovascular: No significant vascular findings. Normal heart size. No pericardial effusion. Coronary artery calcifications are noted. Aortic calcifications are noted. Mediastinum/Nodes: No enlarged mediastinal or axillary lymph nodes. The thyroid gland is unremarkable. The esophagus is fluid-filled to the level of the upper thorax. Lungs/Pleura: There is a large left-sided pleural effusion. There is near complete collapse of the left lower lobe. Multiple calcifications are noted within the collapsed left lower lobe. There is a trace right-sided pleural effusion. The trachea is unremarkable. Upper Abdomen: No acute abnormality. Musculoskeletal: No chest wall mass or suspicious bone lesions identified. IMPRESSION: 1. Evaluation is limited by motion artifact and lack of IV contrast. 2. Large left-sided pleural effusion with near complete collapse of the left lower lobe. 3. Trace right-sided pleural effusion. 4. Fluid-filled esophagus to the level of the upper thorax, which places the patient at risk for aspiration. There are a few hyperdense foci within the  collapsed left lower lobe which could represent aspirated contrast. Aortic Atherosclerosis (ICD10-I70.0). Electronically Signed   By: Constance Holster M.D.   On: 09/24/2018 19:10   Dg Chest Port 1 View  Result Date: 09/24/2018 CLINICAL DATA:  Fever cough and short of breath EXAM: PORTABLE CHEST 1 VIEW COMPARISON:  09/17/2018, 06/17/2018, 03/29/2013 FINDINGS: Moderate left-sided pleural effusion. Dense airspace disease at the lingula and left base. Pleural and parenchymal scarring at the right base. Cardiomegaly. Aortic atherosclerosis. No pneumothorax. IMPRESSION:  Moderate left pleural effusion with dense consolidation at the lingula and left base, atelectasis or pneumonia. Findings do not appear significantly changed as compared with 09/17/2018 Electronically Signed   By: Donavan Foil M.D.   On: 09/24/2018 17:18   Dg Esophagus W Single Cm (sol Or Thin Ba)  Result Date: 09/25/2018 CLINICAL DATA:  Dysphagia EXAM: ESOPHOGRAM/BARIUM SWALLOW TECHNIQUE: Single contrast examination was performed using  thin barium. FLUOROSCOPY TIME:  Fluoroscopy Time:  1 minutes 30 seconds Radiation Exposure Index (if provided by the fluoroscopic device): 33.6 mGy Number of Acquired Spot Images: 7 COMPARISON:  None. FINDINGS: Limited single contrast evaluation in the prone position. Mild esophageal dysmotility. No fixed esophageal narrowing or stricture involving the upper/mid esophagus. However, at a minimum, there is lower esophageal dysfunction. On initial swallows, contrast passed into the stomach, which is notable for a small hiatal hernia. However, on subsequent swallows, further contrast did not pass into the stomach in remained in the mid/distal esophagus, despite a 5 minutes delay. The possibility of a malignant stricture at the GE junction is not excluded. IMPRESSION: Limited single contrast evaluation in the prone position. Overall appearance favors esophageal dysmotility with achalasia. However, a malignant stricture at the GE junction is not excluded. Endoscopic correlation is suggested. Electronically Signed   By: Julian Hy M.D.   On: 09/25/2018 17:34   US Thoracentesis Asp Pleural Space W/img Guide  Result Date: 09/25/2018 INDICATION: Shortness of breath with left pleural effusion. Request for diagnostic and therapeutic thoracentesis. EXAM: ULTRASOUND GUIDED LEFT THORACENTESIS MEDICATIONS: 1% lidocaine 10 mL COMPLICATIONS: None immediate. PROCEDURE: An ultrasound guided thoracentesis was thoroughly discussed with the patient and questions answered. The benefits,  risks, alternatives and complications were also discussed. The patient understands and wishes to proceed with the procedure. Written consent was obtained. Ultrasound was performed to localize and mark an adequate pocket of fluid in the left chest. The area was then prepped and draped in the normal sterile fashion. 1% Lidocaine was used for local anesthesia. Under ultrasound guidance a 6 Fr Safe-T-Centesis catheter was introduced. Thoracentesis was performed. The catheter was removed and a dressing applied. FINDINGS: A total of approximately 200 mL of clear yellow fluid was removed. Samples were sent to the laboratory as requested by the clinical team. IMPRESSION: Successful ultrasound guided left thoracentesis yielding 200 mL of pleural fluid. No pneumothorax on post-procedure chest x-ray. Read by: Gareth Eagle PA-C Electronically Signed   By: Jerilynn Mages.  Shick M.D.   On: 09/25/2018 17:02    IMPRESSION/PLAN:  1. 85 y.o. female with LLL pneumonia, s/p thoracentesis 6/26. Chest CT showed fluid filled esophagus to the level of the upper thorax. Barium swallow 6/26 showed possible esophageal dysmotility with achalasia, malignant stricture at the GE junction could not be excluded. It is unclear if patient has dysphagia. Currently NPO. No family present. -NPO -IVF per hospitalist -will need EGD, most likely tomorrow as patient does not have adequate IV access at this time, may require PICC line -I spoke to the patient's  POA (1st cousin) Marijo Sanes [phone].  I reviewed the benefits and risks of the EGD to including risk with, risk of infection, bleeding and perforation. Benjamine Mola was not prepared to provide consent at this time, she would like some time to make a decision.   2. Leukocytosis secondary to pneumonia, thoracentesis results pending  3. Dementia  4. Normocytic anemia. No signs of active GI bleeding   Noralyn Pick  09/26/2018, 8:49 AM     Attending physician's note   I  have reviewed the chart and examined the patient.  Patient with advanced dementia hence no history can be obtained. I agree with the Advanced Practitioner's note, impression and recommendations.   85 year old with advanced dementia adm with LLL pneumonia/respiratory failure/pleural effusion s/p thoracocentesis 6/26.  Doing better from pulmonary standpoint. Chest CT showed fluid-filled esophagus.  Barium swallow 6/26 esophageal dysmotility with ?Achalasia/ r/o malignant stricture at the GE junction.  Plan: -EGD with esophageal dilatation if OK with POA.  Will await her decision. It certainly is a hard decision since patient has advanced dementia. -IV Protonix for now. -Aspiration precautions.    Carmell Austria, MD [facility] [phone].

## 2018-09-27 ENCOUNTER — Inpatient Hospital Stay (HOSPITAL_COMMUNITY): Payer: Medicare Other | Admitting: Anesthesiology

## 2018-09-27 ENCOUNTER — Encounter (HOSPITAL_COMMUNITY)
Admission: EM | Disposition: A | Discharge status: Skilled Nursing Facility | Payer: Self-pay | Source: Home / Self Care | Attending: Internal Medicine

## 2018-09-27 ENCOUNTER — Encounter (HOSPITAL_COMMUNITY): Payer: Self-pay

## 2018-09-27 DIAGNOSIS — R933 Abnormal findings on diagnostic imaging of other parts of digestive tract: Secondary | ICD-10-CM

## 2018-09-27 HISTORY — PX: ESOPHAGOGASTRODUODENOSCOPY (EGD) WITH PROPOFOL: SHX5813

## 2018-09-27 HISTORY — PX: BALLOON DILATION: SHX5330

## 2018-09-27 HISTORY — PX: BIOPSY: SHX5522

## 2018-09-27 LAB — COMPREHENSIVE METABOLIC PANEL
ALT: 16 U/L (ref 0–44)
AST: 20 U/L (ref 15–41)
Albumin: 2.3 g/dL — ABNORMAL LOW (ref 3.5–5.0)
Alkaline Phosphatase: 78 U/L (ref 38–126)
Anion gap: 8 (ref 5–15)
BUN: 5 mg/dL — ABNORMAL LOW (ref 8–23)
CO2: 25 mmol/L (ref 22–32)
Calcium: 8 mg/dL — ABNORMAL LOW (ref 8.9–10.3)
Chloride: 105 mmol/L (ref 98–111)
Creatinine, Ser: 0.38 mg/dL — ABNORMAL LOW (ref 0.44–1.00)
GFR calc Af Amer: >60 mL/min (ref >60)
GFR calc non Af Amer: >60 mL/min (ref >60)
Glucose, Bld: 113 mg/dL — ABNORMAL HIGH (ref 70–99)
Potassium: 3.7 mmol/L (ref 3.5–5.1)
Sodium: 138 mmol/L (ref 135–145)
Total Bilirubin: 0.4 mg/dL (ref 0.3–1.2)
Total Protein: 6.4 g/dL — ABNORMAL LOW (ref 6.5–8.1)

## 2018-09-27 LAB — GLUCOSE, CAPILLARY
Glucose-Capillary: 98 mg/dL (ref 70–99)
Glucose-Capillary: 100 mg/dL — ABNORMAL HIGH (ref 70–99)
Glucose-Capillary: 124 mg/dL — ABNORMAL HIGH (ref 70–99)
Glucose-Capillary: 112 mg/dL — ABNORMAL HIGH (ref 70–99)

## 2018-09-27 LAB — MAGNESIUM: Magnesium: 1.8 mg/dL (ref 1.7–2.4)

## 2018-09-27 SURGERY — ESOPHAGOGASTRODUODENOSCOPY (EGD) WITH PROPOFOL
Anesthesia: Monitor Anesthesia Care

## 2018-09-27 MED ORDER — PROPOFOL 500 MG/50ML IV EMUL
INTRAVENOUS | Status: DC | PRN
  Administered 2018-09-27: 30 mg via INTRAVENOUS

## 2018-09-27 MED ORDER — PROPOFOL 10 MG/ML IV BOLUS
INTRAVENOUS | Status: AC
  Filled 2018-09-27: qty 40

## 2018-09-27 MED ORDER — ONDANSETRON HCL 4 MG/2ML IJ SOLN
INTRAMUSCULAR | Status: DC | PRN
  Administered 2018-09-27: 4 mg via INTRAVENOUS

## 2018-09-27 MED ORDER — SODIUM CHLORIDE 0.9 % IV SOLN: INTRAVENOUS | Status: DC

## 2018-09-27 MED ORDER — LACTATED RINGERS IV SOLN
INTRAVENOUS | Status: DC
  Administered 2018-09-27: 1000 mL via INTRAVENOUS

## 2018-09-27 MED ORDER — PROPOFOL 500 MG/50ML IV EMUL
INTRAVENOUS | Status: DC | PRN
  Administered 2018-09-27: 50 ug/kg/min via INTRAVENOUS

## 2018-09-27 MED ORDER — MAGNESIUM SULFATE 2 GM/50ML IV SOLN
2.0000 g | Freq: Once | INTRAVENOUS | Status: AC
  Administered 2018-09-27: 2 g via INTRAVENOUS
  Filled 2018-09-27: qty 50

## 2018-09-27 SURGICAL SUPPLY — 15 items

## 2018-09-27 NOTE — Transfer of Care (Signed)
Immediate Anesthesia Transfer of Care Note  Patient: HAYVEN CROY  Procedure(s) Performed: ESOPHAGOGASTRODUODENOSCOPY (EGD) WITH PROPOFOL (N/A )  Patient Location: PACU  Anesthesia Type:MAC  Level of Consciousness: awake and alert   Airway & Oxygen Therapy: Patient Spontanous Breathing and Patient connected to nasal cannula oxygen  Post-op Assessment: Report given to RN and Post -op Vital signs reviewed and stable  Post vital signs: Reviewed and stable  Last Vitals:  Vitals Value Taken Time  BP    Temp    Pulse 70 09/27/18 1311  Resp 17 09/27/18 1311  SpO2 100 % 09/27/18 1311  Vitals shown include unvalidated device data.  Last Pain:  Vitals:   09/27/18 1240  TempSrc: Oral  PainSc:          Complications: No apparent anesthesia complications

## 2018-09-27 NOTE — Interval H&P Note (Signed)
History and Physical Interval Note:  09/27/2018 12:32 PM  Grace Gilbert  has presented today for surgery, with the diagnosis of possible distal esophageal stricture, achalasia.  The various methods of treatment have been discussed with the patient and family. After consideration of risks, benefits and other options for treatment, the patient has consented to  Procedure(s): ESOPHAGOGASTRODUODENOSCOPY (EGD) WITH PROPOFOL (N/A) as a surgical intervention.  The patient's history has been reviewed, patient examined, no change in status, stable for surgery.  I have reviewed the patient's chart and labs.  Questions were answered to the patient's satisfaction.     Jackquline Denmark

## 2018-09-27 NOTE — Plan of Care (Signed)
Patient lying in bed this morning; pleasantly confused. No complaints of pain or nausea. Will continue to monitor.

## 2018-09-27 NOTE — Anesthesia Preprocedure Evaluation (Addendum)
Anesthesia Evaluation  Patient identified by MRN, date of birth, ID band Patient awake and Patient confused    Reviewed: Allergy & Precautions, NPO status , Patient's Chart, lab work & pertinent test results  Airway Mallampati: II  TM Distance: >3 FB Neck ROM: Full    Dental no notable dental hx. (+) Teeth Intact   Pulmonary asthma , pneumonia, unresolved, former smoker,  Left pleural effusion S/P Thoracentesis Left basilar/lingular pneumonia   Pulmonary exam normal breath sounds clear to auscultation       Cardiovascular hypertension, Pt. on medications Normal cardiovascular exam Rhythm:Regular Rate:Normal  EKG 09/24/2018 NSR, LAFB, early transition V leads, LVH   Neuro/Psych PSYCHIATRIC DISORDERS Dementia TIA   GI/Hepatic Neg liver ROS, hiatal hernia, GERD  Medicated,Achalasia Distal esophageal stricture   Endo/Other  diabetes, Well Controlled, Type 2Hyperlipidemia  Renal/GU negative Renal ROS  negative genitourinary   Musculoskeletal  (+) Arthritis , Osteoarthritis,  Osteoporosis   Abdominal   Peds  Hematology  (+) anemia , Thrombocytosis   Anesthesia Other Findings   Reproductive/Obstetrics                            Anesthesia Physical Anesthesia Plan  ASA: III  Anesthesia Plan: MAC   Post-op Pain Management:    Induction: Intravenous  PONV Risk Score and Plan: 2 and Propofol infusion, Ondansetron and Treatment may vary due to age or medical condition  Airway Management Planned: Natural Airway and Nasal Cannula  Additional Equipment:   Intra-op Plan:   Post-operative Plan:   Informed Consent: I have reviewed the patients History and Physical, chart, labs and discussed the procedure including the risks, benefits and alternatives for the proposed anesthesia with the patient or authorized representative who has indicated his/her understanding and acceptance.   Patient has  DNR.  Discussed DNR with patient, Suspend DNR and Discussed DNR with power of attorney.   Dental advisory given  Plan Discussed with: CRNA and Surgeon  Anesthesia Plan Comments:         Anesthesia Quick Evaluation

## 2018-09-27 NOTE — Progress Notes (Signed)
PROGRESS NOTE  Grace Gilbert VOH:[id number] DOB: [birth year] DOA: 09/17/2018 PCP: [name], MD    Brief Narrative:  [name] a 85 y.o.femalewith a known history of Alzheimer's dementia, hypertension, hyperlipidemia, GERD, diabetespresents to the emergency department for evaluation of fever, cough, shortness of breath. Patient was sent to the emergency department by her nursing home because she had developed symptoms of fever, shortness of breath and cough. Chest x-ray done today was read as pneumonia. Her white blood cell count was 24,000. Of note she was tested negative for COVID 2 days ago at her facility.  Patient did have a positive COVID contact, her nurse aide tested positive on 6/10.  Due to patient's high risk history, patient had a repeat COVID test which returned negative.   per pcp notes, patient has low grade fever at the facility, she has a temperature of 100.3 on presentation, no fever since in the hospital, acute hypoxia has resolved ( per pcp o2 80% on room air at pcp's office, her o2 is 87% room air initially on presentation to the ED)     HPI/Recap of past 24 hours:  Demented elderly, denies pain, no fever , no hypoxia,  She does not eat much, needs to be fed,   She is calm and cooperative, only oriented to self.  Assessment/Plan: Active Problems:   HCAP (healthcare-associated pneumonia)   Acute hypoxemic respiratory failure (HCC)   Lobar pneumonia (HCC)   FTT (failure to thrive) in adult   Vascular dementia without behavioral disturbance (HCC)   Leukocytosis   Normocytic anemia   Thrombocytosis (HCC)   Pleural effusion on left   Dysphagia  Acute hypoxic respiratory failure/likely  From aspiration pneumonia/with left sided pleural effusion (exudative) -cxr with "Left pleural effusion and basilar consolidation, probably pneumonia." -MBS on 6/22 showed mild aspiration risk -urine culture negative, blood culture negative, mrsa screening  negative, covid testing negative x1 from the facility, negative x3 since admitted to the hospital, (reports exposure on 6/10) -reports low grade fever of 100.3 at her nursing home but no fever in the hospital hypoxia has resolved, procalcitonin has been negative, -she is treated with vanc/rocephin/zithromax x1 then zosyn to cover possible aspiration pneumonia, she finished total of 7 days treatment as of 6/24, observe off abx  -repeat procalcitonin remain low at 0.1, repeat  inflammatory marks has improved with decreasing ddimer and ferritin -however, repeat ct chest with large left sided pleural effusion and fluid-filled esophagus" s/p thoracentesis and dg esophagus on 6/26.  -pleural fluids gram stain negative, culture no growth, cytology pending  Persistent leukocytosis/normocytic anemia/mild thrombocytosis -cbc was normal in 05/2018 -repeat procalcitonin remain low at 0.1, repeat  inflammatory marks has improved with decreasing ddimer and ferritin,  -leukocytosis likely reactive to pleural effusion, wbc coming down after thoracentesis  CT chest "With large left sided pleural effusion with near complete collapse of the left lower lobe. Fluid-filled esophagus to the level of the upper thorax, which places the patient at risk for aspiration. There are a few hyperdense foci within the collapsed left lower lobe which could represent aspirated contrast."  S/p US guided thoracentesis with fluids study on 6/26 pleural fluids gram stain negative, culture no growth, cytology pending Wbc coming down, thrombocytosis resolved after thoracentesis  Esophageal stricture Per Speech there is no significant oropharyngeal phase aspiration by MBS, started ppi to decrease reflux,   dg esophagus showed esophageal dysmotility with  stricture at GE junction GI consulted, s/p EGD with dilation, biopsy pending  Hypokalemia:  replace k, improved  Diet controlled dm2 On ssi in the hospital, stable  Dementia: on  namenda and exelon -Per POA first cousin Dr Marijo Sanes who is a psychiatrist, patient has an abrupt cognitive declined since 05/2018, she was moved from independent living to snf since 05/2018, patient was evaluated in the ED at the time no reversible causes identified. Abrupt cognitive declined was attributed to progression of dementia -Per PT note, She requires  "frequent verbal and tactile cueing to complete STS safely following cueing for hand placement, Pt able to ambulate short durartion (77ft) from bed to chair.  Short shuffled gait patterns noted.  Upon standing infront of chair, pt increased confusion on next task.  Verbal and tactile cueing for safe mechanics to sit in chair." "Pt was limited by fatigue following transfer to chair, no reports of pain through session" -Per RN, patient needs to be fed, needs cueing to swallow, though does not appear to have frank aspiration episode -she eats about 25% of each meal per calorie count,  -I have a Goals of care discussion with POA first cousin Dr Marijo Sanes  Who states patient is DNR, does not want  Feeding tube, she would like to proceed with EGD with dilation to see if this can help her eat better, she would also like to try remeron to help her appetite, I have discussed with her about palliative care to follow patient at snf, she states will reach out to snf MD to arrange palliative care.     Code Status: DNR, no feeding tube  Family Communication: patient   Disposition Plan: snf with palliative care on monday   Consultants:  Speech therapist  Social worker  Dietician   GI  Procedures:  MBS  EGD with dilation of esophageal stricture on 6/28 by Dr Lyndel Safe  Antibiotics: vanc/rocephin/zithromax x1 then zosyn to cover possible aspiration pneumonia, abx d/ced on 6/24   Objective: BP (!) 163/67 (BP Location: Left Arm)   Pulse 75   Temp 97.9 F (36.6 C) (Oral)   Resp 17   Ht 5' (1.524 m)   Wt 60 kg   SpO2  97%   BMI 25.83 kg/m   Intake/Output Summary (Last 24 hours) at 09/27/2018 7425 Last data filed at 09/27/2018 0200 Gross per 24 hour  Intake 950.77 ml  Output 1150 ml  Net -199.23 ml   Filed Weights   09/18/18 0120 09/25/18 1640  Weight: 60 kg 60 kg    Exam: Patient is examined daily including today on 09/27/2018, exams remain the same as of yesterday except that has changed    General:  Frail, calm, NAD, oriented to self only  Cardiovascular: RRR  Respiratory: diminished at basis, no wheezing, no rhonchi, no rales  Abdomen: Soft/ND/NT, positive BS  Musculoskeletal: No Edema  Neuro: alert, oriented to self only, calm and cooperative  Data Reviewed: Basic Metabolic Panel: Recent Labs  Lab 09/23/18 0336 09/24/18 0626 09/25/18 0340 09/26/18 0341 09/27/18 0401  NA 136 138 139 140 138  K 3.4* 3.0* 3.8 3.9 3.7  CL 102 107 107 105 105  CO2 22 21* 24 23 25   GLUCOSE 112* 130* 140* 128* 113*  BUN 5* 5* 6* 9 5*  CREATININE 0.45 0.40* 0.36* 0.47 0.38*  CALCIUM 8.0* 7.9* 8.8* 8.8* 8.0*  MG  --  1.8 1.9  --  1.8   Liver Function Tests: Recent Labs  Lab 09/24/18 0626 09/27/18 0401  AST 25 20  ALT 20 16  ALKPHOS  86 78  BILITOT 0.2* 0.4  PROT 6.5 6.4*  ALBUMIN 2.3* 2.3*   No results for input(s): LIPASE, AMYLASE in the last 168 hours. No results for input(s): AMMONIA in the last 168 hours. CBC: Recent Labs  Lab 09/22/18 0509 09/23/18 0336 09/24/18 0626 09/25/18 0340 09/26/18 0341  WBC 20.8* 21.6* 19.1* 17.3* 15.2*  NEUTROABS  --   --  13.0* 11.7* 9.8*  HGB 9.1* 9.7* 9.8* 10.0* 10.0*  HCT 30.0* 30.6* 31.9* 32.9* 32.4*  MCV 90.1 88.7 91.1 89.6 91.3  PLT 468* 428* 384 407* 393   Cardiac Enzymes:   No results for input(s): CKTOTAL, CKMB, CKMBINDEX, TROPONINI in the last 168 hours. BNP (last 3 results) No results for input(s): BNP in the last 8760 hours.  ProBNP (last 3 results) No results for input(s): PROBNP in the last 8760 hours.  CBG: Recent Labs   Lab 09/26/18 0749 09/26/18 1158 09/26/18 1606 09/26/18 2114 09/27/18 0758  GLUCAP 116* 98 101* 101* 98    Recent Results (from the past 240 hour(s))  Blood Culture (routine x 2)     Status: None   Collection Time: 09/17/18  7:34 PM   Specimen: BLOOD LEFT WRIST  Result Value Ref Range Status   Specimen Description   Final    BLOOD LEFT WRIST Performed at [facility] [address] [place], [place] [address]    Special Requests   Final    BOTTLES DRAWN AEROBIC AND ANAEROBIC Blood Culture results may not be optimal due to an inadequate volume of blood received in culture bottles Performed at [facility] [address] [place], [place] [address]    Culture   Final    NO GROWTH 5 DAYS Performed at [facility], [place] [address] [place], [place] [address]    Report Status 09/22/2018 FINAL  Final  SARS Coronavirus 2 (CEPHEID- Performed in [place] [facility] hospital lab), Hosp Order     Status: None   Collection Time: 09/17/18  7:35 PM   Specimen: Nasopharyngeal Swab  Result Value Ref Range Status   SARS Coronavirus 2 NEGATIVE NEGATIVE Final    Comment: (NOTE) If result is NEGATIVE SARS-CoV-2 target nucleic acids are NOT DETECTED. The SARS-CoV-2 RNA is generally detectable in upper and lower  respiratory specimens during the acute phase of infection. The lowest  concentration of SARS-CoV-2 viral copies this assay can detect is 250  copies / mL. A negative result does not preclude SARS-CoV-2 infection  and should not be used as the sole basis for treatment or other  patient management decisions.  A negative result may occur with  improper specimen collection / handling, submission of specimen other  than nasopharyngeal swab, presence of viral mutation(s) within the  areas targeted by this assay, and inadequate number of viral copies  (<250 copies / mL). A negative result must be combined with clinical  observations, patient history, and  epidemiological information. If result is POSITIVE SARS-CoV-2 target nucleic acids are DETECTED. The SARS-CoV-2 RNA is generally detectable in upper and lower  respiratory specimens dur ing the acute phase of infection.  Positive  results are indicative of active infection with SARS-CoV-2.  Clinical  correlation with patient history and other diagnostic information is  necessary to determine patient infection status.  Positive results do  not rule out bacterial infection or co-infection with other viruses. If result is PRESUMPTIVE POSTIVE SARS-CoV-2 nucleic acids MAY BE PRESENT.   A presumptive positive result was obtained  on the submitted specimen  and confirmed on repeat testing.  While 2019 novel coronavirus  (SARS-CoV-2) nucleic acids may be present in the submitted sample  additional confirmatory testing may be necessary for epidemiological  and / or clinical management purposes  to differentiate between  SARS-CoV-2 and other Sarbecovirus currently known to infect humans.  If clinically indicated additional testing with an alternate test  methodology [phone]) is advised. The SARS-CoV-2 RNA is generally  detectable in upper and lower respiratory sp ecimens during the acute  phase of infection. The expected result is Negative. Fact Sheet for Patients:  StrictlyIdeas.no Fact Sheet for Healthcare Providers: BankingDealers.co.za This test is not yet approved or cleared by the [place] FDA and has been authorized for detection and/or diagnosis of SARS-CoV-2 by FDA under an Emergency Use Authorization (EUA).  This EUA will remain in effect (meaning this test can be used) for the duration of the COVID-19 declaration under Section 564(b)(1) of the Act, 21 U.S.C. section 360bbb-3(b)(1), unless the authorization is terminated or revoked sooner. Performed at [facility], [place] [address] [place], [place] [address]    Blood Culture (routine x 2)     Status: None   Collection Time: 09/17/18  7:39 PM   Specimen: BLOOD RIGHT FOREARM  Result Value Ref Range Status   Specimen Description   Final    BLOOD RIGHT FOREARM Performed at [facility], [place] [address], [place], [place] [address]    Special Requests   Final    Blood Culture results may not be optimal due to an inadequate volume of blood received in culture bottles BOTTLES DRAWN AEROBIC AND ANAEROBIC Performed at [facility], [place] [address], [place], [place] [address]    Culture   Final    NO GROWTH 5 DAYS Performed at [facility], [place] [address] [place], [place] [address]    Report Status 09/22/2018 FINAL  Final  MRSA PCR Screening     Status: None   Collection Time: 09/18/18 10:45 AM   Specimen: Nasopharyngeal  Result Value Ref Range Status   MRSA by PCR NEGATIVE NEGATIVE Final    Comment:        The GeneXpert MRSA Assay (FDA approved for NASAL specimens only), is one component of a comprehensive MRSA colonization surveillance program. It is not intended to diagnose MRSA infection nor to guide or monitor treatment for MRSA infections. Performed at [facility], [place] [address] [place], [place] [address]   SARS Coronavirus 2 (CEPHEID - Performed in [place] hospital lab), Hosp Order     Status: None   Collection Time: 09/18/18  1:01 PM   Specimen: Nasopharyngeal Swab  Result Value Ref Range Status   SARS Coronavirus 2 NEGATIVE NEGATIVE Final    Comment: (NOTE) If result is NEGATIVE SARS-CoV-2 target nucleic acids are NOT DETECTED. The SARS-CoV-2 RNA is generally detectable in upper and lower  respiratory specimens during the acute phase of infection. The lowest  concentration of SARS-CoV-2 viral copies this assay can detect is 250  copies / mL. A negative result does not preclude SARS-CoV-2 infection  and should not be used as the sole basis for treatment or other  patient  management decisions.  A negative result may occur with  improper specimen collection / handling, submission of specimen other  than nasopharyngeal swab, presence of viral mutation(s) within the  areas targeted by this assay, and inadequate number of viral copies  (<250 copies /  mL). A negative result must be combined with clinical  observations, patient history, and epidemiological information. If result is POSITIVE SARS-CoV-2 target nucleic acids are DETECTED. The SARS-CoV-2 RNA is generally detectable in upper and lower  respiratory specimens dur ing the acute phase of infection.  Positive  results are indicative of active infection with SARS-CoV-2.  Clinical  correlation with patient history and other diagnostic information is  necessary to determine patient infection status.  Positive results do  not rule out bacterial infection or co-infection with other viruses. If result is PRESUMPTIVE POSTIVE SARS-CoV-2 nucleic acids MAY BE PRESENT.   A presumptive positive result was obtained on the submitted specimen  and confirmed on repeat testing.  While 2019 novel coronavirus  (SARS-CoV-2) nucleic acids may be present in the submitted sample  additional confirmatory testing may be necessary for epidemiological  and / or clinical management purposes  to differentiate between  SARS-CoV-2 and other Sarbecovirus currently known to infect humans.  If clinically indicated additional testing with an alternate test  methodology [phone]) is advised. The SARS-CoV-2 RNA is generally  detectable in upper and lower respiratory sp ecimens during the acute  phase of infection. The expected result is Negative. Fact Sheet for Patients:  StrictlyIdeas.no Fact Sheet for Healthcare Providers: BankingDealers.co.za This test is not yet approved or cleared by the [place] FDA and has been authorized for detection and/or diagnosis of SARS-CoV-2 by FDA under  an Emergency Use Authorization (EUA).  This EUA will remain in effect (meaning this test can be used) for the duration of the COVID-19 declaration under Section 564(b)(1) of the Act, 21 U.S.C. section 360bbb-3(b)(1), unless the authorization is terminated or revoked sooner. Performed at [facility], [place] [address], [place], [place] [address]   Urine culture     Status: None   Collection Time: 09/19/18  6:07 AM   Specimen: Urine, Clean Catch  Result Value Ref Range Status   Specimen Description   Final    URINE, CLEAN CATCH Performed at [facility], [place] [address], [place], [place] [address]    Special Requests   Final    NONE Performed at [facility], [place] [address] [place], [place] [address]    Culture   Final    NO GROWTH Performed at [facility], [place] [address], [place], [place] [address]    Report Status 09/20/2018 FINAL  Final  Novel Coronavirus,NAA,(SEND-OUT TO REF LAB - TAT 24-48 hrs); Hosp Order     Status: None   Collection Time: 09/23/18  8:45 PM   Specimen: Oropharyngeal swab; Respiratory  Result Value Ref Range Status   SARS-CoV-2, NAA NOT DETECTED NOT DETECTED Final    Comment: (NOTE) This test was developed and its performance characteristics determined by [employer]. This test has not been FDA cleared or approved. This test has been authorized by FDA under an Emergency Use Authorization (EUA). This test is only authorized for the duration of time the declaration that circumstances exist justifying the authorization of the emergency use of in vitro diagnostic tests for detection of SARS-CoV-2 virus and/or diagnosis of COVID-19 infection under section 564(b)(1) of the Act, 21 U.S.C. 932IZT-2(W)(5), unless the authorization is terminated or revoked sooner. When diagnostic testing is negative, the possibility of a false negative result should be considered in the context of a patient's  recent exposures and the presence of clinical signs and symptoms consistent with COVID-19. An individual without symptoms of COVID-19 and who  is not shedding SARS-CoV-2 virus would expect to have a negative (not detected) result in this assay. Performed  At: [facility] [place], [place] [id number] Rush Farmer MD GQ:[id number]    [place]  Final    Comment: Performed at [facility] [address], [place], [place] [address]  Culture, body fluid-bottle     Status: None (Preliminary result)   Collection Time: 09/25/18  4:53 PM   Specimen: Pleura  Result Value Ref Range Status   Specimen Description PLEURAL  Final   Special Requests NONE  Final   Culture   Final    NO GROWTH < 24 HOURS Performed at [facility], [place] [address], [place], [place] [address]    Report Status PENDING  Incomplete  Gram stain     Status: None   Collection Time: 09/25/18  4:53 PM   Specimen: Pleura  Result Value Ref Range Status   Specimen Description PLEURAL  Final   Special Requests NONE  Final   Gram Stain   Final    NO WBC SEEN NO ORGANISMS SEEN Performed at [facility], [address], [place], [place] [address]    Report Status 09/26/2018 FINAL  Final     Studies: No results found.  Scheduled Meds: . aspirin EC  81 mg Oral Daily  . calcium-vitamin D  1 tablet Oral Q breakfast  . feeding supplement (ENSURE ENLIVE)  237 mL Oral BID BM  . insulin aspart  0-5 Units Subcutaneous QHS  . insulin aspart  0-9 Units Subcutaneous TID WC  . loratadine  10 mg Oral Daily  . memantine  28 mg Oral Daily  . mirtazapine  15 mg Oral QHS  . multivitamin with minerals  1 tablet Oral Daily  . omega-3 acid ethyl esters  1 g Oral Daily  . pantoprazole  20 mg Oral Daily  . rivastigmine  9.5 mg Transdermal Daily    Continuous Infusions: . sodium chloride 75 mL/hr at 09/27/18 0200  . magnesium sulfate bolus IVPB        Time spent: 70mins, case discussed with GI Dr  Lyndel Safe I have personally reviewed and interpreted on  09/27/2018 daily labs,  imagings as discussed above under date review session and assessment and plans.  I reviewed all nursing notes, pharmacy notes, consultant notes,  vitals, pertinent old records  I have discussed plan of care as described above with RN , patient  on 09/27/2018   Florencia Reasons MD, PhD  [facility] Pager [phone]. If 7PM-7AM, please contact night-coverage at [email or web address], password [email or web address] 09/27/2018, 8:29 AM  LOS: 10 days

## 2018-09-27 NOTE — Progress Notes (Signed)
Pt has 3 negative COVID tests at [facility] in the past week, one neg from her facility. Flu neg and MRSA neg. On-Call MD paged regarding contact/droplet precautions. MD discontinued precaution orders.

## 2018-09-27 NOTE — Anesthesia Postprocedure Evaluation (Signed)
Anesthesia Post Note  Patient: Grace Gilbert  Procedure(s) Performed: ESOPHAGOGASTRODUODENOSCOPY (EGD) WITH PROPOFOL (N/A )     Patient location during evaluation: PACU Anesthesia Type: MAC Level of consciousness: awake and alert Pain management: pain level controlled Vital Signs Assessment: post-procedure vital signs reviewed and stable Respiratory status: spontaneous breathing, nonlabored ventilation, respiratory function stable and patient connected to nasal cannula oxygen Cardiovascular status: stable and blood pressure returned to baseline Postop Assessment: no apparent nausea or vomiting Anesthetic complications: no    Last Vitals:  Vitals:   09/27/18 1320 09/27/18 1323  BP: (!) 151/48 (!) 145/50  Pulse: 69 68  Resp: 17 15  Temp:    SpO2: 97% 100%    Last Pain:  Vitals:   09/27/18 1323  TempSrc:   PainSc: 0-No pain                 Rolando Whitby A.

## 2018-09-27 NOTE — Anesthesia Procedure Notes (Signed)
Date/Time: 09/27/2018 12:47 PM Performed by: Glory Buff, CRNA Oxygen Delivery Method: Nasal cannula

## 2018-09-27 NOTE — Op Note (Signed)
[facility] Patient Name: Grace Gilbert Procedure Date: 09/27/2018 MRN: [id number] Attending MD: Jackquline Denmark , MD Date of Birth: 02/08/1933 CSN: [id number] Age: 85 Admit Type: Inpatient Procedure:                Upper GI endoscopy Indications:              Abnormal UGI series Providers:                Jackquline Denmark, MD, Burtis Junes, RN, Cletis Athens,                            Technician Referring MD:              Medicines:                Monitored Anesthesia Care Complications:            No immediate complications. Estimated Blood Loss:     Estimated blood loss: none. Procedure:                Pre-Anesthesia Assessment:                           - Prior to the procedure, a History and Physical                            was performed, and patient medications and                            allergies were reviewed. The patient's tolerance of                            previous anesthesia was also reviewed. The risks                            and benefits of the procedure and the sedation                            options and risks were discussed with the patient.                            All questions were answered, and informed consent                            was obtained. Prior Anticoagulants: The patient has                            taken no previous anticoagulant or antiplatelet                            agents. ASA Grade Assessment: IV - A patient with                            severe systemic disease that is a constant threat  to life. After reviewing the risks and benefits,                            the patient was deemed in satisfactory condition to                            undergo the procedure.                           After obtaining informed consent, the endoscope was                            passed under direct vision. Throughout the                            procedure, the patient's blood pressure, pulse, and                          oxygen saturations were monitored continuously. The                            GIF-H190 ([id number]) Olympus gastroscope was                            introduced through the mouth, and advanced to the                            second part of duodenum. The upper GI endoscopy was                            accomplished without difficulty. The patient                            tolerated the procedure well. Scope In: Scope Out: Findings:      The esophagus was mildly dilated with barium in the esophagus limiting       examination to some extent. One benign-appearing, intrinsic mild       stricture was noted at the GE junction 32 cm from the incisors. This       stenosis measured 1.2 cm (inner diameter). The stenosis was traversed.       The folds were slightly more prominent at the GE junction. Multiple       biopsies were obtained directed by NBI and sent for histology. The scope       was withdrawn. Dilation was performed with a Maloney dilator with mild       resistance at 50 Fr.      A small hiatal hernia was present.      The entire examined stomach was normal including the retroflexed       examination.      The examined duodenum was normal. Impression:               -Distal esophageal stricture s/p dilatation.                           -Small hiatal hernia.                           -  Otherwise normal EGD. Moderate Sedation:      Not Applicable - Patient had care per Anesthesia. Recommendation:           - Return patient to hospital ward for ongoing care.                           - Soft diet today. Aspiration precautions. Feed and                            give medications in sitting position.                           - Continue Protonix 40 mg p.o. once a day                           - Await pathology results.                           - Return to GI clinic PRN. If continues to have                            problems, would consider esophageal manometry.  She                            could have underlying motility problems, doubt                            achalasia as suggested by radiology. Further                            interventions would be challenging due to advanced                            dementia. Procedure Code(s):        --- Professional ---                           [phone], Esophagogastroduodenoscopy, flexible,                            transoral; with biopsy, single or multiple                           43450, Dilation of esophagus, by unguided sound or                            bougie, single or multiple passes Diagnosis Code(s):        --- Professional ---                           K22.2, Esophageal obstruction                           K44.9, Diaphragmatic hernia without obstruction or  gangrene                           R93.3, Abnormal findings on diagnostic imaging of                            other parts of digestive tract CPT copyright 2019 [employer]. All rights reserved. The codes documented in this report are preliminary and upon coder review may  be revised to meet current compliance requirements. Jackquline Denmark, MD 09/27/2018 1:15:04 PM This report has been signed electronically. Number of Addenda: 0

## 2018-09-28 ENCOUNTER — Encounter (HOSPITAL_COMMUNITY): Payer: Self-pay | Admitting: Gastroenterology

## 2018-09-28 LAB — BASIC METABOLIC PANEL
Anion gap: 9 (ref 5–15)
BUN: 9 mg/dL (ref 8–23)
CO2: 27 mmol/L (ref 22–32)
Calcium: 8 mg/dL — ABNORMAL LOW (ref 8.9–10.3)
Chloride: 102 mmol/L (ref 98–111)
Creatinine, Ser: 0.55 mg/dL (ref 0.44–1.00)
GFR calc Af Amer: >60 mL/min (ref >60)
GFR calc non Af Amer: >60 mL/min (ref >60)
Glucose, Bld: 157 mg/dL — ABNORMAL HIGH (ref 70–99)
Potassium: 3.3 mmol/L — ABNORMAL LOW (ref 3.5–5.1)
Sodium: 138 mmol/L (ref 135–145)

## 2018-09-28 LAB — CBC WITH DIFFERENTIAL/PLATELET
Abs Immature Granulocytes: 0.43 10*3/uL — ABNORMAL HIGH (ref 0.00–0.07)
Basophils Absolute: 0.1 10*3/uL (ref 0.0–0.1)
Basophils Relative: 1 %
Eosinophils Absolute: 0.3 10*3/uL (ref 0.0–0.5)
Eosinophils Relative: 2 %
HCT: 32.8 % — ABNORMAL LOW (ref 36.0–46.0)
Hemoglobin: 9.8 g/dL — ABNORMAL LOW (ref 12.0–15.0)
Immature Granulocytes: 3 %
Lymphocytes Relative: 11 %
Lymphs Abs: 1.8 10*3/uL (ref 0.7–4.0)
MCH: 27.1 pg (ref 26.0–34.0)
MCHC: 29.9 g/dL — ABNORMAL LOW (ref 30.0–36.0)
MCV: 90.6 fL (ref 80.0–100.0)
Monocytes Absolute: 1.5 10*3/uL — ABNORMAL HIGH (ref 0.1–1.0)
Monocytes Relative: 9 %
Neutro Abs: 12 10*3/uL — ABNORMAL HIGH (ref 1.7–7.7)
Neutrophils Relative %: 74 %
Platelets: 410 10*3/uL — ABNORMAL HIGH (ref 150–400)
RBC: 3.62 MIL/uL — ABNORMAL LOW (ref 3.87–5.11)
RDW: 17.7 % — ABNORMAL HIGH (ref 11.5–15.5)
WBC: 16.1 10*3/uL — ABNORMAL HIGH (ref 4.0–10.5)
nRBC: 0.1 % (ref 0.0–0.2)

## 2018-09-28 LAB — PH, BODY FLUID: pH, Body Fluid: 7.5

## 2018-09-28 LAB — MAGNESIUM: Magnesium: 2.3 mg/dL (ref 1.7–2.4)

## 2018-09-28 LAB — GLUCOSE, CAPILLARY
Glucose-Capillary: 118 mg/dL — ABNORMAL HIGH (ref 70–99)
Glucose-Capillary: 124 mg/dL — ABNORMAL HIGH (ref 70–99)

## 2018-09-28 MED ORDER — POTASSIUM CHLORIDE 20 MEQ PO PACK
40.0000 meq | PACK | Freq: Once | ORAL | Status: DC
  Filled 2018-09-28: qty 2

## 2018-09-28 MED ORDER — MIRTAZAPINE 15 MG PO TBDP: 15.0000 mg | ORAL_TABLET | Freq: Every day | ORAL | 0 refills | Status: DC

## 2018-09-28 MED ORDER — ADULT MULTIVITAMIN LIQUID CH
15.0000 mL | Freq: Every day | ORAL | Status: DC
  Filled 2018-09-28: qty 15

## 2018-09-28 MED ORDER — PANTOPRAZOLE SODIUM 40 MG PO PACK
20.0000 mg | PACK | Freq: Every day | ORAL | Status: DC
  Filled 2018-09-28: qty 20

## 2018-09-28 MED ORDER — ADULT MULTIVITAMIN LIQUID CH: 15.0000 mL | Freq: Every day | ORAL | 0 refills | Status: AC

## 2018-09-28 MED ORDER — ENSURE ENLIVE PO LIQD: 237.0000 mL | Freq: Two times a day (BID) | ORAL | 12 refills | Status: DC

## 2018-09-28 NOTE — Care Management Important Message (Signed)
Important Message  Patient Details IM Letter given to [name]  SW to present to the Patient Name: Grace Gilbert MRN: [id number] Date of Birth: 09/19/1932   Medicare Important Message Given:  Yes     Kerin Salen 09/28/2018, 11:36 AM

## 2018-09-28 NOTE — TOC Transition Note (Signed)
Transition of Care [facility]) - CM/SW Discharge Note   Patient Details  Name: MARSHELLE BILGER MRN: [id number] Date of Birth: [birth year]  Transition of Care [facility]) CM/SW Contact:  Lia Hopping, [place] Phone Number: 09/28/2018, 3:27 PM   Clinical Narrative:    D/C Summary sent.  [facility] arranged for transport.  Nurse given the number to call report.    Final next level of care: Skilled Nursing Facility Barriers to Discharge: Continued Medical Work up   Patient Goals and CMS Choice   CMS Medicare.gov Compare Post Acute Care list provided to:: (Patient is from SNF.)    Discharge Placement              Patient chooses bed at: [facility] Patient to be transferred to facility by: [facility] Name of family member notified: Benjamine Mola Patient and family notified of of transfer: 09/28/18  Discharge Plan and Services In-house Referral: NA Discharge Planning Services: NA Post Acute Care Choice: [place]                               Social Determinants of Health (SDOH) Interventions     Readmission Risk Interventions Readmission Risk Prevention Plan 09/22/2018  Home Care Screening Complete  Some recent data might be hidden

## 2018-09-28 NOTE — Discharge Summary (Signed)
Physician Discharge Summary  Patient ID: Grace Gilbert MRN: [id number] DOB/AGE: March 30, 1933 85 y.o.  Admit date: 09/17/2018 Discharge date: 09/28/2018  Admission Diagnoses:  Discharge Diagnoses:  Active Problems:   HCAP (healthcare-associated pneumonia)   Acute hypoxemic respiratory failure (HCC)   Lobar pneumonia (HCC)   FTT (failure to thrive) in adult   Vascular dementia without behavioral disturbance (HCC)   Leukocytosis   Normocytic anemia   Thrombocytosis (HCC)   Pleural effusion on left   Dysphagia   Discharged Condition: stable  Hospital Course: [name] a, 85 year old Caucasian female with past medical history significant for Alzheimer's dementia, hypertension, hyperlipidemia, GERD and diabetes6.  Patient presented to the emergency department for evaluation of fever, cough, shortness of breath. Patient was sent to the emergency department by her nursing home because she had developed symptoms of fever, shortness of breath and cough. Chest x-ray done as inpatient was read as pneumonia. Her white blood cell count was 24,000. Of note, patient tested negative for COVID 2 days prior to presentation at her facility.Patient did have a positive COVID contact, her nurse aide tested positive on 09/09/2018. Due to patient's high risk history, patient had a repeat COVID test which returned negative.  Acute hypoxic respiratory failure/likely from aspiration pneumonia/with left sided pleural effusion (exudative): -Chest x-ray on presentation revealed "Left pleural effusion and basilar consolidation, probably pneumonia." -MBS on 09/21/2018 showed mild aspiration risk -urine culture was negative, blood culture was negative, mrsa screening was negative, covid testing was negative x1 from the facility, negative x3 since admitted to the hospital, (reports exposure on 09/09/2018) -Patient was initially treated with vanc/rocephin/zithromax, then zosyn was continued to cover possible  aspiration pneumonia. -Patient completed total of 7 days treatment as of 09/23/2018. -Patient has been observed off antibiotics.  -Repeat procalcitonin remained low at 0.1 -Rrepeat ct chest revealed large left sided pleural effusion and fluid-filled esophagus" s/p thoracentesis and dg esophagus on 6/26 2020.  -pleural fluid revealed negative gram stain, culture did not grow any organisms.  Persistent leukocytosis/normocytic anemia/mild thrombocytosis -cbc was normal in March, 2020.   -repeat procalcitonin remain low at 0.1, repeat  inflammatory markers improved with decreasing ddimer and ferritin,   CT chest "With large left sided pleural effusion with near complete collapse of the left lower lobe. Fluid-filled esophagus to the level of the upper thorax, which places the patient at risk for aspiration. There are a few hyperdense foci within the collapsed left lower lobe which could represent aspirated contrast."  S/p US guided thoracentesis with fluids study on 6/26 pleural fluids gram stain negative, culture no growth, cytology pending Wbc coming down, thrombocytosis resolved after thoracentesis  Esophageal stricture Per Speech there is no significant oropharyngeal phase aspiration by MBS, started ppi to decrease reflux,   dg esophagus showed esophageal dysmotility with  stricture at GE junction GI consulted, s/p EGD with dilation, biopsy pending  Hypokalemia:  Potassium was replaced.  Continue to monitor.    Diet controlled dm2 On ssi in the hospital, stable Continue to monitor.  Dementia: on namenda and exelon -Continue to manage expectantly. -POA is patient's first cousin, Dr Marijo Sanes, psychiatrist.    Consults: GI  Significant Diagnostic Studies:  Esophagram/barium swallow: Limited single contrast evaluation in the prone position.  Overall appearance favors esophageal dysmotility with achalasia. However, a malignant stricture at the GE junction is  not excluded. Endoscopic correlation is suggested.  Left-sided thoracentesis: Successful ultrasound guided left thoracentesis yielding 200 mL of pleural fluid.  Endoscopy:  -Distal esophageal stricture  s/p dilatation. -Small hiatal hernia. -Otherwise normal EGD.  Discharge Exam: Blood pressure (!) 144/56, pulse 67, temperature 98.7 F (37.1 C), temperature source Oral, resp. rate 15, height 5' (1.524 m), weight 60 kg, SpO2 94 %.   Disposition: Discharge disposition: 03-Skilled Nursing Facility   Discharge Instructions    Diet - low sodium heart healthy   Complete by: As directed    Increase activity slowly   Complete by: As directed    Increase activity slowly   Complete by: As directed      Allergies as of 09/28/2018      Reactions   Aricept [donepezil Hcl] Diarrhea   Crestor [rosuvastatin Calcium]    Myalgia   Lipitor [atorvastatin Calcium]    Lovastatin    Myalgia   Micardis [telmisartan]    Fatigue   Sertraline Diarrhea   Welchol [colesevelam Hcl]    Constipation   Zetia [ezetimibe]    Myalgia   Zocor [simvastatin]    Myalgia      Medication List    TAKE these medications   acetaminophen 325 MG tablet Commonly known as: TYLENOL Take 650 mg by mouth every 6 (six) hours as needed for headache.   alendronate 70 MG tablet Commonly known as: FOSAMAX TAKE 1 TAB ONCE A WEEK, AT LEAST 30 MIN BEFORE 1ST FOOD.DO NOT LIE DOWN FOR 30 MIN AFTER TAKING. What changed: See the new instructions.   aspirin EC 81 MG tablet Take 1 tablet (81 mg total) by mouth daily.   calcium-vitamin D 500-200 MG-UNIT tablet Commonly known as: OSCAL WITH D Take 1 tablet by mouth daily with breakfast.   cetirizine 10 MG tablet Commonly known as: ZYRTEC Take 10 mg by mouth at bedtime.   feeding supplement (ENSURE ENLIVE) Liqd Take 237 mLs by mouth 2 (two) times daily between meals.   Fish Oil Omega-3 1000 MG Caps Take 1 capsule by mouth daily.   memantine 28 MG Cp24 24 hr  capsule Commonly known as: NAMENDA XR TAKE ONE CAPSULE ONCE A DAY TO PRESERVE MEMORY. What changed: See the new instructions.   mirtazapine 15 MG disintegrating tablet Commonly known as: REMERON SOL-TAB Take 1 tablet (15 mg total) by mouth at bedtime for 30 days.   multivitamin Liqd Take 15 mLs by mouth daily for 30 days.   rivastigmine 9.5 mg/24hr Commonly known as: EXELON Apply fresh patch daily and remove old patch to help preserve memory      Follow-up Information    [name], MD Follow up.   Specialty: Internal Medicine Contact information: [place] [address] [phone]        Jackquline Denmark, MD Follow up.   Specialties: Gastroenterology, Internal Medicine Why: for EGD biopsy result  Contact information: [address] [place] [address] [phone]           Signed: Bonnell Public 09/28/2018, 12:40 PM

## 2018-09-29 ENCOUNTER — Encounter: Payer: Self-pay | Admitting: Internal Medicine

## 2018-09-29 NOTE — Progress Notes (Signed)
Opened in Error.

## 2018-09-30 ENCOUNTER — Encounter: Payer: Self-pay | Admitting: Internal Medicine

## 2018-09-30 ENCOUNTER — Non-Acute Institutional Stay (SKILLED_NURSING_FACILITY): Payer: Medicare Other | Admitting: Internal Medicine

## 2018-09-30 DIAGNOSIS — J90 Pleural effusion, not elsewhere classified: Secondary | ICD-10-CM | POA: Diagnosis not present

## 2018-09-30 DIAGNOSIS — K222 Esophageal obstruction: Secondary | ICD-10-CM

## 2018-09-30 DIAGNOSIS — J189 Pneumonia, unspecified organism: Secondary | ICD-10-CM

## 2018-09-30 DIAGNOSIS — G301 Alzheimer's disease with late onset: Secondary | ICD-10-CM | POA: Diagnosis not present

## 2018-09-30 DIAGNOSIS — F0280 Dementia in other diseases classified elsewhere without behavioral disturbance: Secondary | ICD-10-CM

## 2018-09-30 DIAGNOSIS — R627 Adult failure to thrive: Secondary | ICD-10-CM

## 2018-09-30 DIAGNOSIS — M810 Age-related osteoporosis without current pathological fracture: Secondary | ICD-10-CM

## 2018-09-30 LAB — CULTURE, BODY FLUID W GRAM STAIN -BOTTLE: Culture: NO GROWTH

## 2018-09-30 NOTE — Progress Notes (Signed)
Provider: Veleta Miners L,MD  Location:  [facility] Room Number: 32/A Place of Service:  SNF (31)  PCP: [name], MD Patient Care Team: [name], MD as PCP - General (Internal Medicine) Lindwood Coke, MD as Consulting Physician (Dermatology) Christophe Louis, MD as Consulting Physician (Obstetrics and Gynecology) Latanya Maudlin, MD as Consulting Physician (Orthopedic Surgery) Garlan Fair, MD as Consulting Physician (Gastroenterology) Barnett Abu., MD as Consulting Physician (Cardiology) Neldon Mc, Donnamarie Poag, MD as Consulting Physician (Allergy and Immunology) Rexene Alberts, MD as Consulting Physician (Cardiothoracic Surgery) Melida Quitter, MD as Consulting Physician (Otolaryngology) Ngetich, Nelda Bucks, NP as Nurse Practitioner (Family Medicine)  Extended Emergency Contact Information Primary Emergency Contact: Kathie Rhodes of [place] Phone: [phone] Mobile Phone: [phone] Relation: Legal Guardian Secondary Emergency Contact: Leana [place] of [place] Phone: [phone] Relation: Uncle  Code Status:DNR Goals of Care: Advanced Directive information Advanced Directives 09/30/2018  Does Patient Have a Medical Advance Directive? Yes  Type of Advance Directive [place]  Does patient want to make changes to medical advance directive? No - Patient declined  Copy of [place] in Chart? -  Pre-existing out of facility DNR order (yellow form or pink MOST form) -      Chief Complaint  Patient presents with  . Readmit To SNF    Readmit to facility     HPI: Patient is a 85 y.o. female seen today for admission to SNF for therapy and then Long term Care She was in the Hospital from 6/18-6/29 for HCAP and Pleural Effusion Patient has h/o Hyperlipidemia, Osteoporosis, Allergic Rhinitis and Dementia Alzheimer  She stays in SNF for Long term care due to her  due to Progressive dementia She was send to the hospital for SOB, Hypoxia, Fever and Leucocytosis She was found to have Left sided infiltrate with Pleural Effusion Her Covid test 3 times was negative. She also underwent Thoracocentesis and Cultures were negative. Was treated with Antibiotics for 7 Days. MBS showed Mild risk for aspiration But her CT scan of Chest showed ? Esophageal Stricture. She underwent EGD with Dilatation on 6/28 Patient is back to Facility She is getting therapy. Doing well. No Fever or cough. More weak. Unable to walk or feed by herself. Unable to give any history   Past Medical History:  Diagnosis Date  . Abnormal CT scan, chest September 2011   Scar tissue  . Allergic rhinitis   . Alzheimer's dementia ([place])   . Asthma   . Diabetes mellitus    borderline and under control-diet ,exercise  . Esophageal stricture   . Family history of colon cancer    Mother  . GERD (gastroesophageal reflux disease)   . Hiatal hernia   . HLD (hyperlipidemia)   . Hypertension   . Osteopenia   . Pneumonia 12-15 yrs.ago   yrs. ago  . TIA (transient ischemic attack)   . Vitamin D deficiency    Past Surgical History:  Procedure Laterality Date  . ABDOMINAL HYSTERECTOMY  1980   . BALLOON DILATION N/A 09/27/2018   Procedure: BALLOON DILATION;  Surgeon: Jackquline Denmark, MD;  Location: [facility];  Service: Endoscopy;  Laterality: N/A;  . BIOPSY  09/27/2018   Procedure: BIOPSY;  Surgeon: Jackquline Denmark, MD;  Location: [facility];  Service: Endoscopy;;  . CATARACT EXTRACTION Left 2014  . ESOPHAGOGASTRODUODENOSCOPY (EGD) WITH PROPOFOL N/A 09/27/2018   Procedure: ESOPHAGOGASTRODUODENOSCOPY (EGD) WITH PROPOFOL;  Surgeon: Lyndel Safe,  Peyton Bottoms, MD;  Location: [facility];  Service: Endoscopy;  Laterality: N/A;  . SHOULDER OPEN ROTATOR CUFF REPAIR  05/22/2011   Procedure: ROTATOR CUFF REPAIR SHOULDER OPEN;  Surgeon: Tobi Bastos, MD;  Location: [facility];  Service: Orthopedics;  Laterality:  Left;  . THORACOTOMY / DECORTICATION PARIETAL PLEURA Right 204   empyema  . TONSILLECTOMY      reports that she quit smoking about 31 years ago. Her smoking use included cigarettes. She has a 30.00 pack-year smoking history. She has never used smokeless tobacco. She reports that she does not drink alcohol or use drugs. Social History   Socioeconomic History  . Marital status: Single    Spouse name: Not on file  . Number of children: Not on file  . Years of education: Not on file  . Highest education level: Not on file  Occupational History  . Occupation: retired Tour manager  . Financial resource strain: Not on file  . Food insecurity    Worry: Not on file    Inability: Not on file  . Transportation needs    Medical: Not on file    Non-medical: Not on file  Tobacco Use  . Smoking status: Former Smoker    Packs/day: 1.50    Years: 20.00    Pack years: 30.00    Types: Cigarettes    Quit date: 05/15/1987    Years since quitting: 31.4  . Smokeless tobacco: Never Used  Substance and Sexual Activity  . Alcohol use: No    Alcohol/week: 1.0 standard drinks    Types: 1 Glasses of wine per week  . Drug use: No  . Sexual activity: Never  Lifestyle  . Physical activity    Days per week: Not on file    Minutes per session: Not on file  . Stress: Not on file  Relationships  . Social Herbalist on phone: Not on file    Gets together: Not on file    Attends religious service: Not on file    Active member of club or organization: Not on file    Attends meetings of clubs or organizations: Not on file    Relationship status: Not on file  . Intimate partner violence    Fear of current or ex partner: Not on file    Emotionally abused: Not on file    Physically abused: Not on file    Forced sexual activity: Not on file  Other Topics Concern  . Not on file  Social History Narrative   L[facility] at [facility] since 07/27/2014   Previous math Pharmacist, hospital.   Never  married   Former smoker stopped 1989   Alcohol occasionally    POA niece Terance Hart [place], [place]    Functional Status Survey:    No family history on file.  Health Maintenance  Topic Date Due  . FOOT EXAM  12/06/1942  . OPHTHALMOLOGY EXAM  12/06/1942  . URINE MICROALBUMIN  02/11/2017  . INFLUENZA VACCINE  10/31/2018  . HEMOGLOBIN A1C  03/28/2019  . TETANUS/TDAP  12/16/2021  . DEXA SCAN  Completed  . PNA vac Low Risk Adult  Completed    Allergies  Allergen Reactions  . Aricept [Donepezil Hcl] Diarrhea  . Crestor [Rosuvastatin Calcium]     Myalgia  . Lipitor [Atorvastatin Calcium]   . Lovastatin     Myalgia  . Micardis [Telmisartan]     Fatigue  . Sertraline Diarrhea  . Welchol [employer]  Hcl]     Constipation  . Zetia [Ezetimibe]     Myalgia  . Zocor [Simvastatin]     Myalgia    Outpatient Encounter Medications as of 09/30/2018  Medication Sig  . acetaminophen (TYLENOL) 325 MG tablet Take 650 mg by mouth every 6 (six) hours as needed for headache.  . alendronate (FOSAMAX) 70 MG tablet TAKE 1 TAB ONCE A WEEK, AT LEAST 30 MIN BEFORE 1ST FOOD.DO NOT LIE DOWN FOR 30 MIN AFTER TAKING. (Patient taking differently: Take 70 mg by mouth once a week. Take 70mg  30 mins before 1st meal of the day. Do not lie down for 30 mins after taking.)  . Calcium Carbonate-Vitamin D (LIQUID CALCIUM/VITAMIN D) 600-200 MG-UNIT CAPS Take 1 tablet by mouth.  . cetirizine (ZYRTEC) 10 MG tablet Take 10 mg by mouth at bedtime.   . lactose free nutrition (BOOST) LIQD Take 237 mLs by mouth 2 (two) times daily between meals.  . memantine (NAMENDA XR) 28 MG CP24 24 hr capsule TAKE ONE CAPSULE ONCE A DAY TO PRESERVE MEMORY.  . mirtazapine (REMERON SOL-TAB) 15 MG disintegrating tablet Take 1 tablet (15 mg total) by mouth at bedtime for 30 days.  . Multiple Vitamin (MULTIVITAMIN) LIQD Take 15 mLs by mouth daily for 30 days.  . Omega-3 Fatty Acids (FISH OIL OMEGA-3) 1000 MG CAPS Take 1 capsule by  mouth daily.  . rivastigmine (EXELON) 9.5 mg/24hr Apply fresh patch daily and remove old patch to help preserve memory  . zinc oxide 20 % ointment Apply 1 application topically as needed for irritation. Apply to buttocks/peri topical as needed   No facility-administered encounter medications on file as of 09/30/2018.     Review of Systems  Unable to perform ROS: Dementia    Vitals:   09/30/18 0904  BP: 116/62  Pulse: 96  Resp: 18  Temp: (!) 97.2 F (36.2 C)  SpO2: 92%  Weight: 126 lb 9.6 oz (57.4 kg)  Height: 5' (1.524 m)   Body mass index is 24.72 kg/m. Physical Exam Vitals signs reviewed.  Constitutional:      Appearance: Normal appearance.  HENT:     Head: Normocephalic.     Nose: Nose normal.     Mouth/Throat:     Mouth: Mucous membranes are moist.     Pharynx: Oropharynx is clear.  Eyes:     Pupils: Pupils are equal, round, and reactive to light.  Neck:     Musculoskeletal: Neck supple.  Cardiovascular:     Rate and Rhythm: Normal rate and regular rhythm.     Pulses: Normal pulses.     Heart sounds: Normal heart sounds.  Pulmonary:     Effort: Pulmonary effort is normal.     Breath sounds: Normal breath sounds. No rales.  Abdominal:     General: Abdomen is flat. Bowel sounds are normal.     Palpations: Abdomen is soft.  Musculoskeletal:        General: No swelling.  Skin:    General: Skin is warm and dry.  Neurological:     General: No focal deficit present.     Mental Status: She is alert.     Comments: Not Oriented  Psychiatric:     Comments: Seems Anxious and Confused     Labs reviewed: Basic Metabolic Panel: Recent Labs    09/18/18 0120  09/19/18 0427  09/25/18 0340 09/26/18 0341 09/27/18 0401 09/28/18 0401  NA  --    < > 138   < >  139 140 138 138  K  --    < > 3.6   < > 3.8 3.9 3.7 3.3*  CL  --    < > 105   < > 107 105 105 102  CO2  --    < > 25   < > 24 23 25 27   GLUCOSE  --    < > 128*   < > 140* 128* 113* 157*  BUN  --    < > 5*    < > 6* 9 5* 9  CREATININE 0.51   < > 0.46   < > 0.36* 0.47 0.38* 0.55  CALCIUM  --    < > 7.5*   < > 8.8* 8.8* 8.0* 8.0*  MG 1.9  --  2.1   < > 1.9  --  1.8 2.3  PHOS 1.7*  --  1.5*  --   --   --   --   --    < > = values in this interval not displayed.   Liver Function Tests: Recent Labs    09/17/18 1934 09/19/18 0427 09/24/18 0626 09/27/18 0401  AST 48*  --  25 20  ALT 29  --  20 16  ALKPHOS 168*  --  86 78  BILITOT 1.3*  --  0.2* 0.4  PROT 8.0  --  6.5 6.4*  ALBUMIN 2.7* 2.3* 2.3* 2.3*   No results for input(s): LIPASE, AMYLASE in the last 8760 hours. Recent Labs    09/18/18 1010  AMMONIA 24   CBC: Recent Labs    09/25/18 0340 09/26/18 0341 09/28/18 0401  WBC 17.3* 15.2* 16.1*  NEUTROABS 11.7* 9.8* 12.0*  HGB 10.0* 10.0* 9.8*  HCT 32.9* 32.4* 32.8*  MCV 89.6 91.3 90.6  PLT 407* 393 410*   Cardiac Enzymes: No results for input(s): CKTOTAL, CKMB, CKMBINDEX, TROPONINI in the last 8760 hours. BNP: Invalid input(s): POCBNP Lab Results  Component Value Date   HGBA1C 6.6 (H) 09/26/2018   Lab Results  Component Value Date   TSH 4.29 12/29/2017   No results found for: VITAMINB12 No results found for: FOLATE Lab Results  Component Value Date   FERRITIN 549 (H) 09/24/2018    Imaging and Procedures obtained prior to SNF admission: Dg Chest Port 1 View  Result Date: 09/17/2018 CLINICAL DATA:  85 y/o  F; cough, shortness of breath, fever. EXAM: PORTABLE CHEST 1 VIEW COMPARISON:  06/17/2018 chest radiograph FINDINGS: Stable cardiac silhouette given projection and technique. Aortic atherosclerosis with calcification. Left pleural effusion and basilar consolidation. Hazy opacities at periphery of right lower lung. No pneumothorax. No acute osseous abnormality is evident. IMPRESSION: Left pleural effusion and basilar consolidation, probably pneumonia. Electronically Signed   By: Kristine Garbe M.D.   On: 09/17/2018 20:14    Assessment/Plan HCAP  (healthcare-associated pneumonia) - Plan:  Still has Leucocytosis But no fever or Cough Continue to monitor  Pleural effusion on left - Plan:  S/P Thoracocentesis Cultures Negative for now  Esophageal stricture - Plan:  S/P Dilatation Started on Protonix  Follow up with GI PRN GERD EGD positive for Reflux Will start on Protonix And hold Fosamax Anemia Due to Chronic Disease Repeat CBC  Late onset Alzheimer's disease  - Plan: Continue on Aricept and Namenda Has progressive Disease Continue Supportive Care  FTT (failure to thrive) in adult - Plan:  Has lost almost 10 lbs  Was started on Remeron in hospital  Age-related osteoporosis without current pathological fracture -  Plan:  Her T Score is -2.6 in 2018 Discontinue Fosamax for 3 weeks due to Dysphagia and Reflux Restart Later  Hyperlipidemia Patient would benefit from Statin but she is listed as allergic to them  H/o TIA On aspirin Will monitor BP Cannot use Statin due to allergy listed   Family/ staff Communication:   Labs/tests ordered: Follow up of BMP and CBC  Total time spent in this patient care encounter was 45 _  minutes; greater than 50% of the visit spent counseling  staff, reviewing records , Labs and coordinating care for problems addressed at this encounter.

## 2018-10-07 ENCOUNTER — Non-Acute Institutional Stay (SKILLED_NURSING_FACILITY): Payer: Medicare Other | Admitting: Internal Medicine

## 2018-10-07 ENCOUNTER — Encounter: Payer: Self-pay | Admitting: Internal Medicine

## 2018-10-07 DIAGNOSIS — F0280 Dementia in other diseases classified elsewhere without behavioral disturbance: Secondary | ICD-10-CM | POA: Diagnosis not present

## 2018-10-07 DIAGNOSIS — G301 Alzheimer's disease with late onset: Secondary | ICD-10-CM

## 2018-10-07 DIAGNOSIS — R21 Rash and other nonspecific skin eruption: Secondary | ICD-10-CM | POA: Diagnosis not present

## 2018-10-07 NOTE — Progress Notes (Signed)
Location:  [facility] Room Number: [place] of Service:  ALF [phone]) Provider:  Dr. Clydene Fake, MD  Patient Care Team: [name], MD as PCP - General (Internal Medicine) Lindwood Coke, MD as Consulting Physician (Dermatology) Christophe Louis, MD as Consulting Physician (Obstetrics and Gynecology) Latanya Maudlin, MD as Consulting Physician (Orthopedic Surgery) Garlan Fair, MD as Consulting Physician (Gastroenterology) Barnett Abu., MD as Consulting Physician (Cardiology) Neldon Mc Donnamarie Poag, MD as Consulting Physician (Allergy and Immunology) Rexene Alberts, MD as Consulting Physician (Cardiothoracic Surgery) Melida Quitter, MD as Consulting Physician (Otolaryngology) Ngetich, Nelda Bucks, NP as Nurse Practitioner (Family Medicine)  Extended Emergency Contact Information Primary Emergency Contact: Kathie Rhodes of [place] Phone: [phone] Mobile Phone: [phone] Relation: Legal Guardian Secondary Emergency Contact: Leana Gamer [place] of [place] Phone: [phone] Relation: Uncle  Code Status:  FULL Goals of care: Advanced Directive information Advanced Directives 10/07/2018  Does Patient Have a Medical Advance Directive? Yes  Type of Advance Directive [place]  Does patient want to make changes to medical advance directive? No - Patient declined  Copy of [place] in Chart? Yes - validated most recent copy scanned in chart (See row information)  Pre-existing out of facility DNR order (yellow form or pink MOST form) -     Chief Complaint  Patient presents with   Acute Visit    Rectal Bleeding     HPI:  Pt is a 85 y.o. female seen today for an acute visit for ?rectal Bleeding Patient was recently Readmitted to SNF for therapy and Long term care.  She was in the Hospital from 6/18-6/29 for HCAP and Pleural Effusion Also underwent EGD for  Esophageal Structure Patient has h/o Hyperlipidemia, Osteoporosis, Allergic Rhinitis and Dementia Alzheimer She stays in SNF for Long term care due to her due to Progressive dementia  She is unable to give much history. But per nurses they noticed some Bright red blood streaks in her Bowel movement Patient denies any Abdominal Pain. Her appetite has picked and is good. No Constipation. No Nausea or Voimiting   Past Medical History:  Diagnosis Date   Abnormal CT scan, chest September 2011   Scar tissue   Allergic rhinitis    Alzheimer's dementia ([place])    Asthma    Diabetes mellitus    borderline and under control-diet ,exercise   Esophageal stricture    Family history of colon cancer    Mother   GERD (gastroesophageal reflux disease)    Hiatal hernia    HLD (hyperlipidemia)    Hypertension    Osteopenia    Pneumonia 12-15 yrs.ago   yrs. ago   TIA (transient ischemic attack)    Vitamin D deficiency    Past Surgical History:  Procedure Laterality Date   ABDOMINAL HYSTERECTOMY  1980    BALLOON DILATION N/A 09/27/2018   Procedure: BALLOON DILATION;  Surgeon: Jackquline Denmark, MD;  Location: [facility];  Service: Endoscopy;  Laterality: N/A;   BIOPSY  09/27/2018   Procedure: BIOPSY;  Surgeon: Jackquline Denmark, MD;  Location: [facility];  Service: Endoscopy;;   CATARACT EXTRACTION Left 2014   ESOPHAGOGASTRODUODENOSCOPY (EGD) WITH PROPOFOL N/A 09/27/2018   Procedure: ESOPHAGOGASTRODUODENOSCOPY (EGD) WITH PROPOFOL;  Surgeon: Jackquline Denmark, MD;  Location: [facility];  Service: Endoscopy;  Laterality: N/A;   SHOULDER OPEN ROTATOR CUFF REPAIR  05/22/2011   Procedure: ROTATOR CUFF REPAIR SHOULDER OPEN;  Surgeon: Jori Moll  Fransico Setters, MD;  Location: [facility];  Service: Orthopedics;  Laterality: Left;   THORACOTOMY / DECORTICATION PARIETAL PLEURA Right 204   empyema   TONSILLECTOMY      Allergies  Allergen Reactions   Aricept [Donepezil Hcl] Diarrhea   Crestor  [Rosuvastatin Calcium]     Myalgia   Lipitor [Atorvastatin Calcium]    Lovastatin     Myalgia   Micardis [Telmisartan]     Fatigue   Sertraline Diarrhea   Welchol [Colesevelam Hcl]     Constipation   Zetia [Ezetimibe]     Myalgia   Zocor [Simvastatin]     Myalgia    Outpatient Encounter Medications as of 10/07/2018  Medication Sig   acetaminophen (TYLENOL) 325 MG tablet Take 650 mg by mouth every 6 (six) hours as needed for headache.   aspirin EC 81 MG tablet Take 81 mg by mouth daily.   Calcium Carbonate-Vitamin D (LIQUID CALCIUM/VITAMIN D) 600-200 MG-UNIT CAPS Take 1 tablet by mouth.   lactose free nutrition (BOOST) LIQD Take 237 mLs by mouth 2 (two) times daily between meals.   memantine (NAMENDA XR) 28 MG CP24 24 hr capsule TAKE ONE CAPSULE ONCE A DAY TO PRESERVE MEMORY.   mirtazapine (REMERON SOL-TAB) 15 MG disintegrating tablet Take 1 tablet (15 mg total) by mouth at bedtime for 30 days.   Multiple Vitamin (MULTIVITAMIN) LIQD Take 15 mLs by mouth daily for 30 days.   Omega-3 Fatty Acids (FISH OIL OMEGA-3) 1000 MG CAPS Take 1 capsule by mouth daily.   rivastigmine (EXELON) 9.5 mg/24hr Apply fresh patch daily and remove old patch to help preserve memory   zinc oxide 20 % ointment Apply 1 application topically as needed for irritation. Apply to buttocks/peri topical as needed   [DISCONTINUED] alendronate (FOSAMAX) 70 MG tablet TAKE 1 TAB ONCE A WEEK, AT LEAST 30 MIN BEFORE 1ST FOOD.DO NOT LIE DOWN FOR 30 MIN AFTER TAKING. (Patient taking differently: Take 70 mg by mouth once a week. Take 70mg  30 mins before 1st meal of the day. Do not lie down for 30 mins after taking.)   [DISCONTINUED] cetirizine (ZYRTEC) 10 MG tablet Take 10 mg by mouth at bedtime.    No facility-administered encounter medications on file as of 10/07/2018.     Review of Systems  Unable to perform ROS: Dementia    Immunization History  Administered Date(s) Administered   Influenza,inj,Quad  PF,6+ Mos 12/29/2017   Influenza-Unspecified 12/23/2013, 12/01/2014, 01/11/2016   Pneumococcal Conjugate-13 11/22/2013   Pneumococcal Polysaccharide-23 07/01/2006   Tdap 12/17/2011   Zoster 12/25/2005   Pertinent  Health Maintenance Due  Topic Date Due   FOOT EXAM  12/06/1942   OPHTHALMOLOGY EXAM  12/06/1942   URINE MICROALBUMIN  02/11/2017   INFLUENZA VACCINE  10/31/2018   HEMOGLOBIN A1C  03/28/2019   DEXA SCAN  Completed   PNA vac Low Risk Adult  Completed   Fall Risk  06/17/2018 04/22/2018 01/22/2017 07/23/2016 04/25/2015  Falls in the past year? - 0 No No No  Number falls in past yr: - 0 - - -  Injury with Fall? - 0 - - -  Risk for fall due to : Impaired vision - - - -   Functional Status Survey:    Vitals:   10/07/18 1112  BP: (!) 117/56  Pulse: 76  Resp: 20  Temp: (!) 97 F (36.1 C)  TempSrc: Oral  SpO2: 98%  Weight: 119 lb 14.4 oz (54.4 kg)  Height: 5' (1.524 m)  Body mass index is 23.42 kg/m. Physical Exam  Constitutional: Alert. Well-developed and well-nourished.  HENT:  Head: Normocephalic.  Mouth/Throat: Oropharynx is clear and moist.  Eyes: Pupils are equal, round, and reactive to light.  Neck: Neck supple.  Cardiovascular: Normal rate and normal heart sounds.  No murmur heard. Pulmonary/Chest: Effort normal and breath sounds normal. No respiratory distress. No wheezes. She has no rales.  Abdominal: Soft. Bowel sounds are normal. No distension. There is no tenderness. There is no rebound. She has Rash in Her Groin and Perianal area. No hemorrhoids Musculoskeletal: No edema.  Lymphadenopathy: none Neurological: Alert and oriented to person, place, and time.  Skin: Skin is warm and dry.  Psychiatric: Normal mood and affect. Behavior is normal. Thought content normal.    Labs reviewed: Recent Labs    09/18/18 0120  09/19/18 0427  09/25/18 0340 09/26/18 0341 09/27/18 0401 09/28/18 0401  NA  --    < > 138   < > 139 140 138 138  K  --     < > 3.6   < > 3.8 3.9 3.7 3.3*  CL  --    < > 105   < > 107 105 105 102  CO2  --    < > 25   < > 24 23 25 27   GLUCOSE  --    < > 128*   < > 140* 128* 113* 157*  BUN  --    < > 5*   < > 6* 9 5* 9  CREATININE 0.51   < > 0.46   < > 0.36* 0.47 0.38* 0.55  CALCIUM  --    < > 7.5*   < > 8.8* 8.8* 8.0* 8.0*  MG 1.9  --  2.1   < > 1.9  --  1.8 2.3  PHOS 1.7*  --  1.5*  --   --   --   --   --    < > = values in this interval not displayed.   Recent Labs    09/17/18 1934 09/19/18 0427 09/24/18 0626 09/27/18 0401  AST 48*  --  25 20  ALT 29  --  20 16  ALKPHOS 168*  --  86 78  BILITOT 1.3*  --  0.2* 0.4  PROT 8.0  --  6.5 6.4*  ALBUMIN 2.7* 2.3* 2.3* 2.3*   Recent Labs    09/25/18 0340 09/26/18 0341 09/28/18 0401  WBC 17.3* 15.2* 16.1*  NEUTROABS 11.7* 9.8* 12.0*  HGB 10.0* 10.0* 9.8*  HCT 32.9* 32.4* 32.8*  MCV 89.6 91.3 90.6  PLT 407* 393 410*   Lab Results  Component Value Date   TSH 4.29 12/29/2017   Lab Results  Component Value Date   HGBA1C 6.6 (H) 09/26/2018   Lab Results  Component Value Date   CHOL 275 (H) 04/06/2018   HDL 52 04/06/2018   LDLCALC 190 (H) 04/06/2018   TRIG 161 (H) 04/06/2018   CHOLHDL 5.3 (H) 04/06/2018    Significant Diagnostic Results in last 30 days:  Dg Chest 1 View  Result Date: 09/25/2018 CLINICAL DATA:  Left pleural effusion. Status post left thoracentesis. EXAM: CHEST  1 VIEW COMPARISON:  Chest x-ray and CT scan dated 09/24/2018 FINDINGS: The moderate left effusion is diminished after thoracentesis. No pneumothorax. Slightly improved aeration at the left lung base. Right lung is clear. Heart size and vascularity are normal. No acute bone abnormality. Chronic rotator cuff disease at the right shoulder. IMPRESSION:  1. No pneumothorax after left thoracentesis. 2. Diminished left effusion with slightly improved aeration at the left lung base. Electronically Signed   By: Lorriane Shire M.D.   On: 09/25/2018 17:02   Dg Abd 1 View  Result  Date: 09/24/2018 CLINICAL DATA:  Fever cough and short of breath EXAM: ABDOMEN - 1 VIEW COMPARISON:  None. FINDINGS: Nonobstructed bowel gas pattern with contrast material in the colon and rectum. Airspace disease at the left lung base with suspected effusion. Catheter inferior to the pubic bones. IMPRESSION: Nonobstructed bowel-gas pattern with contrast material in the colon and rectum. Electronically Signed   By: Donavan Foil M.D.   On: 09/24/2018 17:16   Ct Chest Wo Contrast  Result Date: 09/24/2018 CLINICAL DATA:  Pleural effusion. EXAM: CT CHEST WITHOUT CONTRAST TECHNIQUE: Multidetector CT imaging of the chest was performed following the standard protocol without IV contrast. COMPARISON:  None. FINDINGS: Cardiovascular: No significant vascular findings. Normal heart size. No pericardial effusion. Coronary artery calcifications are noted. Aortic calcifications are noted. Mediastinum/Nodes: No enlarged mediastinal or axillary lymph nodes. The thyroid gland is unremarkable. The esophagus is fluid-filled to the level of the upper thorax. Lungs/Pleura: There is a large left-sided pleural effusion. There is near complete collapse of the left lower lobe. Multiple calcifications are noted within the collapsed left lower lobe. There is a trace right-sided pleural effusion. The trachea is unremarkable. Upper Abdomen: No acute abnormality. Musculoskeletal: No chest wall mass or suspicious bone lesions identified. IMPRESSION: 1. Evaluation is limited by motion artifact and lack of IV contrast. 2. Large left-sided pleural effusion with near complete collapse of the left lower lobe. 3. Trace right-sided pleural effusion. 4. Fluid-filled esophagus to the level of the upper thorax, which places the patient at risk for aspiration. There are a few hyperdense foci within the collapsed left lower lobe which could represent aspirated contrast. Aortic Atherosclerosis (ICD10-I70.0). Electronically Signed   By: Constance Holster  M.D.   On: 09/24/2018 19:10   Dg Chest Port 1 View  Result Date: 09/24/2018 CLINICAL DATA:  Fever cough and short of breath EXAM: PORTABLE CHEST 1 VIEW COMPARISON:  09/17/2018, 06/17/2018, 03/29/2013 FINDINGS: Moderate left-sided pleural effusion. Dense airspace disease at the lingula and left base. Pleural and parenchymal scarring at the right base. Cardiomegaly. Aortic atherosclerosis. No pneumothorax. IMPRESSION: Moderate left pleural effusion with dense consolidation at the lingula and left base, atelectasis or pneumonia. Findings do not appear significantly changed as compared with 09/17/2018 Electronically Signed   By: Donavan Foil M.D.   On: 09/24/2018 17:18   Dg Chest Port 1 View  Result Date: 09/17/2018 CLINICAL DATA:  85 y/o  F; cough, shortness of breath, fever. EXAM: PORTABLE CHEST 1 VIEW COMPARISON:  06/17/2018 chest radiograph FINDINGS: Stable cardiac silhouette given projection and technique. Aortic atherosclerosis with calcification. Left pleural effusion and basilar consolidation. Hazy opacities at periphery of right lower lung. No pneumothorax. No acute osseous abnormality is evident. IMPRESSION: Left pleural effusion and basilar consolidation, probably pneumonia. Electronically Signed   By: Kristine Garbe M.D.   On: 09/17/2018 20:14   Dg Swallowing Func-speech Pathology  Result Date: 09/21/2018 Objective Swallowing Evaluation: Type of Study: MBS-Modified Barium Swallow Study  Patient Details Name: Grace Gilbert MRN: [id number] Date of Birth: 07/22/1932 Today's Date: 09/21/2018 Time: SLP Start Time (ACUTE ONLY): 1230 -SLP Stop Time (ACUTE ONLY): 1300 SLP Time Calculation (min) (ACUTE ONLY): 30 min Past Medical History: Past Medical History: Diagnosis Date  Abnormal CT scan, chest September 2011  Scar tissue  Allergic rhinitis   Alzheimer's dementia ([place])   Asthma   Diabetes mellitus   borderline and under control-diet ,exercise  Esophageal stricture   Family history of  colon cancer   Mother  GERD (gastroesophageal reflux disease)   Hiatal hernia   HLD (hyperlipidemia)   Hypertension   Osteopenia   Pneumonia 12-15 yrs.ago  yrs. ago  TIA (transient ischemic attack)   Vitamin D deficiency  Past Surgical History: Past Surgical History: Procedure Laterality Date  ABDOMINAL HYSTERECTOMY  1980   CATARACT EXTRACTION Left 2014  SHOULDER OPEN ROTATOR CUFF REPAIR  05/22/2011  Procedure: ROTATOR CUFF REPAIR SHOULDER OPEN;  Surgeon: Tobi Bastos, MD;  Location: [facility];  Service: Orthopedics;  Laterality: Left;  THORACOTOMY / DECORTICATION PARIETAL PLEURA Right 204  empyema  TONSILLECTOMY   HPI: [name] is an 85 y.o. female with a known history of Alzheimer's dementia, hypertension, hyperlipidemia, GERD, diabetes presents to the emergency department for evaluation of fever, cough, shortness of breath.  Patient was sent to the emergency department by her nursing home because she had developed symptoms of fever, shortness of breath and cough.  Chest x-ray showed pneumonia.  Subjective: Pt seen in radiology for MBS. Pt alert but confused (unaware of how she takes her medications, as her parents handle that for her) Assessment / Plan / Recommendation CHL IP CLINICAL IMPRESSIONS 09/21/2018 Clinical Impression Pt seen in radiology for MBS to objectively assess oral and pharyngeal swallow function and safety. ORAL: Pt exhibits adequate dentition. Flash penetration was seen on thin liquids via cup and straw. Compensatory positions were not attempted, as they would likely be ineffective due to pt cognitive deficits. Oral prep and coordination were noted to be decreased, with extended oral transit and trace lingual residue. Pt benefited from cued dry swallow to clear oral residue. When given barium tablet, pt chewed it up and swallowed with boluses of puree. PHARYNGEAL: Pt exhibits swallow reflex at the level of the vallecular sinus on thin liquid, puree and solid textures, and at the  pyriform sinus on nectar thick liquid. Slight vallecular residue noted due to decreased tongue base retraction, which cleared effectively with dry swallow. Given dis-coordinated oral prep and extended oral transit, pt is at increased aspiration risk with fatigue. Will downgrade diet to dys 2 for energy conservation. Thin liquids recommended, via cup or straw, crushed meds. Safe swallow precautions sent with transport to pt room. SLP will follow for diet tolerance and education.  SLP Visit Diagnosis Dysphagia, oropharyngeal phase (R13.12)     Impact on safety and function Mild aspiration risk   CHL IP TREATMENT RECOMMENDATION 09/21/2018 Treatment Recommendations Therapy as outlined in treatment plan below   Prognosis 09/21/2018 Prognosis for Safe Diet Advancement Good Barriers to Reach Goals Cognitive deficits Barriers/Prognosis Comment -- CHL IP DIET RECOMMENDATION 09/21/2018 SLP Diet Recommendations Dysphagia 2 (Fine chop) solids;Thin liquid Liquid Administration via Cup;Straw Medication Administration Crushed with puree Compensations Minimize environmental distractions;Slow rate;Small sips/bites;Multiple dry swallows after each bite/sip Postural Changes Remain semi-upright after after feeds/meals (Comment);Seated upright at 90 degrees   CHL IP OTHER RECOMMENDATIONS 09/21/2018 Recommended Consults -- Oral Care Recommendations Oral care QID;Staff/trained caregiver to provide oral care Other Recommendations --   CHL IP FOLLOW UP RECOMMENDATIONS 09/21/2018 Follow up Recommendations 24 hour supervision/assistance   CHL IP FREQUENCY AND DURATION 09/21/2018 Speech Therapy Frequency (ACUTE ONLY) min 1 x/week Treatment Duration 1 week;2 weeks      CHL IP ORAL PHASE 09/21/2018 Oral Phase Impaired Oral - Pudding  Teaspoon -- Oral - Pudding Cup -- Oral - Honey Teaspoon -- Oral - Honey Cup -- Oral - Nectar Teaspoon Decreased bolus cohesion Oral - Nectar Cup -- Oral - Nectar Straw -- Oral - Thin Teaspoon -- Oral - Thin Cup Decreased  bolus cohesion Oral - Thin Straw -- Oral - Puree Decreased bolus cohesion Oral - Mech Soft -- Oral - Regular Delayed oral transit;Decreased bolus cohesion Oral - Multi-Consistency -- Oral - Pill Decreased bolus cohesion;Delayed oral transit Oral Phase - Comment --  CHL IP PHARYNGEAL PHASE 09/21/2018 Pharyngeal Phase Impaired Pharyngeal- Pudding Teaspoon -- Pharyngeal -- Pharyngeal- Pudding Cup -- Pharyngeal -- Pharyngeal- Honey Teaspoon -- Pharyngeal -- Pharyngeal- Honey Cup -- Pharyngeal -- Pharyngeal- Nectar Teaspoon Delayed swallow initiation-vallecula;Pharyngeal residue - valleculae;Reduced tongue base retraction Pharyngeal -- Pharyngeal- Nectar Cup -- Pharyngeal -- Pharyngeal- Nectar Straw -- Pharyngeal -- Pharyngeal- Thin Teaspoon -- Pharyngeal -- Pharyngeal- Thin Cup Penetration/Aspiration during swallow;Reduced tongue base retraction;Pharyngeal residue - valleculae Pharyngeal Material enters airway, remains ABOVE vocal cords then ejected out Pharyngeal- Thin Straw -- Pharyngeal -- Pharyngeal- Puree Pharyngeal residue - valleculae;Reduced tongue base retraction Pharyngeal -- Pharyngeal- Mechanical Soft -- Pharyngeal -- Pharyngeal- Regular Reduced tongue base retraction;Pharyngeal residue - valleculae Pharyngeal -- Pharyngeal- Multi-consistency -- Pharyngeal -- Pharyngeal- Pill Other (Comment) Pharyngeal -- Pharyngeal Comment --  CHL IP CERVICAL ESOPHAGEAL PHASE 09/21/2018 Cervical Esophageal Phase WFL Pudding Teaspoon -- Pudding Cup -- Honey Teaspoon -- Honey Cup -- Nectar Teaspoon -- Nectar Cup -- Nectar Straw -- Thin Teaspoon -- Thin Cup -- Thin Straw -- Puree -- Mechanical Soft -- Regular -- Multi-consistency -- Pill -- Cervical Esophageal Comment -- Celia B. Quentin Ore [facility], CCC-SLP Speech Language Pathologist [phone] Shonna Chock 09/21/2018, 1:23 PM              Dg Esophagus W Single Cm (sol Or Thin Ba)  Result Date: 09/25/2018 CLINICAL DATA:  Dysphagia EXAM: ESOPHOGRAM/BARIUM SWALLOW TECHNIQUE:  Single contrast examination was performed using  thin barium. FLUOROSCOPY TIME:  Fluoroscopy Time:  1 minutes 30 seconds Radiation Exposure Index (if provided by the fluoroscopic device): 33.6 mGy Number of Acquired Spot Images: 7 COMPARISON:  None. FINDINGS: Limited single contrast evaluation in the prone position. Mild esophageal dysmotility. No fixed esophageal narrowing or stricture involving the upper/mid esophagus. However, at a minimum, there is lower esophageal dysfunction. On initial swallows, contrast passed into the stomach, which is notable for a small hiatal hernia. However, on subsequent swallows, further contrast did not pass into the stomach in remained in the mid/distal esophagus, despite a 5 minutes delay. The possibility of a malignant stricture at the GE junction is not excluded. IMPRESSION: Limited single contrast evaluation in the prone position. Overall appearance favors esophageal dysmotility with achalasia. However, a malignant stricture at the GE junction is not excluded. Endoscopic correlation is suggested. Electronically Signed   By: Julian Hy M.D.   On: 09/25/2018 17:34   US Thoracentesis Asp Pleural Space W/img Guide  Result Date: 09/25/2018 INDICATION: Shortness of breath with left pleural effusion. Request for diagnostic and therapeutic thoracentesis. EXAM: ULTRASOUND GUIDED LEFT THORACENTESIS MEDICATIONS: 1% lidocaine 10 mL COMPLICATIONS: None immediate. PROCEDURE: An ultrasound guided thoracentesis was thoroughly discussed with the patient and questions answered. The benefits, risks, alternatives and complications were also discussed. The patient understands and wishes to proceed with the procedure. Written consent was obtained. Ultrasound was performed to localize and mark an adequate pocket of fluid in the left chest. The area was then prepped and draped in the normal  sterile fashion. 1% Lidocaine was used for local anesthesia. Under ultrasound guidance a 6 Fr  Safe-T-Centesis catheter was introduced. Thoracentesis was performed. The catheter was removed and a dressing applied. FINDINGS: A total of approximately 200 mL of clear yellow fluid was removed. Samples were sent to the laboratory as requested by the clinical team. IMPRESSION: Successful ultrasound guided left thoracentesis yielding 200 mL of pleural fluid. No pneumothorax on post-procedure chest x-ray. Read by: Gareth Eagle PA-C Electronically Signed   By: Jerilynn Mages.  Shick M.D.   On: 09/25/2018 17:02    Assessment/Plan ? Blood streak in Stools Possible due to her Skin excoriation  Will start her on Diflucan 100mg  QD for 5 days Start on  on Nystatin  She has CBC due tomorrow Will continue to monitor. D/W the Nurse   Family/ staff Communication:   Labs/tests ordered:  CBC  Total time spent in this patient care encounter was  25_  minutes; greater than 50% of the visit spent counseling  staff, reviewing records , Labs and coordinating care for problems addressed at this encounter.

## 2018-10-08 LAB — CBC AND DIFFERENTIAL
HCT: 28 — AB (ref 36–46)
Hemoglobin: 9.1 — AB (ref 12.0–16.0)
Platelets: 414 — AB (ref 150–399)
WBC: 9.1

## 2018-10-08 LAB — BASIC METABOLIC PANEL
BUN: 9 (ref 4–21)
Creatinine: 0.5 (ref 0.5–1.1)
Glucose: 109
Potassium: 4.1 (ref 3.4–5.3)
Sodium: 142 (ref 137–147)

## 2018-11-03 ENCOUNTER — Non-Acute Institutional Stay (SKILLED_NURSING_FACILITY): Payer: Medicare Other | Admitting: Nurse Practitioner

## 2018-11-03 ENCOUNTER — Other Ambulatory Visit: Payer: Self-pay | Admitting: *Deleted

## 2018-11-03 ENCOUNTER — Encounter: Payer: Self-pay | Admitting: Nurse Practitioner

## 2018-11-03 DIAGNOSIS — D649 Anemia, unspecified: Secondary | ICD-10-CM | POA: Diagnosis not present

## 2018-11-03 DIAGNOSIS — R609 Edema, unspecified: Secondary | ICD-10-CM | POA: Insufficient documentation

## 2018-11-03 DIAGNOSIS — R627 Adult failure to thrive: Secondary | ICD-10-CM

## 2018-11-03 DIAGNOSIS — F0150 Vascular dementia without behavioral disturbance: Secondary | ICD-10-CM | POA: Diagnosis not present

## 2018-11-03 LAB — CALCIUM
Calcium: 8.5
Carbon Dioxide, Total: 30
Chloride: 103

## 2018-11-03 NOTE — Assessment & Plan Note (Signed)
Stable, continue Mirtazapine 7.5mg qd.  

## 2018-11-03 NOTE — Assessment & Plan Note (Signed)
Hgb 9.1 10/08/18, 9.8 09/28/18, no active bleeding, update CBC

## 2018-11-03 NOTE — Progress Notes (Signed)
Location:  [facility] Room Number: 9 Place of Service:  SNF [phone]) Provider:  Hue Steveson, Lennie Odor   NP  [name], MD  Patient Care Team: [name], MD as PCP - General (Internal Medicine) Lindwood Coke, MD as Consulting Physician (Dermatology) Christophe Louis, MD as Consulting Physician (Obstetrics and Gynecology) Latanya Maudlin, MD as Consulting Physician (Orthopedic Surgery) Garlan Fair, MD as Consulting Physician (Gastroenterology) Barnett Abu., MD as Consulting Physician (Cardiology) Neldon Mc Donnamarie Poag, MD as Consulting Physician (Allergy and Immunology) Rexene Alberts, MD as Consulting Physician (Cardiothoracic Surgery) Melida Quitter, MD as Consulting Physician (Otolaryngology) Ngetich, Nelda Bucks, NP as Nurse Practitioner (Family Medicine)  Extended Emergency Contact Information Primary Emergency Contact: Kathie Rhodes of [place] Phone: [phone] Mobile Phone: [phone] Relation: Legal Guardian Secondary Emergency Contact: Leana Gamer [place] of [place] Phone: [phone] Relation: Uncle  Code Status:  DNR Goals of care: Advanced Directive information Advanced Directives 10/07/2018  Does Patient Have a Medical Advance Directive? Yes  Type of Advance Directive [place]  Does patient want to make changes to medical advance directive? No - Patient declined  Copy of [place] in Chart? Yes - validated most recent copy scanned in chart (See row information)  Pre-existing out of facility DNR order (yellow form or pink MOST form) -     Chief Complaint  Patient presents with  . Medical Management of Chronic Issues    HPI:  Pt is a 85 y.o. female seen today for medical management of chronic diseases.    The patient resides in SNF [facility] for safety and care assistance. Hx of anemia, Hgb 9.1 10/08/18, 9.8 09/28/18, no active bleeding.Hx of dementia, taking  Rivastigmine 9.5mg /24hrs patch, Memantine 28mg  qd. Her mood is stable, on Mirtazapine 7.5mg  qd    Past Medical History:  Diagnosis Date  . Abnormal CT scan, chest September 2011   Scar tissue  . Allergic rhinitis   . Alzheimer's dementia ([place])   . Asthma   . Diabetes mellitus    borderline and under control-diet ,exercise  . Esophageal stricture   . Family history of colon cancer    Mother  . GERD (gastroesophageal reflux disease)   . Hiatal hernia   . HLD (hyperlipidemia)   . Hypertension   . Osteopenia   . Pneumonia 12-15 yrs.ago   yrs. ago  . TIA (transient ischemic attack)   . Vitamin D deficiency    Past Surgical History:  Procedure Laterality Date  . ABDOMINAL HYSTERECTOMY  1980   . BALLOON DILATION N/A 09/27/2018   Procedure: BALLOON DILATION;  Surgeon: Jackquline Denmark, MD;  [facility];  Service: Endoscopy;  Laterality: N/A;  . BIOPSY  09/27/2018   Procedure: BIOPSY;  Surgeon: Jackquline Denmark, MD;  [facility];  Service: Endoscopy;;  . CATARACT EXTRACTION Left 2014  . ESOPHAGOGASTRODUODENOSCOPY (EGD) WITH PROPOFOL N/A 09/27/2018   Procedure: ESOPHAGOGASTRODUODENOSCOPY (EGD) WITH PROPOFOL;  Surgeon: Jackquline Denmark, MD;  [facility];  Service: Endoscopy;  Laterality: N/A;  . SHOULDER OPEN ROTATOR CUFF REPAIR  05/22/2011   Procedure: ROTATOR CUFF REPAIR SHOULDER OPEN;  Surgeon: Tobi Bastos, MD;  [facility];  Service: Orthopedics;  Laterality: Left;  . THORACOTOMY / DECORTICATION PARIETAL PLEURA Right 204   empyema  . TONSILLECTOMY      Allergies  Allergen Reactions  . Aricept [Donepezil Hcl] Diarrhea  . Crestor [Rosuvastatin Calcium]     Myalgia  .  Lipitor [Atorvastatin Calcium]   . Lovastatin     Myalgia  . Micardis [Telmisartan]     Fatigue  . Sertraline Diarrhea  . Welchol [Colesevelam Hcl]     Constipation  . Zetia [Ezetimibe]     Myalgia  . Zocor [Simvastatin]     Myalgia    Outpatient Encounter Medications as  of 11/03/2018  Medication Sig  . acetaminophen (TYLENOL) 325 MG tablet Take 650 mg by mouth every 6 (six) hours as needed for headache.  Marland Kitchen aspirin EC 81 MG tablet Take 81 mg by mouth daily.  . Calcium Carbonate-Vitamin D (LIQUID CALCIUM/VITAMIN D) 600-200 MG-UNIT CAPS Take 1 tablet by mouth.  . lactose free nutrition (BOOST) LIQD Take 237 mLs by mouth 2 (two) times daily between meals.  . memantine (NAMENDA XR) 28 MG CP24 24 hr capsule TAKE ONE CAPSULE ONCE A DAY TO PRESERVE MEMORY.  . mirtazapine (REMERON) 7.5 MG tablet Take 7.5 mg by mouth daily.  . Omega-3 Fatty Acids (FISH OIL OMEGA-3) 1000 MG CAPS Take 1 capsule by mouth daily.  . rivastigmine (EXELON) 9.5 mg/24hr Apply fresh patch daily and remove old patch to help preserve memory  . zinc oxide 20 % ointment Apply 1 application topically as needed for irritation. Apply to buttocks/peri topical as needed  . [DISCONTINUED] mirtazapine (REMERON SOL-TAB) 15 MG disintegrating tablet Take 1 tablet (15 mg total) by mouth at bedtime for 30 days.   No facility-administered encounter medications on file as of 11/03/2018.    ROS was provided with assistance of staff.  Review of Systems  Constitutional: Negative for activity change, appetite change, chills, diaphoresis, fatigue, fever and unexpected weight change.  HENT: Positive for hearing loss. Negative for congestion and voice change.   Respiratory: Negative for cough, shortness of breath and wheezing.   Cardiovascular: Positive for leg swelling. Negative for chest pain and palpitations.  Gastrointestinal: Negative for abdominal distention, abdominal pain, constipation, diarrhea, nausea and vomiting.  Genitourinary: Negative for difficulty urinating, dysuria and urgency.  Musculoskeletal: Positive for gait problem.  Neurological: Negative for dizziness, speech difficulty, weakness and headaches.       Dementia  Psychiatric/Behavioral: Negative for agitation, behavioral problems, hallucinations  and sleep disturbance. The patient is not nervous/anxious.     Immunization History  Administered Date(s) Administered  . Influenza,inj,Quad PF,6+ Mos 12/29/2017  . Influenza-Unspecified 12/23/2013, 12/01/2014, 01/11/2016  . Pneumococcal Conjugate-13 11/22/2013  . Pneumococcal Polysaccharide-23 07/01/2006  . Tdap 12/17/2011  . Zoster 12/25/2005   Pertinent  Health Maintenance Due  Topic Date Due  . FOOT EXAM  12/06/1942  . OPHTHALMOLOGY EXAM  12/06/1942  . URINE MICROALBUMIN  02/11/2017  . INFLUENZA VACCINE  10/31/2018  . HEMOGLOBIN A1C  03/28/2019  . DEXA SCAN  Completed  . PNA vac Low Risk Adult  Completed   Fall Risk  06/17/2018 04/22/2018 01/22/2017 07/23/2016 04/25/2015  Falls in the past year? - 0 No No No  Number falls in past yr: - 0 - - -  Injury with Fall? - 0 - - -  Risk for fall due to : Impaired vision - - - -   Functional Status Survey:    Vitals:   11/03/18 0923  BP: (!) 148/80  Pulse: 89  Resp: 19  Temp: (!) 97.3 F (36.3 C)  SpO2: 94%  Weight: 128 lb (58.1 kg)   Body mass index is 25 kg/m. Physical Exam Vitals signs and nursing note reviewed.  Constitutional:      General: She is not  in acute distress.    Appearance: Normal appearance. She is normal weight. She is not ill-appearing, toxic-appearing or diaphoretic.  HENT:     Head: Normocephalic and atraumatic.     Nose: Nose normal.     Mouth/Throat:     Mouth: Mucous membranes are moist.  Eyes:     Extraocular Movements: Extraocular movements intact.     Conjunctiva/sclera: Conjunctivae normal.     Pupils: Pupils are equal, round, and reactive to light.  Neck:     Musculoskeletal: Normal range of motion and neck supple.  Cardiovascular:     Rate and Rhythm: Normal rate and regular rhythm.     Heart sounds: No murmur.  Pulmonary:     Effort: Pulmonary effort is normal.     Breath sounds: No wheezing or rales.  Abdominal:     General: Bowel sounds are normal.     Palpations: Abdomen is  soft.     Tenderness: There is no abdominal tenderness. There is no right CVA tenderness, left CVA tenderness, guarding or rebound.  Musculoskeletal:     Right lower leg: Edema present.     Left lower leg: Edema present.     Comments: 1+ edema BLE  Skin:    General: Skin is warm and dry.  Neurological:     General: No focal deficit present.     Mental Status: She is alert. Mental status is at baseline.     Cranial Nerves: No cranial nerve deficit.     Motor: No weakness.     Coordination: Coordination normal.     Gait: Gait abnormal.     Comments: Oriented to self  Psychiatric:        Mood and Affect: Mood normal.        Behavior: Behavior normal.     Labs reviewed: Recent Labs    09/18/18 0120  09/19/18 0427  09/25/18 0340 09/26/18 0341 09/27/18 0401 09/28/18 0401 10/08/18  NA  --    < > 138   < > 139 140 138 138 142  K  --    < > 3.6   < > 3.8 3.9 3.7 3.3* 4.1  CL  --    < > 105   < > 107 105 105 102 103  CO2  --    < > 25   < > 24 23 25 27 30   GLUCOSE  --    < > 128*   < > 140* 128* 113* 157*  --   BUN  --    < > 5*   < > 6* 9 5* 9 9  CREATININE 0.51   < > 0.46   < > 0.36* 0.47 0.38* 0.55 0.5  CALCIUM  --    < > 7.5*   < > 8.8* 8.8* 8.0* 8.0* 8.5  MG 1.9  --  2.1   < > 1.9  --  1.8 2.3  --   PHOS 1.7*  --  1.5*  --   --   --   --   --   --    < > = values in this interval not displayed.   Recent Labs    09/17/18 1934 09/19/18 0427 09/24/18 0626 09/27/18 0401  AST 48*  --  25 20  ALT 29  --  20 16  ALKPHOS 168*  --  86 78  BILITOT 1.3*  --  0.2* 0.4  PROT 8.0  --  6.5 6.4*  ALBUMIN 2.7*  2.3* 2.3* 2.3*   Recent Labs    09/25/18 0340 09/26/18 0341 09/28/18 0401 10/08/18  WBC 17.3* 15.2* 16.1* 9.1  NEUTROABS 11.7* 9.8* 12.0*  --   HGB 10.0* 10.0* 9.8* 9.1*  HCT 32.9* 32.4* 32.8* 28*  MCV 89.6 91.3 90.6  --   PLT 407* 393 410* 414*   Lab Results  Component Value Date   TSH 4.29 12/29/2017   Lab Results  Component Value Date   HGBA1C 6.6 (H)  09/26/2018   Lab Results  Component Value Date   CHOL 275 (H) 04/06/2018   HDL 52 04/06/2018   LDLCALC 190 (H) 04/06/2018   TRIG 161 (H) 04/06/2018   CHOLHDL 5.3 (H) 04/06/2018    Significant Diagnostic Results in last 30 days:  No results found.  Assessment/Plan Normocytic anemia Hgb 9.1 10/08/18, 9.8 09/28/18, no active bleeding, update CBC  Vascular dementia without behavioral disturbance ([place]) Continue SNF [facility] for safety and care assistance, continue Rivastigmine, Memantine.   FTT (failure to thrive) in adult Stable, continue Mirtazapine 7.5mg  qd.   Edema Will monitor weight, edema     Family/ staff Communication: plan of care reviewed with the patient and charge nurse.   Labs/tests ordered:  none  Time spend 25 minutes.

## 2018-11-03 NOTE — Assessment & Plan Note (Signed)
Continue SNF [facility] for safety and care assistance, continue Rivastigmine, Memantine.

## 2018-11-03 NOTE — Assessment & Plan Note (Signed)
Will monitor weight, edema

## 2018-11-05 LAB — CBC AND DIFFERENTIAL
HCT: 32 — AB (ref 36–46)
Hemoglobin: 10.2 — AB (ref 12.0–16.0)
Platelets: 336 (ref 150–399)
WBC: 8

## 2018-11-30 ENCOUNTER — Other Ambulatory Visit: Payer: Self-pay | Admitting: *Deleted

## 2018-12-01 ENCOUNTER — Non-Acute Institutional Stay (SKILLED_NURSING_FACILITY): Payer: Medicare Other | Admitting: Nurse Practitioner

## 2018-12-01 ENCOUNTER — Encounter: Payer: Self-pay | Admitting: Nurse Practitioner

## 2018-12-01 DIAGNOSIS — I10 Essential (primary) hypertension: Secondary | ICD-10-CM | POA: Diagnosis not present

## 2018-12-01 DIAGNOSIS — R627 Adult failure to thrive: Secondary | ICD-10-CM

## 2018-12-01 DIAGNOSIS — F0150 Vascular dementia without behavioral disturbance: Secondary | ICD-10-CM

## 2018-12-01 DIAGNOSIS — K222 Esophageal obstruction: Secondary | ICD-10-CM | POA: Diagnosis not present

## 2018-12-01 NOTE — Assessment & Plan Note (Signed)
Continue Omeprazole 20mg qd.  

## 2018-12-01 NOTE — Assessment & Plan Note (Signed)
Continue SNF [facility] for safety, care assistance, continue Memantine 28mg  qd, Rivastigmine patch 9.5mg /24hr.

## 2018-12-01 NOTE — Assessment & Plan Note (Addendum)
Occasionally elevated Sbp in 160s, she denied HA, dizziness, change of vision, chest pain/pressure, or palpitation. Observe for now. VS q shift x 72 hours, log to provider.

## 2018-12-01 NOTE — Assessment & Plan Note (Signed)
Stable, continue Mirtazapine 7.5mg qd.  

## 2018-12-01 NOTE — Progress Notes (Signed)
Location:  [facility] Room Number: 9 Place of Service:  SNF [phone]) Provider: Nikkita Adeyemi, Lennie Odor  NP  [name], MD  Patient Care Team: [name], MD as PCP - General (Internal Medicine) Lindwood Coke, MD as Consulting Physician (Dermatology) Christophe Louis, MD as Consulting Physician (Obstetrics and Gynecology) Latanya Maudlin, MD as Consulting Physician (Orthopedic Surgery) Garlan Fair, MD as Consulting Physician (Gastroenterology) Barnett Abu., MD as Consulting Physician (Cardiology) Neldon Mc Donnamarie Poag, MD as Consulting Physician (Allergy and Immunology) Rexene Alberts, MD as Consulting Physician (Cardiothoracic Surgery) Melida Quitter, MD as Consulting Physician (Otolaryngology) Ngetich, Nelda Bucks, NP as Nurse Practitioner (Family Medicine)  Extended Emergency Contact Information Primary Emergency Contact: Kathie Rhodes of [place] Phone: [phone] Mobile Phone: [phone] Relation: Legal Guardian Secondary Emergency Contact: Leana Gamer [place] of [place] Phone: [phone] Relation: Uncle  Code Status:  DNR Goals of care: Advanced Directive information Advanced Directives 12/01/2018  Does Patient Have a Medical Advance Directive? Yes  Type of Advance Directive [employer]  Does patient want to make changes to medical advance directive? No - Patient declined  Copy of [place] in Chart? Yes - validated most recent copy scanned in chart (See row information)  Pre-existing out of facility DNR order (yellow form or pink MOST form) -     Chief Complaint  Patient presents with  . Medical Management of Chronic Issues    HPI:  Pt is a 85 y.o. female seen today for medical management of chronic diseases.    The patient resides in SNF [facility] for safety, care assistance, on Memantine 28mg  qd, Rivstigmine 9.5mg /24 for memory. Hx of GERD, stable on Omeprazole 20mg  qd. Her  mood is stable on Mirtazapine 7.5mg  qd. Occasionally elevated Sbp in 160s, she denied HA, dizziness, change of vision, chest pain/pressure, or palpitation.    Past Medical History:  Diagnosis Date  . Abnormal CT scan, chest September 2011   Scar tissue  . Allergic rhinitis   . Alzheimer's dementia ([place])   . Asthma   . Diabetes mellitus    borderline and under control-diet ,exercise  . Esophageal stricture   . Family history of colon cancer    Mother  . GERD (gastroesophageal reflux disease)   . Hiatal hernia   . HLD (hyperlipidemia)   . Hypertension   . Osteopenia   . Pneumonia 12-15 yrs.ago   yrs. ago  . TIA (transient ischemic attack)   . Vitamin D deficiency    Past Surgical History:  Procedure Laterality Date  . ABDOMINAL HYSTERECTOMY  1980   . BALLOON DILATION N/A 09/27/2018   Procedure: BALLOON DILATION;  Surgeon: Jackquline Denmark, MD;  [facility];  Service: Endoscopy;  Laterality: N/A;  . BIOPSY  09/27/2018   Procedure: BIOPSY;  Surgeon: Jackquline Denmark, MD;  [facility];  Service: Endoscopy;;  . CATARACT EXTRACTION Left 2014  . ESOPHAGOGASTRODUODENOSCOPY (EGD) WITH PROPOFOL N/A 09/27/2018   Procedure: ESOPHAGOGASTRODUODENOSCOPY (EGD) WITH PROPOFOL;  Surgeon: Jackquline Denmark, MD;  [facility];  Service: Endoscopy;  Laterality: N/A;  . SHOULDER OPEN ROTATOR CUFF REPAIR  05/22/2011   Procedure: ROTATOR CUFF REPAIR SHOULDER OPEN;  Surgeon: Tobi Bastos, MD;  [facility];  Service: Orthopedics;  Laterality: Left;  . THORACOTOMY / DECORTICATION PARIETAL PLEURA Right 204   empyema  . TONSILLECTOMY      Allergies  Allergen Reactions  . Aricept [Donepezil Hcl] Diarrhea  . Crestor [  Rosuvastatin Calcium]     Myalgia  . Lipitor [Atorvastatin Calcium]   . Lovastatin     Myalgia  . Micardis [Telmisartan]     Fatigue  . Sertraline Diarrhea  . Welchol [Colesevelam Hcl]     Constipation  . Zetia [Ezetimibe]     Myalgia  . Zocor  [Simvastatin]     Myalgia    Outpatient Encounter Medications as of 12/01/2018  Medication Sig  . acetaminophen (TYLENOL) 325 MG tablet Take 650 mg by mouth every 6 (six) hours as needed for headache.  Marland Kitchen aspirin EC 81 MG tablet Take 81 mg by mouth daily.  . Calcium Carbonate-Vitamin D (LIQUID CALCIUM/VITAMIN D) 600-200 MG-UNIT CAPS Take 1 tablet by mouth.  . cetirizine (ZYRTEC) 10 MG tablet Take 10 mg by mouth daily.  Marland Kitchen lactose free nutrition (BOOST) LIQD Take 237 mLs by mouth 2 (two) times daily between meals.  . memantine (NAMENDA XR) 28 MG CP24 24 hr capsule TAKE ONE CAPSULE ONCE A DAY TO PRESERVE MEMORY.  . mirtazapine (REMERON) 7.5 MG tablet Take 7.5 mg by mouth daily.  Marland Kitchen omeprazole (PRILOSEC) 20 MG capsule Take 20 mg by mouth daily.  . rivastigmine (EXELON) 9.5 mg/24hr Apply fresh patch daily and remove old patch to help preserve memory  . zinc oxide 20 % ointment Apply 1 application topically as needed for irritation. Apply to buttocks/peri topical as needed  . [DISCONTINUED] Omega-3 Fatty Acids (FISH OIL OMEGA-3) 1000 MG CAPS Take 1 capsule by mouth daily.   No facility-administered encounter medications on file as of 12/01/2018.    ROS was provided with assistance of staff.  Review of Systems  Constitutional: Negative for activity change, appetite change, chills, diaphoresis, fatigue, fever and unexpected weight change.  HENT: Positive for hearing loss. Negative for congestion and voice change.   Respiratory: Negative for cough, shortness of breath and wheezing.   Cardiovascular: Positive for leg swelling. Negative for chest pain and palpitations.  Gastrointestinal: Negative for abdominal distention, abdominal pain, constipation, diarrhea, nausea and vomiting.  Genitourinary: Negative for difficulty urinating, dysuria and urgency.  Musculoskeletal: Negative for gait problem.  Skin: Negative for color change and pallor.  Neurological: Negative for dizziness, speech difficulty,  weakness and headaches.       Dementia  Psychiatric/Behavioral: Negative for agitation, behavioral problems, hallucinations and sleep disturbance. The patient is not nervous/anxious.     Immunization History  Administered Date(s) Administered  . Influenza,inj,Quad PF,6+ Mos 12/29/2017  . Influenza-Unspecified 12/23/2013, 12/01/2014, 01/11/2016  . Pneumococcal Conjugate-13 11/22/2013  . Pneumococcal Polysaccharide-23 07/01/2006  . Tdap 12/17/2011  . Zoster 12/25/2005   Pertinent  Health Maintenance Due  Topic Date Due  . FOOT EXAM  12/06/1942  . OPHTHALMOLOGY EXAM  12/06/1942  . URINE MICROALBUMIN  02/11/2017  . INFLUENZA VACCINE  10/31/2018  . HEMOGLOBIN A1C  03/28/2019  . DEXA SCAN  Completed  . PNA vac Low Risk Adult  Completed   Fall Risk  06/17/2018 04/22/2018 01/22/2017 07/23/2016 04/25/2015  Falls in the past year? - 0 No No No  Number falls in past yr: - 0 - - -  Injury with Fall? - 0 - - -  Risk for fall due to : Impaired vision - - - -   Functional Status Survey:    Vitals:   12/01/18 0824  BP: (!) 161/79  Pulse: 62  Resp: 20  Temp: (!) 97.4 F (36.3 C)  SpO2: 93%  Weight: 137 lb (62.1 kg)  Height: 5' (1.524 m)  Body mass index is 26.76 kg/m. Physical Exam Vitals signs and nursing note reviewed.  Constitutional:      General: She is not in acute distress.    Appearance: Normal appearance. She is not ill-appearing, toxic-appearing or diaphoretic.     Comments: Over weight.   HENT:     Head: Normocephalic and atraumatic.     Nose: Nose normal.     Mouth/Throat:     Mouth: Mucous membranes are moist.  Eyes:     Extraocular Movements: Extraocular movements intact.     Conjunctiva/sclera: Conjunctivae normal.     Pupils: Pupils are equal, round, and reactive to light.  Neck:     Musculoskeletal: Normal range of motion and neck supple.  Cardiovascular:     Rate and Rhythm: Normal rate and regular rhythm.     Heart sounds: No murmur.  Pulmonary:      Breath sounds: No wheezing, rhonchi or rales.  Abdominal:     General: Bowel sounds are normal.     Palpations: Abdomen is soft.     Tenderness: There is no abdominal tenderness. There is no right CVA tenderness, left CVA tenderness, guarding or rebound.  Musculoskeletal:     Right lower leg: Edema present.     Left lower leg: Edema present.     Comments: Trace edema BLE, ambulates independently.   Skin:    General: Skin is warm and dry.  Neurological:     General: No focal deficit present.     Mental Status: She is alert. Mental status is at baseline.     Cranial Nerves: No cranial nerve deficit.     Motor: No weakness.     Coordination: Coordination normal.     Gait: Gait abnormal.     Comments: Oriented to person  Psychiatric:        Mood and Affect: Mood normal.        Behavior: Behavior normal.     Labs reviewed: Recent Labs    09/18/18 0120  09/19/18 0427  09/25/18 0340 09/26/18 0341 09/27/18 0401 09/28/18 0401 10/08/18  NA  --    < > 138   < > 139 140 138 138 142  K  --    < > 3.6   < > 3.8 3.9 3.7 3.3* 4.1  CL  --    < > 105   < > 107 105 105 102 103  CO2  --    < > 25   < > 24 23 25 27 30   GLUCOSE  --    < > 128*   < > 140* 128* 113* 157*  --   BUN  --    < > 5*   < > 6* 9 5* 9 9  CREATININE 0.51   < > 0.46   < > 0.36* 0.47 0.38* 0.55 0.5  CALCIUM  --    < > 7.5*   < > 8.8* 8.8* 8.0* 8.0* 8.5  MG 1.9  --  2.1   < > 1.9  --  1.8 2.3  --   PHOS 1.7*  --  1.5*  --   --   --   --   --   --    < > = values in this interval not displayed.   Recent Labs    09/17/18 1934 09/19/18 0427 09/24/18 0626 09/27/18 0401  AST 48*  --  25 20  ALT 29  --  20 16  ALKPHOS 168*  --  86 78  BILITOT 1.3*  --  0.2* 0.4  PROT 8.0  --  6.5 6.4*  ALBUMIN 2.7* 2.3* 2.3* 2.3*   Recent Labs    09/25/18 0340 09/26/18 0341 09/28/18 0401 10/08/18 11/05/18  WBC 17.3* 15.2* 16.1* 9.1 8.0  NEUTROABS 11.7* 9.8* 12.0*  --   --   HGB 10.0* 10.0* 9.8* 9.1* 10.2*  HCT 32.9* 32.4*  32.8* 28* 32*  MCV 89.6 91.3 90.6  --   --   PLT 407* 393 410* 414* 336   Lab Results  Component Value Date   TSH 4.29 12/29/2017   Lab Results  Component Value Date   HGBA1C 6.6 (H) 09/26/2018   Lab Results  Component Value Date   CHOL 275 (H) 04/06/2018   HDL 52 04/06/2018   LDLCALC 190 (H) 04/06/2018   TRIG 161 (H) 04/06/2018   CHOLHDL 5.3 (H) 04/06/2018    Significant Diagnostic Results in last 30 days:  No results found.  Assessment/Plan Hypertension Occasionally elevated Sbp in 160s, she denied HA, dizziness, change of vision, chest pain/pressure, or palpitation. Observe for now. VS q shift x 72 hours, log to provider.    Esophageal stricture Continue Omeprazole 20mg  qd.   Vascular dementia without behavioral disturbance ([place]) Continue SNF [facility] for safety, care assistance, continue Memantine 28mg  qd, Rivastigmine patch 9.5mg /24hr.   FTT (failure to thrive) in adult Stable, continue Mirtazapine 7.5mg  qd.      Family/ staff Communication: plan of care reviewed with the patient and charge nurse.   Labs/tests ordered:  none  Time spend 25 minutes.

## 2019-01-06 ENCOUNTER — Non-Acute Institutional Stay (SKILLED_NURSING_FACILITY): Payer: Medicare Other | Admitting: Internal Medicine

## 2019-01-06 ENCOUNTER — Encounter: Payer: Self-pay | Admitting: Internal Medicine

## 2019-01-06 DIAGNOSIS — M810 Age-related osteoporosis without current pathological fracture: Secondary | ICD-10-CM | POA: Diagnosis not present

## 2019-01-06 DIAGNOSIS — D649 Anemia, unspecified: Secondary | ICD-10-CM | POA: Diagnosis not present

## 2019-01-06 DIAGNOSIS — F339 Major depressive disorder, recurrent, unspecified: Secondary | ICD-10-CM

## 2019-01-06 DIAGNOSIS — R2681 Unsteadiness on feet: Secondary | ICD-10-CM | POA: Insufficient documentation

## 2019-01-06 DIAGNOSIS — E785 Hyperlipidemia, unspecified: Secondary | ICD-10-CM

## 2019-01-06 DIAGNOSIS — I10 Essential (primary) hypertension: Secondary | ICD-10-CM

## 2019-01-06 DIAGNOSIS — G301 Alzheimer's disease with late onset: Secondary | ICD-10-CM

## 2019-01-06 DIAGNOSIS — F0280 Dementia in other diseases classified elsewhere without behavioral disturbance: Secondary | ICD-10-CM

## 2019-01-06 NOTE — Progress Notes (Signed)
Location:    Nursing Home Room Number: 3 Place of Service:  SNF (31) Provider:  Veleta Miners MD  [name], MD  Patient Care Team: [name], MD as PCP - General (Internal Medicine) Lindwood Coke, MD as Consulting Physician (Dermatology) Christophe Louis, MD as Consulting Physician (Obstetrics and Gynecology) Latanya Maudlin, MD as Consulting Physician (Orthopedic Surgery) Garlan Fair, MD as Consulting Physician (Gastroenterology) Barnett Abu., MD as Consulting Physician (Cardiology) Neldon Mc Donnamarie Poag, MD as Consulting Physician (Allergy and Immunology) Rexene Alberts, MD as Consulting Physician (Cardiothoracic Surgery) Melida Quitter, MD as Consulting Physician (Otolaryngology) Ngetich, Nelda Bucks, NP as Nurse Practitioner (Family Medicine)  Extended Emergency Contact Information Primary Emergency Contact: Kathie Rhodes of [place] Phone: [phone] Mobile Phone: [phone] Relation: Legal Guardian Secondary Emergency Contact: Leana Gamer States of [place] Phone: [phone] Relation: Uncle  Code Status:  DNR Goals of care: Advanced Directive information Advanced Directives 01/06/2019  Does Patient Have a Medical Advance Directive? Yes  Type of Advance Directive [place]  Does patient want to make changes to medical advance directive? No - Patient declined  Copy of [place] in Chart? Yes - validated most recent copy scanned in chart (See row information)  Pre-existing out of facility DNR order (yellow form or pink MOST form) -     Chief Complaint  Patient presents with  . Medical Management of Chronic Issues  . Health Maintenance    Eye & foot exam, urine microalumin, influenza vaccine    HPI:  Pt is a 85 y.o. female seen today for medical management of chronic diseases.    Patient has h/o Hyperlipidemia, Osteoporosis, Allergic Rhinitis and Dementia Alzheimer,  Dysphagia s/p Dilatation 6/20 Patient is stable in the facility.  Her only complain is confusion. She has Aphasia and was c/o how she gets confused and dont know what to do. Seemed mildily Depressed. Weight is stable. No New nursing issues. Roams around in the unit  Past Medical History:  Diagnosis Date  . Abnormal CT scan, chest September 2011   Scar tissue  . Allergic rhinitis   . Alzheimer's dementia ([place])   . Asthma   . Diabetes mellitus    borderline and under control-diet ,exercise  . Esophageal stricture   . Family history of colon cancer    Mother  . GERD (gastroesophageal reflux disease)   . Hiatal hernia   . HLD (hyperlipidemia)   . Hypertension   . Osteopenia   . Pneumonia 12-15 yrs.ago   yrs. ago  . TIA (transient ischemic attack)   . Vitamin D deficiency    Past Surgical History:  Procedure Laterality Date  . ABDOMINAL HYSTERECTOMY  1980   . BALLOON DILATION N/A 09/27/2018   Procedure: BALLOON DILATION;  Surgeon: Jackquline Denmark, MD;  [facility]: [facility];  Service: Endoscopy;  Laterality: N/A;  . BIOPSY  09/27/2018   Procedure: BIOPSY;  Surgeon: Jackquline Denmark, MD;  [facility];  Service: Endoscopy;;  . CATARACT EXTRACTION Left 2014  . ESOPHAGOGASTRODUODENOSCOPY (EGD) WITH PROPOFOL N/A 09/27/2018   Procedure: ESOPHAGOGASTRODUODENOSCOPY (EGD) WITH PROPOFOL;  Surgeon: Jackquline Denmark, MD;  [facility];  Service: Endoscopy;  Laterality: N/A;  . SHOULDER OPEN ROTATOR CUFF REPAIR  05/22/2011   Procedure: ROTATOR CUFF REPAIR SHOULDER OPEN;  Surgeon: Tobi Bastos, MD;  Location: [facility];  Service: Orthopedics;  Laterality: Left;  . THORACOTOMY / DECORTICATION PARIETAL PLEURA Right 204   empyema  .  TONSILLECTOMY      Allergies  Allergen Reactions  . Aricept [Donepezil Hcl] Diarrhea  . Crestor [Rosuvastatin Calcium]     Myalgia  . Lipitor [Atorvastatin Calcium]   . Lovastatin     Myalgia  . Micardis [Telmisartan]     Fatigue  . Sertraline  Diarrhea  . Welchol [Colesevelam Hcl]     Constipation  . Zetia [Ezetimibe]     Myalgia  . Zocor [Simvastatin]     Myalgia    Allergies as of 01/06/2019      Reactions   Aricept [donepezil Hcl] Diarrhea   Crestor [rosuvastatin Calcium]    Myalgia   Lipitor [atorvastatin Calcium]    Lovastatin    Myalgia   Micardis [telmisartan]    Fatigue   Sertraline Diarrhea   Welchol [colesevelam Hcl]    Constipation   Zetia [ezetimibe]    Myalgia   Zocor [simvastatin]    Myalgia      Medication List       Accurate as of January 06, 2019  8:23 AM. If you have any questions, ask your nurse or doctor.        acetaminophen 325 MG tablet Commonly known as: TYLENOL Take 650 mg by mouth every 6 (six) hours as needed for headache.   aspirin EC 81 MG tablet Take 81 mg by mouth daily.   cetirizine 10 MG tablet Commonly known as: ZYRTEC Take 10 mg by mouth daily.   lactose free nutrition Liqd Take 237 mLs by mouth 2 (two) times daily between meals.   Liquid Calcium/Vitamin D 600-200 MG-UNIT Caps Generic drug: Calcium Carbonate-Vitamin D Take 1 tablet by mouth.   memantine 28 MG Cp24 24 hr capsule Commonly known as: NAMENDA XR TAKE ONE CAPSULE ONCE A DAY TO PRESERVE MEMORY.   mirtazapine 7.5 MG tablet Commonly known as: REMERON Take 7.5 mg by mouth daily.   omeprazole 20 MG capsule Commonly known as: PRILOSEC Take 20 mg by mouth daily.   rivastigmine 9.5 mg/24hr Commonly known as: EXELON Apply fresh patch daily and remove old patch to help preserve memory   zinc oxide 20 % ointment Apply 1 application topically as needed for irritation. Apply to buttocks/peri topical as needed       Review of Systems  Unable to perform ROS: Dementia    Immunization History  Administered Date(s) Administered  . Influenza,inj,Quad PF,6+ Mos 12/29/2017  . Influenza-Unspecified 12/23/2013, 12/01/2014, 01/11/2016  . Pneumococcal Conjugate-13 11/22/2013  . Pneumococcal  Polysaccharide-23 07/01/2006  . Tdap 12/17/2011  . Zoster 12/25/2005   Pertinent  Health Maintenance Due  Topic Date Due  . FOOT EXAM  12/06/1942  . OPHTHALMOLOGY EXAM  12/06/1942  . URINE MICROALBUMIN  02/11/2017  . INFLUENZA VACCINE  10/31/2018  . HEMOGLOBIN A1C  03/28/2019  . DEXA SCAN  Completed  . PNA vac Low Risk Adult  Completed   Fall Risk  06/17/2018 04/22/2018 01/22/2017 07/23/2016 04/25/2015  Falls in the past year? - 0 No No No  Number falls in past yr: - 0 - - -  Injury with Fall? - 0 - - -  Risk for fall due to : Impaired vision - - - -   Functional Status Survey:    Vitals:   01/06/19 0821  BP: (!) 147/77  Pulse: 78  Resp: 18  Temp: (!) 97.2 F (36.2 C)  SpO2: 92%  Weight: 143 lb (64.9 kg)  Height: 5' (1.524 m)   Body mass index is 27.93 kg/m. Physical Exam  Constitutional:  Well-developed and well-nourished.  HENT:  Head: Normocephalic.  Mouth/Throat: Oropharynx is clear and moist.  Eyes: Pupils are equal, round, and reactive to light.  Neck: Neck supple.  Cardiovascular: Normal rate and normal heart sounds.  No murmur heard. Pulmonary/Chest: Effort normal and breath sounds normal. No respiratory distress. No wheezes. She has no rales.  Abdominal: Soft. Bowel sounds are normal. No distension. There is no tenderness. There is no rebound.  Musculoskeletal: No edema.  Lymphadenopathy: none Neurological: No Focal deficits Follows Commands. Gait is normal with no deficits Skin: Skin is warm and dry.  Psychiatric: Normal mood and affect. Behavior is normal. Thought content normal.   Labs reviewed: Recent Labs    09/18/18 0120  09/19/18 0427  09/25/18 0340 09/26/18 0341 09/27/18 0401 09/28/18 0401 10/08/18  NA  --    < > 138   < > 139 140 138 138 142  K  --    < > 3.6   < > 3.8 3.9 3.7 3.3* 4.1  CL  --    < > 105   < > 107 105 105 102 103  CO2  --    < > 25   < > 24 23 25 27 30   GLUCOSE  --    < > 128*   < > 140* 128* 113* 157*  --   BUN  --     < > 5*   < > 6* 9 5* 9 9  CREATININE 0.51   < > 0.46   < > 0.36* 0.47 0.38* 0.55 0.5  CALCIUM  --    < > 7.5*   < > 8.8* 8.8* 8.0* 8.0* 8.5  MG 1.9  --  2.1   < > 1.9  --  1.8 2.3  --   PHOS 1.7*  --  1.5*  --   --   --   --   --   --    < > = values in this interval not displayed.   Recent Labs    09/17/18 1934 09/19/18 0427 09/24/18 0626 09/27/18 0401  AST 48*  --  25 20  ALT 29  --  20 16  ALKPHOS 168*  --  86 78  BILITOT 1.3*  --  0.2* 0.4  PROT 8.0  --  6.5 6.4*  ALBUMIN 2.7* 2.3* 2.3* 2.3*   Recent Labs    09/25/18 0340 09/26/18 0341 09/28/18 0401 10/08/18 11/05/18  WBC 17.3* 15.2* 16.1* 9.1 8.0  NEUTROABS 11.7* 9.8* 12.0*  --   --   HGB 10.0* 10.0* 9.8* 9.1* 10.2*  HCT 32.9* 32.4* 32.8* 28* 32*  MCV 89.6 91.3 90.6  --   --   PLT 407* 393 410* 414* 336   Lab Results  Component Value Date   TSH 4.29 12/29/2017   Lab Results  Component Value Date   HGBA1C 6.6 (H) 09/26/2018   Lab Results  Component Value Date   CHOL 275 (H) 04/06/2018   HDL 52 04/06/2018   LDLCALC 190 (H) 04/06/2018   TRIG 161 (H) 04/06/2018   CHOLHDL 5.3 (H) 04/06/2018    Significant Diagnostic Results in last 30 days:  No results found.  Assessment/Plan  Essential hypertension Not on any Meds Will continue to monitor Late onset Alzheimer's disease without behavioral disturbance  On Namenda and Exelon Patch Continues to be completely dependent for her ADLs   Age-related osteoporosis without current pathological fracture Start back on fosamax Her T score was -  2.6 in 2018  Normocytic anemia Repeat CBC  Unsteady gait Check Vit B12  Depression, recurrent ([place]) Start on lexapro. Has allergy listed to sertraline She is also on low-dose of Remeron  Hyperlipidemia, unspecified hyperlipidemia type ? Allergic to statin   History of dysphagia S/p dilatation History of TIA On aspirin Will monitor BP Cannot use Statin due to allergy listed Will d/w her POA to start her on  Statin with Close monitoring in facility Family/ staff Communication:   Labs/tests ordered:  CBC,CMP,Vit B12, TSH  Total time spent in this patient care encounter was  25_  minutes; greater than 50% of the visit spent counseling patient and staff, reviewing records , Labs and coordinating care for problems addressed at this encounter.

## 2019-01-08 LAB — HEPATIC FUNCTION PANEL
ALT: 14 (ref 7–35)
AST: 17 (ref 13–35)
Alkaline Phosphatase: 54 (ref 25–125)
Bilirubin, Total: 0.5

## 2019-01-08 LAB — BASIC METABOLIC PANEL
BUN: 23 — AB (ref 4–21)
Creatinine: 0.7 (ref 0.5–1.1)
Glucose: 91
Potassium: 4.4 (ref 3.4–5.3)
Sodium: 141 (ref 137–147)

## 2019-01-08 LAB — CBC AND DIFFERENTIAL
HCT: 38 (ref 36–46)
Hemoglobin: 12.4 (ref 12.0–16.0)
Neutrophils Absolute: 3542
Platelets: 298 (ref 150–399)
WBC: 7.2

## 2019-01-08 LAB — TSH: TSH: 3.01 (ref 0.41–5.90)

## 2019-01-08 LAB — VITAMIN B12: Vitamin B-12: 440

## 2019-01-25 ENCOUNTER — Encounter: Payer: Self-pay | Admitting: Internal Medicine

## 2019-01-25 ENCOUNTER — Non-Acute Institutional Stay (SKILLED_NURSING_FACILITY): Payer: Medicare Other | Admitting: Internal Medicine

## 2019-01-25 DIAGNOSIS — I10 Essential (primary) hypertension: Secondary | ICD-10-CM

## 2019-01-25 DIAGNOSIS — G301 Alzheimer's disease with late onset: Secondary | ICD-10-CM

## 2019-01-25 DIAGNOSIS — M810 Age-related osteoporosis without current pathological fracture: Secondary | ICD-10-CM

## 2019-01-25 DIAGNOSIS — F339 Major depressive disorder, recurrent, unspecified: Secondary | ICD-10-CM | POA: Diagnosis not present

## 2019-01-25 DIAGNOSIS — F0280 Dementia in other diseases classified elsewhere without behavioral disturbance: Secondary | ICD-10-CM

## 2019-01-25 LAB — CHLORIDE
Albumin: 3.9
Calcium: 9.3
Carbon Dioxide, Total: 28
Chloride: 104
Globulin: 2.9
Total Protein: 6.8 (ref 6.4–8.2)

## 2019-01-25 NOTE — Progress Notes (Signed)
Location:    Nursing Home Room Number: 3 Place of Service:  SNF (31) Provider:  [name], MD  [name], MD  Patient Care Team: [name], MD as PCP - General (Internal Medicine) Lindwood Coke, MD as Consulting Physician (Dermatology) Christophe Louis, MD as Consulting Physician (Obstetrics and Gynecology) Latanya Maudlin, MD as Consulting Physician (Orthopedic Surgery) Garlan Fair, MD as Consulting Physician (Gastroenterology) Barnett Abu., MD as Consulting Physician (Cardiology) Neldon Mc Donnamarie Poag, MD as Consulting Physician (Allergy and Immunology) Rexene Alberts, MD as Consulting Physician (Cardiothoracic Surgery) Melida Quitter, MD as Consulting Physician (Otolaryngology) Ngetich, Nelda Bucks, NP as Nurse Practitioner (Family Medicine)  Extended Emergency Contact Information Primary Emergency Contact: Kathie Rhodes of [place] Phone: [phone] Mobile Phone: [phone] Relation: Legal Guardian Secondary Emergency Contact: Leana Gamer [place] of [place] Phone: [phone] Relation: Uncle  Code Status:  DNR Goals of care: Advanced Directive information Advanced Directives 01/06/2019  Does Patient Have a Medical Advance Directive? Yes  Type of Advance Directive [place]  Does patient want to make changes to medical advance directive? No - Patient declined  Copy of [place] in Chart? Yes - validated most recent copy scanned in chart (See row information)  Pre-existing out of facility DNR order (yellow form or pink MOST form) -     Chief Complaint  Patient presents with  . Acute Visit    Follow up on depression    HPI:  Pt is a 85 y.o. female seen today for an acute visit for Weight Gain and reval her Remeron  Patient has h/o Hyperlipidemia, Osteoporosis, Allergic Rhinitis and Dementia Alzheimer, Dysphagia s/p Dilatation 6/20 Patient is stable in the facility.  Seen today as she has slowly gained 10-15 lbs in past few Months . Due for reval of her Remeron Patient is stable, No New complains No Acute issues  Past Medical History:  Diagnosis Date  . Abnormal CT scan, chest September 2011   Scar tissue  . Allergic rhinitis   . Alzheimer's dementia ([place])   . Asthma   . Diabetes mellitus    borderline and under control-diet ,exercise  . Esophageal stricture   . Family history of colon cancer    Mother  . GERD (gastroesophageal reflux disease)   . Hiatal hernia   . HLD (hyperlipidemia)   . Hypertension   . Osteopenia   . Pneumonia 12-15 yrs.ago   yrs. ago  . TIA (transient ischemic attack)   . Vitamin D deficiency    Past Surgical History:  Procedure Laterality Date  . ABDOMINAL HYSTERECTOMY  1980   . BALLOON DILATION N/A 09/27/2018   Procedure: BALLOON DILATION;  Surgeon: Jackquline Denmark, MD;  [facility];  Service: Endoscopy;  Laterality: N/A;  . BIOPSY  09/27/2018   Procedure: BIOPSY;  Surgeon: Jackquline Denmark, MD;  [facility];  Service: Endoscopy;;  . CATARACT EXTRACTION Left 2014  . ESOPHAGOGASTRODUODENOSCOPY (EGD) WITH PROPOFOL N/A 09/27/2018   Procedure: ESOPHAGOGASTRODUODENOSCOPY (EGD) WITH PROPOFOL;  Surgeon: Jackquline Denmark, MD;  [facility];  Service: Endoscopy;  Laterality: N/A;  . SHOULDER OPEN ROTATOR CUFF REPAIR  05/22/2011   Procedure: ROTATOR CUFF REPAIR SHOULDER OPEN;  Surgeon: Tobi Bastos, MD;  [facility];  Service: Orthopedics;  Laterality: Left;  . THORACOTOMY / DECORTICATION PARIETAL PLEURA Right 204   empyema  . TONSILLECTOMY      Allergies  Allergen Reactions  . Aricept [Donepezil Hcl]  Diarrhea  . Crestor [Rosuvastatin Calcium]     Myalgia  . Lipitor [Atorvastatin Calcium]   . Lovastatin     Myalgia  . Micardis [Telmisartan]     Fatigue  . Sertraline Diarrhea  . Welchol [Colesevelam Hcl]     Constipation  . Zetia [Ezetimibe]     Myalgia  . Zocor [Simvastatin]      Myalgia    Allergies as of 01/25/2019      Reactions   Aricept [donepezil Hcl] Diarrhea   Crestor [rosuvastatin Calcium]    Myalgia   Lipitor [atorvastatin Calcium]    Lovastatin    Myalgia   Micardis [telmisartan]    Fatigue   Sertraline Diarrhea   Welchol [colesevelam Hcl]    Constipation   Zetia [ezetimibe]    Myalgia   Zocor [simvastatin]    Myalgia      Medication List       Accurate as of January 25, 2019  2:52 PM. If you have any questions, ask your nurse or doctor.        acetaminophen 325 MG tablet Commonly known as: TYLENOL Take 650 mg by mouth every 6 (six) hours as needed for headache.   alendronate 70 MG tablet Commonly known as: FOSAMAX Take 70 mg by mouth once a week. Take with a full glass of water on an empty stomach.   aspirin EC 81 MG tablet Take 81 mg by mouth daily.   cetirizine 10 MG tablet Commonly known as: ZYRTEC Take 10 mg by mouth daily.   escitalopram 10 MG tablet Commonly known as: LEXAPRO Take 10 mg by mouth daily.   lactose free nutrition Liqd Take 237 mLs by mouth 2 (two) times daily between meals.   Liquid Calcium/Vitamin D 600-200 MG-UNIT Caps Generic drug: Calcium Carbonate-Vitamin D Take 1 tablet by mouth.   memantine 28 MG Cp24 24 hr capsule Commonly known as: NAMENDA XR TAKE ONE CAPSULE ONCE A DAY TO PRESERVE MEMORY.   mirtazapine 7.5 MG tablet Commonly known as: REMERON Take 7.5 mg by mouth daily.   omeprazole 20 MG capsule Commonly known as: PRILOSEC Take 20 mg by mouth daily.   rivastigmine 9.5 mg/24hr Commonly known as: EXELON Apply fresh patch daily and remove old patch to help preserve memory   zinc oxide 20 % ointment Apply 1 application topically as needed for irritation. Apply to buttocks/peri topical as needed       Review of Systems  Unable to perform ROS: Dementia    Immunization History  Administered Date(s) Administered  . Influenza,inj,Quad PF,6+ Mos 12/29/2017  .  Influenza-Unspecified 12/23/2013, 12/01/2014, 01/11/2016  . Pneumococcal Conjugate-13 11/22/2013  . Pneumococcal Polysaccharide-23 07/01/2006  . Tdap 12/17/2011  . Zoster 12/25/2005   Pertinent  Health Maintenance Due  Topic Date Due  . FOOT EXAM  12/06/1942  . OPHTHALMOLOGY EXAM  12/06/1942  . URINE MICROALBUMIN  02/11/2017  . INFLUENZA VACCINE  10/31/2018  . HEMOGLOBIN A1C  03/28/2019  . DEXA SCAN  Completed  . PNA vac Low Risk Adult  Completed   Fall Risk  06/17/2018 04/22/2018 01/22/2017 07/23/2016 04/25/2015  Falls in the past year? - 0 No No No  Number falls in past yr: - 0 - - -  Injury with Fall? - 0 - - -  Risk for fall due to : Impaired vision - - - -   Functional Status Survey:    Vitals:   01/25/19 1134  BP: 129/75  Pulse: 73  Resp: 19  Temp:  98.2 F (36.8 C)  SpO2: 95%  Weight: 143 lb (64.9 kg)  Height: 5' (1.524 m)   Body mass index is 27.93 kg/m. Physical Exam Constitutional:. Well-developed and well-nourished.  HENT:  Head: Normocephalic.  Mouth/Throat: Oropharynx is clear and moist.  Eyes: Pupils are equal, round, and reactive to light.  Neck: Neck supple.  Cardiovascular: Normal rate and normal heart sounds.  No murmur heard. Pulmonary/Chest: Effort normal and breath sounds normal. No respiratory distress. No wheezes. She has no rales.  Abdominal: Soft. Bowel sounds are normal. No distension. There is no tenderness. There is no rebound.  Musculoskeletal: No edema.  Lymphadenopathy: none Neurological: No Focal Deficits. Walk with No cane or walker Skin: Skin is warm and dry.  Psychiatric: Normal mood and affect. Behavior is normal. Thought content normal.   Labs reviewed: Recent Labs    09/18/18 0120  09/19/18 0427  09/25/18 0340 09/26/18 0341 09/27/18 0401 09/28/18 0401 10/08/18 01/08/19  NA  --    < > 138   < > 139 140 138 138 142 141  K  --    < > 3.6   < > 3.8 3.9 3.7 3.3* 4.1 4.4  CL  --    < > 105   < > 107 105 105 102 103 104  CO2   --    < > 25   < > 24 23 25 27 30 28   GLUCOSE  --    < > 128*   < > 140* 128* 113* 157*  --   --   BUN  --    < > 5*   < > 6* 9 5* 9 9 23*  CREATININE 0.51   < > 0.46   < > 0.36* 0.47 0.38* 0.55 0.5 0.7  CALCIUM  --    < > 7.5*   < > 8.8* 8.8* 8.0* 8.0* 8.5 9.3  MG 1.9  --  2.1   < > 1.9  --  1.8 2.3  --   --   PHOS 1.7*  --  1.5*  --   --   --   --   --   --   --    < > = values in this interval not displayed.   Recent Labs    09/17/18 1934  09/24/18 0626 09/27/18 0401 01/08/19  AST 48*  --  25 20 17   ALT 29  --  20 16 14   ALKPHOS 168*  --  86 78 54  BILITOT 1.3*  --  0.2* 0.4  --   PROT 8.0  --  6.5 6.4* 6.8  ALBUMIN 2.7*   < > 2.3* 2.3* 3.9   < > = values in this interval not displayed.   Recent Labs    09/25/18 0340 09/26/18 0341 09/28/18 0401 10/08/18 11/05/18 01/08/19  WBC 17.3* 15.2* 16.1* 9.1 8.0 7.2  NEUTROABS 11.7* 9.8* 12.0*  --   --  3,542  HGB 10.0* 10.0* 9.8* 9.1* 10.2* 12.4  HCT 32.9* 32.4* 32.8* 28* 32* 38  MCV 89.6 91.3 90.6  --   --   --   PLT 407* 393 410* 414* 336 298   Lab Results  Component Value Date   TSH 3.01 01/08/2019   Lab Results  Component Value Date   HGBA1C 6.6 (H) 09/26/2018   Lab Results  Component Value Date   CHOL 275 (H) 04/06/2018   HDL 52 04/06/2018   LDLCALC 190 (H) 04/06/2018   TRIG  161 (H) 04/06/2018   CHOLHDL 5.3 (H) 04/06/2018    Significant Diagnostic Results in last 30 days:  No results found.  Assessment/Plan Depression Discontinue Remeron Doing well with weight. Continue on Lexapro   Other Issues  Late onset Alzheimer's disease without behavioral disturbance  On Namenda and Exelon Patch Continues to be completely dependent for her ADLs   Age-related osteoporosis without current pathological fracture Start back on fosamax Her T score was -2.6 in 2018  Normocytic anemia Hgb is 12.4  Unsteady gait  Vit B12 Normal   Hyperlipidemia, unspecified hyperlipidemia type ? Allergic to statin    History of dysphagia S/p dilatation History of TIA On aspirin Will monitor BP Cannot use Statin due to allergy listed Will d/w her POA to start her on Statin with Close monitoring in facility Family/ staff Communication:   Labs/tests ordered:   Total time spent in this patient care encounter was  _25  minutes; greater than 50% of the visit spent counseling patient and staff, reviewing records , Labs and coordinating care for problems addressed at this encounter.

## 2019-02-15 ENCOUNTER — Encounter: Payer: Self-pay | Admitting: Internal Medicine

## 2019-02-15 ENCOUNTER — Non-Acute Institutional Stay (SKILLED_NURSING_FACILITY): Payer: Medicare Other | Admitting: Internal Medicine

## 2019-02-15 DIAGNOSIS — R05 Cough: Secondary | ICD-10-CM | POA: Diagnosis not present

## 2019-02-15 DIAGNOSIS — M810 Age-related osteoporosis without current pathological fracture: Secondary | ICD-10-CM | POA: Diagnosis not present

## 2019-02-15 DIAGNOSIS — F0280 Dementia in other diseases classified elsewhere without behavioral disturbance: Secondary | ICD-10-CM

## 2019-02-15 DIAGNOSIS — R058 Other specified cough: Secondary | ICD-10-CM

## 2019-02-15 DIAGNOSIS — I10 Essential (primary) hypertension: Secondary | ICD-10-CM

## 2019-02-15 DIAGNOSIS — E785 Hyperlipidemia, unspecified: Secondary | ICD-10-CM

## 2019-02-15 DIAGNOSIS — G301 Alzheimer's disease with late onset: Secondary | ICD-10-CM | POA: Diagnosis not present

## 2019-02-15 DIAGNOSIS — K222 Esophageal obstruction: Secondary | ICD-10-CM

## 2019-02-15 NOTE — Progress Notes (Signed)
Location:    [facility] Room Number: 3 Place of Service:  SNF (31) Provider:  [name], MD  [name], MD  Patient Care Team: [name], MD as PCP - General (Internal Medicine) Lindwood Coke, MD as Consulting Physician (Dermatology) Christophe Louis, MD as Consulting Physician (Obstetrics and Gynecology) Latanya Maudlin, MD as Consulting Physician (Orthopedic Surgery) Garlan Fair, MD as Consulting Physician (Gastroenterology) Barnett Abu., MD as Consulting Physician (Cardiology) Neldon Mc Donnamarie Poag, MD as Consulting Physician (Allergy and Immunology) Rexene Alberts, MD as Consulting Physician (Cardiothoracic Surgery) Melida Quitter, MD as Consulting Physician (Otolaryngology) Ngetich, Nelda Bucks, NP as Nurse Practitioner (Family Medicine)  Extended Emergency Contact Information Primary Emergency Contact: Kathie Rhodes of [place] Phone: [phone] Mobile Phone: [phone] Relation: Legal Guardian Secondary Emergency Contact: Leana Gamer [place] of [place] Phone: [phone] Relation: Uncle  Code Status:  DNR Goals of care: Advanced Directive information Advanced Directives 01/06/2019  Does Patient Have a Medical Advance Directive? Yes  Type of Advance Directive [place]  Does patient want to make changes to medical advance directive? No - Patient declined  Copy of [place] in Chart? Yes - validated most recent copy scanned in chart (See row information)  Pre-existing out of facility DNR order (yellow form or pink MOST form) -     Chief Complaint  Patient presents with  . Acute Visit    Cough    HPI:  Pt is a 85 y.o. female seen today for an acute visit for Productive cough per nurses  Patient has h/o Hyperlipidemia, Osteoporosis, Allergic Rhinitis and Dementia Alzheimer, Dysphagia s/p Dilatation 6/20 Was noticed to have productive cough over the weekend No  fever or Chest pain. Patient denies everything but difficult to assess due to her Dementia Eating well. No Nausea or vomiting  Past Medical History:  Diagnosis Date  . Abnormal CT scan, chest September 2011   Scar tissue  . Allergic rhinitis   . Alzheimer's dementia ([place])   . Asthma   . Diabetes mellitus    borderline and under control-diet ,exercise  . Esophageal stricture   . Family history of colon cancer    Mother  . GERD (gastroesophageal reflux disease)   . Hiatal hernia   . HLD (hyperlipidemia)   . Hypertension   . Osteopenia   . Pneumonia 12-15 yrs.ago   yrs. ago  . TIA (transient ischemic attack)   . Vitamin D deficiency    Past Surgical History:  Procedure Laterality Date  . ABDOMINAL HYSTERECTOMY  1980   . BALLOON DILATION N/A 09/27/2018   Procedure: BALLOON DILATION;  Surgeon: Jackquline Denmark, MD;  Location: [facility];  Service: Endoscopy;  Laterality: N/A;  . BIOPSY  09/27/2018   Procedure: BIOPSY;  Surgeon: Jackquline Denmark, MD;  Location: [facility];  Service: Endoscopy;;  . CATARACT EXTRACTION Left 2014  . ESOPHAGOGASTRODUODENOSCOPY (EGD) WITH PROPOFOL N/A 09/27/2018   Procedure: ESOPHAGOGASTRODUODENOSCOPY (EGD) WITH PROPOFOL;  Surgeon: Jackquline Denmark, MD;  [facility];  Service: Endoscopy;  Laterality: N/A;  . SHOULDER OPEN ROTATOR CUFF REPAIR  05/22/2011   Procedure: ROTATOR CUFF REPAIR SHOULDER OPEN;  Surgeon: Tobi Bastos, MD;  Location: [facility];  Service: Orthopedics;  Laterality: Left;  . THORACOTOMY / DECORTICATION PARIETAL PLEURA Right 204   empyema  . TONSILLECTOMY      Allergies  Allergen Reactions  . Aricept [Donepezil Hcl] Diarrhea  . Crestor [Rosuvastatin Calcium]  Myalgia  . Lipitor [Atorvastatin Calcium]   . Lovastatin     Myalgia  . Micardis [Telmisartan]     Fatigue  . Sertraline Diarrhea  . Welchol [Colesevelam Hcl]     Constipation  . Zetia [Ezetimibe]     Myalgia  . Zocor [Simvastatin]     Myalgia     Allergies as of 02/15/2019      Reactions   Aricept [donepezil Hcl] Diarrhea   Crestor [rosuvastatin Calcium]    Myalgia   Lipitor [atorvastatin Calcium]    Lovastatin    Myalgia   Micardis [telmisartan]    Fatigue   Sertraline Diarrhea   Welchol [colesevelam Hcl]    Constipation   Zetia [ezetimibe]    Myalgia   Zocor [simvastatin]    Myalgia      Medication List       Accurate as of February 15, 2019 11:19 AM. If you have any questions, ask your nurse or doctor.        STOP taking these medications   mirtazapine 7.5 MG tablet Commonly known as: REMERON Stopped by: [name], MD     TAKE these medications   acetaminophen 325 MG tablet Commonly known as: TYLENOL Take 650 mg by mouth every 6 (six) hours as needed for headache.   alendronate 70 MG tablet Commonly known as: FOSAMAX Take 70 mg by mouth once a week. Take with a full glass of water on an empty stomach.   aspirin EC 81 MG tablet Take 81 mg by mouth daily.   cetirizine 10 MG tablet Commonly known as: ZYRTEC Take 10 mg by mouth daily.   escitalopram 10 MG tablet Commonly known as: LEXAPRO Take 10 mg by mouth daily.   lactose free nutrition Liqd Take 237 mLs by mouth 2 (two) times daily between meals.   Liquid Calcium/Vitamin D 600-200 MG-UNIT Caps Generic drug: Calcium Carbonate-Vitamin D Take 1 tablet by mouth.   memantine 28 MG Cp24 24 hr capsule Commonly known as: NAMENDA XR TAKE ONE CAPSULE ONCE A DAY TO PRESERVE MEMORY.   omeprazole 20 MG capsule Commonly known as: PRILOSEC Take 20 mg by mouth daily.   rivastigmine 9.5 mg/24hr Commonly known as: EXELON Apply fresh patch daily and remove old patch to help preserve memory   zinc oxide 20 % ointment Apply 1 application topically as needed for irritation. Apply to buttocks/peri topical as needed       Review of Systems  Review of Systems  Constitutional: Negative for activity change, appetite change, chills, diaphoresis,  fatigue and fever.  HENT: Negative for mouth sores, postnasal drip, rhinorrhea, sinus pain and sore throat.   Respiratory: Negative for apnea, cough, chest tightness, shortness of breath and wheezing.   Cardiovascular: Negative for chest pain, palpitations and leg swelling.  Gastrointestinal: Negative for abdominal distention, abdominal pain, constipation, diarrhea, nausea and vomiting.  Genitourinary: Negative for dysuria and frequency.  Musculoskeletal: Negative for arthralgias, joint swelling and myalgias.  Skin: Negative for rash.  Neurological: Negative for dizziness, syncope, weakness, light-headedness and numbness.  Psychiatric/Behavioral: Negative for behavioral problems, confusion and sleep disturbance.     Immunization History  Administered Date(s) Administered  . Influenza,inj,Quad PF,6+ Mos 12/29/2017  . Influenza-Unspecified 12/23/2013, 12/01/2014, 01/11/2016  . Pneumococcal Conjugate-13 11/22/2013  . Pneumococcal Polysaccharide-23 07/01/2006  . Tdap 12/17/2011  . Zoster 12/25/2005   Pertinent  Health Maintenance Due  Topic Date Due  . FOOT EXAM  12/06/1942  . OPHTHALMOLOGY EXAM  12/06/1942  . URINE MICROALBUMIN  02/11/2017  . INFLUENZA VACCINE  10/31/2018  . HEMOGLOBIN A1C  03/28/2019  . DEXA SCAN  Completed  . PNA vac Low Risk Adult  Completed   Fall Risk  06/17/2018 04/22/2018 01/22/2017 07/23/2016 04/25/2015  Falls in the past year? - 0 No No No  Number falls in past yr: - 0 - - -  Injury with Fall? - 0 - - -  Risk for fall due to : Impaired vision - - - -   Functional Status Survey:    Vitals:   02/15/19 1116  BP: 132/66  Pulse: 63  Resp: 18  Temp: (!) 97.4 F (36.3 C)  SpO2: 97%  Weight: 140 lb (63.5 kg)  Height: 5' (1.524 m)   Body mass index is 27.34 kg/m. Physical Exam  Constitutional: Oriented to person, place, and time. Well-developed and well-nourished.  HENT:  Head: Normocephalic.  Mouth/Throat: Oropharynx is clear and moist.  Eyes:  Pupils are equal, round, and reactive to light.  Neck: Neck supple.  Cardiovascular: Normal rate and normal heart sounds.  No murmur heard. Pulmonary/Chest: Effort normal and breath sounds normal. No respiratory distress. No wheezes. She has no rales.  Abdominal: Soft. Bowel sounds are normal. No distension. There is no tenderness. There is no rebound.  Musculoskeletal: No edema.  Lymphadenopathy: none Neurological: Alert and oriented to person, place, and time.  Skin: Skin is warm and dry.  Psychiatric: Normal mood and affect. Behavior is normal. Thought content normal.    Labs reviewed: Recent Labs    09/18/18 0120  09/19/18 0427  09/25/18 0340 09/26/18 0341 09/27/18 0401 09/28/18 0401 10/08/18 01/08/19  NA  --    < > 138   < > 139 140 138 138 142 141  K  --    < > 3.6   < > 3.8 3.9 3.7 3.3* 4.1 4.4  CL  --    < > 105   < > 107 105 105 102 103 104  CO2  --    < > 25   < > 24 23 25 27 30 28   GLUCOSE  --    < > 128*   < > 140* 128* 113* 157*  --   --   BUN  --    < > 5*   < > 6* 9 5* 9 9 23*  CREATININE 0.51   < > 0.46   < > 0.36* 0.47 0.38* 0.55 0.5 0.7  CALCIUM  --    < > 7.5*   < > 8.8* 8.8* 8.0* 8.0* 8.5 9.3  MG 1.9  --  2.1   < > 1.9  --  1.8 2.3  --   --   PHOS 1.7*  --  1.5*  --   --   --   --   --   --   --    < > = values in this interval not displayed.   Recent Labs    09/17/18 1934  09/24/18 0626 09/27/18 0401 01/08/19  AST 48*  --  25 20 17   ALT 29  --  20 16 14   ALKPHOS 168*  --  86 78 54  BILITOT 1.3*  --  0.2* 0.4  --   PROT 8.0  --  6.5 6.4* 6.8  ALBUMIN 2.7*   < > 2.3* 2.3* 3.9   < > = values in this interval not displayed.   Recent Labs    09/25/18 0340 09/26/18 0341 09/28/18 0401 10/08/18  11/05/18 01/08/19  WBC 17.3* 15.2* 16.1* 9.1 8.0 7.2  NEUTROABS 11.7* 9.8* 12.0*  --   --  3,542  HGB 10.0* 10.0* 9.8* 9.1* 10.2* 12.4  HCT 32.9* 32.4* 32.8* 28* 32* 38  MCV 89.6 91.3 90.6  --   --   --   PLT 407* 393 410* 414* 336 298   Lab Results   Component Value Date   TSH 3.01 01/08/2019   Lab Results  Component Value Date   HGBA1C 6.6 (H) 09/26/2018   Lab Results  Component Value Date   CHOL 275 (H) 04/06/2018   HDL 52 04/06/2018   LDLCALC 190 (H) 04/06/2018   TRIG 161 (H) 04/06/2018   CHOLHDL 5.3 (H) 04/06/2018    Significant Diagnostic Results in last 30 days:  No results found.  Assessment/Plan Productive cough Will repeat Chest Xray as patient as she has h/o Aspiration and pneumonia  Late onset Alzheimer's disease without behavioral disturbance (HCC) On Exelon and Namenda  Age-related osteoporosis without current pathological fracture Continue Fosamax Hyperlipidemia Allergic to Zetia and statin Esophageal stricture S/P Dilatation On Prilosec Depression Stable on Lexapro    Family/ staff Communication:   Labs/tests ordered:   Total time spent in this patient care encounter was  _25  minutes; greater than 50% of the visit spent counseling patient and staff, reviewing records , Labs and coordinating care for problems addressed at this encounter.

## 2019-02-16 ENCOUNTER — Non-Acute Institutional Stay (SKILLED_NURSING_FACILITY): Payer: Medicare Other | Admitting: Nurse Practitioner

## 2019-02-16 ENCOUNTER — Encounter: Payer: Self-pay | Admitting: Nurse Practitioner

## 2019-02-16 DIAGNOSIS — J189 Pneumonia, unspecified organism: Secondary | ICD-10-CM | POA: Diagnosis not present

## 2019-02-16 DIAGNOSIS — F0150 Vascular dementia without behavioral disturbance: Secondary | ICD-10-CM | POA: Diagnosis not present

## 2019-02-16 DIAGNOSIS — R1310 Dysphagia, unspecified: Secondary | ICD-10-CM

## 2019-02-16 DIAGNOSIS — R1319 Other dysphagia: Secondary | ICD-10-CM

## 2019-02-16 NOTE — Assessment & Plan Note (Signed)
Continue [facility] for safety, care assistance, continue Rivastigmine patch, Memantine for memory.

## 2019-02-16 NOTE — Assessment & Plan Note (Signed)
Continue Omeprazole 20mg qd.  

## 2019-02-16 NOTE — Progress Notes (Signed)
Location:   SNF [place] Room Number: 10/19/2018 Place of Service:  SNF (31) Provider:  Vaudine Dutan NP  [name], MD  Patient Care Team: [name], MD as PCP - General (Internal Medicine) Lindwood Coke, MD as Consulting Physician (Dermatology) Christophe Louis, MD as Consulting Physician (Obstetrics and Gynecology) Latanya Maudlin, MD as Consulting Physician (Orthopedic Surgery) Garlan Fair, MD as Consulting Physician (Gastroenterology) Barnett Abu., MD as Consulting Physician (Cardiology) Neldon Mc Donnamarie Poag, MD as Consulting Physician (Allergy and Immunology) Rexene Alberts, MD as Consulting Physician (Cardiothoracic Surgery) Melida Quitter, MD as Consulting Physician (Otolaryngology) Ngetich, Nelda Bucks, NP as Nurse Practitioner (Family Medicine)  Extended Emergency Contact Information Primary Emergency Contact: Kathie Rhodes of [place] Phone: [phone] Mobile Phone: [phone] Relation: Legal Guardian Secondary Emergency Contact: Leana Gamer [place] of [place] Phone: [phone] Relation: Uncle  Code Status:  DNR Goals of care: Advanced Directive information Advanced Directives 01/06/2019  Does Patient Have a Medical Advance Directive? Yes  Type of Advance Directive [place]  Does patient want to make changes to medical advance directive? No - Patient declined  Copy of [place] in Chart? Yes - validated most recent copy scanned in chart (See row information)  Pre-existing out of facility DNR order (yellow form or pink MOST form) -     Chief Complaint  Patient presents with   Acute Visit    Pnuemonia    HPI:  Pt is a 85 y.o. female seen today for an acute visit for cough given hx of aspiration pneumonia, HPI was provided with assistance of staff, on Rivastigmine patch and Mamentine 28mg  qd for memory. She is afebrile, no O2 desaturation. She is in her usual  state of health. 02/15/19 CXR pulmonary infiltrate is present in the left lung base and small left pleural effusion is present consistent with pneumonia. Per on call Levaquin 500mg  qd x 7 days. Hx of dysphagia, stable, on Omeprazole 20mg  qd.   Past Medical History:  Diagnosis Date   Abnormal CT scan, chest September 2011   Scar tissue   Allergic rhinitis    Alzheimer's dementia ([place])    Asthma    Diabetes mellitus    borderline and under control-diet ,exercise   Esophageal stricture    Family history of colon cancer    Mother   GERD (gastroesophageal reflux disease)    Hiatal hernia    HLD (hyperlipidemia)    Hypertension    Osteopenia    Pneumonia 12-15 yrs.ago   yrs. ago   TIA (transient ischemic attack)    Vitamin D deficiency    Past Surgical History:  Procedure Laterality Date   ABDOMINAL HYSTERECTOMY  1980    BALLOON DILATION N/A 09/27/2018   Procedure: BALLOON DILATION;  Surgeon: Jackquline Denmark, MD;  [facility];  Service: Endoscopy;  Laterality: N/A;   BIOPSY  09/27/2018   Procedure: BIOPSY;  Surgeon: Jackquline Denmark, MD;  [facility];  Service: Endoscopy;;   CATARACT EXTRACTION Left 2014   ESOPHAGOGASTRODUODENOSCOPY (EGD) WITH PROPOFOL N/A 09/27/2018   Procedure: ESOPHAGOGASTRODUODENOSCOPY (EGD) WITH PROPOFOL;  Surgeon: Jackquline Denmark, MD;  [facility];  Service: Endoscopy;  Laterality: N/A;   SHOULDER OPEN ROTATOR CUFF REPAIR  05/22/2011   Procedure: ROTATOR CUFF REPAIR SHOULDER OPEN;  Surgeon: Tobi Bastos, MD;  Location: [facility];  Service: Orthopedics;  Laterality: Left;   THORACOTOMY / DECORTICATION PARIETAL PLEURA Right 204   empyema  TONSILLECTOMY      Allergies  Allergen Reactions   Aricept [Donepezil Hcl] Diarrhea   Crestor [Rosuvastatin Calcium]     Myalgia   Lipitor [Atorvastatin Calcium]    Lovastatin     Myalgia   Micardis [Telmisartan]     Fatigue   Sertraline Diarrhea   Welchol  [Colesevelam Hcl]     Constipation   Zetia [Ezetimibe]     Myalgia   Zocor [Simvastatin]     Myalgia    Allergies as of 02/16/2019      Reactions   Aricept [donepezil Hcl] Diarrhea   Crestor [rosuvastatin Calcium]    Myalgia   Lipitor [atorvastatin Calcium]    Lovastatin    Myalgia   Micardis [telmisartan]    Fatigue   Sertraline Diarrhea   Welchol [colesevelam Hcl]    Constipation   Zetia [ezetimibe]    Myalgia   Zocor [simvastatin]    Myalgia      Medication List       Accurate as of February 16, 2019  3:34 PM. If you have any questions, ask your nurse or doctor.        acetaminophen 325 MG tablet Commonly known as: TYLENOL Take 650 mg by mouth every 6 (six) hours as needed for headache.   alendronate 70 MG tablet Commonly known as: FOSAMAX Take 70 mg by mouth once a week. Take with a full glass of water on an empty stomach.   aspirin EC 81 MG tablet Take 81 mg by mouth daily.   cetirizine 10 MG tablet Commonly known as: ZYRTEC Take 10 mg by mouth daily.   escitalopram 10 MG tablet Commonly known as: LEXAPRO Take 10 mg by mouth daily.   lactose free nutrition Liqd Take 237 mLs by mouth 2 (two) times daily between meals.   levofloxacin 500 MG tablet Commonly known as: LEVAQUIN Take 500 mg by mouth daily.   Liquid Calcium/Vitamin D 600-200 MG-UNIT Caps Generic drug: Calcium Carbonate-Vitamin D Take 1 tablet by mouth.   memantine 28 MG Cp24 24 hr capsule Commonly known as: NAMENDA XR TAKE ONE CAPSULE ONCE A DAY TO PRESERVE MEMORY.   omeprazole 20 MG capsule Commonly known as: PRILOSEC Take 20 mg by mouth daily.   rivastigmine 9.5 mg/24hr Commonly known as: EXELON Apply fresh patch daily and remove old patch to help preserve memory   saccharomyces boulardii 250 MG capsule Commonly known as: FLORASTOR Take 250 mg by mouth 2 (two) times daily.   zinc oxide 20 % ointment Apply 1 application topically as needed for irritation. Apply to  buttocks/peri topical as needed       Review of Systems  Constitutional: Negative for activity change, appetite change, chills, diaphoresis, fatigue and fever.  HENT: Positive for hearing loss. Negative for voice change.   Eyes: Negative for visual disturbance.  Respiratory: Positive for cough. Negative for shortness of breath and wheezing.   Gastrointestinal: Negative for abdominal distention, abdominal pain, constipation, diarrhea, nausea and vomiting.  Genitourinary: Negative for difficulty urinating, dysuria and urgency.  Musculoskeletal: Positive for gait problem.  Skin: Negative for color change and pallor.  Neurological: Negative for dizziness, speech difficulty, weakness and headaches.       Dementia  Psychiatric/Behavioral: Positive for confusion. Negative for agitation, behavioral problems, hallucinations and sleep disturbance. The patient is not nervous/anxious.     Immunization History  Administered Date(s) Administered   Influenza,inj,Quad PF,6+ Mos 12/29/2017   Influenza-Unspecified 12/23/2013, 12/01/2014, 01/11/2016   Pneumococcal Conjugate-13 11/22/2013  Pneumococcal Polysaccharide-23 07/01/2006   Tdap 12/17/2011   Zoster 12/25/2005   Pertinent  Health Maintenance Due  Topic Date Due   FOOT EXAM  12/06/1942   OPHTHALMOLOGY EXAM  12/06/1942   URINE MICROALBUMIN  02/11/2017   INFLUENZA VACCINE  10/31/2018   HEMOGLOBIN A1C  03/28/2019   DEXA SCAN  Completed   PNA vac Low Risk Adult  Completed   Fall Risk  06/17/2018 04/22/2018 01/22/2017 07/23/2016 04/25/2015  Falls in the past year? - 0 No No No  Number falls in past yr: - 0 - - -  Injury with Fall? - 0 - - -  Risk for fall due to : Impaired vision - - - -   Functional Status Survey:    Vitals:   02/16/19 1222  BP: 132/66  Pulse: 63  Resp: 18  Temp: (!) 97.4 F (36.3 C)  SpO2: 97%  Weight: 140 lb (63.5 kg)  Height: 5' (1.524 m)   Body mass index is 27.34 kg/m. Physical Exam Vitals  signs and nursing note reviewed.  Constitutional:      General: She is not in acute distress.    Appearance: Normal appearance. She is not ill-appearing, toxic-appearing or diaphoretic.  HENT:     Head: Normocephalic and atraumatic.     Nose: Nose normal.     Mouth/Throat:     Mouth: Mucous membranes are moist.  Eyes:     Extraocular Movements: Extraocular movements intact.     Conjunctiva/sclera: Conjunctivae normal.     Pupils: Pupils are equal, round, and reactive to light.  Neck:     Musculoskeletal: Normal range of motion.  Cardiovascular:     Rate and Rhythm: Normal rate and regular rhythm.     Heart sounds: No murmur.  Pulmonary:     Breath sounds: No wheezing, rhonchi or rales.  Abdominal:     General: Bowel sounds are normal. There is no distension.     Palpations: Abdomen is soft.     Tenderness: There is no abdominal tenderness. There is no left CVA tenderness, guarding or rebound.  Musculoskeletal:     Right lower leg: No edema.     Left lower leg: No edema.  Skin:    General: Skin is warm and dry.  Neurological:     General: No focal deficit present.     Mental Status: She is alert. Mental status is at baseline.     Cranial Nerves: No cranial nerve deficit.     Sensory: No sensory deficit.     Motor: No weakness.     Coordination: Coordination normal.     Gait: Gait abnormal.     Comments: Oriented to self.   Psychiatric:        Mood and Affect: Mood normal.     Comments: Confused, difficulty memory recalls     Labs reviewed: Recent Labs    09/18/18 0120  09/19/18 0427  09/25/18 0340 09/26/18 0341 09/27/18 0401 09/28/18 0401 10/08/18 01/08/19  NA  --    < > 138   < > 139 140 138 138 142 141  K  --    < > 3.6   < > 3.8 3.9 3.7 3.3* 4.1 4.4  CL  --    < > 105   < > 107 105 105 102 103 104  CO2  --    < > 25   < > 24 23 25 27 30 28   GLUCOSE  --    < >  128*   < > 140* 128* 113* 157*  --   --   BUN  --    < > 5*   < > 6* 9 5* 9 9 23*  CREATININE 0.51    < > 0.46   < > 0.36* 0.47 0.38* 0.55 0.5 0.7  CALCIUM  --    < > 7.5*   < > 8.8* 8.8* 8.0* 8.0* 8.5 9.3  MG 1.9  --  2.1   < > 1.9  --  1.8 2.3  --   --   PHOS 1.7*  --  1.5*  --   --   --   --   --   --   --    < > = values in this interval not displayed.   Recent Labs    09/17/18 1934  09/24/18 0626 09/27/18 0401 01/08/19  AST 48*  --  25 20 17   ALT 29  --  20 16 14   ALKPHOS 168*  --  86 78 54  BILITOT 1.3*  --  0.2* 0.4  --   PROT 8.0  --  6.5 6.4* 6.8  ALBUMIN 2.7*   < > 2.3* 2.3* 3.9   < > = values in this interval not displayed.   Recent Labs    09/25/18 0340 09/26/18 0341 09/28/18 0401 10/08/18 11/05/18 01/08/19  WBC 17.3* 15.2* 16.1* 9.1 8.0 7.2  NEUTROABS 11.7* 9.8* 12.0*  --   --  3,542  HGB 10.0* 10.0* 9.8* 9.1* 10.2* 12.4  HCT 32.9* 32.4* 32.8* 28* 32* 38  MCV 89.6 91.3 90.6  --   --   --   PLT 407* 393 410* 414* 336 298   Lab Results  Component Value Date   TSH 3.01 01/08/2019   Lab Results  Component Value Date   HGBA1C 6.6 (H) 09/26/2018   Lab Results  Component Value Date   CHOL 275 (H) 04/06/2018   HDL 52 04/06/2018   LDLCALC 190 (H) 04/06/2018   TRIG 161 (H) 04/06/2018   CHOLHDL 5.3 (H) 04/06/2018    Significant Diagnostic Results in last 30 days:  No results found.  Assessment/Plan HCAP (healthcare-associated pneumonia) 02/15/19 CXR pulmonary infiltrate is present in the left lung base and small left pleural effusion is present consistent with pneumonia.  Levaquin 500mg  qd x 7 days.   Vascular dementia without behavioral disturbance ([place]) Continue SNF [facility] for safety, care assistance, continue Rivastigmine patch, Memantine for memory.   Dysphagia Continue Omeprazole 20mg  qd.      Family/ staff Communication: plan of care reviewed with the patient and charge nurse.   Labs/tests ordered:  none  Time spend 25 minutes.

## 2019-02-16 NOTE — Assessment & Plan Note (Signed)
02/15/19 CXR pulmonary infiltrate is present in the left lung base and small left pleural effusion is present consistent with pneumonia.  Levaquin 500mg  qd x 7 days.

## 2019-03-02 ENCOUNTER — Non-Acute Institutional Stay (SKILLED_NURSING_FACILITY): Payer: Medicare Other | Admitting: Nurse Practitioner

## 2019-03-02 DIAGNOSIS — F0150 Vascular dementia without behavioral disturbance: Secondary | ICD-10-CM | POA: Diagnosis not present

## 2019-03-02 DIAGNOSIS — F339 Major depressive disorder, recurrent, unspecified: Secondary | ICD-10-CM

## 2019-03-02 DIAGNOSIS — M810 Age-related osteoporosis without current pathological fracture: Secondary | ICD-10-CM | POA: Diagnosis not present

## 2019-03-04 ENCOUNTER — Encounter: Payer: Self-pay | Admitting: Nurse Practitioner

## 2019-03-04 NOTE — Progress Notes (Signed)
Location:   SNF [place] Room Number: 3 Place of Service:  SNF (31) Provider: [facility] Mycheal Veldhuizen NP  [name], MD  Patient Care Team: [name], MD as PCP - General (Internal Medicine) Lindwood Coke, MD as Consulting Physician (Dermatology) Christophe Louis, MD as Consulting Physician (Obstetrics and Gynecology) Latanya Maudlin, MD as Consulting Physician (Orthopedic Surgery) Garlan Fair, MD as Consulting Physician (Gastroenterology) Barnett Abu., MD as Consulting Physician (Cardiology) Neldon Mc Donnamarie Poag, MD as Consulting Physician (Allergy and Immunology) Rexene Alberts, MD as Consulting Physician (Cardiothoracic Surgery) Melida Quitter, MD as Consulting Physician (Otolaryngology) Ngetich, Nelda Bucks, NP as Nurse Practitioner (Family Medicine)  Extended Emergency Contact Information Primary Emergency Contact: Kathie Rhodes of [place] Phone: [phone] Mobile Phone: [phone] Relation: Legal Guardian Secondary Emergency Contact: Leana Gamer States of [place] Phone: [phone] Relation: Uncle  Code Status:  DNR Goals of care: Advanced Directive information Advanced Directives 01/06/2019  Does Patient Have a Medical Advance Directive? Yes  Type of Advance Directive [place]  Does patient want to make changes to medical advance directive? No - Patient declined  Copy of [place] in Chart? Yes - validated most recent copy scanned in chart (See row information)  Pre-existing out of facility DNR order (yellow form or pink MOST form) -     Chief Complaint  Patient presents with  . Medical Management of Chronic Issues    HPI:  Pt is a 85 y.o. female seen today for medical management of chronic diseases.    The patient resides in SNF [facility] for safety, care assistance, taking Rivastigmine 9.5mg /24hr, Memantine 28mg  qd for memory. GERD, stable, on Omeprazole 20mg  qd. Her mood is  stable on Lexapro 10mg  qd. Osteoporosis, taking Ca, Vit D, Alendronate 70mg  wkly.    Past Medical History:  Diagnosis Date  . Abnormal CT scan, chest September 2011   Scar tissue  . Allergic rhinitis   . Alzheimer's dementia ([place])   . Asthma   . Diabetes mellitus    borderline and under control-diet ,exercise  . Esophageal stricture   . Family history of colon cancer    Mother  . GERD (gastroesophageal reflux disease)   . Hiatal hernia   . HLD (hyperlipidemia)   . Hypertension   . Osteopenia   . Pneumonia 12-15 yrs.ago   yrs. ago  . TIA (transient ischemic attack)   . Vitamin D deficiency    Past Surgical History:  Procedure Laterality Date  . ABDOMINAL HYSTERECTOMY  1980   . BALLOON DILATION N/A 09/27/2018   Procedure: BALLOON DILATION;  Surgeon: Jackquline Denmark, MD;  [facility]: [facility];  Service: Endoscopy;  Laterality: N/A;  . BIOPSY  09/27/2018   Procedure: BIOPSY;  Surgeon: Jackquline Denmark, MD;  [facility];  Service: Endoscopy;;  . CATARACT EXTRACTION Left 2014  . ESOPHAGOGASTRODUODENOSCOPY (EGD) WITH PROPOFOL N/A 09/27/2018   Procedure: ESOPHAGOGASTRODUODENOSCOPY (EGD) WITH PROPOFOL;  Surgeon: Jackquline Denmark, MD;  [facility];  Service: Endoscopy;  Laterality: N/A;  . SHOULDER OPEN ROTATOR CUFF REPAIR  05/22/2011   Procedure: ROTATOR CUFF REPAIR SHOULDER OPEN;  Surgeon: Tobi Bastos, MD;  [facility];  Service: Orthopedics;  Laterality: Left;  . THORACOTOMY / DECORTICATION PARIETAL PLEURA Right 204   empyema  . TONSILLECTOMY      Allergies  Allergen Reactions  . Aricept [Donepezil Hcl] Diarrhea  . Crestor [Rosuvastatin Calcium]     Myalgia  . Lipitor [employer]  Calcium]   . Lovastatin     Myalgia  . Micardis [Telmisartan]     Fatigue  . Sertraline Diarrhea  . Welchol [Colesevelam Hcl]     Constipation  . Zetia [Ezetimibe]     Myalgia  . Zocor [Simvastatin]     Myalgia    Allergies as of 03/02/2019      Reactions    Aricept [donepezil Hcl] Diarrhea   Crestor [rosuvastatin Calcium]    Myalgia   Lipitor [atorvastatin Calcium]    Lovastatin    Myalgia   Micardis [telmisartan]    Fatigue   Sertraline Diarrhea   Welchol [colesevelam Hcl]    Constipation   Zetia [ezetimibe]    Myalgia   Zocor [simvastatin]    Myalgia      Medication List       Accurate as of March 02, 2019 11:59 PM. If you have any questions, ask your nurse or doctor.        acetaminophen 325 MG tablet Commonly known as: TYLENOL Take 650 mg by mouth every 6 (six) hours as needed for headache.   alendronate 70 MG tablet Commonly known as: FOSAMAX Take 70 mg by mouth once a week. Take with a full glass of water on an empty stomach.   aspirin EC 81 MG tablet Take 81 mg by mouth daily.   cetirizine 10 MG tablet Commonly known as: ZYRTEC Take 10 mg by mouth daily.   escitalopram 10 MG tablet Commonly known as: LEXAPRO Take 10 mg by mouth daily.   lactose free nutrition Liqd Take 237 mLs by mouth 2 (two) times daily between meals.   Liquid Calcium/Vitamin D 600-200 MG-UNIT Caps Generic drug: Calcium Carbonate-Vitamin D Take 1 tablet by mouth.   memantine 28 MG Cp24 24 hr capsule Commonly known as: NAMENDA XR TAKE ONE CAPSULE ONCE A DAY TO PRESERVE MEMORY.   omeprazole 20 MG capsule Commonly known as: PRILOSEC Take 20 mg by mouth daily.   rivastigmine 9.5 mg/24hr Commonly known as: EXELON Apply fresh patch daily and remove old patch to help preserve memory   saccharomyces boulardii 250 MG capsule Commonly known as: FLORASTOR Take 250 mg by mouth 2 (two) times daily.   zinc oxide 20 % ointment Apply 1 application topically as needed for irritation. Apply to buttocks/peri topical as needed      ROS was provided with assistance of staff.  Review of Systems  Constitutional: Negative for activity change, appetite change, chills, diaphoresis, fatigue and fever.  HENT: Positive for hearing loss. Negative  for congestion and voice change.   Eyes: Negative for visual disturbance.  Respiratory: Negative for cough, shortness of breath and wheezing.   Gastrointestinal: Negative for abdominal distention, abdominal pain, constipation, diarrhea, nausea and vomiting.  Genitourinary: Negative for difficulty urinating, dysuria and urgency.  Musculoskeletal: Positive for gait problem.  Skin: Negative for color change and pallor.  Neurological: Negative for dizziness, speech difficulty, weakness and headaches.       Dementia   Psychiatric/Behavioral: Positive for confusion. Negative for agitation, behavioral problems, hallucinations and sleep disturbance. The patient is not nervous/anxious.     Immunization History  Administered Date(s) Administered  . Influenza,inj,Quad PF,6+ Mos 12/29/2017  . Influenza-Unspecified 12/23/2013, 12/01/2014, 01/11/2016  . Pneumococcal Conjugate-13 11/22/2013  . Pneumococcal Polysaccharide-23 07/01/2006  . Tdap 12/17/2011  . Zoster 12/25/2005   Pertinent  Health Maintenance Due  Topic Date Due  . FOOT EXAM  12/06/1942  . OPHTHALMOLOGY EXAM  12/06/1942  . URINE MICROALBUMIN  02/11/2017  . INFLUENZA VACCINE  10/31/2018  . HEMOGLOBIN A1C  03/28/2019  . DEXA SCAN  Completed  . PNA vac Low Risk Adult  Completed   Fall Risk  06/17/2018 04/22/2018 01/22/2017 07/23/2016 04/25/2015  Falls in the past year? - 0 No No No  Number falls in past yr: - 0 - - -  Injury with Fall? - 0 - - -  Risk for fall due to : Impaired vision - - - -   Functional Status Survey:    Vitals:   03/02/19 1349  BP: (!) 141/77  Pulse: 76  Resp: 18  Temp: (!) 96.2 F (35.7 C)  SpO2: 98%   There is no height or weight on file to calculate BMI. Physical Exam Vitals signs and nursing note reviewed.  Constitutional:      General: She is not in acute distress.    Appearance: Normal appearance. She is not ill-appearing, toxic-appearing or diaphoretic.  HENT:     Head: Normocephalic and  atraumatic.     Nose: Nose normal.     Mouth/Throat:     Mouth: Mucous membranes are moist.  Eyes:     Extraocular Movements: Extraocular movements intact.     Conjunctiva/sclera: Conjunctivae normal.     Pupils: Pupils are equal, round, and reactive to light.  Neck:     Musculoskeletal: Normal range of motion and neck supple.  Cardiovascular:     Rate and Rhythm: Normal rate and regular rhythm.     Heart sounds: No murmur.  Pulmonary:     Breath sounds: No wheezing, rhonchi or rales.  Abdominal:     General: Bowel sounds are normal. There is no distension.     Palpations: Abdomen is soft.     Tenderness: There is no abdominal tenderness. There is no right CVA tenderness, left CVA tenderness, guarding or rebound.  Musculoskeletal:     Right lower leg: No edema.     Left lower leg: No edema.  Skin:    General: Skin is warm and dry.  Neurological:     General: No focal deficit present.     Mental Status: She is alert. Mental status is at baseline.     Motor: No weakness.     Coordination: Coordination normal.     Gait: Gait abnormal.     Comments: Oriented to self  Psychiatric:     Comments: Pleasantly confused.      Labs reviewed: Recent Labs    09/18/18 0120  09/19/18 0427  09/25/18 0340 09/26/18 0341 09/27/18 0401 09/28/18 0401 10/08/18 01/08/19  NA  --    < > 138   < > 139 140 138 138 142 141  K  --    < > 3.6   < > 3.8 3.9 3.7 3.3* 4.1 4.4  CL  --    < > 105   < > 107 105 105 102 103 104  CO2  --    < > 25   < > 24 23 25 27 30 28   GLUCOSE  --    < > 128*   < > 140* 128* 113* 157*  --   --   BUN  --    < > 5*   < > 6* 9 5* 9 9 23*  CREATININE 0.51   < > 0.46   < > 0.36* 0.47 0.38* 0.55 0.5 0.7  CALCIUM  --    < > 7.5*   < > 8.8* 8.8*  8.0* 8.0* 8.5 9.3  MG 1.9  --  2.1   < > 1.9  --  1.8 2.3  --   --   PHOS 1.7*  --  1.5*  --   --   --   --   --   --   --    < > = values in this interval not displayed.   Recent Labs    09/17/18 1934  09/24/18 0626 09/27/18  0401 01/08/19  AST 48*  --  25 20 17   ALT 29  --  20 16 14   ALKPHOS 168*  --  86 78 54  BILITOT 1.3*  --  0.2* 0.4  --   PROT 8.0  --  6.5 6.4* 6.8  ALBUMIN 2.7*   < > 2.3* 2.3* 3.9   < > = values in this interval not displayed.   Recent Labs    09/25/18 0340 09/26/18 0341 09/28/18 0401 10/08/18 11/05/18 01/08/19  WBC 17.3* 15.2* 16.1* 9.1 8.0 7.2  NEUTROABS 11.7* 9.8* 12.0*  --   --  3,542  HGB 10.0* 10.0* 9.8* 9.1* 10.2* 12.4  HCT 32.9* 32.4* 32.8* 28* 32* 38  MCV 89.6 91.3 90.6  --   --   --   PLT 407* 393 410* 414* 336 298   Lab Results  Component Value Date   TSH 3.01 01/08/2019   Lab Results  Component Value Date   HGBA1C 6.6 (H) 09/26/2018   Lab Results  Component Value Date   CHOL 275 (H) 04/06/2018   HDL 52 04/06/2018   LDLCALC 190 (H) 04/06/2018   TRIG 161 (H) 04/06/2018   CHOLHDL 5.3 (H) 04/06/2018    Significant Diagnostic Results in last 30 days:  No results found.  Assessment/Plan Vascular dementia without behavioral disturbance ([place]) Continue SNF [facility] for safety, care assistance, continue Rivastigmine, Memantine for memory.   Depression, recurrent ([place]) Her mood is stable, continue Lexapro.   Age-related osteoporosis without current pathological fracture Stable, continue Vit D, Ca, Alendronate.    Family/ staff Communication: plan of care reviewed with the assistance of staff  Labs/tests ordered: none  Time spend 25 minutes

## 2019-03-04 NOTE — Assessment & Plan Note (Signed)
Her mood is stable, continue Lexapro.  

## 2019-03-04 NOTE — Assessment & Plan Note (Signed)
Stable, continue Vit D, Ca, Alendronate.

## 2019-03-04 NOTE — Assessment & Plan Note (Signed)
Continue SNF [facility] for safety, care assistance, continue Rivastigmine, Memantine for memory.

## 2019-03-06 ENCOUNTER — Telehealth: Payer: Self-pay | Admitting: Internal Medicine

## 2019-03-06 NOTE — Telephone Encounter (Signed)
Received call from [place] at [facility] that pt was eating eggs this am.  Nurse entered the room and pt could not speak, was gray with frothy mucus coming from her lips.  Nurse fortunately performed heimlich maneuver and some eggs came back.  Resident tried to drink ginger ale, but blockage was still present.  Heimlich performed again.  Eggs and ginger ale came up.  She felt better.  VS:  97.5 134/73 and 66.  She was wheezing on nurse exam.  No more problems since.  Apparently has been struggling with her swallowing and just got thru abx that were started 11/16 (levaquin) for pneumonia.  Nurse was going to downgrade diet.  CXR 2 view ordered and ST eval and tx.    Royalty Domagala L. Shakeela Rabadan, D.O. [facility] [address], [place] [address] Cell Phone (Mon-Fri 8am-5pm):  [phone] On Call:  [phone] & follow prompts after 5pm & weekends Office Phone:  [phone] Office Fax:  [phone]

## 2019-03-08 ENCOUNTER — Telehealth: Payer: Self-pay | Admitting: Family

## 2019-03-08 ENCOUNTER — Non-Acute Institutional Stay (SKILLED_NURSING_FACILITY): Payer: Medicare Other | Admitting: Internal Medicine

## 2019-03-08 ENCOUNTER — Encounter: Payer: Self-pay | Admitting: Internal Medicine

## 2019-03-08 DIAGNOSIS — G301 Alzheimer's disease with late onset: Secondary | ICD-10-CM

## 2019-03-08 DIAGNOSIS — J690 Pneumonitis due to inhalation of food and vomit: Secondary | ICD-10-CM

## 2019-03-08 DIAGNOSIS — R1319 Other dysphagia: Secondary | ICD-10-CM

## 2019-03-08 DIAGNOSIS — I10 Essential (primary) hypertension: Secondary | ICD-10-CM | POA: Diagnosis not present

## 2019-03-08 DIAGNOSIS — F0280 Dementia in other diseases classified elsewhere without behavioral disturbance: Secondary | ICD-10-CM

## 2019-03-08 DIAGNOSIS — E785 Hyperlipidemia, unspecified: Secondary | ICD-10-CM

## 2019-03-08 DIAGNOSIS — R1310 Dysphagia, unspecified: Secondary | ICD-10-CM

## 2019-03-08 NOTE — Telephone Encounter (Signed)
Late entry: 03/07/2019 Facility Nurse Charlett Nose call states patient had a choking episode 03/06/2019 Chest X-ray was ordered to rule Aspiration results indicated right lung infiltrate.Nurse states patient recently completed Doxycycline.Vital signs and condition stable.Provider at the facility to follow up 03/08/2019 to evaluate if another round of antibiotics is needed patient post pneumonia treatment.

## 2019-03-08 NOTE — Progress Notes (Signed)
Location[facility] Room Number: 3 Place of Service:  SNF (31) Provider: [name], MD   [name], MD  Patient Care Team: [name], MD as PCP - General (Internal Medicine) Lindwood Coke, MD as Consulting Physician (Dermatology) Christophe Louis, MD as Consulting Physician (Obstetrics and Gynecology) Latanya Maudlin, MD as Consulting Physician (Orthopedic Surgery) Garlan Fair, MD as Consulting Physician (Gastroenterology) Barnett Abu., MD as Consulting Physician (Cardiology) Neldon Mc Donnamarie Poag, MD as Consulting Physician (Allergy and Immunology) Rexene Alberts, MD as Consulting Physician (Cardiothoracic Surgery) Melida Quitter, MD as Consulting Physician (Otolaryngology) Ngetich, Nelda Bucks, NP as Nurse Practitioner (Family Medicine)  Extended Emergency Contact Information Primary Emergency Contact: Kathie Rhodes of [place] Phone: [phone] Mobile Phone: [phone] Relation: Legal Guardian Secondary Emergency Contact: Leana Gamer [place] of [place] Phone: [phone] Relation: Uncle  Code Status:  DNR Goals of care: Advanced Directive information Advanced Directives 01/06/2019  Does Patient Have a Medical Advance Directive? Yes  Type of Advance Directive [place]  Does patient want to make changes to medical advance directive? No - Patient declined  Copy of [place] in Chart? Yes - validated most recent copy scanned in chart (See row information)  Pre-existing out of facility DNR order (yellow form or pink MOST form) -     Chief Complaint  Patient presents with  . Acute Visit    Cough and possible pneumonia     HPI:  Pt is a 85 y.o. female seen today for an acute visit for Cough Choking and Possible Pneumonia  Patient has h/o Hyperlipidemia, Osteoporosis, Allergic Rhinitis and Dementia Alzheimer, Dysphagia s/p Dilatation 6/20  Patient was eating  breakfast few days ago and choked on her Eggs. Needing Heimlich.She was recently treated with Levaquin for Left Lower Lobe infiltrate. Since she was wheezing and had SOB after this episode Chest Xray was done. It showed right middle lobe infiltrate.  Patient seen today as she continues to be lethargic fatigue.  No fever.  Does have cough.  She is eating.  Has not had any more episodes of choking   Past Medical History:  Diagnosis Date  . Abnormal CT scan, chest September 2011   Scar tissue  . Allergic rhinitis   . Alzheimer's dementia ([place])   . Asthma   . Diabetes mellitus    borderline and under control-diet ,exercise  . Esophageal stricture   . Family history of colon cancer    Mother  . GERD (gastroesophageal reflux disease)   . Hiatal hernia   . HLD (hyperlipidemia)   . Hypertension   . Osteopenia   . Pneumonia 12-15 yrs.ago   yrs. ago  . TIA (transient ischemic attack)   . Vitamin D deficiency    Past Surgical History:  Procedure Laterality Date  . ABDOMINAL HYSTERECTOMY  1980   . BALLOON DILATION N/A 09/27/2018   Procedure: BALLOON DILATION;  Surgeon: Jackquline Denmark, MD;  [facility];  Service: Endoscopy;  Laterality: N/A;  . BIOPSY  09/27/2018   Procedure: BIOPSY;  Surgeon: Jackquline Denmark, MD;  [facility];  Service: Endoscopy;;  . CATARACT EXTRACTION Left 2014  . ESOPHAGOGASTRODUODENOSCOPY (EGD) WITH PROPOFOL N/A 09/27/2018   Procedure: ESOPHAGOGASTRODUODENOSCOPY (EGD) WITH PROPOFOL;  Surgeon: Jackquline Denmark, MD;  [facility];  Service: Endoscopy;  Laterality: N/A;  . SHOULDER OPEN ROTATOR CUFF REPAIR  05/22/2011   Procedure: ROTATOR CUFF REPAIR SHOULDER OPEN;  Surgeon: Jori Moll  Fransico Setters, MD;  [facility];  Service: Orthopedics;  Laterality: Left;  . THORACOTOMY / DECORTICATION PARIETAL PLEURA Right 204   empyema  . TONSILLECTOMY      Allergies  Allergen Reactions  . Aricept [Donepezil Hcl] Diarrhea  . Crestor [Rosuvastatin Calcium]      Myalgia  . Lipitor [Atorvastatin Calcium]   . Lovastatin     Myalgia  . Micardis [Telmisartan]     Fatigue  . Sertraline Diarrhea  . Welchol [Colesevelam Hcl]     Constipation  . Zetia [Ezetimibe]     Myalgia  . Zocor [Simvastatin]     Myalgia    Allergies as of 03/08/2019      Reactions   Aricept [donepezil Hcl] Diarrhea   Crestor [rosuvastatin Calcium]    Myalgia   Lipitor [atorvastatin Calcium]    Lovastatin    Myalgia   Micardis [telmisartan]    Fatigue   Sertraline Diarrhea   Welchol [colesevelam Hcl]    Constipation   Zetia [ezetimibe]    Myalgia   Zocor [simvastatin]    Myalgia      Medication List       Accurate as of March 08, 2019  9:59 AM. If you have any questions, ask your nurse or doctor.        STOP taking these medications   saccharomyces boulardii 250 MG capsule Commonly known as: FLORASTOR Stopped by: [name], MD     TAKE these medications   acetaminophen 325 MG tablet Commonly known as: TYLENOL Take 650 mg by mouth every 6 (six) hours as needed for headache.   alendronate 70 MG tablet Commonly known as: FOSAMAX Take 70 mg by mouth once a week. Take with a full glass of water on an empty stomach.   aspirin EC 81 MG tablet Take 81 mg by mouth daily.   cetirizine 10 MG tablet Commonly known as: ZYRTEC Take 10 mg by mouth daily.   escitalopram 10 MG tablet Commonly known as: LEXAPRO Take 10 mg by mouth daily.   lactose free nutrition Liqd Take 237 mLs by mouth 2 (two) times daily between meals.   Liquid Calcium/Vitamin D 600-200 MG-UNIT Caps Generic drug: Calcium Carbonate-Vitamin D Take 1 tablet by mouth.   memantine 28 MG Cp24 24 hr capsule Commonly known as: NAMENDA XR TAKE ONE CAPSULE ONCE A DAY TO PRESERVE MEMORY.   omeprazole 20 MG capsule Commonly known as: PRILOSEC Take 20 mg by mouth daily.   rivastigmine 9.5 mg/24hr Commonly known as: EXELON Apply fresh patch daily and remove old patch to help  preserve memory   zinc oxide 20 % ointment Apply 1 application topically as needed for irritation. Apply to buttocks/peri topical as needed       Review of Systems  Unable to perform ROS: Dementia    Immunization History  Administered Date(s) Administered  . Influenza,inj,Quad PF,6+ Mos 12/29/2017  . Influenza-Unspecified 12/23/2013, 12/01/2014, 01/11/2016  . Pneumococcal Conjugate-13 11/22/2013  . Pneumococcal Polysaccharide-23 07/01/2006  . Tdap 12/17/2011  . Zoster 12/25/2005   Pertinent  Health Maintenance Due  Topic Date Due  . FOOT EXAM  12/06/1942  . OPHTHALMOLOGY EXAM  12/06/1942  . URINE MICROALBUMIN  02/11/2017  . INFLUENZA VACCINE  10/31/2018  . HEMOGLOBIN A1C  03/28/2019  . DEXA SCAN  Completed  . PNA vac Low Risk Adult  Completed   Fall Risk  06/17/2018 04/22/2018 01/22/2017 07/23/2016 04/25/2015  Falls in the past year? - 0 No No No  Number falls in past yr: - 0 - - -  Injury with Fall? - 0 - - -  Risk for fall due to : Impaired vision - - - -   Functional Status Survey:    Vitals:   03/08/19 0955  BP: 113/60  Pulse: (!) 58  Resp: 20  Temp: 97.7 F (36.5 C)  SpO2: 95%  Weight: 141 lb (64 kg)  Height: 5' (1.524 m)   Body mass index is 27.54 kg/m. Physical Exam Vitals signs reviewed.  Constitutional:      Appearance: Normal appearance.  HENT:     Head: Normocephalic.     Nose: Nose normal.     Mouth/Throat:     Mouth: Mucous membranes are moist.     Pharynx: Oropharynx is clear.  Eyes:     Pupils: Pupils are equal, round, and reactive to light.  Neck:     Musculoskeletal: Neck supple.  Cardiovascular:     Rate and Rhythm: Normal rate and regular rhythm.     Pulses: Normal pulses.  Pulmonary:     Effort: Pulmonary effort is normal. No respiratory distress.     Breath sounds: Normal breath sounds. No wheezing or rales.  Abdominal:     General: Abdomen is flat. Bowel sounds are normal.     Palpations: Abdomen is soft.  Musculoskeletal:         General: No swelling.  Skin:    General: Skin is warm.  Neurological:     General: No focal deficit present.     Mental Status: She is alert.  Psychiatric:        Mood and Affect: Mood normal.        Thought Content: Thought content normal.     Labs reviewed: Recent Labs    09/18/18 0120  09/19/18 0427  09/25/18 0340 09/26/18 0341 09/27/18 0401 09/28/18 0401 10/08/18 01/08/19  NA  --    < > 138   < > 139 140 138 138 142 141  K  --    < > 3.6   < > 3.8 3.9 3.7 3.3* 4.1 4.4  CL  --    < > 105   < > 107 105 105 102 103 104  CO2  --    < > 25   < > 24 23 25 27 30 28   GLUCOSE  --    < > 128*   < > 140* 128* 113* 157*  --   --   BUN  --    < > 5*   < > 6* 9 5* 9 9 23*  CREATININE 0.51   < > 0.46   < > 0.36* 0.47 0.38* 0.55 0.5 0.7  CALCIUM  --    < > 7.5*   < > 8.8* 8.8* 8.0* 8.0* 8.5 9.3  MG 1.9  --  2.1   < > 1.9  --  1.8 2.3  --   --   PHOS 1.7*  --  1.5*  --   --   --   --   --   --   --    < > = values in this interval not displayed.   Recent Labs    09/17/18 1934  09/24/18 0626 09/27/18 0401 01/08/19  AST 48*  --  25 20 17   ALT 29  --  20 16 14   ALKPHOS 168*  --  86 78 54  BILITOT 1.3*  --  0.2* 0.4  --  PROT 8.0  --  6.5 6.4* 6.8  ALBUMIN 2.7*   < > 2.3* 2.3* 3.9   < > = values in this interval not displayed.   Recent Labs    09/25/18 0340 09/26/18 0341 09/28/18 0401 10/08/18 11/05/18 01/08/19  WBC 17.3* 15.2* 16.1* 9.1 8.0 7.2  NEUTROABS 11.7* 9.8* 12.0*  --   --  3,542  HGB 10.0* 10.0* 9.8* 9.1* 10.2* 12.4  HCT 32.9* 32.4* 32.8* 28* 32* 38  MCV 89.6 91.3 90.6  --   --   --   PLT 407* 393 410* 414* 336 298   Lab Results  Component Value Date   TSH 3.01 01/08/2019   Lab Results  Component Value Date   HGBA1C 6.6 (H) 09/26/2018   Lab Results  Component Value Date   CHOL 275 (H) 04/06/2018   HDL 52 04/06/2018   LDLCALC 190 (H) 04/06/2018   TRIG 161 (H) 04/06/2018   CHOLHDL 5.3 (H) 04/06/2018    Significant Diagnostic Results in last 30  days:  No results found.  Assessment/Plan Aspiration pneumonia of right middle lobe,   Will start her on  Augmentin 500 mg For 7 days   Esophageal dysphagia Speech to evaluate Will eat supervised S/P Dilatation in past If Continues Repeat GI follow up  Late onset Alzheimer's disease without behavioral disturbance ([place]) On Namenda and Exelon Patch Dependent for her ADLS  Hyperlipidemia, unspecified hyperlipidemia type ? Allergic to statin Would not treat her     Family/ staff Communication:   Labs/tests ordered:   Total time spent in this patient care encounter was  25_  minutes; greater than 50% of the visit spent counseling patient and staff, reviewing records , Labs and coordinating care for problems addressed at this encounter.

## 2019-03-25 ENCOUNTER — Non-Acute Institutional Stay (SKILLED_NURSING_FACILITY): Payer: Medicare Other | Admitting: Nurse Practitioner

## 2019-03-25 ENCOUNTER — Encounter: Payer: Self-pay | Admitting: Nurse Practitioner

## 2019-03-25 DIAGNOSIS — M810 Age-related osteoporosis without current pathological fracture: Secondary | ICD-10-CM

## 2019-03-25 DIAGNOSIS — F0280 Dementia in other diseases classified elsewhere without behavioral disturbance: Secondary | ICD-10-CM

## 2019-03-25 DIAGNOSIS — K222 Esophageal obstruction: Secondary | ICD-10-CM | POA: Diagnosis not present

## 2019-03-25 DIAGNOSIS — G301 Alzheimer's disease with late onset: Secondary | ICD-10-CM | POA: Diagnosis not present

## 2019-03-25 DIAGNOSIS — F339 Major depressive disorder, recurrent, unspecified: Secondary | ICD-10-CM

## 2019-03-25 DIAGNOSIS — R7303 Prediabetes: Secondary | ICD-10-CM

## 2019-03-25 NOTE — Progress Notes (Addendum)
Location:   [facility] Room Number: 3 Place of Service:  SNF (31) Provider:  Teia Freitas NP  [name], MD  Patient Care Team: [name], MD as PCP - General (Internal Medicine) Lindwood Coke, MD as Consulting Physician (Dermatology) Christophe Louis, MD as Consulting Physician (Obstetrics and Gynecology) Latanya Maudlin, MD as Consulting Physician (Orthopedic Surgery) Garlan Fair, MD as Consulting Physician (Gastroenterology) Barnett Abu., MD as Consulting Physician (Cardiology) Neldon Mc Donnamarie Poag, MD as Consulting Physician (Allergy and Immunology) Rexene Alberts, MD as Consulting Physician (Cardiothoracic Surgery) Melida Quitter, MD as Consulting Physician (Otolaryngology) Ngetich, Nelda Bucks, NP as Nurse Practitioner (Family Medicine)  Extended Emergency Contact Information Primary Emergency Contact: Kathie Rhodes of [place] Phone: [phone] Mobile Phone: [phone] Relation: Legal Guardian Secondary Emergency Contact: Leana Gamer [place] of [place] Phone: [phone] Relation: Uncle  Code Status:  DNR Goals of care: Advanced Directive information Advanced Directives 01/06/2019  Does Patient Have a Medical Advance Directive? Yes  Type of Advance Directive [place]  Does patient want to make changes to medical advance directive? No - Patient declined  Copy of [place] in Chart? Yes - validated most recent copy scanned in chart (See row information)  Pre-existing out of facility DNR order (yellow form or pink MOST form) -     Chief Complaint  Patient presents with  . Medical Management of Chronic Issues  . Health Maintenance    Foot and eye exam    HPI:  Pt is a 85 y.o. female seen today for medical management of chronic diseases.    The patient resides in SNF [facility] for safety, care assistance, on Rivastigamine patch, Memantine 28mg  qd for memory. Her  mood is stable, on Escitalopram 10mg  qd. Hx of GERD, stable, on Omeprazole 20mg  qd, treated for aspiration PNA with Levquin for left lower lobe infiltrate 3 weeks ago, s/p dilatation for esophageal strictures. Osteoporosis, on Ca, Vit D, Alendronate wkly.    Past Medical History:  Diagnosis Date  . Abnormal CT scan, chest September 2011   Scar tissue  . Allergic rhinitis   . Alzheimer's dementia ([place])   . Asthma   . Diabetes mellitus    borderline and under control-diet ,exercise  . Esophageal stricture   . Family history of colon cancer    Mother  . GERD (gastroesophageal reflux disease)   . Hiatal hernia   . HLD (hyperlipidemia)   . Hypertension   . Osteopenia   . Pneumonia 12-15 yrs.ago   yrs. ago  . TIA (transient ischemic attack)   . Vitamin D deficiency    Past Surgical History:  Procedure Laterality Date  . ABDOMINAL HYSTERECTOMY  1980   . BALLOON DILATION N/A 09/27/2018   Procedure: BALLOON DILATION;  Surgeon: Jackquline Denmark, MD;  [facility];  Service: Endoscopy;  Laterality: N/A;  . BIOPSY  09/27/2018   Procedure: BIOPSY;  Surgeon: Jackquline Denmark, MD;  [facility];  Service: Endoscopy;;  . CATARACT EXTRACTION Left 2014  . ESOPHAGOGASTRODUODENOSCOPY (EGD) WITH PROPOFOL N/A 09/27/2018   Procedure: ESOPHAGOGASTRODUODENOSCOPY (EGD) WITH PROPOFOL;  Surgeon: Jackquline Denmark, MD;  [facility];  Service: Endoscopy;  Laterality: N/A;  . SHOULDER OPEN ROTATOR CUFF REPAIR  05/22/2011   Procedure: ROTATOR CUFF REPAIR SHOULDER OPEN;  Surgeon: Tobi Bastos, MD;  [facility];  Service: Orthopedics;  Laterality: Left;  . THORACOTOMY / DECORTICATION PARIETAL PLEURA Right 204  empyema  . TONSILLECTOMY      Allergies  Allergen Reactions  . Aricept [Donepezil Hcl] Diarrhea  . Crestor [Rosuvastatin Calcium]     Myalgia  . Lipitor [Atorvastatin Calcium]   . Lovastatin     Myalgia  . Micardis [Telmisartan]     Fatigue  . Sertraline Diarrhea    . Welchol [Colesevelam Hcl]     Constipation  . Zetia [Ezetimibe]     Myalgia  . Zocor [Simvastatin]     Myalgia    Allergies as of 03/25/2019      Reactions   Aricept [donepezil Hcl] Diarrhea   Crestor [rosuvastatin Calcium]    Myalgia   Lipitor [atorvastatin Calcium]    Lovastatin    Myalgia   Micardis [telmisartan]    Fatigue   Sertraline Diarrhea   Welchol [colesevelam Hcl]    Constipation   Zetia [ezetimibe]    Myalgia   Zocor [simvastatin]    Myalgia      Medication List       Accurate as of March 25, 2019 11:59 PM. If you have any questions, ask your nurse or doctor.        acetaminophen 325 MG tablet Commonly known as: TYLENOL Take 650 mg by mouth every 6 (six) hours as needed for headache.   alendronate 70 MG tablet Commonly known as: FOSAMAX Take 70 mg by mouth once a week. Take with a full glass of water on an empty stomach.   aspirin EC 81 MG tablet Take 81 mg by mouth daily.   cetirizine 10 MG tablet Commonly known as: ZYRTEC Take 10 mg by mouth daily.   escitalopram 10 MG tablet Commonly known as: LEXAPRO Take 10 mg by mouth daily.   lactose free nutrition Liqd Take 237 mLs by mouth 2 (two) times daily between meals.   Liquid Calcium/Vitamin D 600-200 MG-UNIT Caps Generic drug: Calcium Carbonate-Vitamin D Take 1 tablet by mouth.   memantine 28 MG Cp24 24 hr capsule Commonly known as: NAMENDA XR TAKE ONE CAPSULE ONCE A DAY TO PRESERVE MEMORY.   omeprazole 20 MG capsule Commonly known as: PRILOSEC Take 20 mg by mouth daily.   rivastigmine 9.5 mg/24hr Commonly known as: EXELON Apply fresh patch daily and remove old patch to help preserve memory   zinc oxide 20 % ointment Apply 1 application topically as needed for irritation. Apply to buttocks/peri topical as needed      ROS was provided with assistance of staff.  Review of Systems  Constitutional: Negative for activity change, appetite change, chills, diaphoresis,  fatigue, fever and unexpected weight change.  HENT: Positive for hearing loss. Negative for congestion and voice change.   Respiratory: Negative for cough, shortness of breath and wheezing.   Cardiovascular: Negative for chest pain, palpitations and leg swelling.  Gastrointestinal: Negative for abdominal distention, constipation, diarrhea, nausea and vomiting.  Genitourinary: Negative for difficulty urinating, dysuria and urgency.  Musculoskeletal: Positive for gait problem.  Skin: Negative for color change and pallor.  Neurological: Negative for dizziness, speech difficulty, weakness and headaches.       Dementia.   Psychiatric/Behavioral: Positive for confusion. Negative for agitation, behavioral problems, hallucinations and sleep disturbance. The patient is not nervous/anxious.     Immunization History  Administered Date(s) Administered  . Influenza, High Dose Seasonal PF 01/14/2019  . Influenza,inj,Quad PF,6+ Mos 12/29/2017  . Influenza-Unspecified 12/23/2013, 12/01/2014, 01/11/2016  . Pneumococcal Conjugate-13 11/22/2013  . Pneumococcal Polysaccharide-23 07/01/2006  . Tdap 12/17/2011  . Zoster 12/25/2005  Pertinent  Health Maintenance Due  Topic Date Due  . FOOT EXAM  12/06/1942  . OPHTHALMOLOGY EXAM  12/06/1942  . URINE MICROALBUMIN  02/11/2017  . HEMOGLOBIN A1C  03/28/2019  . INFLUENZA VACCINE  Completed  . DEXA SCAN  Completed  . PNA vac Low Risk Adult  Completed   Fall Risk  06/17/2018 04/22/2018 01/22/2017 07/23/2016 04/25/2015  Falls in the past year? - 0 No No No  Number falls in past yr: - 0 - - -  Injury with Fall? - 0 - - -  Risk for fall due to : Impaired vision - - - -   Functional Status Survey:    Vitals:   03/25/19 0843  BP: 136/71  Pulse: 70  Resp: 18  Temp: 97.8 F (36.6 C)  SpO2: 93%  Weight: 141 lb (64 kg)  Height: 5' (1.524 m)   Body mass index is 27.54 kg/m. Physical Exam Vitals and nursing note reviewed.  Constitutional:      General:  She is not in acute distress.    Appearance: Normal appearance. She is not ill-appearing, toxic-appearing or diaphoretic.  HENT:     Head: Normocephalic and atraumatic.     Nose: Nose normal.     Mouth/Throat:     Mouth: Mucous membranes are moist.  Eyes:     Extraocular Movements: Extraocular movements intact.     Conjunctiva/sclera: Conjunctivae normal.     Pupils: Pupils are equal, round, and reactive to light.  Cardiovascular:     Rate and Rhythm: Normal rate and regular rhythm.     Heart sounds: No murmur.  Pulmonary:     Breath sounds: No wheezing, rhonchi or rales.  Abdominal:     General: Bowel sounds are normal. There is no distension.     Palpations: Abdomen is soft.     Tenderness: There is no abdominal tenderness. There is no right CVA tenderness, left CVA tenderness, guarding or rebound.  Musculoskeletal:     Cervical back: Normal range of motion and neck supple.     Right lower leg: No edema.     Left lower leg: No edema.  Skin:    General: Skin is warm and dry.  Neurological:     General: No focal deficit present.     Mental Status: She is alert. Mental status is at baseline.     Motor: No weakness.     Coordination: Coordination normal.     Gait: Gait abnormal.     Comments: Oriented to self.   Psychiatric:        Mood and Affect: Mood normal.        Behavior: Behavior normal.     Labs reviewed: Recent Labs    09/18/18 0120 09/19/18 0427 09/20/18 0338 09/25/18 0340 09/26/18 0341 09/27/18 0401 09/28/18 0401 10/08/18 0000 01/08/19 0000  NA  --  138  --  139 140 138 138 142 141  K  --  3.6  --  3.8 3.9 3.7 3.3* 4.1 4.4  CL  --  105  --  107 105 105 102 103 104  CO2  --  25  --  24 23 25 27 30 28   GLUCOSE  --  128*  --  140* 128* 113* 157*  --   --   BUN  --  5*  --  6* 9 5* 9 9 23*  CREATININE 0.51 0.46  --  0.36* 0.47 0.38* 0.55 0.5 0.7  CALCIUM  --  7.5*  --  8.8* 8.8* 8.0* 8.0* 8.5 9.3  MG 1.9 2.1   < > 1.9  --  1.8 2.3  --   --   PHOS 1.7*  1.5*  --   --   --   --   --   --   --    < > = values in this interval not displayed.   Recent Labs    09/17/18 1934 09/24/18 0626 09/27/18 0401 01/08/19 0000  AST 48* 25 20 17   ALT 29 20 16 14   ALKPHOS 168* 86 78 54  BILITOT 1.3* 0.2* 0.4  --   PROT 8.0 6.5 6.4* 6.8  ALBUMIN 2.7* 2.3* 2.3* 3.9   Recent Labs    09/25/18 0340 09/26/18 0341 09/28/18 0401 10/08/18 0000 11/05/18 0000 01/08/19 0000  WBC 17.3* 15.2* 16.1* 9.1 8.0 7.2  NEUTROABS 11.7* 9.8* 12.0*  --   --  3,542  HGB 10.0* 10.0* 9.8* 9.1* 10.2* 12.4  HCT 32.9* 32.4* 32.8* 28* 32* 38  MCV 89.6 91.3 90.6  --   --   --   PLT 407* 393 410* 414* 336 298   Lab Results  Component Value Date   TSH 3.01 01/08/2019   Lab Results  Component Value Date   HGBA1C 6.6 (H) 09/26/2018   Lab Results  Component Value Date   CHOL 275 (H) 04/06/2018   HDL 52 04/06/2018   LDLCALC 190 (H) 04/06/2018   TRIG 161 (H) 04/06/2018   CHOLHDL 5.3 (H) 04/06/2018    Significant Diagnostic Results in last 30 days:  No results found.  Assessment/Plan Esophageal stricture 08/2018 esophageal dilatation. Treated for aspiration PNA left lower lobe infiltrate with levaquin about 3 weeks ago. Stable, continue Omeprazole.   Alzheimer's dementia Continue SNF [facility] for safety, care assistance, continue Memantine, Rivastigmine.   Depression, recurrent ([place]) Her mood is stable, continue Escitalopram.   Age-related osteoporosis without current pathological fracture Stable, continue Vit D, Ca, Alendronate.      Family/ staff Communication: plan of care reviewed with the patient and charge nurse.   Labs/tests ordered:  none  Time spend 25 minutes.

## 2019-03-29 ENCOUNTER — Encounter: Payer: Self-pay | Admitting: Nurse Practitioner

## 2019-03-29 NOTE — Assessment & Plan Note (Signed)
Her mood is stable, continue Escitalopram.

## 2019-03-29 NOTE — Assessment & Plan Note (Signed)
Stable, continue Vit D, Ca, Alendronate.

## 2019-03-29 NOTE — Assessment & Plan Note (Addendum)
08/2018 esophageal dilatation. Treated for aspiration PNA left lower lobe infiltrate with levaquin about 3 weeks ago. Stable, continue Omeprazole.

## 2019-03-29 NOTE — Assessment & Plan Note (Signed)
Continue SNF [facility] for safety, care assistance, continue Memantine, Rivastigmine.

## 2019-04-21 ENCOUNTER — Non-Acute Institutional Stay (SKILLED_NURSING_FACILITY): Payer: Medicare PPO | Admitting: Internal Medicine

## 2019-04-21 ENCOUNTER — Encounter: Payer: Self-pay | Admitting: Internal Medicine

## 2019-04-21 DIAGNOSIS — I10 Essential (primary) hypertension: Secondary | ICD-10-CM | POA: Diagnosis not present

## 2019-04-21 DIAGNOSIS — F0280 Dementia in other diseases classified elsewhere without behavioral disturbance: Secondary | ICD-10-CM

## 2019-04-21 DIAGNOSIS — G301 Alzheimer's disease with late onset: Secondary | ICD-10-CM | POA: Diagnosis not present

## 2019-04-21 DIAGNOSIS — E785 Hyperlipidemia, unspecified: Secondary | ICD-10-CM

## 2019-04-21 DIAGNOSIS — R1319 Other dysphagia: Secondary | ICD-10-CM

## 2019-04-21 DIAGNOSIS — R1310 Dysphagia, unspecified: Secondary | ICD-10-CM | POA: Diagnosis not present

## 2019-04-21 NOTE — Progress Notes (Signed)
Location:   [facility] Room Number: 3 Place of Service:  SNF [phone]) Provider: [name], MD  [name], MD  Patient Care Team: [name], MD as PCP - General (Internal Medicine) Lindwood Coke, MD as Consulting Physician (Dermatology) Christophe Louis, MD as Consulting Physician (Obstetrics and Gynecology) Latanya Maudlin, MD as Consulting Physician (Orthopedic Surgery) Garlan Fair, MD as Consulting Physician (Gastroenterology) Barnett Abu., MD as Consulting Physician (Cardiology) Neldon Mc Donnamarie Poag, MD as Consulting Physician (Allergy and Immunology) Rexene Alberts, MD as Consulting Physician (Cardiothoracic Surgery) Melida Quitter, MD as Consulting Physician (Otolaryngology) Ngetich, Nelda Bucks, NP as Nurse Practitioner (Family Medicine)  Extended Emergency Contact Information Primary Emergency Contact: Kathie Rhodes of [place] Phone: [phone] Mobile Phone: [phone] Relation: Legal Guardian Secondary Emergency Contact: Leana Gamer [place] of [place] Phone: [phone] Relation: Uncle  Code Status:  DNR Goals of care: Advanced Directive information Advanced Directives 04/21/2019  Does Patient Have a Medical Advance Directive? Yes  Type of Paramedic of [place];Living will;Out of facility DNR (pink MOST or yellow form)  Does patient want to make changes to medical advance directive? No - Patient declined  Copy of [place] in Chart? Yes - validated most recent copy scanned in chart (See row information)  Pre-existing out of facility DNR order (yellow form or pink MOST form) Pink MOST form placed in chart (order not valid for inpatient use)     Chief Complaint  Patient presents with  . Medical Management of Chronic Issues  . Health Maintenance    Foot and eye exam, urine microalbumin, hemoglobin A1C    HPI:  Pt is a 86 y.o. female seen  today for medical management of chronic diseases.    Patient has h/o Hyperlipidemia, Osteoporosis, Allergic Rhinitis and Dementia Alzheimer, Dysphagia s/p Dilatation 6/20  Patient has not had any choking Episodes for Past few weeks. Nurses keep a close monitoring on her. Fosamax was discontinued again due to her Episodes. Continue to do well otherwise. No Acute Complains. Weight is stable. Walks with no assists. No Falls.  Past Medical History:  Diagnosis Date  . Abnormal CT scan, chest September 2011   Scar tissue  . Allergic rhinitis   . Alzheimer's dementia ([place])   . Asthma   . Diabetes mellitus    borderline and under control-diet ,exercise  . Esophageal stricture   . Family history of colon cancer    Mother  . GERD (gastroesophageal reflux disease)   . Hiatal hernia   . HLD (hyperlipidemia)   . Hypertension   . Osteopenia   . Pneumonia 12-15 yrs.ago   yrs. ago  . TIA (transient ischemic attack)   . Vitamin D deficiency    Past Surgical History:  Procedure Laterality Date  . ABDOMINAL HYSTERECTOMY  1980   . BALLOON DILATION N/A 09/27/2018   Procedure: BALLOON DILATION;  Surgeon: Jackquline Denmark, MD;  [facility];  Service: Endoscopy;  Laterality: N/A;  . BIOPSY  09/27/2018   Procedure: BIOPSY;  Surgeon: Jackquline Denmark, MD;  [facility];  Service: Endoscopy;;  . CATARACT EXTRACTION Left 2014  . ESOPHAGOGASTRODUODENOSCOPY (EGD) WITH PROPOFOL N/A 09/27/2018   Procedure: ESOPHAGOGASTRODUODENOSCOPY (EGD) WITH PROPOFOL;  Surgeon: Jackquline Denmark, MD;  [facility];  Service: Endoscopy;  Laterality: N/A;  . SHOULDER OPEN ROTATOR CUFF REPAIR  05/22/2011   Procedure: ROTATOR CUFF REPAIR SHOULDER OPEN;  Surgeon: Tobi Bastos, MD;  [facility];  Service: Orthopedics;  Laterality: Left;  . THORACOTOMY / DECORTICATION PARIETAL PLEURA Right 204   empyema  . TONSILLECTOMY      Allergies  Allergen Reactions  . Aricept [Donepezil Hcl] Diarrhea  .  Crestor [Rosuvastatin Calcium]     Myalgia  . Lipitor [Atorvastatin Calcium]   . Lovastatin     Myalgia  . Micardis [Telmisartan]     Fatigue  . Sertraline Diarrhea  . Welchol [Colesevelam Hcl]     Constipation  . Zetia [Ezetimibe]     Myalgia  . Zocor [Simvastatin]     Myalgia    Allergies as of 04/21/2019      Reactions   Aricept [donepezil Hcl] Diarrhea   Crestor [rosuvastatin Calcium]    Myalgia   Lipitor [atorvastatin Calcium]    Lovastatin    Myalgia   Micardis [telmisartan]    Fatigue   Sertraline Diarrhea   Welchol [colesevelam Hcl]    Constipation   Zetia [ezetimibe]    Myalgia   Zocor [simvastatin]    Myalgia      Medication List       Accurate as of April 21, 2019  9:27 AM. If you have any questions, ask your nurse or doctor.        STOP taking these medications   alendronate 70 MG tablet Commonly known as: FOSAMAX Stopped by: [name], MD     TAKE these medications   acetaminophen 325 MG tablet Commonly known as: TYLENOL Take 650 mg by mouth every 6 (six) hours as needed for headache.   aspirin EC 81 MG tablet Take 81 mg by mouth daily.   cetirizine 10 MG tablet Commonly known as: ZYRTEC Take 10 mg by mouth daily.   escitalopram 10 MG tablet Commonly known as: LEXAPRO Take 10 mg by mouth daily.   lactose free nutrition Liqd Take 237 mLs by mouth 2 (two) times daily between meals.   Liquid Calcium/Vitamin D 600-200 MG-UNIT Caps Generic drug: Calcium Carbonate-Vitamin D Take 1 tablet by mouth.   memantine 28 MG Cp24 24 hr capsule Commonly known as: NAMENDA XR TAKE ONE CAPSULE ONCE A DAY TO PRESERVE MEMORY.   omeprazole 20 MG capsule Commonly known as: PRILOSEC Take 20 mg by mouth daily.   rivastigmine 9.5 mg/24hr Commonly known as: EXELON Apply fresh patch daily and remove old patch to help preserve memory   zinc oxide 20 % ointment Apply 1 application topically as needed for irritation. Apply to buttocks/peri  topical as needed       Review of Systems  Unable to perform ROS: Dementia    Immunization History  Administered Date(s) Administered  . Influenza, High Dose Seasonal PF 01/14/2019  . Influenza,inj,Quad PF,6+ Mos 12/29/2017  . Influenza-Unspecified 12/23/2013, 12/01/2014, 01/11/2016  . Pneumococcal Conjugate-13 11/22/2013  . Pneumococcal Polysaccharide-23 07/01/2006  . Tdap 12/17/2011  . Zoster 12/25/2005   Pertinent  Health Maintenance Due  Topic Date Due  . FOOT EXAM  12/06/1942  . OPHTHALMOLOGY EXAM  12/06/1942  . URINE MICROALBUMIN  02/11/2017  . HEMOGLOBIN A1C  03/28/2019  . INFLUENZA VACCINE  Completed  . DEXA SCAN  Completed  . PNA vac Low Risk Adult  Completed   Fall Risk  06/17/2018 04/22/2018 01/22/2017 07/23/2016 04/25/2015  Falls in the past year? - 0 No No No  Number falls in past yr: - 0 - - -  Injury with Fall? - 0 - - -  Risk for fall due to :  Impaired vision - - - -   Functional Status Survey:    Vitals:   04/21/19 0922  BP: 134/67  Pulse: 65  Resp: 16  Temp: (!) 97.3 F (36.3 C)  SpO2: 98%  Weight: 141 lb (64 kg)  Height: 5' (1.524 m)   Body mass index is 27.54 kg/m. Physical Exam  Constitutional: . Well-developed and well-nourished.  HENT:  Head: Normocephalic.  Mouth/Throat: Oropharynx is clear and moist.  Eyes: Pupils are equal, round, and reactive to light.  Neck: Neck supple.  Cardiovascular: Normal rate and normal heart sounds.  No murmur heard. Pulmonary/Chest: Effort normal and breath sounds normal. No respiratory distress. No wheezes. Has Few rales in her Bottom lung Exam Bilateral Abdominal: Soft. Bowel sounds are normal. No distension. There is no tenderness. There is no rebound.  Musculoskeletal: No edema.  Lymphadenopathy: none Neurological: NO Focal Deficits Skin: Skin is warm and dry.  Psychiatric: Normal mood and affect. Behavior is normal. Thought content normal.    Labs reviewed: Recent Labs    09/18/18 0120  09/18/18 0500 09/19/18 0427 09/20/18 0338 09/25/18 0340 09/25/18 0340 09/26/18 0341 09/26/18 0341 09/27/18 0401 09/27/18 0401 09/28/18 0401 10/08/18 0000 01/08/19 0000  NA  --    < > 138   < > 139   < > 140   < > 138   < > 138 142 141  K  --    < > 3.6   < > 3.8   < > 3.9   < > 3.7   < > 3.3* 4.1 4.4  CL  --    < > 105   < > 107   < > 105   < > 105   < > 102 103 104  CO2  --    < > 25   < > 24   < > 23   < > 25   < > 27 30 28   GLUCOSE  --    < > 128*   < > 140*   < > 128*  --  113*  --  157*  --   --   BUN  --    < > 5*   < > 6*   < > 9   < > 5*   < > 9 9 23*  CREATININE 0.51   < > 0.46   < > 0.36*   < > 0.47   < > 0.38*   < > 0.55 0.5 0.7  CALCIUM  --    < > 7.5*   < > 8.8*   < > 8.8*   < > 8.0*   < > 8.0* 8.5 9.3  MG 1.9   < > 2.1   < > 1.9  --   --   --  1.8  --  2.3  --   --   PHOS 1.7*  --  1.5*  --   --   --   --   --   --   --   --   --   --    < > = values in this interval not displayed.   Recent Labs    09/17/18 1934 09/19/18 0427 09/24/18 0626 09/27/18 0401 01/08/19 0000  AST 48*  --  25 20 17   ALT 29  --  20 16 14   ALKPHOS 168*  --  86 78 54  BILITOT 1.3*  --  0.2* 0.4  --   PROT  8.0  --  6.5 6.4* 6.8  ALBUMIN 2.7*   < > 2.3* 2.3* 3.9   < > = values in this interval not displayed.   Recent Labs    09/25/18 0340 09/25/18 0340 09/26/18 0341 09/26/18 0341 09/28/18 0401 09/28/18 0401 10/08/18 0000 11/05/18 0000 01/08/19 0000  WBC 17.3*   < > 15.2*   < > 16.1*  --  9.1 8.0 7.2  NEUTROABS 11.7*   < > 9.8*  --  12.0*  --   --   --  3,542  HGB 10.0*   < > 10.0*   < > 9.8*   < > 9.1* 10.2* 12.4  HCT 32.9*   < > 32.4*   < > 32.8*   < > 28* 32* 38  MCV 89.6  --  91.3  --  90.6  --   --   --   --   PLT 407*   < > 393   < > 410*   < > 414* 336 298   < > = values in this interval not displayed.   Lab Results  Component Value Date   TSH 3.01 01/08/2019   Lab Results  Component Value Date   HGBA1C 6.6 (H) 09/26/2018   Lab Results  Component Value Date    CHOL 275 (H) 04/06/2018   HDL 52 04/06/2018   LDLCALC 190 (H) 04/06/2018   TRIG 161 (H) 04/06/2018   CHOLHDL 5.3 (H) 04/06/2018    Significant Diagnostic Results in last 30 days:  No results found.  Assessment/Plan  Esophageal dysphagia Doing good Taken off Fosamax No recent Episodes of Choking On Prilosec  Essential hypertension Stable Not on Any Meds  Hyperlipidemia, unspecified hyperlipidemia type Allergic to statin  Late onset Alzheimer's disease without behavioral disturbance (HCC) Supportive care On Exelon and Namenda Depression Weight stable on Lexapro    Family/ staff Communication:   Labs/tests ordered:   Total time spent in this patient care encounter was  25_  minutes; greater than 50% of the visit spent counseling staff, reviewing records , Labs and coordinating care for problems addressed at this encounter.

## 2019-04-29 LAB — HM DIABETES FOOT EXAM

## 2019-05-13 ENCOUNTER — Encounter: Payer: Self-pay | Admitting: Nurse Practitioner

## 2019-05-13 ENCOUNTER — Non-Acute Institutional Stay (SKILLED_NURSING_FACILITY): Payer: Medicare PPO | Admitting: Nurse Practitioner

## 2019-05-13 DIAGNOSIS — J309 Allergic rhinitis, unspecified: Secondary | ICD-10-CM

## 2019-05-13 DIAGNOSIS — K0889 Other specified disorders of teeth and supporting structures: Secondary | ICD-10-CM | POA: Diagnosis not present

## 2019-05-13 DIAGNOSIS — F0150 Vascular dementia without behavioral disturbance: Secondary | ICD-10-CM | POA: Diagnosis not present

## 2019-05-13 NOTE — Assessment & Plan Note (Addendum)
Continue SNF [facility] for safety, care assistance. Continue Memantine, Rivastigmine.

## 2019-05-13 NOTE — Assessment & Plan Note (Signed)
c/o left lower tooth/gum pain x 3 days, no pain when she eats chex mix during my examination, no pain with finger swipe gum, under the tongue, roof of mouth. Will refer to dentist for evaluation.

## 2019-05-13 NOTE — Progress Notes (Signed)
Location:   [facility] Room Number: 3 Place of Service:  SNF (31) Provider:  Yohannes Waibel, NP  [name], MD  Patient Care Team: [name], MD as PCP - General (Internal Medicine) Lindwood Coke, MD as Consulting Physician (Dermatology) Christophe Louis, MD as Consulting Physician (Obstetrics and Gynecology) Latanya Maudlin, MD as Consulting Physician (Orthopedic Surgery) Garlan Fair, MD as Consulting Physician (Gastroenterology) Barnett Abu., MD as Consulting Physician (Cardiology) Neldon Mc Donnamarie Poag, MD as Consulting Physician (Allergy and Immunology) Rexene Alberts, MD as Consulting Physician (Cardiothoracic Surgery) Melida Quitter, MD as Consulting Physician (Otolaryngology) Ngetich, Nelda Bucks, NP as Nurse Practitioner (Family Medicine)  Extended Emergency Contact Information Primary Emergency Contact: Kathie Rhodes of [facility] Phone: [phone] Mobile Phone: [phone] Relation: Legal Guardian Secondary Emergency Contact: Leana G[place] of [place] Phone: [phone] Relation: Uncle  Code Status:  DNR Goals of care: Advanced Directive information Advanced Directives 05/13/2019  Does Patient Have a Medical Advance Directive? Yes  Type of Paramedic of [place];Out of facility DNR (pink MOST or yellow form)  Does patient want to make changes to medical advance directive? No - Patient declined  Copy of [place] in Chart? Yes - validated most recent copy scanned in chart (See row information)  Pre-existing out of facility DNR order (yellow form or pink MOST form) Pink MOST/Yellow Form most recent copy in chart - Physician notified to receive inpatient order     Chief Complaint  Patient presents with  . Acute Visit    Left Lower Tooth Pain    HPI:  Pt is a 86 y.o. female seen today for an acute visit for c/o left lower tooth/gum pain x 3 days, no  pain when she eats chex mix during my examination, no pain with finger swipe gum, under the tongue, roof of mouth. HPI was provided with assistance of staff. The patient resides in SNF [facility] for safety, care assistance, on Memantine, Rivastigmine for memory. Seasonal allergic rhinitis,  is stable on daily Zyrtec.    Past Medical History:  Diagnosis Date  . Abnormal CT scan, chest September 2011   Scar tissue  . Allergic rhinitis   . Alzheimer's dementia ([place])   . Asthma   . Diabetes mellitus    borderline and under control-diet ,exercise  . Esophageal stricture   . Family history of colon cancer    Mother  . GERD (gastroesophageal reflux disease)   . Hiatal hernia   . HLD (hyperlipidemia)   . Hypertension   . Osteopenia   . Pneumonia 12-15 yrs.ago   yrs. ago  . TIA (transient ischemic attack)   . Vitamin D deficiency    Past Surgical History:  Procedure Laterality Date  . ABDOMINAL HYSTERECTOMY  1980   . BALLOON DILATION N/A 09/27/2018   Procedure: BALLOON DILATION;  Surgeon: Jackquline Denmark, MD;  Location: [facility];  Service: Endoscopy;  Laterality: N/A;  . BIOPSY  09/27/2018   Procedure: BIOPSY;  Surgeon: Jackquline Denmark, MD;  Location: [facility];  Service: Endoscopy;;  . CATARACT EXTRACTION Left 2014  . ESOPHAGOGASTRODUODENOSCOPY (EGD) WITH PROPOFOL N/A 09/27/2018   Procedure: ESOPHAGOGASTRODUODENOSCOPY (EGD) WITH PROPOFOL;  Surgeon: Jackquline Denmark, MD;  Location: [facility];  Service: Endoscopy;  Laterality: N/A;  . SHOULDER OPEN ROTATOR CUFF REPAIR  05/22/2011   Procedure: ROTATOR CUFF REPAIR SHOULDER OPEN;  Surgeon: Tobi Bastos, MD;  Location: [facility];  Service: Orthopedics;  Laterality: Left;  . THORACOTOMY / DECORTICATION PARIETAL PLEURA Right 204   empyema  . TONSILLECTOMY      Allergies  Allergen Reactions  . Aricept [Donepezil Hcl] Diarrhea  . Crestor [Rosuvastatin Calcium]     Myalgia  . Lipitor [Atorvastatin Calcium]   . Lovastatin     Myalgia  .  Micardis [Telmisartan]     Fatigue  . Sertraline Diarrhea  . Welchol [Colesevelam Hcl]     Constipation  . Zetia [Ezetimibe]     Myalgia  . Zocor [Simvastatin]     Myalgia    Allergies as of 05/13/2019      Reactions   Aricept [donepezil Hcl] Diarrhea   Crestor [rosuvastatin Calcium]    Myalgia   Lipitor [atorvastatin Calcium]    Lovastatin    Myalgia   Micardis [telmisartan]    Fatigue   Sertraline Diarrhea   Welchol [colesevelam Hcl]    Constipation   Zetia [ezetimibe]    Myalgia   Zocor [simvastatin]    Myalgia      Medication List       Accurate as of May 13, 2019  1:59 PM. If you have any questions, ask your nurse or doctor.        acetaminophen 325 MG tablet Commonly known as: TYLENOL Take 325 mg by mouth every 6 (six) hours as needed for headache. Take Two Tablets ( 650 ) as needed.   aspirin EC 81 MG tablet Take 81 mg by mouth daily.   cetirizine 10 MG tablet Commonly known as: ZYRTEC Take 10 mg by mouth daily.   escitalopram 10 MG tablet Commonly known as: LEXAPRO Take 10 mg by mouth daily.   lactose free nutrition Liqd Take 237 mLs by mouth 2 (two) times daily between meals.   Liquid Calcium/Vitamin D 600-200 MG-UNIT Caps Generic drug: Calcium Carbonate-Vitamin D Take 1 tablet by mouth.   memantine 28 MG Cp24 24 hr capsule Commonly known as: NAMENDA XR TAKE ONE CAPSULE ONCE A DAY TO PRESERVE MEMORY.   omeprazole 20 MG capsule Commonly known as: PRILOSEC Take 20 mg by mouth daily.   rivastigmine 9.5 mg/24hr Commonly known as: EXELON Apply fresh patch daily and remove old patch to help preserve memory   zinc oxide 20 % ointment Apply 1 application topically as needed for irritation. Apply to buttocks/peri topical as needed      ROS was provided with assistance of staff.  Review of Systems  Constitutional: Negative for activity change, appetite change, chills, diaphoresis, fatigue and fever.  HENT: Positive for dental problem  and hearing loss. Negative for congestion, drooling, ear discharge, ear pain, facial swelling, mouth sores, nosebleeds, postnasal drip, rhinorrhea, sinus pressure, sinus pain, sneezing, sore throat, trouble swallowing and voice change.   Eyes: Negative for visual disturbance.  Respiratory: Negative for cough, shortness of breath and wheezing.   Cardiovascular: Negative for chest pain, palpitations and leg swelling.  Gastrointestinal: Negative for abdominal distention, abdominal pain, constipation, diarrhea, nausea and vomiting.  Genitourinary: Negative for difficulty urinating, dysuria and urgency.  Musculoskeletal: Positive for gait problem.  Skin: Negative for color change and pallor.  Neurological: Negative for dizziness, facial asymmetry, speech difficulty, weakness and headaches.       Dementia  Psychiatric/Behavioral: Positive for confusion. Negative for agitation, behavioral problems, hallucinations and sleep disturbance. The patient is not nervous/anxious.     Immunization History  Administered Date(s) Administered  . Influenza, High Dose Seasonal PF 01/14/2019  . Influenza,inj,Quad PF,6+ Mos 12/29/2017  .  Influenza-Unspecified 12/23/2013, 12/01/2014, 01/11/2016  . Moderna SARS-COVID-2 Vaccination 04/05/2019, 05/03/2019  . Pneumococcal Conjugate-13 11/22/2013  . Pneumococcal Polysaccharide-23 07/01/2006  . Tdap 12/17/2011  . Zoster 12/25/2005   Pertinent  Health Maintenance Due  Topic Date Due  . FOOT EXAM  12/06/1942  . OPHTHALMOLOGY EXAM  12/06/1942  . URINE MICROALBUMIN  02/11/2017  . HEMOGLOBIN A1C  03/28/2019  . INFLUENZA VACCINE  Completed  . DEXA SCAN  Completed  . PNA vac Low Risk Adult  Completed   Fall Risk  06/17/2018 04/22/2018 01/22/2017 07/23/2016 04/25/2015  Falls in the past year? - 0 No No No  Number falls in past yr: - 0 - - -  Injury with Fall? - 0 - - -  Risk for fall due to : Impaired vision - - - -   Functional Status Survey:    Vitals:   05/13/19  1115  BP: 122/71  Pulse: 67  Resp: 18  Temp: 97.6 F (36.4 C)  SpO2: 97%  Weight: 144 lb 8 oz (65.5 kg)  Height: 5' (1.524 m)   Body mass index is 28.22 kg/m. Physical Exam Vitals and nursing note reviewed.  Constitutional:      General: She is not in acute distress.    Appearance: Normal appearance. She is not ill-appearing, toxic-appearing or diaphoretic.  HENT:     Head: Normocephalic and atraumatic.     Jaw: No swelling or pain on movement.     Salivary Glands: Right salivary gland is not diffusely enlarged or tender. Left salivary gland is not diffusely enlarged or tender.     Nose: Nose normal. No congestion.     Mouth/Throat:     Mouth: Mucous membranes are moist.     Pharynx: Oropharynx is clear. No oropharyngeal exudate or posterior oropharyngeal erythema.     Comments: No pain when the patient ate chex mix during examination, no pain palpated gum, teeth, tongue, roof of mouth, lining of oral cavity.  Eyes:     Extraocular Movements: Extraocular movements intact.     Conjunctiva/sclera: Conjunctivae normal.     Pupils: Pupils are equal, round, and reactive to light.  Cardiovascular:     Rate and Rhythm: Normal rate and regular rhythm.     Heart sounds: No murmur.  Pulmonary:     Breath sounds: No wheezing, rhonchi or rales.  Abdominal:     General: Bowel sounds are normal. There is no distension.     Palpations: Abdomen is soft.     Tenderness: There is no abdominal tenderness. There is no right CVA tenderness, left CVA tenderness, guarding or rebound.  Musculoskeletal:     Cervical back: Normal range of motion and neck supple.     Right lower leg: No edema.     Left lower leg: No edema.  Skin:    General: Skin is warm and dry.  Neurological:     General: No focal deficit present.     Mental Status: She is alert. Mental status is at baseline.     Motor: No weakness.     Gait: Gait abnormal.     Comments: Oriented to self.   Psychiatric:     Comments:  Confused, but able to follow simple directions, able to voice pain     Labs reviewed: Recent Labs    09/18/18 0120 09/18/18 0500 09/19/18 0427 09/20/18 0338 09/25/18 0340 09/25/18 0340 09/26/18 0341 09/26/18 0341 09/27/18 0401 09/27/18 0401 09/28/18 0401 10/08/18 0000 01/08/19 0000  NA  --    < >  138   < > 139   < > 140   < > 138   < > 138 142 141  K  --    < > 3.6   < > 3.8   < > 3.9   < > 3.7   < > 3.3* 4.1 4.4  CL  --    < > 105   < > 107   < > 105   < > 105   < > 102 103 104  CO2  --    < > 25   < > 24   < > 23   < > 25   < > 27 30 28   GLUCOSE  --    < > 128*   < > 140*   < > 128*  --  113*  --  157*  --   --   BUN  --    < > 5*   < > 6*   < > 9   < > 5*   < > 9 9 23*  CREATININE 0.51   < > 0.46   < > 0.36*   < > 0.47   < > 0.38*   < > 0.55 0.5 0.7  CALCIUM  --    < > 7.5*   < > 8.8*   < > 8.8*   < > 8.0*   < > 8.0* 8.5 9.3  MG 1.9   < > 2.1   < > 1.9  --   --   --  1.8  --  2.3  --   --   PHOS 1.7*  --  1.5*  --   --   --   --   --   --   --   --   --   --    < > = values in this interval not displayed.   Recent Labs    09/17/18 1934 09/19/18 0427 09/24/18 0626 09/27/18 0401 01/08/19 0000  AST 48*  --  25 20 17   ALT 29  --  20 16 14   ALKPHOS 168*  --  86 78 54  BILITOT 1.3*  --  0.2* 0.4  --   PROT 8.0  --  6.5 6.4* 6.8  ALBUMIN 2.7*   < > 2.3* 2.3* 3.9   < > = values in this interval not displayed.   Recent Labs    09/25/18 0340 09/25/18 0340 09/26/18 0341 09/26/18 0341 09/28/18 0401 09/28/18 0401 10/08/18 0000 11/05/18 0000 01/08/19 0000  WBC 17.3*   < > 15.2*   < > 16.1*  --  9.1 8.0 7.2  NEUTROABS 11.7*   < > 9.8*  --  12.0*  --   --   --  3,542  HGB 10.0*   < > 10.0*   < > 9.8*   < > 9.1* 10.2* 12.4  HCT 32.9*   < > 32.4*   < > 32.8*   < > 28* 32* 38  MCV 89.6  --  91.3  --  90.6  --   --   --   --   PLT 407*   < > 393   < > 410*   < > 414* 336 298   < > = values in this interval not displayed.   Lab Results  Component Value Date   TSH  3.01 01/08/2019   Lab Results  Component Value Date   HGBA1C 6.6 (H)  09/26/2018   Lab Results  Component Value Date   CHOL 275 (H) 04/06/2018   HDL 52 04/06/2018   LDLCALC 190 (H) 04/06/2018   TRIG 161 (H) 04/06/2018   CHOLHDL 5.3 (H) 04/06/2018    Significant Diagnostic Results in last 30 days:  No results found.  Assessment/Plan Pain, dental c/o left lower tooth/gum pain x 3 days, no pain when she eats chex mix during my examination, no pain with finger swipe gum, under the tongue, roof of mouth. Will refer to dentist for evaluation.   Vascular dementia without behavioral disturbance ([place]) Continue SNF [facility] for safety, care assistance. Continue Memantine, Rivastigmine.   Allergic rhinitis Stable, continue daily Zyrtec.      Family/ staff Communication: plan of care reviewed with the patient and charge nurse.   Labs/tests ordered:  none  Time spend 25 minutes.

## 2019-05-13 NOTE — Assessment & Plan Note (Signed)
Stable, continue daily Zyrtec.

## 2019-05-25 ENCOUNTER — Non-Acute Institutional Stay (SKILLED_NURSING_FACILITY): Payer: Medicare PPO | Admitting: Nurse Practitioner

## 2019-05-25 ENCOUNTER — Encounter: Payer: Self-pay | Admitting: Nurse Practitioner

## 2019-05-25 DIAGNOSIS — F339 Major depressive disorder, recurrent, unspecified: Secondary | ICD-10-CM

## 2019-05-25 DIAGNOSIS — R7303 Prediabetes: Secondary | ICD-10-CM | POA: Diagnosis not present

## 2019-05-25 DIAGNOSIS — F0150 Vascular dementia without behavioral disturbance: Secondary | ICD-10-CM

## 2019-05-25 DIAGNOSIS — K219 Gastro-esophageal reflux disease without esophagitis: Secondary | ICD-10-CM

## 2019-05-25 NOTE — Assessment & Plan Note (Signed)
Will delay eye exam due to [place] pandemic, will foot exam in facility with Podiatrist.

## 2019-05-25 NOTE — Assessment & Plan Note (Signed)
Stable, continue Omeprazole 20mg qd.  

## 2019-05-25 NOTE — Progress Notes (Addendum)
Location:   [place] Room Number: 3 Place of Service:  SNF (31) Provider:  Dela Sweeny NP  [name], MD  Patient Care Team: [name], MD as PCP - General (Internal Medicine) Lindwood Coke, MD as Consulting Physician (Dermatology) Christophe Louis, MD as Consulting Physician (Obstetrics and Gynecology) Latanya Maudlin, MD as Consulting Physician (Orthopedic Surgery) Garlan Fair, MD as Consulting Physician (Gastroenterology) Barnett Abu., MD as Consulting Physician (Cardiology) Neldon Mc Donnamarie Poag, MD as Consulting Physician (Allergy and Immunology) Rexene Alberts, MD as Consulting Physician (Cardiothoracic Surgery) Melida Quitter, MD as Consulting Physician (Otolaryngology) Ngetich, Nelda Bucks, NP as Nurse Practitioner (Family Medicine)  Extended Emergency Contact Information Primary Emergency Contact: Kathie Rhodes of [place] Phone: [phone] Mobile Phone: [phone] Relation: Legal Guardian Secondary Emergency Contact: Leana Gamer States of [place] Phone: [phone] Relation: Uncle  Code Status:  DNR Goals of care: Advanced Directive information Advanced Directives 05/25/2019  Does Patient Have a Medical Advance Directive? Yes  Type of Paramedic of [place];Out of facility DNR (pink MOST or yellow form)  Does patient want to make changes to medical advance directive? No - Patient declined  Copy of [place] in Chart? Yes - validated most recent copy scanned in chart (See row information)  Pre-existing out of facility DNR order (yellow form or pink MOST form) Pink MOST form placed in chart (order not valid for inpatient use)     Chief Complaint  Patient presents with  . Medical Management of Chronic Issues  . Health Maintenance    Foot and eye exam, urine microalbumin, hemoglobin A1C    HPI:  Pt is a 86 y.o. female seen today for medical  management of chronic diseases.    The patient resides in SNF [facility] for safety, care assistance, ambulates independently, on Memantine 24 mg qd, Rivastigmine 9.5mg /24 hr for memory. Her mood is stable, on Escitalopram 10mg  qd. GERD, stable, on Omeprazole 20mg  qd. Prediabetic, Hgb a1c6.6 05/28/18   Past Medical History:  Diagnosis Date  . Abnormal CT scan, chest September 2011   Scar tissue  . Allergic rhinitis   . Alzheimer's dementia ([place])   . Asthma   . Diabetes mellitus    borderline and under control-diet ,exercise  . Esophageal stricture   . Family history of colon cancer    Mother  . GERD (gastroesophageal reflux disease)   . Hiatal hernia   . HLD (hyperlipidemia)   . Hypertension   . Osteopenia   . Pneumonia 12-15 yrs.ago   yrs. ago  . TIA (transient ischemic attack)   . Vitamin D deficiency    Past Surgical History:  Procedure Laterality Date  . ABDOMINAL HYSTERECTOMY  1980   . BALLOON DILATION N/A 09/27/2018   Procedure: BALLOON DILATION;  Surgeon: Jackquline Denmark, MD;  [facility];  Service: Endoscopy;  Laterality: N/A;  . BIOPSY  09/27/2018   Procedure: BIOPSY;  Surgeon: Jackquline Denmark, MD;  [facility];  Service: Endoscopy;;  . CATARACT EXTRACTION Left 2014  . ESOPHAGOGASTRODUODENOSCOPY (EGD) WITH PROPOFOL N/A 09/27/2018   Procedure: ESOPHAGOGASTRODUODENOSCOPY (EGD) WITH PROPOFOL;  Surgeon: Jackquline Denmark, MD;  [facility];  Service: Endoscopy;  Laterality: N/A;  . SHOULDER OPEN ROTATOR CUFF REPAIR  05/22/2011   Procedure: ROTATOR CUFF REPAIR SHOULDER OPEN;  Surgeon: Tobi Bastos, MD;  [facility];  Service: Orthopedics;  Laterality: Left;  . THORACOTOMY / DECORTICATION PARIETAL PLEURA Right 204  empyema  . TONSILLECTOMY      Allergies  Allergen Reactions  . Aricept [Donepezil Hcl] Diarrhea  . Crestor [Rosuvastatin Calcium]     Myalgia  . Lipitor [Atorvastatin Calcium]   . Lovastatin     Myalgia  . Micardis [Telmisartan]       Fatigue  . Sertraline Diarrhea  . Welchol [Colesevelam Hcl]     Constipation  . Zetia [Ezetimibe]     Myalgia  . Zocor [Simvastatin]     Myalgia    Allergies as of 05/25/2019      Reactions   Aricept [donepezil Hcl] Diarrhea   Crestor [rosuvastatin Calcium]    Myalgia   Lipitor [atorvastatin Calcium]    Lovastatin    Myalgia   Micardis [telmisartan]    Fatigue   Sertraline Diarrhea   Welchol [colesevelam Hcl]    Constipation   Zetia [ezetimibe]    Myalgia   Zocor [simvastatin]    Myalgia      Medication List       Accurate as of May 25, 2019  4:07 PM. If you have any questions, ask your nurse or doctor.        acetaminophen 325 MG tablet Commonly known as: TYLENOL Take 325 mg by mouth every 6 (six) hours as needed for headache. Take Two Tablets ( 650 ) as needed.   aspirin EC 81 MG tablet Take 81 mg by mouth daily.   cetirizine 10 MG tablet Commonly known as: ZYRTEC Take 10 mg by mouth daily.   escitalopram 10 MG tablet Commonly known as: LEXAPRO Take 10 mg by mouth daily.   lactose free nutrition Liqd Take 237 mLs by mouth 2 (two) times daily between meals.   Liquid Calcium/Vitamin D 600-200 MG-UNIT Caps Generic drug: Calcium Carbonate-Vitamin D Take 1 tablet by mouth.   memantine 28 MG Cp24 24 hr capsule Commonly known as: NAMENDA XR TAKE ONE CAPSULE ONCE A DAY TO PRESERVE MEMORY.   omeprazole 20 MG capsule Commonly known as: PRILOSEC Take 20 mg by mouth daily.   rivastigmine 9.5 mg/24hr Commonly known as: EXELON Apply fresh patch daily and remove old patch to help preserve memory   zinc oxide 20 % ointment Apply 1 application topically as needed for irritation. Apply to buttocks/peri topical as needed      ROS was provided with assistance of staff.  Review of Systems  Constitutional: Negative for activity change, appetite change, chills, diaphoresis, fatigue and fever.  HENT: Positive for hearing loss. Negative for congestion  and voice change.   Eyes: Negative for visual disturbance.  Respiratory: Negative for cough, shortness of breath and wheezing.   Cardiovascular: Negative for chest pain, palpitations and leg swelling.  Gastrointestinal: Negative for abdominal distention, abdominal pain, constipation, nausea and vomiting.  Genitourinary: Negative for difficulty urinating, dysuria and urgency.  Musculoskeletal: Positive for gait problem.  Skin: Negative for color change and pallor.  Neurological: Negative for dizziness, speech difficulty, weakness and headaches.       Dementia  Psychiatric/Behavioral: Positive for confusion. Negative for agitation, behavioral problems, hallucinations and sleep disturbance. The patient is not nervous/anxious.     Immunization History  Administered Date(s) Administered  . Influenza, High Dose Seasonal PF 01/14/2019  . Influenza,inj,Quad PF,6+ Mos 12/29/2017  . Influenza-Unspecified 12/23/2013, 12/01/2014, 01/11/2016  . Moderna SARS-COVID-2 Vaccination 04/05/2019, 05/03/2019  . Pneumococcal Conjugate-13 11/22/2013  . Pneumococcal Polysaccharide-23 07/01/2006  . Tdap 12/17/2011  . Zoster 12/25/2005   Pertinent  Health Maintenance Due  Topic Date Due  .  FOOT EXAM  12/06/1942  . OPHTHALMOLOGY EXAM  12/06/1942  . URINE MICROALBUMIN  02/11/2017  . HEMOGLOBIN A1C  03/28/2019  . INFLUENZA VACCINE  Completed  . DEXA SCAN  Completed  . PNA vac Low Risk Adult  Completed   Fall Risk  06/17/2018 04/22/2018 01/22/2017 07/23/2016 04/25/2015  Falls in the past year? - 0 No No No  Number falls in past yr: - 0 - - -  Injury with Fall? - 0 - - -  Risk for fall due to : Impaired vision - - - -   Functional Status Survey:    Vitals:   05/25/19 0922  BP: 118/63  Pulse: 65  Resp: 18  Temp: (!) 97.3 F (36.3 C)  SpO2: 98%  Weight: 144 lb 8 oz (65.5 kg)  Height: 5' (1.524 m)   Body mass index is 28.22 kg/m. Physical Exam Vitals and nursing note reviewed.  Constitutional:       General: She is not in acute distress.    Appearance: Normal appearance. She is not ill-appearing.  HENT:     Head: Normocephalic and atraumatic.     Jaw: No swelling or pain on movement.     Salivary Glands: Right salivary gland is not diffusely enlarged or tender. Left salivary gland is not diffusely enlarged or tender.     Nose: Nose normal. No congestion.     Mouth/Throat:     Mouth: Mucous membranes are moist.     Pharynx: No oropharyngeal exudate or posterior oropharyngeal erythema.  Eyes:     Extraocular Movements: Extraocular movements intact.     Conjunctiva/sclera: Conjunctivae normal.     Pupils: Pupils are equal, round, and reactive to light.  Cardiovascular:     Rate and Rhythm: Normal rate and regular rhythm.     Heart sounds: No murmur.  Pulmonary:     Breath sounds: Rales present. No wheezing or rhonchi.     Comments: Bibasilar rales.  Abdominal:     General: Bowel sounds are normal. There is no distension.     Palpations: Abdomen is soft.     Tenderness: There is no abdominal tenderness. There is no guarding or rebound.  Musculoskeletal:     Cervical back: Normal range of motion and neck supple.     Right lower leg: No edema.     Left lower leg: No edema.  Skin:    General: Skin is warm and dry.  Neurological:     General: No focal deficit present.     Mental Status: She is alert. Mental status is at baseline.     Motor: No weakness.     Gait: Gait abnormal.     Comments: Oriented to self.   Psychiatric:     Comments: Pleasantly confused, but able to follow simple directions     Labs reviewed: Recent Labs    09/17/18 0000 09/18/18 0120 09/18/18 0500 09/19/18 0427 09/20/18 0338 09/25/18 0340 09/25/18 0340 09/26/18 0341 09/26/18 0341 09/27/18 0401 09/27/18 0401 09/28/18 0401 10/08/18 0000 01/08/19 0000 05/04/19 0000  NA   < >  --    < > 138   < > 139   < > 140   < > 138   < > 138 142 141 137  K   < >  --    < > 3.6   < > 3.8   < > 3.9   < >  3.7   < > 3.3* 4.1 4.4 5.1  CL  --   --    < >  105   < > 107   < > 105   < > 105   < > 102 103 104 107  CO2  --   --    < > 25   < > 24   < > 23   < > 25   < > 27 30 28 18   GLUCOSE  --   --    < > 128*   < > 140*   < > 128*  --  113*  --  157*  --   --   --   BUN   < >  --    < > 5*   < > 6*   < > 9   < > 5*   < > 9 9 23* 70*  CREATININE   < > 0.51   < > 0.46   < > 0.36*   < > 0.47   < > 0.38*   < > 0.55 0.5 0.7 2.7*  CALCIUM  --   --    < > 7.5*   < > 8.8*   < > 8.8*   < > 8.0*   < > 8.0* 8.5 9.3 8.5*  MG  --  1.9   < > 2.1   < > 1.9  --   --   --  1.8  --  2.3  --   --   --   PHOS  --  1.7*  --  1.5*  --   --   --   --   --   --   --   --   --   --   --    < > = values in this interval not displayed.   Recent Labs    09/17/18 1934 09/19/18 0427 09/24/18 0626 09/27/18 0401 01/08/19 0000  AST 48*  --  25 20 17   ALT 29  --  20 16 14   ALKPHOS 168*  --  86 78 54  BILITOT 1.3*  --  0.2* 0.4  --   PROT 8.0  --  6.5 6.4* 6.8  ALBUMIN 2.7*   < > 2.3* 2.3* 3.9   < > = values in this interval not displayed.   Recent Labs    09/25/18 0340 09/25/18 0340 09/26/18 0341 09/26/18 0341 09/28/18 0401 10/08/18 0000 11/05/18 0000 01/08/19 0000 05/04/19 0000  WBC 17.3*   < > 15.2*   < > 16.1*   < > 8.0 7.2 11.7  NEUTROABS 11.7*   < > 9.8*   < > 12.0*  --   --  3,542 9,512  HGB 10.0*   < > 10.0*   < > 9.8*   < > 10.2* 12.4 6.8*  HCT 32.9*   < > 32.4*   < > 32.8*   < > 32* 38 21*  MCV 89.6  --  91.3  --  90.6  --   --   --   --   PLT 407*   < > 393   < > 410*   < > 336 298 253   < > = values in this interval not displayed.   Lab Results  Component Value Date   TSH 3.01 01/08/2019   Lab Results  Component Value Date   HGBA1C 6.6 (H) 09/26/2018   Lab Results  Component Value Date   CHOL 275 (H) 04/06/2018   HDL 52 04/06/2018   LDLCALC 190 (H) 04/06/2018  TRIG 161 (H) 04/06/2018   CHOLHDL 5.3 (H) 04/06/2018    Significant Diagnostic Results in last 30 days:  No results  found.  Assessment/Plan Prediabetes Will delay eye exam due to [place] pandemic, will foot exam in facility with Podiatrist.   Vascular dementia without behavioral disturbance ([place]) Continue SNF [facility] for safety, care assistance, continue Memantine, Rivastigmine.   Depression, recurrent ([place] [address] [place] [address]) Her mood is stable, continue Escitalopram 10mg  qd.   GERD (gastroesophageal reflux disease) Stable, continue Omeprazole 20mg  qd.      Family/ staff Communication: plan of care reviewed with the patient and charge nurse.   Labs/tests ordered:  none  Time spend 25 minutes.

## 2019-05-25 NOTE — Assessment & Plan Note (Signed)
Continue SNF [facility] for safety, care assistance, continue Memantine, Rivastigmine.

## 2019-05-25 NOTE — Assessment & Plan Note (Signed)
Her mood is stable, continue  Escitalopram 10mg qd.   

## 2019-06-02 LAB — HEPATIC FUNCTION PANEL
ALT: 15 (ref 7–35)
AST: 17 (ref 13–35)
Alkaline Phosphatase: 50 (ref 25–125)
Bilirubin, Total: 0.7

## 2019-06-02 LAB — BASIC METABOLIC PANEL
BUN: 20 (ref 4–21)
CO2: 28 — AB (ref 13–22)
Chloride: 103 (ref 99–108)
Creatinine: 0.6 (ref 0.5–1.1)
Glucose: 148
Potassium: 4.1 (ref 3.4–5.3)
Sodium: 139 (ref 137–147)

## 2019-06-02 LAB — COMPREHENSIVE METABOLIC PANEL
Albumin: 3.8 (ref 3.5–5.0)
Calcium: 8.9 (ref 8.7–10.7)
Globulin: 2.6

## 2019-06-02 LAB — CBC: RBC: 4.12 (ref 3.87–5.11)

## 2019-06-02 LAB — CBC AND DIFFERENTIAL
HCT: 37 (ref 36–46)
Hemoglobin: 12.7 (ref 12.0–16.0)
Neutrophils Absolute: 5495
Platelets: 247 (ref 150–399)
WBC: 9.9

## 2019-06-03 ENCOUNTER — Telehealth: Payer: Self-pay | Admitting: Family

## 2019-06-03 ENCOUNTER — Non-Acute Institutional Stay (SKILLED_NURSING_FACILITY): Payer: Medicare PPO | Admitting: Internal Medicine

## 2019-06-03 ENCOUNTER — Encounter: Payer: Self-pay | Admitting: Internal Medicine

## 2019-06-03 DIAGNOSIS — F0280 Dementia in other diseases classified elsewhere without behavioral disturbance: Secondary | ICD-10-CM | POA: Diagnosis not present

## 2019-06-03 DIAGNOSIS — Z8659 Personal history of other mental and behavioral disorders: Secondary | ICD-10-CM | POA: Diagnosis not present

## 2019-06-03 DIAGNOSIS — G301 Alzheimer's disease with late onset: Secondary | ICD-10-CM | POA: Diagnosis not present

## 2019-06-03 NOTE — Telephone Encounter (Signed)
Phone call received from facility Nurse reports CBC results normal except elevated absolute monocytes though improved compared to previous labs.Patient's condition and Vital signs stable.Advised to continue to monitor patient's vital signs Q shift x 24 Hrs.Provider to follow up in the morning.

## 2019-06-03 NOTE — Progress Notes (Signed)
Location:  [place] Room Number: 3-A Place of Service:  SNF [id number]) Provider:  [name], MD  Patient Care Team: [name], MD as PCP - General (Internal Medicine) Lindwood Coke, MD as Consulting Physician (Dermatology) Christophe Louis, MD as Consulting Physician (Obstetrics and Gynecology) Latanya Maudlin, MD as Consulting Physician (Orthopedic Surgery) Garlan Fair, MD as Consulting Physician (Gastroenterology) Barnett Abu., MD as Consulting Physician (Cardiology) Neldon Mc Donnamarie Poag, MD as Consulting Physician (Allergy and Immunology) Rexene Alberts, MD as Consulting Physician (Cardiothoracic Surgery) Melida Quitter, MD as Consulting Physician (Otolaryngology) Ngetich, Nelda Bucks, NP as Nurse Practitioner (Family Medicine)  Extended Emergency Contact Information Primary Emergency Contact: Kathie Rhodes of [place] Phone: [phone] Mobile Phone: [phone] Relation: Legal Guardian Secondary Emergency Contact: Leana Gamer States of [place] Phone: [phone] Relation: Uncle  Code Status:  DNR Goals of care: Advanced Directive information Advanced Directives 06/03/2019  Does Patient Have a Medical Advance Directive? Yes  Type of Advance Directive [place]  Does patient want to make changes to medical advance directive? No - Patient declined  Copy of [place] in Chart? Yes - validated most recent copy scanned in chart (See row information)  Pre-existing out of facility DNR order (yellow form or pink MOST form) Pink MOST form placed in chart (order not valid for inpatient use)     Chief Complaint  Patient presents with  . Acute Visit    Patient is seen for an episode of lethargy.  . Quality Metric Gaps    A1c, urine microalbumin    HPI:  Grace Gilbert is an 86 y.o. female seen today for an acute visit for Episode of Lethargy   Patient has h/o Hyperlipidemia,  Osteoporosis, Allergic Rhinitis and Dementia Alzheimer, Dysphagia s/p Dilatation 6/20 Per Nurses she had episode yesterday when she would not respond to them. Just stare at them and not follow any commands and was acting differently including staring at the Ceiling.   This episode lasted for 1-2 hours and then she became fine. We had ordered labs including CBC and CMP and they were all Normal This morning she is at baseline. Ate her breakfast and did not have any complains. Walking and responding appropriately No Fever , Cough or Chest pain Past Medical History:  Diagnosis Date  . Abnormal CT scan, chest September 2011   Scar tissue  . Allergic rhinitis   . Alzheimer's dementia ([place])   . Asthma   . Diabetes mellitus    borderline and under control-diet ,exercise  . Esophageal stricture   . Family history of colon cancer    Mother  . GERD (gastroesophageal reflux disease)   . Hiatal hernia   . HLD (hyperlipidemia)   . Hypertension   . Osteopenia   . Pneumonia 12-15 yrs.ago   yrs. ago  . TIA (transient ischemic attack)   . Vitamin D deficiency    Past Surgical History:  Procedure Laterality Date  . ABDOMINAL HYSTERECTOMY  1980   . BALLOON DILATION N/A 09/27/2018   Procedure: BALLOON DILATION;  Surgeon: Jackquline Denmark, MD;  [facility];  Service: Endoscopy;  Laterality: N/A;  . BIOPSY  09/27/2018   Procedure: BIOPSY;  Surgeon: Jackquline Denmark, MD;  [facility];  Service: Endoscopy;;  . CATARACT EXTRACTION Left 2014  . ESOPHAGOGASTRODUODENOSCOPY (EGD) WITH PROPOFOL N/A 09/27/2018   Procedure: ESOPHAGOGASTRODUODENOSCOPY (EGD) WITH PROPOFOL;  Surgeon: Jackquline Denmark, MD;  [facility];  Service: Endoscopy;  Laterality: N/A;  . SHOULDER OPEN ROTATOR CUFF REPAIR  05/22/2011   Procedure: ROTATOR CUFF REPAIR SHOULDER OPEN;  Surgeon: Tobi Bastos, MD;  [facility];  Service: Orthopedics;  Laterality: Left;  . THORACOTOMY / DECORTICATION PARIETAL PLEURA  Right 204   empyema  . TONSILLECTOMY      Allergies  Allergen Reactions  . Aricept [Donepezil Hcl] Diarrhea  . Crestor [Rosuvastatin Calcium]     Myalgia  . Lipitor [Atorvastatin Calcium]   . Lovastatin     Myalgia  . Micardis [Telmisartan]     Fatigue  . Sertraline Diarrhea  . Welchol [Colesevelam Hcl]     Constipation  . Zetia [Ezetimibe]     Myalgia  . Zocor [Simvastatin]     Myalgia    Outpatient Encounter Medications as of 06/03/2019  Medication Sig  . acetaminophen (TYLENOL) 325 MG tablet Take 325 mg by mouth every 6 (six) hours as needed for headache. Take Two Tablets ( 650 ) as needed.  Marland Kitchen aspirin EC 81 MG tablet Take 81 mg by mouth daily.  . Calcium Carbonate-Vitamin D (LIQUID CALCIUM/VITAMIN D) 600-200 MG-UNIT CAPS Take 1 tablet by mouth.  . cetirizine (ZYRTEC) 10 MG tablet Take 10 mg by mouth daily.  Marland Kitchen escitalopram (LEXAPRO) 10 MG tablet Take 10 mg by mouth daily.  Marland Kitchen lactose free nutrition (BOOST) LIQD Take 237 mLs by mouth 2 (two) times daily between meals.  . memantine (NAMENDA XR) 28 MG CP24 24 hr capsule TAKE ONE CAPSULE ONCE A DAY TO PRESERVE MEMORY.  Marland Kitchen omeprazole (PRILOSEC) 20 MG capsule Take 20 mg by mouth daily.  . rivastigmine (EXELON) 9.5 mg/24hr Apply fresh patch daily and remove old patch to help preserve memory  . zinc oxide 20 % ointment Apply 1 application topically as needed for irritation. Apply to buttocks/peri topical as needed   No facility-administered encounter medications on file as of 06/03/2019.    Review of Systems  Unable to perform ROS: Dementia    Immunization History  Administered Date(s) Administered  . Influenza, High Dose Seasonal PF 01/14/2019  . Influenza,inj,Quad PF,6+ Mos 12/29/2017  . Influenza-Unspecified 12/23/2013, 12/01/2014, 01/11/2016  . Moderna SARS-COVID-2 Vaccination 04/05/2019, 05/03/2019  . Pneumococcal Conjugate-13 11/22/2013  . Pneumococcal Polysaccharide-23 07/01/2006  . Tdap 12/17/2011  . Zoster 12/25/2005    Pertinent  Health Maintenance Due  Topic Date Due  . FOOT EXAM  12/06/1942  . OPHTHALMOLOGY EXAM  12/06/1942  . URINE MICROALBUMIN  02/11/2017  . HEMOGLOBIN A1C  03/28/2019  . INFLUENZA VACCINE  Completed  . DEXA SCAN  Completed  . PNA vac Low Risk Adult  Completed   Fall Risk  06/17/2018 04/22/2018 01/22/2017 07/23/2016 04/25/2015  Falls in the past year? - 0 No No No  Number falls in past yr: - 0 - - -  Injury with Fall? - 0 - - -  Risk for fall due to : Impaired vision - - - -   Functional Status Survey:    Vitals:   06/03/19 1517  BP: 139/76  Pulse: 64  Resp: 18  Temp: (!) 96.6 F (35.9 C)  TempSrc: Oral  SpO2: 90%  Weight: 144 lb 8 oz (65.5 kg)  Height: 5' (1.524 m)   Body mass index is 28.22 kg/m. Physical Exam  Constitutional:  Well-developed and well-nourished.  HENT:  Head: Normocephalic.  Mouth/Throat: Oropharynx is clear and moist.  Eyes: Pupils are equal, round, and reactive to light.  Neck: Neck supple.  Cardiovascular: Normal  rate and normal heart sounds.  No murmur heard. Pulmonary/Chest: Effort normal and breath sounds normal. No respiratory distress. No wheezes. She has no rales.  Abdominal: Soft. Bowel sounds are normal. No distension. There is no tenderness. There is no rebound.  Musculoskeletal: No edema.  Lymphadenopathy: none Neurological: No Focal Deficits Skin: Skin is warm and dry.  Psychiatric: Normal mood and affect. Behavior is normal. Thought content normal.    Labs reviewed: Recent Labs    09/17/18 0000 09/18/18 0120 09/18/18 0500 09/19/18 0427 09/20/18 0338 09/25/18 0340 09/25/18 0340 09/26/18 0341 09/26/18 0341 09/27/18 0401 09/27/18 0401 09/28/18 0401 10/08/18 0000 01/08/19 0000 05/04/19 0000  NA   < >  --    < > 138   < > 139   < > 140   < > 138   < > 138 142 141 137  K   < >  --    < > 3.6   < > 3.8   < > 3.9   < > 3.7   < > 3.3* 4.1 4.4 5.1  CL  --   --    < > 105   < > 107   < > 105   < > 105   < > 102 103 104  107  CO2  --   --    < > 25   < > 24   < > 23   < > 25   < > 27 30 28 18   GLUCOSE  --   --    < > 128*   < > 140*   < > 128*  --  113*  --  157*  --   --   --   BUN   < >  --    < > 5*   < > 6*   < > 9   < > 5*   < > 9 9 23* 70*  CREATININE   < > 0.51   < > 0.46   < > 0.36*   < > 0.47   < > 0.38*   < > 0.55 0.5 0.7 2.7*  CALCIUM  --   --    < > 7.5*   < > 8.8*   < > 8.8*   < > 8.0*   < > 8.0* 8.5 9.3 8.5*  MG  --  1.9   < > 2.1   < > 1.9  --   --   --  1.8  --  2.3  --   --   --   PHOS  --  1.7*  --  1.5*  --   --   --   --   --   --   --   --   --   --   --    < > = values in this interval not displayed.   Recent Labs    09/17/18 1934 09/19/18 0427 09/24/18 0626 09/27/18 0401 01/08/19 0000  AST 48*  --  25 20 17   ALT 29  --  20 16 14   ALKPHOS 168*  --  86 78 54  BILITOT 1.3*  --  0.2* 0.4  --   PROT 8.0  --  6.5 6.4* 6.8  ALBUMIN 2.7*   < > 2.3* 2.3* 3.9   < > = values in this interval not displayed.   Recent Labs    09/25/18 0340 09/25/18 0340 09/26/18 0341 09/26/18 0341 09/28/18  0401 10/08/18 0000 11/05/18 0000 01/08/19 0000 05/04/19 0000  WBC 17.3*   < > 15.2*   < > 16.1*   < > 8.0 7.2 11.7  NEUTROABS 11.7*   < > 9.8*   < > 12.0*  --   --  3,542 9,512  HGB 10.0*   < > 10.0*   < > 9.8*   < > 10.2* 12.4 6.8*  HCT 32.9*   < > 32.4*   < > 32.8*   < > 32* 38 21*  MCV 89.6  --  91.3  --  90.6  --   --   --   --   PLT 407*   < > 393   < > 410*   < > 336 298 253   < > = values in this interval not displayed.   Lab Results  Component Value Date   TSH 3.01 01/08/2019   Lab Results  Component Value Date   HGBA1C 6.6 (H) 09/26/2018   Lab Results  Component Value Date   CHOL 275 (H) 04/06/2018   HDL 52 04/06/2018   LDLCALC 190 (H) 04/06/2018   TRIG 161 (H) 04/06/2018   CHOLHDL 5.3 (H) 04/06/2018    Significant Diagnostic Results in last 30 days:  No results found.  Assessment/Plan Mental status change Now resolved ? TIA She is on Aspirin.  Will continue to  monitor IF continues to have these episodes consider Neuro COnsult   Late onset Alzheimer's disease  Seems at baseline this morning On Namenda and Exelon  Other Issues  Esophageal dysphagia Doing good Taken off Fosamax No recent Episodes of Choking On Prilosec  Essential hypertension Stable Not on Any Meds  Hyperlipidemia, unspecified hyperlipidemia type Allergic to statin Depression On lexapro  Family/ staff Communication:   Labs/tests ordered:    Total time spent in this patient care encounter was  25_  minutes; greater than 50% of the visit spent counseling patient and staff, reviewing records , Labs and coordinating care for problems addressed at this encounter.

## 2019-06-04 ENCOUNTER — Other Ambulatory Visit: Payer: Self-pay

## 2019-06-04 MED ORDER — ESCITALOPRAM OXALATE 10 MG PO TABS: 10.0000 mg | ORAL_TABLET | Freq: Every day | ORAL | 6 refills | Status: DC

## 2019-06-04 NOTE — Telephone Encounter (Signed)
Patient had a fax sent from [facility] for a refill on Escitalopram 10mg .

## 2019-06-10 NOTE — Progress Notes (Signed)
Order(s) created erroneously. Erroneous order ID: [id number]  Order moved by: Lianna Sitzmann Y  Order move date/time: 06/10/2019 2:15 PM  Source Patient: [id number]  Source Contact: 05/25/2019  Destination Patient: [id number]  Destination Contact: 05/04/2019  Erroneous order ID: [id number]  Order moved by: Viraj Liby Y  Order move date/time: 06/10/2019 2:15 PM  Source Patient: [id number]  Source Contact: 05/25/2019  Destination Patient: [id number]  Destination Contact: 05/04/2019  Erroneous order ID: [id number]  Order moved by: Narek Kniss Y  Order move date/time: 06/10/2019 2:15 PM  Source Patient: [id number]  Source Contact: 05/25/2019  Destination Patient: [id number]  Destination Contact: 05/04/2019  Erroneous order ID: [id number]  Order moved by: Traci Plemons Y  Order move date/time: 06/10/2019 2:15 PM  Source Patient: [id number]  Source Contact: 05/25/2019  Destination Patient: [id number]  Destination Contact: 05/04/2019 

## 2019-06-22 ENCOUNTER — Non-Acute Institutional Stay (SKILLED_NURSING_FACILITY): Payer: Medicare PPO | Admitting: Nurse Practitioner

## 2019-06-22 ENCOUNTER — Encounter: Payer: Self-pay | Admitting: Nurse Practitioner

## 2019-06-22 DIAGNOSIS — K219 Gastro-esophageal reflux disease without esophagitis: Secondary | ICD-10-CM

## 2019-06-22 DIAGNOSIS — R635 Abnormal weight gain: Secondary | ICD-10-CM | POA: Diagnosis not present

## 2019-06-22 DIAGNOSIS — F0150 Vascular dementia without behavioral disturbance: Secondary | ICD-10-CM

## 2019-06-22 DIAGNOSIS — R7303 Prediabetes: Secondary | ICD-10-CM | POA: Diagnosis not present

## 2019-06-22 DIAGNOSIS — F339 Major depressive disorder, recurrent, unspecified: Secondary | ICD-10-CM

## 2019-06-22 NOTE — Assessment & Plan Note (Signed)
Stable, continue Omeprazole.  

## 2019-06-22 NOTE — Assessment & Plan Note (Signed)
05/25/19 Will delay eye exam due to [place] pandemic, will foot exam in facility with Podiatrist.  06/22/19 update Hgb a1c, urine micro albumin in setting of weigh gain.

## 2019-06-22 NOTE — Assessment & Plan Note (Signed)
About #10Ibs wight gained in the past month, ? Wt measurement, will continue to monitor wt, obtain TSH (last TSH 3.01 01/08/19).

## 2019-06-22 NOTE — Assessment & Plan Note (Signed)
Her mood is stable, continue Escitalopram

## 2019-06-22 NOTE — Progress Notes (Signed)
Location:   [place] Room Number: 3 Place of Service:  SNF (31) Provider:  Palmyra Rogacki NP  [name], MD  Patient Care Team: [name], MD as PCP - General (Internal Medicine) Lindwood Coke, MD as Consulting Physician (Dermatology) Christophe Louis, MD as Consulting Physician (Obstetrics and Gynecology) Latanya Maudlin, MD as Consulting Physician (Orthopedic Surgery) Garlan Fair, MD as Consulting Physician (Gastroenterology) Barnett Abu., MD as Consulting Physician (Cardiology) Neldon Mc Donnamarie Poag, MD as Consulting Physician (Allergy and Immunology) Rexene Alberts, MD as Consulting Physician (Cardiothoracic Surgery) Melida Quitter, MD as Consulting Physician (Otolaryngology) Ngetich, Nelda Bucks, NP as Nurse Practitioner (Family Medicine)  Extended Emergency Contact Information Primary Emergency Contact: Kathie Rhodes of [place] Phone: [phone] Mobile Phone: [phone] Relation: Legal Guardian Secondary Emergency Contact: Leana Gamer [place] of [place] Phone: [phone] Relation: Uncle  Code Status:  DNR Goals of care: Advanced Directive information Advanced Directives 06/22/2019  Does Patient Have a Medical Advance Directive? Yes  Type of Advance Directive [place]  Does patient want to make changes to medical advance directive? No - Patient declined  Copy of [place] in Chart? Yes - validated most recent copy scanned in chart (See row information)  Pre-existing out of facility DNR order (yellow form or pink MOST form) -     Chief Complaint  Patient presents with  . Medical Management of Chronic Issues  . Health Maintenance    Foot and eye exam, urine microalbumin, hemoglobin A1C    HPI:  Pt is a 86 y.o. female seen today for medical management of chronic diseases.    The patient resides in SNF [facility] for safety, care assistance, ambulates with  walker or furniture, on Memantine, Rivastigmine for memory, her mood is stable, on Escitalopram 10mg  qd. Hx of GERD, stable, on Omeprazole 20mg  qd. Weight gained about #10Ibs in the past month per record, TSH 3.01 01/08/19   Past Medical History:  Diagnosis Date  . Abnormal CT scan, chest September 2011   Scar tissue  . Allergic rhinitis   . Alzheimer's dementia ([place])   . Asthma   . Diabetes mellitus    borderline and under control-diet ,exercise  . Esophageal stricture   . Family history of colon cancer    Mother  . GERD (gastroesophageal reflux disease)   . Hiatal hernia   . HLD (hyperlipidemia)   . Hypertension   . Osteopenia   . Pneumonia 12-15 yrs.ago   yrs. ago  . TIA (transient ischemic attack)   . Vitamin D deficiency    Past Surgical History:  Procedure Laterality Date  . ABDOMINAL HYSTERECTOMY  1980   . BALLOON DILATION N/A 09/27/2018   Procedure: BALLOON DILATION;  Surgeon: Jackquline Denmark, MD;  [facility]: [facility];  Service: Endoscopy;  Laterality: N/A;  . BIOPSY  09/27/2018   Procedure: BIOPSY;  Surgeon: Jackquline Denmark, MD;  [facility];  Service: Endoscopy;;  . CATARACT EXTRACTION Left 2014  . ESOPHAGOGASTRODUODENOSCOPY (EGD) WITH PROPOFOL N/A 09/27/2018   Procedure: ESOPHAGOGASTRODUODENOSCOPY (EGD) WITH PROPOFOL;  Surgeon: Jackquline Denmark, MD;  [facility];  Service: Endoscopy;  Laterality: N/A;  . SHOULDER OPEN ROTATOR CUFF REPAIR  05/22/2011   Procedure: ROTATOR CUFF REPAIR SHOULDER OPEN;  Surgeon: Tobi Bastos, MD;  [facility];  Service: Orthopedics;  Laterality: Left;  . THORACOTOMY / DECORTICATION PARIETAL PLEURA Right 204   empyema  . TONSILLECTOMY  Allergies  Allergen Reactions  . Aricept [Donepezil Hcl] Diarrhea  . Crestor [Rosuvastatin Calcium]     Myalgia  . Lipitor [Atorvastatin Calcium]   . Lovastatin     Myalgia  . Micardis [Telmisartan]     Fatigue  . Sertraline Diarrhea  . Welchol [Colesevelam Hcl]      Constipation  . Zetia [Ezetimibe]     Myalgia  . Zocor [Simvastatin]     Myalgia    Allergies as of 06/22/2019      Reactions   Aricept [donepezil Hcl] Diarrhea   Crestor [rosuvastatin Calcium]    Myalgia   Lipitor [atorvastatin Calcium]    Lovastatin    Myalgia   Micardis [telmisartan]    Fatigue   Sertraline Diarrhea   Welchol [colesevelam Hcl]    Constipation   Zetia [ezetimibe]    Myalgia   Zocor [simvastatin]    Myalgia      Medication List       Accurate as of June 22, 2019 12:04 PM. If you have any questions, ask your nurse or doctor.        acetaminophen 325 MG tablet Commonly known as: TYLENOL Take 325 mg by mouth every 6 (six) hours as needed for headache. Take Two Tablets ( 650 ) as needed.   aspirin EC 81 MG tablet Take 81 mg by mouth daily.   cetirizine 10 MG tablet Commonly known as: ZYRTEC Take 10 mg by mouth daily.   escitalopram 10 MG tablet Commonly known as: LEXAPRO Take 1 tablet (10 mg total) by mouth daily.   lactose free nutrition Liqd Take 237 mLs by mouth 2 (two) times daily between meals.   Liquid Calcium/Vitamin D 600-200 MG-UNIT Caps Generic drug: Calcium Carbonate-Vitamin D Take 1 tablet by mouth.   memantine 28 MG Cp24 24 hr capsule Commonly known as: NAMENDA XR TAKE ONE CAPSULE ONCE A DAY TO PRESERVE MEMORY.   omeprazole 20 MG capsule Commonly known as: PRILOSEC Take 20 mg by mouth daily.   rivastigmine 9.5 mg/24hr Commonly known as: EXELON Apply fresh patch daily and remove old patch to help preserve memory   zinc oxide 20 % ointment Apply 1 application topically as needed for irritation. Apply to buttocks/peri topical as needed      ROS was provided with assistance of staff.  Review of Systems  Constitutional: Positive for unexpected weight change. Negative for activity change, appetite change, fatigue and fever.       Weight gained about #10Ibs in the past month.   HENT: Positive for hearing loss. Negative  for congestion and voice change.   Eyes: Negative for visual disturbance.  Respiratory: Negative for cough and shortness of breath.   Cardiovascular: Negative for chest pain and leg swelling.  Gastrointestinal: Negative for abdominal distention, abdominal pain and constipation.  Genitourinary: Negative for difficulty urinating, dysuria and urgency.  Musculoskeletal: Positive for gait problem.  Skin: Negative for color change.  Neurological: Negative for speech difficulty, weakness and light-headedness.       Dementia  Psychiatric/Behavioral: Positive for confusion. Negative for agitation, behavioral problems and sleep disturbance. The patient is not nervous/anxious.     Immunization History  Administered Date(s) Administered  . Influenza, High Dose Seasonal PF 01/14/2019  . Influenza,inj,Quad PF,6+ Mos 12/29/2017  . Influenza-Unspecified 12/23/2013, 12/01/2014, 01/11/2016  . Moderna SARS-COVID-2 Vaccination 04/05/2019, 05/03/2019  . Pneumococcal Conjugate-13 11/22/2013  . Pneumococcal Polysaccharide-23 07/01/2006  . Tdap 12/17/2011  . Zoster 12/25/2005   Pertinent  Health Maintenance Due  Topic  Date Due  . FOOT EXAM  Never done  . OPHTHALMOLOGY EXAM  Never done  . URINE MICROALBUMIN  02/11/2017  . HEMOGLOBIN A1C  03/28/2019  . INFLUENZA VACCINE  Completed  . DEXA SCAN  Completed  . PNA vac Low Risk Adult  Completed   Fall Risk  06/17/2018 04/22/2018 01/22/2017 07/23/2016 04/25/2015  Falls in the past year? - 0 No No No  Number falls in past yr: - 0 - - -  Injury with Fall? - 0 - - -  Risk for fall due to : Impaired vision - - - -   Functional Status Survey:    Vitals:   06/22/19 1001  BP: 114/65  Pulse: 66  Resp: 20  Temp: 98.2 F (36.8 C)  SpO2: 96%  Weight: 150 lb 1.6 oz (68.1 kg)  Height: 5' (1.524 m)   Body mass index is 29.31 kg/m. Physical Exam Vitals and nursing note reviewed.  Constitutional:      General: She is not in acute distress.    Appearance:  Normal appearance. She is not ill-appearing, toxic-appearing or diaphoretic.  HENT:     Head: Normocephalic and atraumatic.     Jaw: No swelling or pain on movement.     Salivary Glands: Right salivary gland is not diffusely enlarged or tender. Left salivary gland is not diffusely enlarged or tender.     Nose: Nose normal.     Mouth/Throat:     Mouth: Mucous membranes are moist.     Pharynx: No oropharyngeal exudate or posterior oropharyngeal erythema.  Eyes:     Extraocular Movements: Extraocular movements intact.     Conjunctiva/sclera: Conjunctivae normal.     Pupils: Pupils are equal, round, and reactive to light.  Cardiovascular:     Rate and Rhythm: Normal rate and regular rhythm.     Heart sounds: No murmur.  Pulmonary:     Breath sounds: No wheezing, rhonchi or rales.  Abdominal:     General: Bowel sounds are normal. There is no distension.     Palpations: Abdomen is soft.     Tenderness: There is no abdominal tenderness. There is no right CVA tenderness, left CVA tenderness, guarding or rebound.  Musculoskeletal:     Cervical back: Normal range of motion and neck supple.     Right lower leg: No edema.     Left lower leg: No edema.  Skin:    General: Skin is warm and dry.  Neurological:     General: No focal deficit present.     Mental Status: She is alert. Mental status is at baseline.     Motor: No weakness.     Coordination: Coordination normal.     Gait: Gait abnormal.     Comments: Oriented to self.   Psychiatric:     Comments: Pleasantly confused, but able to follow simple directions     Labs reviewed: Recent Labs    09/17/18 0000 09/18/18 0120 09/18/18 0500 09/19/18 0427 09/20/18 0338 09/25/18 0340 09/25/18 0340 09/26/18 0341 09/26/18 0341 09/27/18 0401 09/27/18 0401 09/28/18 0401 10/08/18 0000 01/08/19 0000 06/02/19 0000  NA   < >  --    < > 138   < > 139   < > 140   < > 138   < > 138 142 141 139  K   < >  --    < > 3.6   < > 3.8   < > 3.9    < >  3.7   < > 3.3* 4.1 4.4 4.1  CL  --   --    < > 105   < > 107   < > 105   < > 105   < > 102 103 104 103  CO2  --   --    < > 25   < > 24   < > 23   < > 25   < > 27 30 28  28*  GLUCOSE  --   --    < > 128*   < > 140*   < > 128*  --  113*  --  157*  --   --   --   BUN   < >  --    < > 5*   < > 6*   < > 9   < > 5*   < > 9 9 23* 20  CREATININE   < > 0.51   < > 0.46   < > 0.36*   < > 0.47   < > 0.38*   < > 0.55 0.5 0.7 0.6  CALCIUM  --   --    < > 7.5*   < > 8.8*   < > 8.8*   < > 8.0*   < > 8.0* 8.5 9.3 8.9  MG  --  1.9   < > 2.1   < > 1.9  --   --   --  1.8  --  2.3  --   --   --   PHOS  --  1.7*  --  1.5*  --   --   --   --   --   --   --   --   --   --   --    < > = values in this interval not displayed.   Recent Labs    09/17/18 1934 09/19/18 0427 09/24/18 0626 09/24/18 0626 09/27/18 0401 01/08/19 0000 06/02/19 0000  AST 48*  --  25   < > 20 17 17   ALT 29  --  20   < > 16 14 15   ALKPHOS 168*  --  86   < > 78 54 50  BILITOT 1.3*  --  0.2*  --  0.4  --   --   PROT 8.0  --  6.5  --  6.4* 6.8  --   ALBUMIN 2.7*   < > 2.3*   < > 2.3* 3.9 3.8   < > = values in this interval not displayed.   Recent Labs    09/25/18 0340 09/25/18 0340 09/26/18 0341 09/26/18 0341 09/28/18 0401 10/08/18 0000 11/05/18 0000 01/08/19 0000 06/02/19 0000  WBC 17.3*   < > 15.2*   < > 16.1*   < > 8.0 7.2 9.9  NEUTROABS 11.7*   < > 9.8*   < > 12.0*  --   --  3,542 5,495  HGB 10.0*   < > 10.0*   < > 9.8*   < > 10.2* 12.4 12.7  HCT 32.9*   < > 32.4*   < > 32.8*   < > 32* 38 37  MCV 89.6  --  91.3  --  90.6  --   --   --   --   PLT 407*   < > 393   < > 410*   < > 336 298 247   < > = values in this  interval not displayed.   Lab Results  Component Value Date   TSH 3.01 01/08/2019   Lab Results  Component Value Date   HGBA1C 6.6 (H) 09/26/2018   Lab Results  Component Value Date   CHOL 275 (H) 04/06/2018   HDL 52 04/06/2018   LDLCALC 190 (H) 04/06/2018   TRIG 161 (H) 04/06/2018   CHOLHDL 5.3 (H)  04/06/2018    Significant Diagnostic Results in last 30 days:  No results found.  Assessment/Plan Weight gain About #10Ibs wight gained in the past month, ? Wt measurement, will continue to monitor wt, obtain TSH (last TSH 3.01 01/08/19).   Prediabetes 05/25/19 Will delay eye exam due to [place] pandemic, will foot exam in facility with Podiatrist.  06/22/19 update Hgb a1c, urine micro albumin in setting of weigh gain.    Depression, recurrent ([place]) Her mood is stable, continue Escitalopram  Vascular dementia without behavioral disturbance ([place]) Continue SNF [facility] for safety, care assistance, continue Memantine, Rivastigmine.   GERD (gastroesophageal reflux disease) Stable, continue Omeprazole.      Family/ staff Communication: plan of care reviewed with the patient and charge nurse.   Labs/tests ordered: TSH, Hgb a1c, urine microalbumin   Time spend 25 minutes.

## 2019-06-22 NOTE — Assessment & Plan Note (Signed)
Continue SNF [facility] for safety, care assistance, continue Memantine, Rivastigmine.

## 2019-06-24 LAB — HEMOGLOBIN A1C: Hemoglobin A1C: 5.9

## 2019-06-24 LAB — TSH: TSH: 3.33 (ref 0.41–5.90)

## 2019-07-29 ENCOUNTER — Non-Acute Institutional Stay (SKILLED_NURSING_FACILITY): Payer: Medicare PPO | Admitting: Internal Medicine

## 2019-07-29 ENCOUNTER — Encounter: Payer: Self-pay | Admitting: Internal Medicine

## 2019-07-29 DIAGNOSIS — K219 Gastro-esophageal reflux disease without esophagitis: Secondary | ICD-10-CM | POA: Diagnosis not present

## 2019-07-29 DIAGNOSIS — E785 Hyperlipidemia, unspecified: Secondary | ICD-10-CM | POA: Diagnosis not present

## 2019-07-29 DIAGNOSIS — R7303 Prediabetes: Secondary | ICD-10-CM

## 2019-07-29 DIAGNOSIS — I10 Essential (primary) hypertension: Secondary | ICD-10-CM

## 2019-07-29 NOTE — Progress Notes (Signed)
Location:  [place] Room Number: 3A Place of Service:  SNF [phone]) Provider:  [name], MD  Patient Care Team: [name], MD as PCP - General (Internal Medicine) Lindwood Coke, MD as Consulting Physician (Dermatology) Christophe Louis, MD as Consulting Physician (Obstetrics and Gynecology) Latanya Maudlin, MD as Consulting Physician (Orthopedic Surgery) Garlan Fair, MD as Consulting Physician (Gastroenterology) Barnett Abu., MD as Consulting Physician (Cardiology) Neldon Mc Donnamarie Poag, MD as Consulting Physician (Allergy and Immunology) Rexene Alberts, MD as Consulting Physician (Cardiothoracic Surgery) Melida Quitter, MD as Consulting Physician (Otolaryngology) Ngetich, Nelda Bucks, NP as Nurse Practitioner (Family Medicine)  Extended Emergency Contact Information Primary Emergency Contact: Kathie Rhodes of [place] Phone: [phone] Mobile Phone: [phone] Relation: Legal Guardian Secondary Emergency Contact: Leana Gamer [place] of [place] Phone: [phone] Relation: Uncle  Code Status:  DNR  Goals of care: Advanced Directive information Advanced Directives 06/22/2019  Does Patient Have a Medical Advance Directive? Yes  Type of Advance Directive [place]  Does patient want to make changes to medical advance directive? No - Patient declined  Copy of [place] in Chart? Yes - validated most recent copy scanned in chart (See row information)  Pre-existing out of facility DNR order (yellow form or pink MOST form) -     Chief Complaint  Patient presents with  . Medical Management of Chronic Issues    Routine [facility] visit  . Quality Metric Gaps    Needs A1c, urine microalbumin, diabetic foot and eye exams    HPI:  Pt is an 86 y.o. female seen today for medical management of chronic diseases.    Patient has h/o Hyperlipidemia, Osteoporosis,  Allergic Rhinitis and Dementia Alzheimer, Dysphagia s/p Dilatation 6/20 Has h/o Choking Episodes  Long term patient with Dementia No Nursing issues. No Falls . Has gained weight. Walks with no assists No Dysphagia since Fosamax stopped  Past Medical History:  Diagnosis Date  . Abnormal CT scan, chest September 2011   Scar tissue  . Allergic rhinitis   . Alzheimer's dementia ([place])   . Asthma   . Diabetes mellitus    borderline and under control-diet ,exercise  . Esophageal stricture   . Family history of colon cancer    Mother  . GERD (gastroesophageal reflux disease)   . Hiatal hernia   . HLD (hyperlipidemia)   . Hypertension   . Osteopenia   . Pneumonia 12-15 yrs.ago   yrs. ago  . TIA (transient ischemic attack)   . Vitamin D deficiency    Past Surgical History:  Procedure Laterality Date  . ABDOMINAL HYSTERECTOMY  1980   . BALLOON DILATION N/A 09/27/2018   Procedure: BALLOON DILATION;  Surgeon: Jackquline Denmark, MD;  [facility];  Service: Endoscopy;  Laterality: N/A;  . BIOPSY  09/27/2018   Procedure: BIOPSY;  Surgeon: Jackquline Denmark, MD;  [facility];  Service: Endoscopy;;  . CATARACT EXTRACTION Left 2014  . ESOPHAGOGASTRODUODENOSCOPY (EGD) WITH PROPOFOL N/A 09/27/2018   Procedure: ESOPHAGOGASTRODUODENOSCOPY (EGD) WITH PROPOFOL;  Surgeon: Jackquline Denmark, MD;  [facility];  Service: Endoscopy;  Laterality: N/A;  . SHOULDER OPEN ROTATOR CUFF REPAIR  05/22/2011   Procedure: ROTATOR CUFF REPAIR SHOULDER OPEN;  Surgeon: Tobi Bastos, MD;  [facility];  Service: Orthopedics;  Laterality: Left;  . THORACOTOMY / DECORTICATION PARIETAL PLEURA Right 204   empyema  . TONSILLECTOMY      Allergies  Allergen Reactions  . Aricept [Donepezil Hcl] Diarrhea  . Crestor [Rosuvastatin Calcium]     Myalgia  . Lipitor [Atorvastatin Calcium]   . Lovastatin     Myalgia  . Micardis [Telmisartan]     Fatigue  . Sertraline Diarrhea  . Welchol  [Colesevelam Hcl]     Constipation  . Zetia [Ezetimibe]     Myalgia  . Zocor [Simvastatin]     Myalgia    Outpatient Encounter Medications as of 07/29/2019  Medication Sig  . acetaminophen (TYLENOL) 325 MG tablet Take 325 mg by mouth every 6 (six) hours as needed for headache. Take Two Tablets ( 650 ) as needed.  Marland Kitchen aspirin EC 81 MG tablet Take 81 mg by mouth daily.  . Calcium Carbonate-Vitamin D (LIQUID CALCIUM/VITAMIN D) 600-200 MG-UNIT CAPS Take 1 tablet by mouth.  . cetirizine (ZYRTEC) 10 MG tablet Take 10 mg by mouth daily.  . chlorhexidine (PERIDEX) 0.12 % solution Use as directed 5 mLs in the mouth or throat in the morning.  . escitalopram (LEXAPRO) 10 MG tablet Take 1 tablet (10 mg total) by mouth daily.  . memantine (NAMENDA XR) 28 MG CP24 24 hr capsule TAKE ONE CAPSULE ONCE A DAY TO PRESERVE MEMORY.  Marland Kitchen omeprazole (PRILOSEC) 20 MG capsule Take 20 mg by mouth daily.  . rivastigmine (EXELON) 9.5 mg/24hr Apply fresh patch daily and remove old patch to help preserve memory  . Sodium Fluoride (PREVIDENT 5000 BOOSTER PLUS) 1.1 % PSTE Place 1 application onto teeth every evening.  . zinc oxide 20 % ointment Apply 1 application topically as needed for irritation. Apply to buttocks/peri topical as needed  . [DISCONTINUED] lactose free nutrition (BOOST) LIQD Take 237 mLs by mouth 2 (two) times daily between meals.   No facility-administered encounter medications on file as of 07/29/2019.    Review of Systems  Unable to perform ROS: Dementia    Immunization History  Administered Date(s) Administered  . Influenza, High Dose Seasonal PF 01/14/2019  . Influenza,inj,Quad PF,6+ Mos 12/29/2017  . Influenza-Unspecified 12/23/2013, 12/01/2014, 01/11/2016  . Moderna SARS-COVID-2 Vaccination 04/05/2019, 05/03/2019  . Pneumococcal Conjugate-13 11/22/2013  . Pneumococcal Polysaccharide-23 07/01/2006  . Tdap 12/17/2011  . Zoster 12/25/2005   Pertinent  Health Maintenance Due  Topic Date Due    . FOOT EXAM  Never done  . OPHTHALMOLOGY EXAM  Never done  . URINE MICROALBUMIN  02/11/2017  . HEMOGLOBIN A1C  03/28/2019  . INFLUENZA VACCINE  10/31/2019  . DEXA SCAN  Completed  . PNA vac Low Risk Adult  Completed   Fall Risk  06/17/2018 04/22/2018 01/22/2017 07/23/2016 04/25/2015  Falls in the past year? - 0 No No No  Number falls in past yr: - 0 - - -  Injury with Fall? - 0 - - -  Risk for fall due to : Impaired vision - - - -   Functional Status Survey:    Vitals:   07/29/19 1212  BP: (!) 143/84  Pulse: 79  Resp: 16  Temp: 98.2 F (36.8 C)  TempSrc: Oral  SpO2: 95%  Weight: 155 lb (70.3 kg)  Height: 5' (1.524 m)   Body mass index is 30.27 kg/m. Physical Exam  Constitutional: . Well-developed and well-nourished.  HENT:  Head: Normocephalic.  Mouth/Throat: Oropharynx is clear and moist.  Eyes: Pupils are equal, round, and reactive to light.  Neck: Neck supple.  Cardiovascular: Normal rate and normal heart sounds.  No murmur heard. Pulmonary/Chest: Effort normal and breath sounds normal. No  respiratory distress. No wheezes. She has no rales.  Abdominal: Soft. Bowel sounds are normal. No distension. There is no tenderness. There is no rebound.  Musculoskeletal: No edema.  Lymphadenopathy: none Neurological: No Focal deficits. Walks with no issues Has Expressive Aphasia   Skin: Skin is warm and dry.  Psychiatric: Normal mood and affect. Behavior is normal. Thought content normal.    Labs reviewed: Recent Labs    09/17/18 0000 09/18/18 0120 09/18/18 0500 09/19/18 0427 09/20/18 0338 09/25/18 0340 09/25/18 0340 09/26/18 0341 09/26/18 0341 09/27/18 0401 09/27/18 0401 09/28/18 0401 10/08/18 0000 01/08/19 0000 06/02/19 0000  NA   < >  --    < > 138   < > 139   < > 140   < > 138   < > 138 142 141 139  K   < >  --    < > 3.6   < > 3.8   < > 3.9   < > 3.7   < > 3.3* 4.1 4.4 4.1  CL  --   --    < > 105   < > 107   < > 105   < > 105   < > 102 103 104 103   CO2  --   --    < > 25   < > 24   < > 23   < > 25   < > 27 30 28  28*  GLUCOSE  --   --    < > 128*   < > 140*   < > 128*  --  113*  --  157*  --   --   --   BUN   < >  --    < > 5*   < > 6*   < > 9   < > 5*   < > 9 9 23* 20  CREATININE   < > 0.51   < > 0.46   < > 0.36*   < > 0.47   < > 0.38*   < > 0.55 0.5 0.7 0.6  CALCIUM  --   --    < > 7.5*   < > 8.8*   < > 8.8*   < > 8.0*   < > 8.0* 8.5 9.3 8.9  MG  --  1.9   < > 2.1   < > 1.9  --   --   --  1.8  --  2.3  --   --   --   PHOS  --  1.7*  --  1.5*  --   --   --   --   --   --   --   --   --   --   --    < > = values in this interval not displayed.   Recent Labs    09/17/18 1934 09/19/18 0427 09/24/18 0626 09/24/18 0626 09/27/18 0401 01/08/19 0000 06/02/19 0000  AST 48*  --  25   < > 20 17 17   ALT 29  --  20   < > 16 14 15   ALKPHOS 168*  --  86   < > 78 54 50  BILITOT 1.3*  --  0.2*  --  0.4  --   --   PROT 8.0  --  6.5  --  6.4* 6.8  --   ALBUMIN 2.7*   < > 2.3*   < > 2.3* 3.9  3.8   < > = values in this interval not displayed.   Recent Labs    09/25/18 0340 09/25/18 0340 09/26/18 0341 09/26/18 0341 09/28/18 0401 10/08/18 0000 11/05/18 0000 01/08/19 0000 06/02/19 0000  WBC 17.3*   < > 15.2*   < > 16.1*   < > 8.0 7.2 9.9  NEUTROABS 11.7*   < > 9.8*   < > 12.0*  --   --  3,542 5,495  HGB 10.0*   < > 10.0*   < > 9.8*   < > 10.2* 12.4 12.7  HCT 32.9*   < > 32.4*   < > 32.8*   < > 32* 38 37  MCV 89.6  --  91.3  --  90.6  --   --   --   --   PLT 407*   < > 393   < > 410*   < > 336 298 247   < > = values in this interval not displayed.   Lab Results  Component Value Date   TSH 3.01 01/08/2019   Lab Results  Component Value Date   HGBA1C 6.6 (H) 09/26/2018   Lab Results  Component Value Date   CHOL 275 (H) 04/06/2018   HDL 52 04/06/2018   LDLCALC 190 (H) 04/06/2018   TRIG 161 (H) 04/06/2018   CHOLHDL 5.3 (H) 04/06/2018    Significant Diagnostic Results in last 30 days:  No results  found.  Assessment/Plan  Essential hypertension BP stable Not on any meds Hyperlipidemia, unspecified hyperlipidemia type POA does not want any aggressive treatment Listed allergic to statin   Late onset Alzheimer's disease without behavioral disturbance ([facility]) ON Namenda and exelon patch  Depression Doing well on Lexapro  Esophageal dysphagia Doing good Taken off Fosamax No recent Episodes of Choking On Prilosec Prediabetes A1C was 5.9 Micro albumin normal  Family/ staff Communication:   Labs/tests ordered:

## 2019-08-13 ENCOUNTER — Non-Acute Institutional Stay (SKILLED_NURSING_FACILITY): Payer: Medicare PPO | Admitting: Nurse Practitioner

## 2019-08-13 ENCOUNTER — Encounter: Payer: Self-pay | Admitting: Nurse Practitioner

## 2019-08-13 DIAGNOSIS — F0280 Dementia in other diseases classified elsewhere without behavioral disturbance: Secondary | ICD-10-CM

## 2019-08-13 DIAGNOSIS — G301 Alzheimer's disease with late onset: Secondary | ICD-10-CM

## 2019-08-13 DIAGNOSIS — R7303 Prediabetes: Secondary | ICD-10-CM

## 2019-08-13 DIAGNOSIS — K219 Gastro-esophageal reflux disease without esophagitis: Secondary | ICD-10-CM

## 2019-08-13 DIAGNOSIS — F339 Major depressive disorder, recurrent, unspecified: Secondary | ICD-10-CM | POA: Diagnosis not present

## 2019-08-13 NOTE — Assessment & Plan Note (Signed)
Her mood is stable, continue  Escitalopram 10mg qd.   

## 2019-08-13 NOTE — Assessment & Plan Note (Signed)
Stable, continue Omeprazole.  

## 2019-08-13 NOTE — Progress Notes (Signed)
Location:  [facility] Room Number: 3-A Place of Service:  SNF (31) Provider:  Marlana Latus, NP  Patient Care Team: [name], MD as PCP - General (Internal Medicine) Lindwood Coke, MD as Consulting Physician (Dermatology) Christophe Louis, MD as Consulting Physician (Obstetrics and Gynecology) Latanya Maudlin, MD as Consulting Physician (Orthopedic Surgery) Garlan Fair, MD as Consulting Physician (Gastroenterology) Barnett Abu., MD as Consulting Physician (Cardiology) Neldon Mc Donnamarie Poag, MD as Consulting Physician (Allergy and Immunology) Rexene Alberts, MD as Consulting Physician (Cardiothoracic Surgery) Melida Quitter, MD as Consulting Physician (Otolaryngology) Ngetich, Nelda Bucks, NP as Nurse Practitioner (Family Medicine)  Extended Emergency Contact Information Primary Emergency Contact: Kathie Rhodes of [place] Phone: [phone] Mobile Phone: [phone] Relation: Legal Guardian Secondary Emergency Contact: Leana [place] of [place] Phone: [phone] Relation: Uncle  Code Status:  DNR Goals of care: Advanced Directive information Advanced Directives 08/13/2019  Does Patient Have a Medical Advance Directive? Yes  Type of Advance Directive Out of facility DNR (pink MOST or yellow form);Healthcare Power of Attorney  Does patient want to make changes to medical advance directive? No - Patient declined  Copy of [place] in Chart? Yes - validated most recent copy scanned in chart (See row information)  Pre-existing out of facility DNR order (yellow form or pink MOST form) Pink MOST form placed in chart (order not valid for inpatient use)     Chief Complaint  Patient presents with  . Medical Management of Chronic Issues    Routine [facility] SN[facility] visit  . Quality Metric Gaps    Diabetic eye exam, A1c, urine microalbumin. Patient is not on any diabetic meds, last A1c was  6.6 on 09/26/18.    HPI:  Pt is an 86 y.o. female seen today for medical management of chronic diseases.  The patient resides in SNF [facility] for safety, care assistance,  On Memantine 28mg  qd, Rivastigmine 9.5mg /24hr, her mood is stable, on Escitalopram 10mg  qd. GERD, stable, on Omeprazole 20mg  qd.    Past Medical History:  Diagnosis Date  . Abnormal CT scan, chest September 2011   Scar tissue  . Allergic rhinitis   . Alzheimer's dementia ([place])   . Asthma   . Diabetes mellitus    borderline and under control-diet ,exercise  . Esophageal stricture   . Family history of colon cancer    Mother  . GERD (gastroesophageal reflux disease)   . Hiatal hernia   . HLD (hyperlipidemia)   . Hypertension   . Osteopenia   . Pneumonia 12-15 yrs.ago   yrs. ago  . TIA (transient ischemic attack)   . Vitamin D deficiency    Past Surgical History:  Procedure Laterality Date  . ABDOMINAL HYSTERECTOMY  1980   . BALLOON DILATION N/A 09/27/2018   Procedure: BALLOON DILATION;  Surgeon: Jackquline Denmark, MD;  Location: [facility];  Service: Endoscopy;  Laterality: N/A;  . BIOPSY  09/27/2018   Procedure: BIOPSY;  Surgeon: Jackquline Denmark, MD;  Location: [facility];  Service: Endoscopy;;  . CATARACT EXTRACTION Left 2014  . ESOPHAGOGASTRODUODENOSCOPY (EGD) WITH PROPOFOL N/A 09/27/2018   Procedure: ESOPHAGOGASTRODUODENOSCOPY (EGD) WITH PROPOFOL;  Surgeon: Jackquline Denmark, MD;  Location: [facility];  Service: Endoscopy;  Laterality: N/A;  . SHOULDER OPEN ROTATOR CUFF REPAIR  05/22/2011   Procedure: ROTATOR CUFF REPAIR SHOULDER OPEN;  Surgeon: Tobi Bastos, MD;  Location: [facility] ORS;  Service: Orthopedics;  Laterality: Left;  . THORACOTOMY /  DECORTICATION PARIETAL PLEURA Right 204   empyema  . TONSILLECTOMY      Allergies  Allergen Reactions  . Aricept [Donepezil Hcl] Diarrhea  . Crestor [Rosuvastatin Calcium]     Myalgia  . Lipitor [Atorvastatin Calcium]   . Lovastatin     Myalgia  . Micardis  [Telmisartan]     Fatigue  . Sertraline Diarrhea  . Welchol [Colesevelam Hcl]     Constipation  . Zetia [Ezetimibe]     Myalgia  . Zocor [Simvastatin]     Myalgia    Outpatient Encounter Medications as of 08/13/2019  Medication Sig  . acetaminophen (TYLENOL) 325 MG tablet Take 325 mg by mouth every 6 (six) hours as needed for headache. Take Two Tablets ( 650 ) as needed.  Marland Kitchen aspirin EC 81 MG tablet Take 81 mg by mouth daily.  . Calcium Carbonate-Vitamin D (LIQUID CALCIUM/VITAMIN D) 600-200 MG-UNIT CAPS Take 1 tablet by mouth.  . cetirizine (ZYRTEC) 10 MG tablet Take 10 mg by mouth daily.  . chlorhexidine (PERIDEX) 0.12 % solution Use as directed 5 mLs in the mouth or throat in the morning.  . escitalopram (LEXAPRO) 10 MG tablet Take 1 tablet (10 mg total) by mouth daily.  . memantine (NAMENDA XR) 28 MG CP24 24 hr capsule TAKE ONE CAPSULE ONCE A DAY TO PRESERVE MEMORY.  Marland Kitchen omeprazole (PRILOSEC) 20 MG capsule Take 20 mg by mouth daily.  . rivastigmine (EXELON) 9.5 mg/24hr Apply fresh patch daily and remove old patch to help preserve memory  . Sodium Fluoride (PREVIDENT 5000 BOOSTER PLUS) 1.1 % PSTE Place 1 application onto teeth every evening.  . zinc oxide 20 % ointment Apply 1 application topically as needed for irritation. Apply to buttocks/peri topical as needed   No facility-administered encounter medications on file as of 08/13/2019.    Review of Systems  Constitutional: Negative for activity change, fever and unexpected weight change.       Weight gained about #10Ibs in the past month.   HENT: Positive for hearing loss. Negative for congestion and voice change.   Eyes: Negative for visual disturbance.  Respiratory: Negative for shortness of breath.   Cardiovascular: Negative for leg swelling.  Gastrointestinal: Negative for abdominal pain and constipation.  Genitourinary: Negative for difficulty urinating, dysuria and urgency.  Musculoskeletal: Positive for gait problem.  Skin:  Negative for color change.  Neurological: Negative for dizziness, speech difficulty, weakness and headaches.       Dementia  Psychiatric/Behavioral: Positive for confusion. Negative for behavioral problems and sleep disturbance. The patient is not nervous/anxious.     Immunization History  Administered Date(s) Administered  . Influenza, High Dose Seasonal PF 01/14/2019  . Influenza,inj,Quad PF,6+ Mos 12/29/2017  . Influenza-Unspecified 12/23/2013, 12/01/2014, 01/11/2016  . Moderna SARS-COVID-2 Vaccination 04/05/2019, 05/03/2019  . Pneumococcal Conjugate-13 11/22/2013  . Pneumococcal Polysaccharide-23 07/01/2006  . Tdap 12/17/2011  . Zoster 12/25/2005   Pertinent  Health Maintenance Due  Topic Date Due  . OPHTHALMOLOGY EXAM  Never done  . URINE MICROALBUMIN  02/11/2017  . HEMOGLOBIN A1C  03/28/2019  . INFLUENZA VACCINE  10/31/2019  . FOOT EXAM  04/28/2020  . DEXA SCAN  Completed  . PNA vac Low Risk Adult  Completed   Fall Risk  06/17/2018 04/22/2018 01/22/2017 07/23/2016 04/25/2015  Falls in the past year? - 0 No No No  Number falls in past yr: - 0 - - -  Injury with Fall? - 0 - - -  Risk for fall due to :  Impaired vision - - - -   Functional Status Survey:    Vitals:   08/13/19 0918  BP: 112/68  Pulse: 70  Resp: 20  Temp: 97.6 F (36.4 C)  TempSrc: Oral  SpO2: 98%  Weight: 147 lb 14.4 oz (67.1 kg)  Height: 5' (1.524 m)   Body mass index is 28.88 kg/m. Physical Exam Vitals and nursing note reviewed.  Constitutional:      Appearance: Normal appearance.  HENT:     Head: Normocephalic and atraumatic.     Jaw: No swelling or pain on movement.     Salivary Glands: Right salivary gland is not diffusely enlarged or tender. Left salivary gland is not diffusely enlarged or tender.     Mouth/Throat:     Mouth: Mucous membranes are moist.     Pharynx: No oropharyngeal exudate or posterior oropharyngeal erythema.  Eyes:     Extraocular Movements: Extraocular movements  intact.     Conjunctiva/sclera: Conjunctivae normal.     Pupils: Pupils are equal, round, and reactive to light.  Cardiovascular:     Rate and Rhythm: Normal rate and regular rhythm.     Heart sounds: No murmur.  Pulmonary:     Breath sounds: No wheezing, rhonchi or rales.  Abdominal:     General: Bowel sounds are normal.     Palpations: Abdomen is soft.     Tenderness: There is no abdominal tenderness. There is no rebound.  Musculoskeletal:     Cervical back: Normal range of motion and neck supple.     Right lower leg: No edema.     Left lower leg: No edema.  Skin:    General: Skin is warm and dry.  Neurological:     Mental Status: She is alert. Mental status is at baseline.     Motor: No weakness.     Coordination: Coordination normal.     Gait: Gait abnormal.     Comments: Oriented to self.   Psychiatric:     Comments: Pleasantly confused, but able to follow simple directions     Labs reviewed: Recent Labs    09/17/18 0000 09/18/18 0120 09/18/18 0500 09/19/18 0427 09/20/18 0338 09/25/18 0340 09/25/18 0340 09/26/18 0341 09/26/18 0341 09/27/18 0401 09/27/18 0401 09/28/18 0401 10/08/18 0000 01/08/19 0000 06/02/19 0000  NA   < >  --    < > 138   < > 139   < > 140   < > 138   < > 138 142 141 139  K   < >  --    < > 3.6   < > 3.8   < > 3.9   < > 3.7   < > 3.3* 4.1 4.4 4.1  CL  --   --    < > 105   < > 107   < > 105   < > 105   < > 102 103 104 103  CO2  --   --    < > 25   < > 24   < > 23   < > 25   < > 27 30 28  28*  GLUCOSE  --   --    < > 128*   < > 140*   < > 128*  --  113*  --  157*  --   --   --   BUN   < >  --    < > 5*   < > 6*   < >  9   < > 5*   < > 9 9 23* 20  CREATININE   < > 0.51   < > 0.46   < > 0.36*   < > 0.47   < > 0.38*   < > 0.55 0.5 0.7 0.6  CALCIUM  --   --    < > 7.5*   < > 8.8*   < > 8.8*   < > 8.0*   < > 8.0* 8.5 9.3 8.9  MG  --  1.9   < > 2.1   < > 1.9  --   --   --  1.8  --  2.3  --   --   --   PHOS  --  1.7*  --  1.5*  --   --   --   --    --   --   --   --   --   --   --    < > = values in this interval not displayed.   Recent Labs    09/17/18 1934 09/19/18 0427 09/24/18 0626 09/24/18 0626 09/27/18 0401 01/08/19 0000 06/02/19 0000  AST 48*  --  25   < > 20 17 17   ALT 29  --  20   < > 16 14 15   ALKPHOS 168*  --  86   < > 78 54 50  BILITOT 1.3*  --  0.2*  --  0.4  --   --   PROT 8.0  --  6.5  --  6.4* 6.8  --   ALBUMIN 2.7*   < > 2.3*   < > 2.3* 3.9 3.8   < > = values in this interval not displayed.   Recent Labs    09/25/18 0340 09/25/18 0340 09/26/18 0341 09/26/18 0341 09/28/18 0401 10/08/18 0000 11/05/18 0000 01/08/19 0000 06/02/19 0000  WBC 17.3*   < > 15.2*   < > 16.1*   < > 8.0 7.2 9.9  NEUTROABS 11.7*   < > 9.8*   < > 12.0*  --   --  3,542 5,495  HGB 10.0*   < > 10.0*   < > 9.8*   < > 10.2* 12.4 12.7  HCT 32.9*   < > 32.4*   < > 32.8*   < > 32* 38 37  MCV 89.6  --  91.3  --  90.6  --   --   --   --   PLT 407*   < > 393   < > 410*   < > 336 298 247   < > = values in this interval not displayed.   Lab Results  Component Value Date   TSH 3.01 01/08/2019   Lab Results  Component Value Date   HGBA1C 6.6 (H) 09/26/2018   Lab Results  Component Value Date   CHOL 275 (H) 04/06/2018   HDL 52 04/06/2018   LDLCALC 190 (H) 04/06/2018   TRIG 161 (H) 04/06/2018   CHOLHDL 5.3 (H) 04/06/2018    Significant Diagnostic Results in last 30 days:  No results found.  Assessment/Plan Prediabetes 06/24/19 Hgb a1c 5.9, urine micro albumin 14(<30), TSH 3.33 Will schedule eye exam   Alzheimer's dementia No behavioral issues, continue Memantine, Rivastigmine  for memory.   GERD (gastroesophageal reflux disease) Stable, continue Omeprazole.   Depression, recurrent ([place]) Her mood is stable, continue Escitalopram 10mg  qd.     Family/ staff Communication: plan of care  reviewed with the patient and charge nurse.   Labs/tests ordered:  none  Time spend 25 minutes.

## 2019-08-13 NOTE — Assessment & Plan Note (Signed)
06/24/19 Hgb a1c 5.9, urine micro albumin 14(<30), TSH 3.33 Will schedule eye exam

## 2019-08-13 NOTE — Assessment & Plan Note (Addendum)
No behavioral issues, continue Memantine, Rivastigmine  for memory.

## 2019-08-27 ENCOUNTER — Non-Acute Institutional Stay (SKILLED_NURSING_FACILITY): Payer: Medicare PPO | Admitting: Nurse Practitioner

## 2019-08-27 ENCOUNTER — Encounter: Payer: Self-pay | Admitting: Nurse Practitioner

## 2019-08-27 DIAGNOSIS — R1310 Dysphagia, unspecified: Secondary | ICD-10-CM

## 2019-08-27 DIAGNOSIS — K219 Gastro-esophageal reflux disease without esophagitis: Secondary | ICD-10-CM

## 2019-08-27 DIAGNOSIS — K222 Esophageal obstruction: Secondary | ICD-10-CM | POA: Diagnosis not present

## 2019-08-27 DIAGNOSIS — F339 Major depressive disorder, recurrent, unspecified: Secondary | ICD-10-CM

## 2019-08-27 DIAGNOSIS — T17308A Unspecified foreign body in larynx causing other injury, initial encounter: Secondary | ICD-10-CM | POA: Diagnosis not present

## 2019-08-27 DIAGNOSIS — F0280 Dementia in other diseases classified elsewhere without behavioral disturbance: Secondary | ICD-10-CM

## 2019-08-27 DIAGNOSIS — G301 Alzheimer's disease with late onset: Secondary | ICD-10-CM

## 2019-08-27 DIAGNOSIS — R1319 Other dysphagia: Secondary | ICD-10-CM

## 2019-08-27 NOTE — Assessment & Plan Note (Signed)
Contributory to the choking episode, ST to eval/tx.

## 2019-08-27 NOTE — Assessment & Plan Note (Addendum)
Stable, continue Omeprazole.  

## 2019-08-27 NOTE — Progress Notes (Signed)
Location:   [facility] Room Number: 3 Place of Service:  SNF [phone]) Provider:  Marlana Latus, NP  [name], MD  Patient Care Team: [name], MD as PCP - General (Internal Medicine) Lindwood Coke, MD as Consulting Physician (Dermatology) Christophe Louis, MD as Consulting Physician (Obstetrics and Gynecology) Latanya Maudlin, MD as Consulting Physician (Orthopedic Surgery) Garlan Fair, MD as Consulting Physician (Gastroenterology) Barnett Abu., MD as Consulting Physician (Cardiology) Neldon Mc Donnamarie Poag, MD as Consulting Physician (Allergy and Immunology) Rexene Alberts, MD as Consulting Physician (Cardiothoracic Surgery) Melida Quitter, MD as Consulting Physician (Otolaryngology) Ngetich, Nelda Bucks, NP as Nurse Practitioner (Family Medicine)  Extended Emergency Contact Information Primary Emergency Contact: Kathie Rhodes of [place] Phone: [phone] Mobile Phone: [phone] Relation: Legal Guardian Secondary Emergency Contact: Leana [place] of [place] Phone: [phone] Relation: Uncle  Code Status:  DNR Goals of care: Advanced Directive information Advanced Directives 08/27/2019  Does Patient Have a Medical Advance Directive? Yes  Type of Paramedic of [place];Out of facility DNR (pink MOST or yellow form)  Does patient want to make changes to medical advance directive? No - Patient declined  Copy of [place] in Chart? Yes - validated most recent copy scanned in chart (See row information)  Pre-existing out of facility DNR order (yellow form or pink MOST form) Pink MOST form placed in chart (order not valid for inpatient use)     Chief Complaint  Patient presents with  . Acute Visit    Chocking episode    HPI:  Pt is a 86 y.o. female seen today for an acute visit for a choking episode during dinner coughing out food last night, pending CXR.  The patient denied chest pain, SOB, cough, or vomiting. She is afebrile, no O2 desaturation.     the patient resides in SNF [facility] for safety, care assistance, on Memantine 28mg  qd, Rivastigmine 9.5mg /h24 for memory, her mood is stable, on Escitalopram 10mg  qd. GERD, stable, on Omeprazole 20mg  qd. Hx of dysphagia, esophageal stricture.    Past Medical History:  Diagnosis Date  . Abnormal CT scan, chest September 2011   Scar tissue  . Allergic rhinitis   . Alzheimer's dementia ([place])   . Asthma   . Diabetes mellitus    borderline and under control-diet ,exercise  . Esophageal stricture   . Family history of colon cancer    Mother  . GERD (gastroesophageal reflux disease)   . Hiatal hernia   . HLD (hyperlipidemia)   . Hypertension   . Osteopenia   . Pneumonia 12-15 yrs.ago   yrs. ago  . TIA (transient ischemic attack)   . Vitamin D deficiency    Past Surgical History:  Procedure Laterality Date  . ABDOMINAL HYSTERECTOMY  1980   . BALLOON DILATION N/A 09/27/2018   Procedure: BALLOON DILATION;  Surgeon: Jackquline Denmark, MD;  Location: [facility];  Service: Endoscopy;  Laterality: N/A;  . BIOPSY  09/27/2018   Procedure: BIOPSY;  Surgeon: Jackquline Denmark, MD;  Location: [facility];  Service: Endoscopy;;  . CATARACT EXTRACTION Left 2014  . ESOPHAGOGASTRODUODENOSCOPY (EGD) WITH PROPOFOL N/A 09/27/2018   Procedure: ESOPHAGOGASTRODUODENOSCOPY (EGD) WITH PROPOFOL;  Surgeon: Jackquline Denmark, MD;  Location: [facility];  Service: Endoscopy;  Laterality: N/A;  . SHOULDER OPEN ROTATOR CUFF REPAIR  05/22/2011   Procedure: ROTATOR CUFF REPAIR SHOULDER OPEN;  Surgeon: Tobi Bastos, MD;  Location: [facility];  Service: Orthopedics;  Laterality: Left;  . THORACOTOMY / DECORTICATION PARIETAL PLEURA Right 204   empyema  . TONSILLECTOMY      Allergies  Allergen Reactions  . Aricept [Donepezil Hcl] Diarrhea  . Crestor [Rosuvastatin Calcium]     Myalgia  . Lipitor [Atorvastatin Calcium]   . Lovastatin      Myalgia  . Micardis [Telmisartan]     Fatigue  . Sertraline Diarrhea  . Welchol [Colesevelam Hcl]     Constipation  . Zetia [Ezetimibe]     Myalgia  . Zocor [Simvastatin]     Myalgia    Allergies as of 08/27/2019      Reactions   Aricept [donepezil Hcl] Diarrhea   Crestor [rosuvastatin Calcium]    Myalgia   Lipitor [atorvastatin Calcium]    Lovastatin    Myalgia   Micardis [telmisartan]    Fatigue   Sertraline Diarrhea   Welchol [colesevelam Hcl]    Constipation   Zetia [ezetimibe]    Myalgia   Zocor [simvastatin]    Myalgia      Medication List       Accurate as of Aug 27, 2019 12:43 PM. If you have any questions, ask your nurse or doctor.        acetaminophen 325 MG tablet Commonly known as: TYLENOL Take 325 mg by mouth every 6 (six) hours as needed for headache. Take Two Tablets ( 650 ) as needed.   aspirin EC 81 MG tablet Take 81 mg by mouth daily.   cetirizine 10 MG tablet Commonly known as: ZYRTEC Take 10 mg by mouth daily.   chlorhexidine 0.12 % solution Commonly known as: PERIDEX Use as directed 5 mLs in the mouth or throat in the morning. Brush on 1 tsp. of solution to teeth and gums with a toothbrush after AM mouth care. Spit out excess and do not rinse.   escitalopram 10 MG tablet Commonly known as: LEXAPRO Take 1 tablet (10 mg total) by mouth daily.   Liquid Calcium/Vitamin D 600-200 MG-UNIT Caps Generic drug: Calcium Carbonate-Vitamin D Take 1 tablet by mouth.   memantine 28 MG Cp24 24 hr capsule Commonly known as: NAMENDA XR TAKE ONE CAPSULE ONCE A DAY TO PRESERVE MEMORY.   omeprazole 20 MG capsule Commonly known as: PRILOSEC Take 20 mg by mouth daily. Do not crush; Open capsule and sprinkle on applesauce   PreviDent 5000 Booster Plus 1.1 % Pste Generic drug: Sodium Fluoride Place 1 application onto teeth every evening.   rivastigmine 9.5 mg/24hr Commonly known as: EXELON Apply fresh patch daily and remove old patch to help  preserve memory   zinc oxide 20 % ointment Apply 1 application topically as needed for irritation. Apply to buttocks/peri topical as needed       Review of Systems  Constitutional: Negative for activity change and fever.  HENT: Positive for hearing loss and trouble swallowing. Negative for congestion and voice change.   Eyes: Negative for visual disturbance.  Respiratory: Positive for choking. Negative for cough, chest tightness, shortness of breath and wheezing.   Cardiovascular: Negative for leg swelling.  Gastrointestinal: Negative for abdominal pain, constipation, diarrhea, nausea and vomiting.  Genitourinary: Negative for dysuria and urgency.  Musculoskeletal: Positive for gait problem.  Skin: Negative for pallor.  Neurological: Negative for speech difficulty, weakness and light-headedness.       Dementia  Psychiatric/Behavioral: Positive for confusion. Negative for behavioral problems and sleep disturbance.    Immunization History  Administered Date(s) Administered  . Influenza, High Dose Seasonal  PF 01/14/2019  . Influenza,inj,Quad PF,6+ Mos 12/29/2017  . Influenza-Unspecified 12/23/2013, 12/01/2014, 01/11/2016  . Moderna SARS-COVID-2 Vaccination 04/05/2019, 05/03/2019  . Pneumococcal Conjugate-13 11/22/2013  . Pneumococcal Polysaccharide-23 07/01/2006  . Tdap 12/17/2011  . Zoster 12/25/2005   Pertinent  Health Maintenance Due  Topic Date Due  . OPHTHALMOLOGY EXAM  Never done  . URINE MICROALBUMIN  02/11/2017  . INFLUENZA VACCINE  10/31/2019  . HEMOGLOBIN A1C  12/25/2019  . FOOT EXAM  04/28/2020  . DEXA SCAN  Completed  . PNA vac Low Risk Adult  Completed   Fall Risk  06/17/2018 04/22/2018 01/22/2017 07/23/2016 04/25/2015  Falls in the past year? - 0 No No No  Number falls in past yr: - 0 - - -  Injury with Fall? - 0 - - -  Risk for fall due to : Impaired vision - - - -   Functional Status Survey:    Vitals:   08/27/19 1150  BP: 122/63  Pulse: 77  Resp: 20   Temp: 98.2 F (36.8 C)  SpO2: 96%  Weight: 147 lb 14.4 oz (67.1 kg)  Height: 5' (1.524 m)   Body mass index is 28.88 kg/m. Physical Exam Vitals and nursing note reviewed.  Constitutional:      Appearance: Normal appearance.  HENT:     Head: Normocephalic and atraumatic.     Jaw: No swelling or pain on movement.     Salivary Glands: Right salivary gland is not diffusely enlarged or tender. Left salivary gland is not diffusely enlarged or tender.     Nose: Nose normal.     Mouth/Throat:     Mouth: Mucous membranes are moist.     Pharynx: No oropharyngeal exudate or posterior oropharyngeal erythema.  Eyes:     Extraocular Movements: Extraocular movements intact.     Conjunctiva/sclera: Conjunctivae normal.     Pupils: Pupils are equal, round, and reactive to light.  Cardiovascular:     Rate and Rhythm: Normal rate and regular rhythm.     Heart sounds: No murmur.  Pulmonary:     Effort: Pulmonary effort is normal.     Breath sounds: No wheezing, rhonchi or rales.  Chest:     Chest wall: No tenderness.  Abdominal:     General: Bowel sounds are normal. There is no distension.     Palpations: Abdomen is soft.     Tenderness: There is no abdominal tenderness. There is no right CVA tenderness, left CVA tenderness, guarding or rebound.  Musculoskeletal:     Cervical back: Normal range of motion and neck supple.  Skin:    General: Skin is warm and dry.  Neurological:     Mental Status: She is alert. Mental status is at baseline.     Motor: No weakness.     Coordination: Coordination normal.     Gait: Gait abnormal.     Comments: Oriented to self.   Psychiatric:     Comments: Pleasantly confused, but able to follow simple directions     Labs reviewed: Recent Labs    09/17/18 0000 09/18/18 0120 09/18/18 0500 09/19/18 0427 09/20/18 0338 09/25/18 0340 09/25/18 0340 09/26/18 0341 09/26/18 0341 09/27/18 0401 09/27/18 0401 09/28/18 0401 10/08/18 0000 01/08/19 0000  06/02/19 0000  NA   < >  --    < > 138   < > 139   < > 140   < > 138   < > 138 142 141 139  K   < >  --    < >  3.6   < > 3.8   < > 3.9   < > 3.7   < > 3.3* 4.1 4.4 4.1  CL  --   --    < > 105   < > 107   < > 105   < > 105   < > 102 103 104 103  CO2  --   --    < > 25   < > 24   < > 23   < > 25   < > 27 30 28  28*  GLUCOSE  --   --    < > 128*   < > 140*   < > 128*  --  113*  --  157*  --   --   --   BUN   < >  --    < > 5*   < > 6*   < > 9   < > 5*   < > 9 9 23* 20  CREATININE   < > 0.51   < > 0.46   < > 0.36*   < > 0.47   < > 0.38*   < > 0.55 0.5 0.7 0.6  CALCIUM  --   --    < > 7.5*   < > 8.8*   < > 8.8*   < > 8.0*   < > 8.0* 8.5 9.3 8.9  MG  --  1.9   < > 2.1   < > 1.9  --   --   --  1.8  --  2.3  --   --   --   PHOS  --  1.7*  --  1.5*  --   --   --   --   --   --   --   --   --   --   --    < > = values in this interval not displayed.   Recent Labs    09/17/18 1934 09/19/18 0427 09/24/18 0626 09/24/18 0626 09/27/18 0401 01/08/19 0000 06/02/19 0000  AST 48*  --  25   < > 20 17 17   ALT 29  --  20   < > 16 14 15   ALKPHOS 168*  --  86   < > 78 54 50  BILITOT 1.3*  --  0.2*  --  0.4  --   --   PROT 8.0  --  6.5  --  6.4* 6.8  --   ALBUMIN 2.7*   < > 2.3*   < > 2.3* 3.9 3.8   < > = values in this interval not displayed.   Recent Labs    09/25/18 0340 09/25/18 0340 09/26/18 0341 09/26/18 0341 09/28/18 0401 10/08/18 0000 11/05/18 0000 01/08/19 0000 06/02/19 0000  WBC 17.3*   < > 15.2*   < > 16.1*   < > 8.0 7.2 9.9  NEUTROABS 11.7*   < > 9.8*   < > 12.0*  --   --  3,542 5,495  HGB 10.0*   < > 10.0*   < > 9.8*   < > 10.2* 12.4 12.7  HCT 32.9*   < > 32.4*   < > 32.8*   < > 32* 38 37  MCV 89.6  --  91.3  --  90.6  --   --   --   --   PLT 407*   < > 393   < > 410*   < >  336 298 247   < > = values in this interval not displayed.   Lab Results  Component Value Date   TSH 3.33 06/24/2019   Lab Results  Component Value Date   HGBA1C 5.9 06/24/2019   Lab Results   Component Value Date   CHOL 275 (H) 04/06/2018   HDL 52 04/06/2018   LDLCALC 190 (H) 04/06/2018   TRIG 161 (H) 04/06/2018   CHOLHDL 5.3 (H) 04/06/2018    Significant Diagnostic Results in last 30 days:  No results found.  Assessment/Plan Choking Choking episode during dinner coughing out food, pending CXR. ST to eval/tx  Dysphagia Contributory to the choking episode, ST to eval/tx.   GERD (gastroesophageal reflux disease) Stable, continue Omeprazole.   Esophageal stricture 08/2018 esophageal dilatation.    Alzheimer's dementia Continue SNF [facility] for safety, care assistance, continue Memantine, Rivastigmine for memory.  Depression, recurrent ([place]) Her mood is stable, continue Escitalopram     Family/ staff Communication: plan of care reviewed with the patient and charge nurse.   Labs/tests ordered:  Pending CXR  Time spend 25 minutes.

## 2019-08-27 NOTE — Assessment & Plan Note (Addendum)
Choking episode during dinner coughing out food, pending CXR. ST to eval/tx

## 2019-08-27 NOTE — Assessment & Plan Note (Signed)
08/2018 esophageal dilatation.

## 2019-08-27 NOTE — Assessment & Plan Note (Signed)
Continue SNF [facility] for safety, care assistance, continue Memantine, Rivastigmine for memory.

## 2019-08-27 NOTE — Assessment & Plan Note (Signed)
Her mood is stable, continue Escitalopram

## 2019-09-14 ENCOUNTER — Encounter: Payer: Self-pay | Admitting: Nurse Practitioner

## 2019-09-14 ENCOUNTER — Non-Acute Institutional Stay (SKILLED_NURSING_FACILITY): Payer: Medicare PPO | Admitting: Nurse Practitioner

## 2019-09-14 DIAGNOSIS — F0280 Dementia in other diseases classified elsewhere without behavioral disturbance: Secondary | ICD-10-CM

## 2019-09-14 DIAGNOSIS — F339 Major depressive disorder, recurrent, unspecified: Secondary | ICD-10-CM

## 2019-09-14 DIAGNOSIS — G301 Alzheimer's disease with late onset: Secondary | ICD-10-CM | POA: Diagnosis not present

## 2019-09-14 DIAGNOSIS — K219 Gastro-esophageal reflux disease without esophagitis: Secondary | ICD-10-CM

## 2019-09-14 DIAGNOSIS — I10 Essential (primary) hypertension: Secondary | ICD-10-CM | POA: Diagnosis not present

## 2019-09-14 NOTE — Assessment & Plan Note (Signed)
Advanced, continue SNF [facility] for safety, care assistance, continue Memantine, Rivastigmine for memory.

## 2019-09-14 NOTE — Progress Notes (Signed)
Location:   SNF [place] Room Number: 3 Place of Service:  SNF (31) Provider: [facility] Benancio Osmundson NP  [name], MD  Patient Care Team: [name], MD as PCP - General (Internal Medicine) Lindwood Coke, MD as Consulting Physician (Dermatology) Christophe Louis, MD as Consulting Physician (Obstetrics and Gynecology) Latanya Maudlin, MD as Consulting Physician (Orthopedic Surgery) Garlan Fair, MD as Consulting Physician (Gastroenterology) Barnett Abu., MD as Consulting Physician (Cardiology) Neldon Mc Donnamarie Poag, MD as Consulting Physician (Allergy and Immunology) Rexene Alberts, MD as Consulting Physician (Cardiothoracic Surgery) Melida Quitter, MD as Consulting Physician (Otolaryngology) Ngetich, Nelda Bucks, NP as Nurse Practitioner (Family Medicine)  Extended Emergency Contact Information Primary Emergency Contact: Kathie Rhodes of [place] Phone: [phone] Mobile Phone: [phone] Relation: Legal Guardian Secondary Emergency Contact: Leana Gamer [place] of [place] Phone: [phone] Relation: Uncle  Code Status:  DNR Goals of care: Advanced Directive information Advanced Directives 09/14/2019  Does Patient Have a Medical Advance Directive? Yes  Type of Paramedic of [place];Out of facility DNR (pink MOST or yellow form)  Does patient want to make changes to medical advance directive? No - Patient declined  Copy of [place] in Chart? Yes - validated most recent copy scanned in chart (See row information)  Pre-existing out of facility DNR order (yellow form or pink MOST form) Pink MOST form placed in chart (order not valid for inpatient use)     Chief Complaint  Patient presents with  . Medical Management of Chronic Issues    HPI:  Pt is a 86 y.o. female seen today for medical management of chronic diseases.     The patient resides in SNF [facility] for safety, care  assistance,  On Memantine 28mg  qd, Rivastigmine 9.5mg /24hr, her mood is stable, on Escitalopram 10mg  qd. GERD, stable, on Omeprazole 20mg  qd.   Past Medical History:  Diagnosis Date  . Abnormal CT scan, chest September 2011   Scar tissue  . Allergic rhinitis   . Alzheimer's dementia ([place])   . Asthma   . Diabetes mellitus    borderline and under control-diet ,exercise  . Esophageal stricture   . Family history of colon cancer    Mother  . GERD (gastroesophageal reflux disease)   . Hiatal hernia   . HLD (hyperlipidemia)   . Hypertension   . Osteopenia   . Pneumonia 12-15 yrs.ago   yrs. ago  . TIA (transient ischemic attack)   . Vitamin D deficiency    Past Surgical History:  Procedure Laterality Date  . ABDOMINAL HYSTERECTOMY  1980   . BALLOON DILATION N/A 09/27/2018   Procedure: BALLOON DILATION;  Surgeon: Jackquline Denmark, MD;  Location: [facility];  Service: Endoscopy;  Laterality: N/A;  . BIOPSY  09/27/2018   Procedure: BIOPSY;  Surgeon: Jackquline Denmark, MD;  [facility]: [facility];  Service: Endoscopy;;  . CATARACT EXTRACTION Left 2014  . ESOPHAGOGASTRODUODENOSCOPY (EGD) WITH PROPOFOL N/A 09/27/2018   Procedure: ESOPHAGOGASTRODUODENOSCOPY (EGD) WITH PROPOFOL;  Surgeon: Jackquline Denmark, MD;  [facility];  Service: Endoscopy;  Laterality: N/A;  . SHOULDER OPEN ROTATOR CUFF REPAIR  05/22/2011   Procedure: ROTATOR CUFF REPAIR SHOULDER OPEN;  Surgeon: Tobi Bastos, MD;  Location: [facility];  Service: Orthopedics;  Laterality: Left;  . THORACOTOMY / DECORTICATION PARIETAL PLEURA Right 204   empyema  . TONSILLECTOMY      Allergies  Allergen Reactions  . Aricept [Donepezil Hcl] Diarrhea  . Crestor [employer]  Calcium]     Myalgia  . Lipitor [Atorvastatin Calcium]   . Lovastatin     Myalgia  . Micardis [Telmisartan]     Fatigue  . Sertraline Diarrhea  . Welchol [Colesevelam Hcl]     Constipation  . Zetia [Ezetimibe]     Myalgia  . Zocor [Simvastatin]      Myalgia    Allergies as of 09/14/2019      Reactions   Aricept [donepezil Hcl] Diarrhea   Crestor [rosuvastatin Calcium]    Myalgia   Lipitor [atorvastatin Calcium]    Lovastatin    Myalgia   Micardis [telmisartan]    Fatigue   Sertraline Diarrhea   Welchol [colesevelam Hcl]    Constipation   Zetia [ezetimibe]    Myalgia   Zocor [simvastatin]    Myalgia      Medication List       Accurate as of September 14, 2019 11:59 PM. If you have any questions, ask your nurse or doctor.        acetaminophen 325 MG tablet Commonly known as: TYLENOL Take 325 mg by mouth every 6 (six) hours as needed for headache. Take Two Tablets ( 650 ) as needed.   aspirin EC 81 MG tablet Take 81 mg by mouth daily.   cetirizine 10 MG tablet Commonly known as: ZYRTEC Take 10 mg by mouth daily.   chlorhexidine 0.12 % solution Commonly known as: PERIDEX Use as directed 5 mLs in the mouth or throat in the morning. Brush on 1 tsp. of solution to teeth and gums with a toothbrush after AM mouth care. Spit out excess and do not rinse.   escitalopram 10 MG tablet Commonly known as: LEXAPRO Take 1 tablet (10 mg total) by mouth daily.   Liquid Calcium/Vitamin D 600-200 MG-UNIT Caps Generic drug: Calcium Carbonate-Vitamin D Take 1 tablet by mouth.   memantine 28 MG Cp24 24 hr capsule Commonly known as: NAMENDA XR TAKE ONE CAPSULE ONCE A DAY TO PRESERVE MEMORY.   omeprazole 20 MG capsule Commonly known as: PRILOSEC Take 20 mg by mouth daily. Do not crush; Open capsule and sprinkle on applesauce   PreviDent 5000 Booster Plus 1.1 % Pste Generic drug: Sodium Fluoride Place 1 application onto teeth every evening.   rivastigmine 9.5 mg/24hr Commonly known as: EXELON Apply fresh patch daily and remove old patch to help preserve memory   zinc oxide 20 % ointment Apply 1 application topically as needed for irritation. Apply to buttocks/peri topical as needed       Review of Systems    Constitutional: Negative for fatigue, fever and unexpected weight change.  HENT: Positive for hearing loss. Negative for congestion and voice change.   Eyes: Negative for visual disturbance.  Respiratory: Negative for cough, shortness of breath and wheezing.   Cardiovascular: Negative for leg swelling.  Gastrointestinal: Negative for abdominal pain and constipation.  Genitourinary: Negative for dysuria and urgency.  Musculoskeletal: Positive for gait problem.  Skin: Negative for color change.  Neurological: Negative for dizziness, speech difficulty and headaches.       Dementia  Psychiatric/Behavioral: Positive for confusion. Negative for agitation and sleep disturbance. The patient is not nervous/anxious.     Immunization History  Administered Date(s) Administered  . Influenza, High Dose Seasonal PF 01/14/2019  . Influenza,inj,Quad PF,6+ Mos 12/29/2017  . Influenza-Unspecified 12/23/2013, 12/01/2014, 01/11/2016  . Moderna SARS-COVID-2 Vaccination 04/05/2019, 05/03/2019  . Pneumococcal Conjugate-13 11/22/2013  . Pneumococcal Polysaccharide-23 07/01/2006  . Tdap 12/17/2011  . Zoster  12/25/2005   Pertinent  Health Maintenance Due  Topic Date Due  . OPHTHALMOLOGY EXAM  Never done  . URINE MICROALBUMIN  02/11/2017  . INFLUENZA VACCINE  10/31/2019  . HEMOGLOBIN A1C  12/25/2019  . FOOT EXAM  04/28/2020  . DEXA SCAN  Completed  . PNA vac Low Risk Adult  Completed   Fall Risk  06/17/2018 04/22/2018 01/22/2017 07/23/2016 04/25/2015  Falls in the past year? - 0 No No No  Number falls in past yr: - 0 - - -  Injury with Fall? - 0 - - -  Risk for fall due to : Impaired vision - - - -   Functional Status Survey:    Vitals:   09/14/19 1635  BP: (!) 110/59  Pulse: 83  Resp: (!) 22  Temp: 97.8 F (36.6 C)  SpO2: 94%  Weight: 143 lb 4.8 oz (65 kg)  Height: 5' (1.524 m)   Body mass index is 27.99 kg/m. Physical Exam Vitals and nursing note reviewed.  Constitutional:       Appearance: Normal appearance.  HENT:     Head: Normocephalic and atraumatic.     Jaw: No swelling or pain on movement.     Salivary Glands: Right salivary gland is not diffusely enlarged or tender. Left salivary gland is not diffusely enlarged or tender.     Mouth/Throat:     Mouth: Mucous membranes are moist.     Pharynx: Oropharyngeal exudate present.  Eyes:     Extraocular Movements: Extraocular movements intact.     Conjunctiva/sclera: Conjunctivae normal.     Pupils: Pupils are equal, round, and reactive to light.  Cardiovascular:     Rate and Rhythm: Normal rate and regular rhythm.     Heart sounds: No murmur heard.   Pulmonary:     Effort: Pulmonary effort is normal.     Breath sounds: No wheezing or rales.  Abdominal:     General: Bowel sounds are normal.     Palpations: Abdomen is soft.     Tenderness: There is no abdominal tenderness.  Musculoskeletal:     Cervical back: Normal range of motion and neck supple.     Right lower leg: No edema.     Left lower leg: No edema.  Skin:    General: Skin is warm and dry.  Neurological:     Mental Status: She is alert. Mental status is at baseline.     Coordination: Coordination normal.     Gait: Gait abnormal.     Comments: Oriented to self.   Psychiatric:     Comments: Pleasantly confused, but able to follow simple directions     Labs reviewed: Recent Labs    09/17/18 0000 09/18/18 0120 09/18/18 0500 09/19/18 0427 09/20/18 0338 09/25/18 0340 09/25/18 0340 09/26/18 0341 09/26/18 0341 09/27/18 0401 09/27/18 0401 09/28/18 0401 10/08/18 0000 01/08/19 0000 06/02/19 0000  NA   < >  --    < > 138   < > 139   < > 140   < > 138   < > 138 142 141 139  K   < >  --    < > 3.6   < > 3.8   < > 3.9   < > 3.7   < > 3.3* 4.1 4.4 4.1  CL  --   --    < > 105   < > 107   < > 105   < > 105   < >  102 103 104 103  CO2  --   --    < > 25   < > 24   < > 23   < > 25   < > 27 30 28  28*  GLUCOSE  --   --    < > 128*   < > 140*   < >  128*  --  113*  --  157*  --   --   --   BUN   < >  --    < > 5*   < > 6*   < > 9   < > 5*   < > 9 9 23* 20  CREATININE   < > 0.51   < > 0.46   < > 0.36*   < > 0.47   < > 0.38*   < > 0.55 0.5 0.7 0.6  CALCIUM  --   --    < > 7.5*   < > 8.8*   < > 8.8*   < > 8.0*   < > 8.0* 8.5 9.3 8.9  MG  --  1.9   < > 2.1   < > 1.9  --   --   --  1.8  --  2.3  --   --   --   PHOS  --  1.7*  --  1.5*  --   --   --   --   --   --   --   --   --   --   --    < > = values in this interval not displayed.   Recent Labs    09/17/18 1934 09/19/18 0427 09/24/18 0626 09/24/18 0626 09/27/18 0401 01/08/19 0000 06/02/19 0000  AST 48*  --  25   < > 20 17 17   ALT 29  --  20   < > 16 14 15   ALKPHOS 168*  --  86   < > 78 54 50  BILITOT 1.3*  --  0.2*  --  0.4  --   --   PROT 8.0  --  6.5  --  6.4* 6.8  --   ALBUMIN 2.7*   < > 2.3*   < > 2.3* 3.9 3.8   < > = values in this interval not displayed.   Recent Labs    09/25/18 0340 09/25/18 0340 09/26/18 0341 09/26/18 0341 09/28/18 0401 10/08/18 0000 11/05/18 0000 01/08/19 0000 06/02/19 0000  WBC 17.3*   < > 15.2*   < > 16.1*   < > 8.0 7.2 9.9  NEUTROABS 11.7*   < > 9.8*   < > 12.0*  --   --  3,542 5,495  HGB 10.0*   < > 10.0*   < > 9.8*   < > 10.2* 12.4 12.7  HCT 32.9*   < > 32.4*   < > 32.8*   < > 32* 38 37  MCV 89.6  --  91.3  --  90.6  --   --   --   --   PLT 407*   < > 393   < > 410*   < > 336 298 247   < > = values in this interval not displayed.   Lab Results  Component Value Date   TSH 3.33 06/24/2019   Lab Results  Component Value Date   HGBA1C 5.9 06/24/2019   Lab Results  Component Value Date   CHOL  275 (H) 04/06/2018   HDL 52 04/06/2018   LDLCALC 190 (H) 04/06/2018   TRIG 161 (H) 04/06/2018   CHOLHDL 5.3 (H) 04/06/2018    Significant Diagnostic Results in last 30 days:  No results found.  Assessment/Plan Hypertension Blood pressure is controlled, not taking antihypertensive agent, continue ASA 81mg  qd.   GERD  (gastroesophageal reflux disease) Stable, continue Omeprazole 20mg  qd  Alzheimer's dementia Advanced, continue SNF [facility] for safety, care assistance, continue Memantine, Rivastigmine for memory.   Depression, recurrent ([place]) Her mood is stable, continue Marketing executive Communication: plan of care reviewed with the patient and charge nurse.   Labs/tests ordered:  none  Time spend 25 minutes.

## 2019-09-14 NOTE — Assessment & Plan Note (Signed)
Blood pressure is controlled, not taking antihypertensive agent, continue ASA 81mg  qd.

## 2019-09-14 NOTE — Assessment & Plan Note (Signed)
Her mood is stable, continue Escitralpram

## 2019-09-14 NOTE — Assessment & Plan Note (Signed)
Stable, continue Omeprazole 20mg qd.  

## 2019-09-15 ENCOUNTER — Encounter: Payer: Self-pay | Admitting: Nurse Practitioner

## 2019-10-14 ENCOUNTER — Encounter: Payer: Self-pay | Admitting: Internal Medicine

## 2019-10-14 ENCOUNTER — Non-Acute Institutional Stay (SKILLED_NURSING_FACILITY): Payer: Medicare PPO | Admitting: Internal Medicine

## 2019-10-14 DIAGNOSIS — R1319 Other dysphagia: Secondary | ICD-10-CM

## 2019-10-14 DIAGNOSIS — R1310 Dysphagia, unspecified: Secondary | ICD-10-CM

## 2019-10-14 DIAGNOSIS — E785 Hyperlipidemia, unspecified: Secondary | ICD-10-CM | POA: Diagnosis not present

## 2019-10-14 DIAGNOSIS — M810 Age-related osteoporosis without current pathological fracture: Secondary | ICD-10-CM | POA: Diagnosis not present

## 2019-10-14 DIAGNOSIS — F0280 Dementia in other diseases classified elsewhere without behavioral disturbance: Secondary | ICD-10-CM

## 2019-10-14 DIAGNOSIS — K219 Gastro-esophageal reflux disease without esophagitis: Secondary | ICD-10-CM

## 2019-10-14 DIAGNOSIS — G301 Alzheimer's disease with late onset: Secondary | ICD-10-CM

## 2019-10-14 NOTE — Progress Notes (Signed)
Location:  [place] Room Number: 3-A Place of Service:  SNF [phone]) Provider:  [name], MD  Patient Care Team: [name], MD as PCP - General (Internal Medicine) Lindwood Coke, MD as Consulting Physician (Dermatology) Christophe Louis, MD as Consulting Physician (Obstetrics and Gynecology) Latanya Maudlin, MD as Consulting Physician (Orthopedic Surgery) Garlan Fair, MD as Consulting Physician (Gastroenterology) Barnett Abu., MD as Consulting Physician (Cardiology) Neldon Mc Donnamarie Poag, MD as Consulting Physician (Allergy and Immunology) Rexene Alberts, MD as Consulting Physician (Cardiothoracic Surgery) Melida Quitter, MD as Consulting Physician (Otolaryngology) Ngetich, Nelda Bucks, NP as Nurse Practitioner (Family Medicine)  Extended Emergency Contact Information Primary Emergency Contact: Kathie Rhodes of [place] Phone: [phone] Mobile Phone: [phone] Relation: Legal Guardian Secondary Emergency Contact: Leana Gamer [place] of [place] Phone: [phone] Relation: Uncle  Code Status:  DNR Goals of care: Advanced Directive information Advanced Directives 09/14/2019  Does Patient Have a Medical Advance Directive? Yes  Type of Paramedic of [place];Out of facility DNR (pink MOST or yellow form)  Does patient want to make changes to medical advance directive? No - Patient declined  Copy of [place] in Chart? Yes - validated most recent copy scanned in chart (See row information)  Pre-existing out of facility DNR order (yellow form or pink MOST form) Pink MOST form placed in chart (order not valid for inpatient use)     Chief Complaint  Patient presents with  . Medical Management of Chronic Issues    Routine [facility] SNF visit    HPI:  Pt is an 86 y.o. female seen today for medical management of chronic diseases.    Patient has h/o  Hyperlipidemia, Osteoporosis, Allergic Rhinitis and Dementia Alzheimer, Dysphagia s/p Dilatation 6/20 Has h/o Choking Episodes  Long term Care Patient . No Recent Nursing issues. Has lost some weight recently but Appetite is fair Walks with no assist No falls.  Past Medical History:  Diagnosis Date  . Abnormal CT scan, chest September 2011   Scar tissue  . Allergic rhinitis   . Alzheimer's dementia ([place])   . Asthma   . Diabetes mellitus    borderline and under control-diet ,exercise  . Esophageal stricture   . Family history of colon cancer    Mother  . GERD (gastroesophageal reflux disease)   . Hiatal hernia   . HLD (hyperlipidemia)   . Hypertension   . Osteopenia   . Pneumonia 12-15 yrs.ago   yrs. ago  . TIA (transient ischemic attack)   . Vitamin D deficiency    Past Surgical History:  Procedure Laterality Date  . ABDOMINAL HYSTERECTOMY  1980   . BALLOON DILATION N/A 09/27/2018   Procedure: BALLOON DILATION;  Surgeon: Jackquline Denmark, MD;  [facility];  Service: Endoscopy;  Laterality: N/A;  . BIOPSY  09/27/2018   Procedure: BIOPSY;  Surgeon: Jackquline Denmark, MD;  [facility];  Service: Endoscopy;;  . CATARACT EXTRACTION Left 2014  . ESOPHAGOGASTRODUODENOSCOPY (EGD) WITH PROPOFOL N/A 09/27/2018   Procedure: ESOPHAGOGASTRODUODENOSCOPY (EGD) WITH PROPOFOL;  Surgeon: Jackquline Denmark, MD;  [facility];  Service: Endoscopy;  Laterality: N/A;  . SHOULDER OPEN ROTATOR CUFF REPAIR  05/22/2011   Procedure: ROTATOR CUFF REPAIR SHOULDER OPEN;  Surgeon: Tobi Bastos, MD;  [facility];  Service: Orthopedics;  Laterality: Left;  . THORACOTOMY / DECORTICATION PARIETAL PLEURA Right 204   empyema  . TONSILLECTOMY  Allergies  Allergen Reactions  . Aricept [Donepezil Hcl] Diarrhea  . Crestor [Rosuvastatin Calcium]     Myalgia  . Lipitor [Atorvastatin Calcium]   . Lovastatin     Myalgia  . Micardis [Telmisartan]     Fatigue  . Sertraline  Diarrhea  . Welchol [Colesevelam Hcl]     Constipation  . Zetia [Ezetimibe]     Myalgia  . Zocor [Simvastatin]     Myalgia    Outpatient Encounter Medications as of 10/14/2019  Medication Sig  . acetaminophen (TYLENOL) 325 MG tablet Take 325 mg by mouth every 6 (six) hours as needed for headache. Take Two Tablets ( 650 ) as needed.  Marland Kitchen aspirin EC 81 MG tablet Take 81 mg by mouth daily.  . Calcium Carbonate-Vitamin D (LIQUID CALCIUM/VITAMIN D) 600-200 MG-UNIT CAPS Take 1 tablet by mouth.  . cetirizine (ZYRTEC) 10 MG tablet Take 10 mg by mouth daily.  . chlorhexidine (PERIDEX) 0.12 % solution Use as directed 5 mLs in the mouth or throat in the morning. Brush on 1 tsp. of solution to teeth and gums with a toothbrush after AM mouth care. Spit out excess and do not rinse.  . escitalopram (LEXAPRO) 10 MG tablet Take 1 tablet (10 mg total) by mouth daily.  . memantine (NAMENDA XR) 28 MG CP24 24 hr capsule TAKE ONE CAPSULE ONCE A DAY TO PRESERVE MEMORY.  . Multiple Vitamins-Minerals (PRESERVISION AREDS 2) CHEW Chew 1 tablet by mouth in the morning and at bedtime.  Marland Kitchen omeprazole (PRILOSEC) 20 MG capsule Take 20 mg by mouth daily. Do not crush; Open capsule and sprinkle on applesauce  . rivastigmine (EXELON) 9.5 mg/24hr Apply fresh patch daily and remove old patch to help preserve memory  . Sodium Fluoride (PREVIDENT 5000 BOOSTER PLUS) 1.1 % PSTE Place 1 application onto teeth every evening.  . zinc oxide 20 % ointment Apply 1 application topically as needed for irritation. Apply to buttocks/peri topical as needed   No facility-administered encounter medications on file as of 10/14/2019.    Review of Systems  Unable to perform ROS: Dementia    Immunization History  Administered Date(s) Administered  . Influenza, High Dose Seasonal PF 01/14/2019  . Influenza,inj,Quad PF,6+ Mos 12/29/2017  . Influenza-Unspecified 12/23/2013, 12/01/2014, 01/11/2016  . Moderna SARS-COVID-2 Vaccination 04/05/2019,  05/03/2019  . Pneumococcal Conjugate-13 11/22/2013  . Pneumococcal Polysaccharide-23 07/01/2006  . Tdap 12/17/2011  . Zoster 12/25/2005   Pertinent  Health Maintenance Due  Topic Date Due  . OPHTHALMOLOGY EXAM  Never done  . URINE MICROALBUMIN  02/11/2017  . INFLUENZA VACCINE  10/31/2019  . HEMOGLOBIN A1C  12/25/2019  . FOOT EXAM  04/28/2020  . DEXA SCAN  Completed  . PNA vac Low Risk Adult  Completed   Fall Risk  06/17/2018 04/22/2018 01/22/2017 07/23/2016 04/25/2015  Falls in the past year? - 0 No No No  Number falls in past yr: - 0 - - -  Injury with Fall? - 0 - - -  Risk for fall due to : Impaired vision - - - -   Functional Status Survey:    Vitals:   10/14/19 1537  BP: (!) 108/54  Pulse: 61  Resp: 16  Temp: (!) 97.3 F (36.3 C)  TempSrc: Oral  SpO2: 94%  Weight: 146 lb (66.2 kg)  Height: 5' (1.524 m)   Body mass index is 28.51 kg/m. Physical Exam Vitals reviewed.  Constitutional:      Appearance: Normal appearance.  HENT:  Head: Normocephalic.     Nose: Nose normal.     Mouth/Throat:     Mouth: Mucous membranes are moist.  Eyes:     Pupils: Pupils are equal, round, and reactive to light.  Cardiovascular:     Rate and Rhythm: Normal rate and regular rhythm.     Pulses: Normal pulses.  Pulmonary:     Effort: Pulmonary effort is normal.     Breath sounds: Normal breath sounds.  Abdominal:     General: Abdomen is flat. Bowel sounds are normal.     Palpations: Abdomen is soft.  Musculoskeletal:        General: No swelling.     Cervical back: Neck supple.  Skin:    General: Skin is warm.  Neurological:     General: No focal deficit present.     Mental Status: She is alert.     Comments: No Focal deficits. Walks with no issues Has Expressive Aphasia    Psychiatric:        Mood and Affect: Mood normal.        Thought Content: Thought content normal.     Labs reviewed: Recent Labs    01/08/19 0000 06/02/19 0000  NA 141 139  K 4.4 4.1  CL  104 103  CO2 28 28*  BUN 23* 20  CREATININE 0.7 0.6  CALCIUM 9.3 8.9   Recent Labs    01/08/19 0000 06/02/19 0000  AST 17 17  ALT 14 15  ALKPHOS 54 50  PROT 6.8  --   ALBUMIN 3.9 3.8   Recent Labs    11/05/18 0000 01/08/19 0000 06/02/19 0000  WBC 8.0 7.2 9.9  NEUTROABS  --  3,542 5,495  HGB 10.2* 12.4 12.7  HCT 32* 38 37  PLT 336 298 247   Lab Results  Component Value Date   TSH 3.33 06/24/2019   Lab Results  Component Value Date   HGBA1C 5.9 06/24/2019   Lab Results  Component Value Date   CHOL 275 (H) 04/06/2018   HDL 52 04/06/2018   LDLCALC 190 (H) 04/06/2018   TRIG 161 (H) 04/06/2018   CHOLHDL 5.3 (H) 04/06/2018    Significant Diagnostic Results in last 30 days:  No results found.  Assessment/Plan  Hyperlipidemia, unspecified hyperlipidemia type POA has refused statin  Late onset Alzheimer's disease without behavioral disturbance ([place]) ON Namenda and exelon patch Staying stable continue supportive care Depression Doing well on Lexapro Was taken off Remeron due to weight gain  Esophageal dysphagia Had 1 more episode few months ago. Continues on Prilosec Prediabetes A1C was 5.9 Micro albumin normal Osteoporosis Was taken off Fosamax due to dysphagia  Family/ staff Communication:   Labs/tests ordered:

## 2019-11-15 ENCOUNTER — Encounter: Payer: Self-pay | Admitting: Nurse Practitioner

## 2019-11-15 ENCOUNTER — Non-Acute Institutional Stay (SKILLED_NURSING_FACILITY): Payer: Medicare PPO | Admitting: Nurse Practitioner

## 2019-11-15 DIAGNOSIS — F0280 Dementia in other diseases classified elsewhere without behavioral disturbance: Secondary | ICD-10-CM

## 2019-11-15 DIAGNOSIS — G301 Alzheimer's disease with late onset: Secondary | ICD-10-CM | POA: Diagnosis not present

## 2019-11-15 DIAGNOSIS — J309 Allergic rhinitis, unspecified: Secondary | ICD-10-CM

## 2019-11-15 DIAGNOSIS — K219 Gastro-esophageal reflux disease without esophagitis: Secondary | ICD-10-CM | POA: Diagnosis not present

## 2019-11-15 DIAGNOSIS — F339 Major depressive disorder, recurrent, unspecified: Secondary | ICD-10-CM | POA: Diagnosis not present

## 2019-11-15 NOTE — Assessment & Plan Note (Signed)
No behavioral issues, continue SNF [facility] for safety, care assistance, continue Memantine, Rivastigmine for memory.

## 2019-11-15 NOTE — Progress Notes (Signed)
Location:    [place] Room Number: 3 Place of Service:  SNF (31) Provider: Lennie Odor Makalah Asberry NP  [name], MD  Patient Care Team: [name], MD as PCP - General (Internal Medicine) Lindwood Coke, MD as Consulting Physician (Dermatology) Christophe Louis, MD as Consulting Physician (Obstetrics and Gynecology) Latanya Maudlin, MD as Consulting Physician (Orthopedic Surgery) Garlan Fair, MD as Consulting Physician (Gastroenterology) Barnett Abu., MD as Consulting Physician (Cardiology) Neldon Mc Donnamarie Poag, MD as Consulting Physician (Allergy and Immunology) Rexene Alberts, MD as Consulting Physician (Cardiothoracic Surgery) Melida Quitter, MD as Consulting Physician (Otolaryngology) Ngetich, Nelda Bucks, NP as Nurse Practitioner (Family Medicine)  Extended Emergency Contact Information Primary Emergency Contact: Kathie Rhodes of [place] Phone: [phone] Mobile Phone: [phone] Relation: Legal Guardian Secondary Emergency Contact: Leana Gamer [place] of [place] Phone: [phone] Relation: Uncle  Code Status:  DNR Goals of care: Advanced Directive information Advanced Directives 10/14/2019  Does Patient Have a Medical Advance Directive? Yes  Type of Paramedic of [place];Out of facility DNR (pink MOST or yellow form)  Does patient want to make changes to medical advance directive? No - Patient declined  Copy of [place] in Chart? Yes - validated most recent copy scanned in chart (See row information)  Pre-existing out of facility DNR order (yellow form or pink MOST form) Pink MOST form placed in chart (order not valid for inpatient use)     Chief Complaint  Patient presents with  . Medical Management of Chronic Issues  . Health Maintenance    Eye exam, urine microalbumin    HPI:  Pt is a 86 y.o. female seen today for medical management of chronic  diseases.    The patient resides in SNF [facility] for safety, care assistance, On Memantine 28mg  qd, Rivastigmine 9.5mg /24hr, ASA 81mg  qd  Her mood is stable, on Escitalopram 10mg  qd.   GERD, stable, on Omeprazole 20mg  qd.   Allergic rhinitis, takes Zyrtec 10mg  qd   Past Medical History:  Diagnosis Date  . Abnormal CT scan, chest September 2011   Scar tissue  . Allergic rhinitis   . Alzheimer's dementia ([place])   . Asthma   . Diabetes mellitus    borderline and under control-diet ,exercise  . Esophageal stricture   . Family history of colon cancer    Mother  . GERD (gastroesophageal reflux disease)   . Hiatal hernia   . HLD (hyperlipidemia)   . Hypertension   . Osteopenia   . Pneumonia 12-15 yrs.ago   yrs. ago  . TIA (transient ischemic attack)   . Vitamin D deficiency    Past Surgical History:  Procedure Laterality Date  . ABDOMINAL HYSTERECTOMY  1980   . BALLOON DILATION N/A 09/27/2018   Procedure: BALLOON DILATION;  Surgeon: Jackquline Denmark, MD;  [facility];  Service: Endoscopy;  Laterality: N/A;  . BIOPSY  09/27/2018   Procedure: BIOPSY;  Surgeon: Jackquline Denmark, MD;  [facility];  Service: Endoscopy;;  . CATARACT EXTRACTION Left 2014  . ESOPHAGOGASTRODUODENOSCOPY (EGD) WITH PROPOFOL N/A 09/27/2018   Procedure: ESOPHAGOGASTRODUODENOSCOPY (EGD) WITH PROPOFOL;  Surgeon: Jackquline Denmark, MD;  [facility];  Service: Endoscopy;  Laterality: N/A;  . SHOULDER OPEN ROTATOR CUFF REPAIR  05/22/2011   Procedure: ROTATOR CUFF REPAIR SHOULDER OPEN;  Surgeon: Tobi Bastos, MD;  [facility];  Service: Orthopedics;  Laterality: Left;  . THORACOTOMY / DECORTICATION PARIETAL PLEURA Right 204  empyema  . TONSILLECTOMY      Allergies  Allergen Reactions  . Aricept [Donepezil Hcl] Diarrhea  . Crestor [Rosuvastatin Calcium]     Myalgia  . Lipitor [Atorvastatin Calcium]   . Lovastatin     Myalgia  . Micardis [Telmisartan]     Fatigue  . Sertraline  Diarrhea  . Welchol [Colesevelam Hcl]     Constipation  . Zetia [Ezetimibe]     Myalgia  . Zocor [Simvastatin]     Myalgia    Allergies as of 11/15/2019      Reactions   Aricept [donepezil Hcl] Diarrhea   Crestor [rosuvastatin Calcium]    Myalgia   Lipitor [atorvastatin Calcium]    Lovastatin    Myalgia   Micardis [telmisartan]    Fatigue   Sertraline Diarrhea   Welchol [colesevelam Hcl]    Constipation   Zetia [ezetimibe]    Myalgia   Zocor [simvastatin]    Myalgia      Medication List       Accurate as of November 15, 2019  4:26 PM. If you have any questions, ask your nurse or doctor.        acetaminophen 325 MG tablet Commonly known as: TYLENOL Take 325 mg by mouth every 6 (six) hours as needed for headache. Take Two Tablets ( 650 ) as needed.   aspirin EC 81 MG tablet Take 81 mg by mouth daily.   cetirizine 10 MG tablet Commonly known as: ZYRTEC Take 10 mg by mouth daily.   chlorhexidine 0.12 % solution Commonly known as: PERIDEX Use as directed 5 mLs in the mouth or throat in the morning. Brush on 1 tsp. of solution to teeth and gums with a toothbrush after AM mouth care. Spit out excess and do not rinse.   escitalopram 10 MG tablet Commonly known as: LEXAPRO Take 1 tablet (10 mg total) by mouth daily.   Liquid Calcium/Vitamin D 600-200 MG-UNIT Caps Generic drug: Calcium Carbonate-Vitamin D Take 1 tablet by mouth.   memantine 28 MG Cp24 24 hr capsule Commonly known as: NAMENDA XR TAKE ONE CAPSULE ONCE A DAY TO PRESERVE MEMORY.   omeprazole 20 MG capsule Commonly known as: PRILOSEC Take 20 mg by mouth daily. Do not crush; Open capsule and sprinkle on applesauce   PreserVision AREDS 2 Chew Chew 1 tablet by mouth in the morning and at bedtime.   PreviDent 5000 Booster Plus 1.1 % Pste Generic drug: Sodium Fluoride Place 1 application onto teeth every evening.   rivastigmine 9.5 mg/24hr Commonly known as: EXELON Apply fresh patch daily and  remove old patch to help preserve memory   zinc oxide 20 % ointment Apply 1 application topically as needed for irritation. Apply to buttocks/peri topical as needed       Review of Systems  Constitutional: Negative for activity change, appetite change and fever.  HENT: Positive for hearing loss. Negative for congestion and voice change.   Eyes: Negative for visual disturbance.  Respiratory: Negative for cough, shortness of breath and wheezing.   Cardiovascular: Negative for leg swelling.  Gastrointestinal: Negative for abdominal pain and constipation.  Genitourinary: Negative for dysuria and urgency.  Musculoskeletal: Positive for gait problem.  Skin: Negative for color change.  Neurological: Negative for speech difficulty and light-headedness.       Dementia  Psychiatric/Behavioral: Positive for confusion. Negative for sleep disturbance. The patient is not nervous/anxious.     Immunization History  Administered Date(s) Administered  . Influenza, High Dose Seasonal PF  01/14/2019  . Influenza,inj,Quad PF,6+ Mos 12/29/2017  . Influenza-Unspecified 12/23/2013, 12/01/2014, 01/11/2016  . Moderna SARS-COVID-2 Vaccination 04/05/2019, 05/03/2019  . Pneumococcal Conjugate-13 11/22/2013  . Pneumococcal Polysaccharide-23 07/01/2006  . Tdap 12/17/2011  . Zoster 12/25/2005   Pertinent  Health Maintenance Due  Topic Date Due  . OPHTHALMOLOGY EXAM  Never done  . URINE MICROALBUMIN  02/11/2017  . INFLUENZA VACCINE  10/31/2019  . HEMOGLOBIN A1C  12/25/2019  . FOOT EXAM  04/28/2020  . DEXA SCAN  Completed  . PNA vac Low Risk Adult  Completed   Fall Risk  06/17/2018 04/22/2018 01/22/2017 07/23/2016 04/25/2015  Falls in the past year? - 0 No No No  Number falls in past yr: - 0 - - -  Injury with Fall? - 0 - - -  Risk for fall due to : Impaired vision - - - -   Functional Status Survey:    Vitals:   11/15/19 1544  BP: 135/73  Pulse: 72  Resp: 20  Temp: (!) 97.5 F (36.4 C)  SpO2:  95%  Weight: 146 lb 6.4 oz (66.4 kg)  Height: 5' (1.524 m)   Body mass index is 28.59 kg/m. Physical Exam Vitals and nursing note reviewed.  Constitutional:      Appearance: Normal appearance.  HENT:     Head: Normocephalic and atraumatic.     Jaw: No swelling or pain on movement.     Salivary Glands: Right salivary gland is not diffusely enlarged or tender. Left salivary gland is not diffusely enlarged or tender.     Mouth/Throat:     Mouth: Mucous membranes are moist.  Eyes:     Extraocular Movements: Extraocular movements intact.     Conjunctiva/sclera: Conjunctivae normal.     Pupils: Pupils are equal, round, and reactive to light.  Cardiovascular:     Rate and Rhythm: Normal rate and regular rhythm.     Heart sounds: No murmur heard.   Pulmonary:     Effort: Pulmonary effort is normal.     Breath sounds: No wheezing or rales.  Abdominal:     General: Bowel sounds are normal.     Palpations: Abdomen is soft.     Tenderness: There is no abdominal tenderness.  Musculoskeletal:     Cervical back: Normal range of motion and neck supple.     Right lower leg: No edema.     Left lower leg: No edema.  Skin:    General: Skin is warm and dry.  Neurological:     Mental Status: She is alert. Mental status is at baseline.     Gait: Gait abnormal.     Comments: Oriented to self.   Psychiatric:     Comments: Pleasantly confused, but able to follow simple directions     Labs reviewed: Recent Labs    01/08/19 0000 06/02/19 0000  NA 141 139  K 4.4 4.1  CL 104 103  CO2 28 28*  BUN 23* 20  CREATININE 0.7 0.6  CALCIUM 9.3 8.9   Recent Labs    01/08/19 0000 06/02/19 0000  AST 17 17  ALT 14 15  ALKPHOS 54 50  PROT 6.8  --   ALBUMIN 3.9 3.8   Recent Labs    01/08/19 0000 06/02/19 0000  WBC 7.2 9.9  NEUTROABS 3,542 5,495  HGB 12.4 12.7  HCT 38 37  PLT 298 247   Lab Results  Component Value Date   TSH 3.33 06/24/2019   Lab Results  Component Value Date    HGBA1C 5.9 06/24/2019   Lab Results  Component Value Date   CHOL 275 (H) 04/06/2018   HDL 52 04/06/2018   LDLCALC 190 (H) 04/06/2018   TRIG 161 (H) 04/06/2018   CHOLHDL 5.3 (H) 04/06/2018    Significant Diagnostic Results in last 30 days:  No results found.  Assessment/Plan Allergic rhinitis Stable, continue Zyrtec.   GERD (gastroesophageal reflux disease) Stable, continue Omeprazole.   Alzheimer's dementia No behavioral issues, continue SNF [facility] for safety, care assistance, continue Memantine, Rivastigmine for memory.   Depression, recurrent ([place]) Her mood is stable, continue Escitalopram.    Family/ staff Communication: plan of care reviewed with the patient and charge nurse.   Labs/tests ordered:  None  Time spend 25 minutes.

## 2019-11-15 NOTE — Assessment & Plan Note (Signed)
Her mood is stable, continue Escitalopram.  

## 2019-11-15 NOTE — Assessment & Plan Note (Signed)
Stable, continue Omeprazole.  

## 2019-11-15 NOTE — Assessment & Plan Note (Signed)
Stable, continue Zyrtec 

## 2019-11-19 ENCOUNTER — Non-Acute Institutional Stay (SKILLED_NURSING_FACILITY): Payer: Medicare PPO | Admitting: Nurse Practitioner

## 2019-11-19 ENCOUNTER — Encounter: Payer: Self-pay | Admitting: Nurse Practitioner

## 2019-11-19 DIAGNOSIS — R0989 Other specified symptoms and signs involving the circulatory and respiratory systems: Secondary | ICD-10-CM

## 2019-11-19 DIAGNOSIS — K219 Gastro-esophageal reflux disease without esophagitis: Secondary | ICD-10-CM

## 2019-11-19 DIAGNOSIS — F0150 Vascular dementia without behavioral disturbance: Secondary | ICD-10-CM | POA: Diagnosis not present

## 2019-11-19 DIAGNOSIS — J309 Allergic rhinitis, unspecified: Secondary | ICD-10-CM

## 2019-11-19 DIAGNOSIS — F339 Major depressive disorder, recurrent, unspecified: Secondary | ICD-10-CM | POA: Diagnosis not present

## 2019-11-19 NOTE — Progress Notes (Addendum)
Location:    [place] Room Number: 3 Place of Service:  SNF (31) Provider: Lennie Odor Kendrea Cerritos NP  [name], MD  Patient Care Team: [name], MD as PCP - General (Internal Medicine) Lindwood Coke, MD as Consulting Physician (Dermatology) Christophe Louis, MD as Consulting Physician (Obstetrics and Gynecology) Latanya Maudlin, MD as Consulting Physician (Orthopedic Surgery) Garlan Fair, MD as Consulting Physician (Gastroenterology) Barnett Abu., MD as Consulting Physician (Cardiology) Neldon Mc Donnamarie Poag, MD as Consulting Physician (Allergy and Immunology) Rexene Alberts, MD as Consulting Physician (Cardiothoracic Surgery) Melida Quitter, MD as Consulting Physician (Otolaryngology) Ngetich, Nelda Bucks, NP as Nurse Practitioner (Family Medicine)  Extended Emergency Contact Information Primary Emergency Contact: Kathie Rhodes of [place] Phone: [phone] Mobile Phone: [phone] Relation: Legal Guardian Secondary Emergency Contact: Leana Gamer [place] of [place] Phone: [phone] Relation: Uncle  Code Status:  DNR Goals of care: Advanced Directive information Advanced Directives 10/14/2019  Does Patient Have a Medical Advance Directive? Yes  Type of Paramedic of [place];Out of facility DNR (pink MOST or yellow form)  Does patient want to make changes to medical advance directive? No - Patient declined  Copy of [place] in Chart? Yes - validated most recent copy scanned in chart (See row information)  Pre-existing out of facility DNR order (yellow form or pink MOST form) Pink MOST form placed in chart (order not valid for inpatient use)     Chief Complaint  Patient presents with  . Acute Visit    Choking    HPI:  Pt is a 86 y.o. female seen today for an acute visit for choking episode at lunch, last a few minutes, no O2 desaturation, denied chest pain,  abd pain, nausea, vomiting, she is afebrile.    The patient resides in SNF [facility] for safety, care assistance, On Memantine 28mg  qd, Rivastigmine 9.5mg /24hr, ASA 81mg  qd             Her mood is stable, on Escitalopram 10mg  qd.              GERD, stable, on Omeprazole 20mg  qd.             Allergic rhinitis, takes Zyrtec 10mg  qd   Past Medical History:  Diagnosis Date  . Abnormal CT scan, chest September 2011   Scar tissue  . Allergic rhinitis   . Alzheimer's dementia ([place])   . Asthma   . Diabetes mellitus    borderline and under control-diet ,exercise  . Esophageal stricture   . Family history of colon cancer    Mother  . GERD (gastroesophageal reflux disease)   . Hiatal hernia   . HLD (hyperlipidemia)   . Hypertension   . Osteopenia   . Pneumonia 12-15 yrs.ago   yrs. ago  . TIA (transient ischemic attack)   . Vitamin D deficiency    Past Surgical History:  Procedure Laterality Date  . ABDOMINAL HYSTERECTOMY  1980   . BALLOON DILATION N/A 09/27/2018   Procedure: BALLOON DILATION;  Surgeon: Jackquline Denmark, MD;  [facility];  Service: Endoscopy;  Laterality: N/A;  . BIOPSY  09/27/2018   Procedure: BIOPSY;  Surgeon: Jackquline Denmark, MD;  [facility];  Service: Endoscopy;;  . CATARACT EXTRACTION Left 2014  . ESOPHAGOGASTRODUODENOSCOPY (EGD) WITH PROPOFOL N/A 09/27/2018   Procedure: ESOPHAGOGASTRODUODENOSCOPY (EGD) WITH PROPOFOL;  Surgeon: Jackquline Denmark, MD;  [facility];  Service: Endoscopy;  Laterality: N/A;  .  SHOULDER OPEN ROTATOR CUFF REPAIR  05/22/2011   Procedure: ROTATOR CUFF REPAIR SHOULDER OPEN;  Surgeon: Tobi Bastos, MD;  [facility];  Service: Orthopedics;  Laterality: Left;  . THORACOTOMY / DECORTICATION PARIETAL PLEURA Right 204   empyema  . TONSILLECTOMY      Allergies  Allergen Reactions  . Aricept [Donepezil Hcl] Diarrhea  . Crestor [Rosuvastatin Calcium]     Myalgia  . Lipitor [Atorvastatin Calcium]   . Lovastatin      Myalgia  . Micardis [Telmisartan]     Fatigue  . Sertraline Diarrhea  . Welchol [Colesevelam Hcl]     Constipation  . Zetia [Ezetimibe]     Myalgia  . Zocor [Simvastatin]     Myalgia    Allergies as of 11/19/2019      Reactions   Aricept [donepezil Hcl] Diarrhea   Crestor [rosuvastatin Calcium]    Myalgia   Lipitor [atorvastatin Calcium]    Lovastatin    Myalgia   Micardis [telmisartan]    Fatigue   Sertraline Diarrhea   Welchol [colesevelam Hcl]    Constipation   Zetia [ezetimibe]    Myalgia   Zocor [simvastatin]    Myalgia      Medication List       Accurate as of November 19, 2019 11:59 PM. If you have any questions, ask your nurse or doctor.        acetaminophen 325 MG tablet Commonly known as: TYLENOL Take 325 mg by mouth every 6 (six) hours as needed for headache. Take Two Tablets ( 650 ) as needed.   aspirin EC 81 MG tablet Take 81 mg by mouth daily.   cetirizine 10 MG tablet Commonly known as: ZYRTEC Take 10 mg by mouth daily.   chlorhexidine 0.12 % solution Commonly known as: PERIDEX Use as directed 5 mLs in the mouth or throat in the morning. Brush on 1 tsp. of solution to teeth and gums with a toothbrush after AM mouth care. Spit out excess and do not rinse.   escitalopram 10 MG tablet Commonly known as: LEXAPRO Take 1 tablet (10 mg total) by mouth daily.   Liquid Calcium/Vitamin D 600-200 MG-UNIT Caps Generic drug: Calcium Carbonate-Vitamin D Take 1 tablet by mouth.   memantine 28 MG Cp24 24 hr capsule Commonly known as: NAMENDA XR TAKE ONE CAPSULE ONCE A DAY TO PRESERVE MEMORY.   omeprazole 20 MG capsule Commonly known as: PRILOSEC Take 20 mg by mouth daily. Do not crush; Open capsule and sprinkle on applesauce   PreserVision AREDS 2 Chew Chew 1 tablet by mouth in the morning and at bedtime.   PreviDent 5000 Booster Plus 1.1 % Pste Generic drug: Sodium Fluoride Place 1 application onto teeth every evening.   rivastigmine 9.5  mg/24hr Commonly known as: EXELON Apply fresh patch daily and remove old patch to help preserve memory   zinc oxide 20 % ointment Apply 1 application topically as needed for irritation. Apply to buttocks/peri topical as needed       Review of Systems  Constitutional: Negative for appetite change, fatigue and fever.  HENT: Positive for hearing loss and trouble swallowing. Negative for congestion and voice change.   Eyes: Negative for visual disturbance.  Respiratory: Positive for cough and choking. Negative for chest tightness, shortness of breath and wheezing.   Cardiovascular: Negative for leg swelling.  Gastrointestinal: Negative for abdominal pain, constipation, nausea and vomiting.  Genitourinary: Negative for dysuria and urgency.  Musculoskeletal: Positive for gait problem.  Skin: Negative for color change.  Neurological: Negative for speech difficulty, light-headedness and headaches.       Dementia  Psychiatric/Behavioral: Positive for confusion. Negative for sleep disturbance. The patient is not nervous/anxious.     Immunization History  Administered Date(s) Administered  . Influenza, High Dose Seasonal PF 01/14/2019  . Influenza,inj,Quad PF,6+ Mos 12/29/2017  . Influenza-Unspecified 12/23/2013, 12/01/2014, 01/11/2016  . Moderna SARS-COVID-2 Vaccination 04/05/2019, 05/03/2019  . Pneumococcal Conjugate-13 11/22/2013  . Pneumococcal Polysaccharide-23 07/01/2006  . Tdap 12/17/2011  . Zoster 12/25/2005   Pertinent  Health Maintenance Due  Topic Date Due  . OPHTHALMOLOGY EXAM  Never done  . URINE MICROALBUMIN  02/11/2017  . INFLUENZA VACCINE  10/31/2019  . HEMOGLOBIN A1C  12/25/2019  . FOOT EXAM  04/28/2020  . DEXA SCAN  Completed  . PNA vac Low Risk Adult  Completed   Fall Risk  06/17/2018 04/22/2018 01/22/2017 07/23/2016 04/25/2015  Falls in the past year? - 0 No No No  Number falls in past yr: - 0 - - -  Injury with Fall? - 0 - - -  Risk for fall due to : Impaired  vision - - - -   Functional Status Survey:    Vitals:   11/19/19 1543  BP: 125/70  Pulse: 60  Resp: 20  Temp: 97.8 F (36.6 C)  SpO2: 94%  Weight: 146 lb 6.4 oz (66.4 kg)  Height: 5' (1.524 m)   Body mass index is 28.59 kg/m. Physical Exam Vitals and nursing note reviewed.  Constitutional:      Appearance: Normal appearance.  HENT:     Head: Normocephalic and atraumatic.     Jaw: No swelling or pain on movement.     Salivary Glands: Right salivary gland is not diffusely enlarged or tender. Left salivary gland is not diffusely enlarged or tender.     Mouth/Throat:     Mouth: Mucous membranes are moist.     Pharynx: No oropharyngeal exudate or posterior oropharyngeal erythema.  Eyes:     Extraocular Movements: Extraocular movements intact.     Conjunctiva/sclera: Conjunctivae normal.     Pupils: Pupils are equal, round, and reactive to light.  Cardiovascular:     Rate and Rhythm: Normal rate and regular rhythm.     Heart sounds: No murmur heard.   Pulmonary:     Effort: Pulmonary effort is normal.     Breath sounds: No wheezing, rhonchi or rales.  Chest:     Chest wall: No tenderness.  Abdominal:     General: Bowel sounds are normal. There is no distension.     Palpations: Abdomen is soft.     Tenderness: There is no abdominal tenderness. There is no right CVA tenderness, left CVA tenderness, guarding or rebound.  Musculoskeletal:     Cervical back: Normal range of motion and neck supple.     Right lower leg: No edema.     Left lower leg: No edema.  Skin:    General: Skin is warm and dry.  Neurological:     Mental Status: She is alert. Mental status is at baseline.     Gait: Gait abnormal.     Comments: Oriented to self.   Psychiatric:     Comments: Pleasantly confused, but able to follow simple directions     Labs reviewed: Recent Labs    01/08/19 0000 06/02/19 0000  NA 141 139  K 4.4 4.1  CL 104 103  CO2 28 28*  BUN 23* 20  CREATININE 0.7 0.6    CALCIUM 9.3 8.9   Recent Labs    01/08/19 0000 06/02/19 0000  AST 17 17  ALT 14 15  ALKPHOS 54 50  PROT 6.8  --   ALBUMIN 3.9 3.8   Recent Labs    01/08/19 0000 06/02/19 0000  WBC 7.2 9.9  NEUTROABS 3,542 5,495  HGB 12.4 12.7  HCT 38 37  PLT 298 247   Lab Results  Component Value Date   TSH 3.33 06/24/2019   Lab Results  Component Value Date   HGBA1C 5.9 06/24/2019   Lab Results  Component Value Date   CHOL 275 (H) 04/06/2018   HDL 52 04/06/2018   LDLCALC 190 (H) 04/06/2018   TRIG 161 (H) 04/06/2018   CHOLHDL 5.3 (H) 04/06/2018    Significant Diagnostic Results in last 30 days:  No results found.  Assessment/Plan Choking episode choking episode at lunch, last a few minutes, no O2 desaturation, denied chest pain, abd pain, nausea, vomiting, she is afebrile.  CXR ap, lateral, oblique views to r/o aspiration pneumonia, 11/20/19 CXR a small patchy infiltrate right lower lung-PNA, Levaquin 500mg  qd x 7days.  ST to eval. Tx. Hx of esophageal stricture, GERD, Dysphagia   Vascular dementia without behavioral disturbance ([place]) The patient resides in SNF [facility] for safety, care assistance, On Memantine 28mg  qd, Rivastigmine 9.5mg /24hr, ASA 81mg  qd   Depression, recurrent ([place]) Her mood is stable, continue Escitalopram  GERD (gastroesophageal reflux disease) Stable, continue Omeprazole.   Allergic rhinitis Stable, continue Zyrtec    Family/ staff Communication: plan of care reviewed with the patient and charge nurse.   Labs/tests ordered: CXR ap, lateral, oblique to r/o aspiration pneumonia  Time spend 25 minutes.

## 2019-11-19 NOTE — Assessment & Plan Note (Signed)
Her mood is stable, continue Escitalopram

## 2019-11-19 NOTE — Assessment & Plan Note (Signed)
Stable, continue Omeprazole.  

## 2019-11-19 NOTE — Assessment & Plan Note (Signed)
Stable, continue Zyrtec 

## 2019-11-19 NOTE — Assessment & Plan Note (Addendum)
choking episode at lunch, last a few minutes, no O2 desaturation, denied chest pain, abd pain, nausea, vomiting, she is afebrile.  CXR ap, lateral, oblique views to r/o aspiration pneumonia, 11/20/19 CXR a small patchy infiltrate right lower lung-PNA, Levaquin 500mg  qd x 7days.  ST to eval. Tx. Hx of esophageal stricture, GERD, Dysphagia

## 2019-11-19 NOTE — Assessment & Plan Note (Signed)
The patient resides in SNF [facility] for safety, care assistance, On Memantine 28mg  qd, Rivastigmine 9.5mg /24hr, ASA 81mg  qd

## 2019-12-31 ENCOUNTER — Non-Acute Institutional Stay (SKILLED_NURSING_FACILITY): Payer: Medicare PPO | Admitting: Nurse Practitioner

## 2019-12-31 ENCOUNTER — Encounter: Payer: Self-pay | Admitting: Nurse Practitioner

## 2019-12-31 DIAGNOSIS — F339 Major depressive disorder, recurrent, unspecified: Secondary | ICD-10-CM

## 2019-12-31 DIAGNOSIS — K219 Gastro-esophageal reflux disease without esophagitis: Secondary | ICD-10-CM

## 2019-12-31 DIAGNOSIS — W19XXXA Unspecified fall, initial encounter: Secondary | ICD-10-CM

## 2019-12-31 DIAGNOSIS — R1319 Other dysphagia: Secondary | ICD-10-CM

## 2019-12-31 DIAGNOSIS — F0150 Vascular dementia without behavioral disturbance: Secondary | ICD-10-CM

## 2019-12-31 DIAGNOSIS — R7303 Prediabetes: Secondary | ICD-10-CM

## 2019-12-31 DIAGNOSIS — R2681 Unsteadiness on feet: Secondary | ICD-10-CM

## 2019-12-31 DIAGNOSIS — J309 Allergic rhinitis, unspecified: Secondary | ICD-10-CM

## 2019-12-31 NOTE — Assessment & Plan Note (Signed)
Dysphagia, currently working with speech therapy, choking episodes in the past.

## 2019-12-31 NOTE — Progress Notes (Signed)
Location:   [place] Room Number: 3 Place of Service:  SNF [phone]) Provider:  Kacey Vicuna, Lennie Odor, NP  [name], MD  Patient Care Team: [name], MD as PCP - General (Internal Medicine) Lindwood Coke, MD as Consulting Physician (Dermatology) Christophe Louis, MD as Consulting Physician (Obstetrics and Gynecology) Latanya Maudlin, MD as Consulting Physician (Orthopedic Surgery) Garlan Fair, MD as Consulting Physician (Gastroenterology) Barnett Abu., MD as Consulting Physician (Cardiology) Neldon Mc Donnamarie Poag, MD as Consulting Physician (Allergy and Immunology) Rexene Alberts, MD as Consulting Physician (Cardiothoracic Surgery) Melida Quitter, MD as Consulting Physician (Otolaryngology) Ngetich, Nelda Bucks, NP as Nurse Practitioner (Family Medicine)  Extended Emergency Contact Information Primary Emergency Contact: Kathie Rhodes of [place] Phone: [phone] Mobile Phone: [phone] Relation: Legal Guardian Secondary Emergency Contact: Leana Gamer [place] of [place] Phone: [phone] Relation: Uncle  Code Status:  DNR Goals of care: Advanced Directive information Advanced Directives 12/31/2019  Does Patient Have a Medical Advance Directive? Yes  Type of Paramedic of [place];Living will;Out of facility DNR (pink MOST or yellow form)  Does patient want to make changes to medical advance directive? No - Patient declined  Copy of [place] in Chart? Yes - validated most recent copy scanned in chart (See row information)  Pre-existing out of facility DNR order (yellow form or pink MOST form) Pink MOST form placed in chart (order not valid for inpatient use)     Chief Complaint  Patient presents with  . Medical Management of Chronic Issues    Routine follow up visit.  Marland Kitchen Best Practice Recommendations    Fluvaccine  . Quality Metric Gaps    Urine microalbumin,  Hemoglobin A1C, Eye exam.    HPI:  Pt is a 86 y.o. female seen today for medical management of chronic diseases.    Dysphagia, currently working with speech therapy, choking episodes in the past.   Dementia, takes Memantine 28mg  qd, Rivastigmine 9.5mg /24hr, ASA 81mg  qd Her mood is stable, on Escitalopram 10mg  qd.  GERD, stable, on Omeprazole 20mg  qd. Allergic rhinitis, takes Zyrtec 10mg  qd  Frequent falls, contributory factors are  increased frailty and limited safety awareness, working with PT presently.   Prediabetes diet controlled, Hgb a1c 5.9 11/24/19. Normal urine micro albumin 11/24/19,  had eye exam 09/22/19      Past Medical History:  Diagnosis Date  . Abnormal CT scan, chest September 2011   Scar tissue  . Allergic rhinitis   . Alzheimer's dementia ([place])   . Asthma   . Diabetes mellitus    borderline and under control-diet ,exercise  . Esophageal stricture   . Family history of colon cancer    Mother  . GERD (gastroesophageal reflux disease)   . Hiatal hernia   . HLD (hyperlipidemia)   . Hypertension   . Osteopenia   . Pneumonia 12-15 yrs.ago   yrs. ago  . TIA (transient ischemic attack)   . Vitamin D deficiency    Past Surgical History:  Procedure Laterality Date  . ABDOMINAL HYSTERECTOMY  1980   . BALLOON DILATION N/A 09/27/2018   Procedure: BALLOON DILATION;  Surgeon: Jackquline Denmark, MD;  Location: [facility];  Service: Endoscopy;  Laterality: N/A;  . BIOPSY  09/27/2018   Procedure: BIOPSY;  Surgeon: Jackquline Denmark, MD;  Location: [facility];  Service: Endoscopy;;  . CATARACT EXTRACTION Left 2014  . ESOPHAGOGASTRODUODENOSCOPY (EGD) WITH PROPOFOL N/A 09/27/2018   Procedure: ESOPHAGOGASTRODUODENOSCOPY (EGD)  WITH PROPOFOL;  Surgeon: Jackquline Denmark, MD;  Location: [facility];  Service: Endoscopy;  Laterality: N/A;  . SHOULDER OPEN ROTATOR CUFF REPAIR  05/22/2011   Procedure: ROTATOR CUFF REPAIR SHOULDER OPEN;  Surgeon: Tobi Bastos, MD;  Location: [facility];  Service: Orthopedics;  Laterality: Left;  . THORACOTOMY / DECORTICATION PARIETAL PLEURA Right 204   empyema  . TONSILLECTOMY      Allergies  Allergen Reactions  . Aricept [Donepezil Hcl] Diarrhea  . Crestor [Rosuvastatin Calcium]     Myalgia  . Lipitor [Atorvastatin Calcium]   . Lovastatin     Myalgia  . Micardis [Telmisartan]     Fatigue  . Sertraline Diarrhea  . Welchol [Colesevelam Hcl]     Constipation  . Zetia [Ezetimibe]     Myalgia  . Zocor [Simvastatin]     Myalgia    Allergies as of 12/31/2019      Reactions   Aricept [donepezil Hcl] Diarrhea   Crestor [rosuvastatin Calcium]    Myalgia   Lipitor [atorvastatin Calcium]    Lovastatin    Myalgia   Micardis [telmisartan]    Fatigue   Sertraline Diarrhea   Welchol [colesevelam Hcl]    Constipation   Zetia [ezetimibe]    Myalgia   Zocor [simvastatin]    Myalgia      Medication List       Accurate as of December 31, 2019 11:59 PM. If you have any questions, ask your nurse or doctor.        acetaminophen 325 MG tablet Commonly known as: TYLENOL Take 325 mg by mouth every 6 (six) hours as needed for headache. Take Two Tablets ( 650 ) as needed.   aspirin EC 81 MG tablet Take 81 mg by mouth daily.   cetirizine 10 MG tablet Commonly known as: ZYRTEC Take 10 mg by mouth daily.   chlorhexidine 0.12 % solution Commonly known as: PERIDEX Use as directed 5 mLs in the mouth or throat in the morning. Brush on 1 tsp. of solution to teeth and gums with a toothbrush after AM mouth care. Spit out excess and do not rinse.   escitalopram 10 MG tablet Commonly known as: LEXAPRO Take 1 tablet (10 mg total) by mouth daily.   Liquid Calcium/Vitamin D 600-200 MG-UNIT Caps Generic drug: Calcium Carbonate-Vitamin D Take 1 tablet by mouth.   memantine 28 MG Cp24 24 hr capsule Commonly known as: NAMENDA XR TAKE ONE CAPSULE ONCE A DAY TO PRESERVE MEMORY.   omeprazole 20 MG  capsule Commonly known as: PRILOSEC Take 20 mg by mouth daily. Do not crush; Open capsule and sprinkle on applesauce   PreserVision AREDS 2 Chew Chew 1 tablet by mouth in the morning and at bedtime.   PreviDent 5000 Booster Plus 1.1 % Pste Generic drug: Sodium Fluoride Place 1 application onto teeth every evening.   rivastigmine 9.5 mg/24hr Commonly known as: EXELON Apply fresh patch daily and remove old patch to help preserve memory   zinc oxide 20 % ointment Apply 1 application topically as needed for irritation. Apply to buttocks/peri topical as needed       Review of Systems  Constitutional: Negative for fatigue, fever and unexpected weight change.  HENT: Positive for hearing loss and trouble swallowing. Negative for congestion and voice change.   Eyes: Negative for visual disturbance.  Respiratory: Negative for cough, shortness of breath and wheezing.   Cardiovascular: Negative for leg swelling.  Gastrointestinal: Negative for abdominal pain and constipation.  Genitourinary:  Negative for dysuria and urgency.  Musculoskeletal: Positive for gait problem.  Skin: Negative for color change.  Neurological: Negative for dizziness, speech difficulty and headaches.       Dementia  Psychiatric/Behavioral: Positive for confusion. Negative for behavioral problems and sleep disturbance. The patient is not nervous/anxious.     Immunization History  Administered Date(s) Administered  . Influenza, High Dose Seasonal PF 01/14/2019  . Influenza,inj,Quad PF,6+ Mos 12/29/2017  . Influenza-Unspecified 12/23/2013, 12/01/2014, 01/11/2016  . Moderna SARS-COVID-2 Vaccination 04/05/2019, 05/03/2019  . Pneumococcal Conjugate-13 11/22/2013  . Pneumococcal Polysaccharide-23 07/01/2006  . Tdap 12/17/2011  . Zoster 12/25/2005   Pertinent  Health Maintenance Due  Topic Date Due  . OPHTHALMOLOGY EXAM  Never done  . URINE MICROALBUMIN  02/11/2017  . INFLUENZA VACCINE  10/31/2019  . HEMOGLOBIN  A1C  12/25/2019  . FOOT EXAM  04/28/2020  . DEXA SCAN  Completed  . PNA vac Low Risk Adult  Completed   Fall Risk  06/17/2018 04/22/2018 01/22/2017 07/23/2016 04/25/2015  Falls in the past year? - 0 No No No  Number falls in past yr: - 0 - - -  Injury with Fall? - 0 - - -  Risk for fall due to : Impaired vision - - - -   Functional Status Survey:    Vitals:   12/31/19 1041  BP: 128/66  Pulse: 78  Resp: 18  Temp: (!) 96.2 F (35.7 C)  SpO2: 97%  Weight: 145 lb (65.8 kg)  Height: 5' (1.524 m)   Body mass index is 28.32 kg/m. Physical Exam Vitals and nursing note reviewed.  Constitutional:      Appearance: Normal appearance.  HENT:     Head: Normocephalic and atraumatic.     Jaw: No swelling or pain on movement.     Salivary Glands: Right salivary gland is not diffusely enlarged or tender. Left salivary gland is not diffusely enlarged or tender.     Mouth/Throat:     Mouth: Mucous membranes are moist.  Eyes:     Extraocular Movements: Extraocular movements intact.     Conjunctiva/sclera: Conjunctivae normal.     Pupils: Pupils are equal, round, and reactive to light.  Cardiovascular:     Rate and Rhythm: Normal rate and regular rhythm.     Heart sounds: No murmur heard.   Pulmonary:     Effort: Pulmonary effort is normal.     Breath sounds: No rales.  Chest:     Chest wall: No tenderness.  Abdominal:     General: Bowel sounds are normal.     Palpations: Abdomen is soft.     Tenderness: There is no abdominal tenderness. There is no rebound.  Musculoskeletal:     Cervical back: Normal range of motion and neck supple.     Right lower leg: No edema.     Left lower leg: No edema.  Skin:    General: Skin is warm and dry.  Neurological:     Mental Status: She is alert. Mental status is at baseline.     Gait: Gait abnormal.     Comments: Oriented to self.   Psychiatric:     Comments: Pleasantly confused, but able to follow simple directions     Labs  reviewed: Recent Labs    01/08/19 0000 06/02/19 0000  NA 141 139  K 4.4 4.1  CL 104 103  CO2 28 28*  BUN 23* 20  CREATININE 0.7 0.6  CALCIUM 9.3 8.9   Recent Labs  01/08/19 0000 06/02/19 0000  AST 17 17  ALT 14 15  ALKPHOS 54 50  PROT 6.8  --   ALBUMIN 3.9 3.8   Recent Labs    01/08/19 0000 06/02/19 0000  WBC 7.2 9.9  NEUTROABS 3,542 5,495  HGB 12.4 12.7  HCT 38 37  PLT 298 247   Lab Results  Component Value Date   TSH 3.33 06/24/2019   Lab Results  Component Value Date   HGBA1C 5.9 06/24/2019   Lab Results  Component Value Date   CHOL 275 (H) 04/06/2018   HDL 52 04/06/2018   LDLCALC 190 (H) 04/06/2018   TRIG 161 (H) 04/06/2018   CHOLHDL 5.3 (H) 04/06/2018    Significant Diagnostic Results in last 30 days:  No results found.  Assessment/Plan Prediabetes diet controlled, Hgb a1c 5.9 11/24/19. Normal urine micro albumin 11/24/19,  had eye exam 09/22/19    Depression, recurrent (HCC) Her mood is stable, on Escitalopram 10mg  qd.    Vascular dementia without behavioral disturbance (HCC) Continue supportive care in SNF [facility], continue Memantine 28mg  qd, Rivastigmine 9.5mg /24hr, ASA 81mg  qd   GERD (gastroesophageal reflux disease) Stable, continue Omeprazole.   Dysphagia Dysphagia, currently working with speech therapy, choking episodes in the past.   Allergic rhinitis Stable, continue Zyrtec.   Fall Frequent falls, contributory factors are  increased frailty and limited safety awareness, working with PT presently. Last fall was 2 days ago w/o apparent injury. Continue close supervision and assistance for safety.   Unsteady gait Furniture or rails walking sometimes.      Family/ staff Communication: plan of care reviewed with the patient and charge nurse.   Labs/tests ordered:  none  Time spend 35 minutes.

## 2019-12-31 NOTE — Assessment & Plan Note (Signed)
Furniture or rails walking sometimes.

## 2019-12-31 NOTE — Assessment & Plan Note (Signed)
Continue supportive care in SNF [facility], continue Memantine 28mg  qd, Rivastigmine 9.5mg /24hr, ASA 81mg  qd

## 2019-12-31 NOTE — Assessment & Plan Note (Signed)
Frequent falls, contributory factors are  increased frailty and limited safety awareness, working with PT presently. Last fall was 2 days ago w/o apparent injury. Continue close supervision and assistance for safety.

## 2019-12-31 NOTE — Assessment & Plan Note (Signed)
Stable, continue Omeprazole.  

## 2019-12-31 NOTE — Assessment & Plan Note (Signed)
diet controlled, Hgb a1c 5.9 11/24/19. Normal urine micro albumin 11/24/19, had eye exam 09/22/19 

## 2019-12-31 NOTE — Assessment & Plan Note (Signed)
Her mood is stable, on Escitalopram 10mg qd. 

## 2019-12-31 NOTE — Assessment & Plan Note (Signed)
Stable, continue Zyrtec 

## 2020-01-03 ENCOUNTER — Encounter: Payer: Self-pay | Admitting: Nurse Practitioner

## 2020-02-04 ENCOUNTER — Non-Acute Institutional Stay (SKILLED_NURSING_FACILITY): Payer: Medicare PPO | Admitting: Nurse Practitioner

## 2020-02-04 ENCOUNTER — Encounter: Payer: Self-pay | Admitting: Nurse Practitioner

## 2020-02-04 DIAGNOSIS — R7303 Prediabetes: Secondary | ICD-10-CM | POA: Diagnosis not present

## 2020-02-04 DIAGNOSIS — F0150 Vascular dementia without behavioral disturbance: Secondary | ICD-10-CM

## 2020-02-04 DIAGNOSIS — R635 Abnormal weight gain: Secondary | ICD-10-CM

## 2020-02-04 DIAGNOSIS — J309 Allergic rhinitis, unspecified: Secondary | ICD-10-CM

## 2020-02-04 DIAGNOSIS — K219 Gastro-esophageal reflux disease without esophagitis: Secondary | ICD-10-CM | POA: Diagnosis not present

## 2020-02-04 DIAGNOSIS — R0989 Other specified symptoms and signs involving the circulatory and respiratory systems: Secondary | ICD-10-CM

## 2020-02-04 DIAGNOSIS — F339 Major depressive disorder, recurrent, unspecified: Secondary | ICD-10-CM

## 2020-02-04 DIAGNOSIS — W19XXXA Unspecified fall, initial encounter: Secondary | ICD-10-CM

## 2020-02-04 NOTE — Assessment & Plan Note (Signed)
Allergic rhinitis, takes Zyrtec 10mg  qd

## 2020-02-04 NOTE — Assessment & Plan Note (Signed)
Frequent falls, contributory factors are increased frailty and limited safety awareness. 

## 2020-02-04 NOTE — Assessment & Plan Note (Addendum)
Dysphagia, choking episodes in the past and again today, cough, coughing up excessive mucous, will CXR ap/lateral if persists. Will have DuoNeb q8hrs x72hours.

## 2020-02-04 NOTE — Assessment & Plan Note (Signed)
Prediabetes diet controlled, Hgb a1c 5.9 11/24/19. Normal urine micro albumin 11/24/19,  had eye exam 09/22/19

## 2020-02-04 NOTE — Assessment & Plan Note (Signed)
Dementia, takes Memantine 28mg  qd, Rivastigmine 9.5mg /24hr, ASA 81mg  qd

## 2020-02-04 NOTE — Progress Notes (Signed)
Location:   SNF [place] Room Number: 3 Place of Service:  SNF (31) Provider: [facility] Christman NP  [name], MD  Patient Care Team: [name], MD as PCP - General (Internal Medicine) Lindwood Coke, MD as Consulting Physician (Dermatology) Christophe Louis, MD as Consulting Physician (Obstetrics and Gynecology) Latanya Maudlin, MD as Consulting Physician (Orthopedic Surgery) Garlan Fair, MD as Consulting Physician (Gastroenterology) Barnett Abu., MD as Consulting Physician (Cardiology) Neldon Mc Donnamarie Poag, MD as Consulting Physician (Allergy and Immunology) Rexene Alberts, MD as Consulting Physician (Cardiothoracic Surgery) Melida Quitter, MD as Consulting Physician (Otolaryngology) Ngetich, Nelda Bucks, NP as Nurse Practitioner (Family Medicine)  Extended Emergency Contact Information Primary Emergency Contact: Kathie Rhodes of [place] Phone: [phone] Mobile Phone: [phone] Relation: Legal Guardian Secondary Emergency Contact: Leana Gamer [place] of [place] Phone: [phone] Relation: Uncle  Code Status:  DNR Goals of care: Advanced Directive information Advanced Directives 02/04/2020  Does Patient Have a Medical Advance Directive? Yes  Type of Advance Directive Out of facility DNR (pink MOST or yellow form);[place];Living will  Does patient want to make changes to medical advance directive? No - Patient declined  Copy of [place] in Chart? Yes - validated most recent copy scanned in chart (See row information)  Pre-existing out of facility DNR order (yellow form or pink MOST form) Pink MOST form placed in chart (order not valid for inpatient use)     Chief Complaint  Patient presents with   Medical Management of Chronic Issues    Routine Visit.   Health Maintenance    Discuss the need for Eye Exam, Urine Microalbumin, and Hemoglobin A1C.    HPI:  Pt is a 86  y.o. female seen today for medical management of chronic diseases.    Dysphagia, choking episodes in the past.              Dementia, takes Memantine 28mg  qd, Rivastigmine 9.5mg /24hr, ASA 81mg  qd Her mood is stable, on Escitalopram 10mg  qd.  GERD, stable, on Omeprazole 20mg  qd. Allergic rhinitis, takes Zyrtec 10mg  qd             Frequent falls, contributory factors are  increased frailty and limited safety awareness.             Prediabetes diet controlled, Hgb a1c 5.9 11/24/19. Normal urine micro albumin 11/24/19,  had eye exam 09/22/19   Past Medical History:  Diagnosis Date   Abnormal CT scan, chest September 2011   Scar tissue   Allergic rhinitis    Alzheimer's dementia ([place])    Asthma    Diabetes mellitus    borderline and under control-diet ,exercise   Esophageal stricture    Family history of colon cancer    Mother   GERD (gastroesophageal reflux disease)    Hiatal hernia    HLD (hyperlipidemia)    Hypertension    Osteopenia    Pneumonia 12-15 yrs.ago   yrs. ago   TIA (transient ischemic attack)    Vitamin D deficiency    Past Surgical History:  Procedure Laterality Date   ABDOMINAL HYSTERECTOMY  1980    BALLOON DILATION N/A 09/27/2018   Procedure: BALLOON DILATION;  Surgeon: Jackquline Denmark, MD;  [facility];  Service: Endoscopy;  Laterality: N/A;   BIOPSY  09/27/2018   Procedure: BIOPSY;  Surgeon: Jackquline Denmark, MD;  [facility];  Service: Endoscopy;;   CATARACT EXTRACTION Left 2014  ESOPHAGOGASTRODUODENOSCOPY (EGD) WITH PROPOFOL N/A 09/27/2018   Procedure: ESOPHAGOGASTRODUODENOSCOPY (EGD) WITH PROPOFOL;  Surgeon: Jackquline Denmark, MD;  [facility];  Service: Endoscopy;  Laterality: N/A;   SHOULDER OPEN ROTATOR CUFF REPAIR  05/22/2011   Procedure: ROTATOR CUFF REPAIR SHOULDER OPEN;  Surgeon: Tobi Bastos, MD;  [facility];  Service: Orthopedics;  Laterality: Left;    THORACOTOMY / DECORTICATION PARIETAL PLEURA Right 204   empyema   TONSILLECTOMY      Allergies  Allergen Reactions   Aricept [Donepezil Hcl] Diarrhea   Crestor [Rosuvastatin Calcium]     Myalgia   Lipitor [Atorvastatin Calcium]    Lovastatin     Myalgia   Micardis [Telmisartan]     Fatigue   Sertraline Diarrhea   Welchol [Colesevelam Hcl]     Constipation   Zetia [Ezetimibe]     Myalgia   Zocor [Simvastatin]     Myalgia    Allergies as of 02/04/2020      Reactions   Aricept [donepezil Hcl] Diarrhea   Crestor [rosuvastatin Calcium]    Myalgia   Lipitor [atorvastatin Calcium]    Lovastatin    Myalgia   Micardis [telmisartan]    Fatigue   Sertraline Diarrhea   Welchol [colesevelam Hcl]    Constipation   Zetia [ezetimibe]    Myalgia   Zocor [simvastatin]    Myalgia      Medication List       Accurate as of February 04, 2020  4:38 PM. If you have any questions, ask your nurse or doctor.        acetaminophen 325 MG tablet Commonly known as: TYLENOL Take 325 mg by mouth every 6 (six) hours as needed for headache. Take Two Tablets ( 650 ) as needed.   aspirin EC 81 MG tablet Take 81 mg by mouth daily.   cetirizine 10 MG tablet Commonly known as: ZYRTEC Take 10 mg by mouth daily.   chlorhexidine 0.12 % solution Commonly known as: PERIDEX Use as directed 5 mLs in the mouth or throat in the morning. Brush on 1 tsp. of solution to teeth and gums with a toothbrush after AM mouth care. Spit out excess and do not rinse.   escitalopram 10 MG tablet Commonly known as: LEXAPRO Take 1 tablet (10 mg total) by mouth daily.   Liquid Calcium/Vitamin D 600-200 MG-UNIT Caps Generic drug: Calcium Carbonate-Vitamin D Take 1 tablet by mouth.   memantine 28 MG Cp24 24 hr capsule Commonly known as: NAMENDA XR TAKE ONE CAPSULE ONCE A DAY TO PRESERVE MEMORY.   omeprazole 20 MG capsule Commonly known as: PRILOSEC Take 20 mg by mouth daily. Do not crush; Open  capsule and sprinkle on applesauce   PreserVision AREDS 2 Chew Chew 1 tablet by mouth in the morning and at bedtime.   PreviDent 5000 Booster Plus 1.1 % Pste Generic drug: Sodium Fluoride Place 1 application onto teeth every evening.   rivastigmine 9.5 mg/24hr Commonly known as: EXELON Apply fresh patch daily and remove old patch to help preserve memory   zinc oxide 20 % ointment Apply 1 application topically as needed for irritation. Apply to buttocks/peri topical as needed       Review of Systems  Constitutional: Positive for unexpected weight change. Negative for activity change and fever.       #9Ibs weight gained in the past month.   HENT: Positive for hearing loss and trouble swallowing. Negative for congestion and voice change.   Eyes: Negative for  visual disturbance.  Respiratory: Positive for cough and choking. Negative for shortness of breath and wheezing.   Cardiovascular: Negative for leg swelling.  Gastrointestinal: Negative for abdominal pain and constipation.  Genitourinary: Negative for dysuria and urgency.  Musculoskeletal: Positive for gait problem.  Skin: Negative for color change.  Neurological: Negative for speech difficulty, light-headedness and headaches.       Dementia  Psychiatric/Behavioral: Positive for confusion. Negative for behavioral problems and sleep disturbance. The patient is not nervous/anxious.     Immunization History  Administered Date(s) Administered   Influenza, High Dose Seasonal PF 01/14/2019, 01/04/2020   Influenza,inj,Quad PF,6+ Mos 12/29/2017   Influenza-Unspecified 12/23/2013, 12/01/2014, 01/11/2016   Moderna SARS-COVID-2 Vaccination 04/05/2019, 05/03/2019   Pneumococcal Conjugate-13 11/22/2013   Pneumococcal Polysaccharide-23 07/01/2006   Tdap 12/17/2011   Zoster 12/25/2005   Pertinent  Health Maintenance Due  Topic Date Due   OPHTHALMOLOGY EXAM  Never done   URINE MICROALBUMIN  02/11/2017   HEMOGLOBIN A1C   12/25/2019   FOOT EXAM  04/28/2020   INFLUENZA VACCINE  Completed   DEXA SCAN  Completed   PNA vac Low Risk Adult  Completed   Fall Risk  06/17/2018 04/22/2018 01/22/2017 07/23/2016 04/25/2015  Falls in the past year? - 0 No No No  Number falls in past yr: - 0 - - -  Injury with Fall? - 0 - - -  Risk for fall due to : Impaired vision - - - -   Functional Status Survey:    Vitals:   02/04/20 1327  BP: 137/68  Pulse: 67  Resp: 20  Temp: (!) 96.9 F (36.1 C)  SpO2: 96%  Weight: 154 lb (69.9 kg)  Height: 5' (1.524 m)   Body mass index is 30.08 kg/m. Physical Exam Vitals and nursing note reviewed.  Constitutional:      Appearance: Normal appearance.  HENT:     Head: Normocephalic and atraumatic.     Jaw: No swelling or pain on movement.     Salivary Glands: Right salivary gland is not diffusely enlarged or tender. Left salivary gland is not diffusely enlarged or tender.     Nose: Nose normal.     Mouth/Throat:     Mouth: Mucous membranes are moist.  Eyes:     Extraocular Movements: Extraocular movements intact.     Conjunctiva/sclera: Conjunctivae normal.     Pupils: Pupils are equal, round, and reactive to light.  Cardiovascular:     Rate and Rhythm: Normal rate and regular rhythm.     Heart sounds: No murmur heard.   Pulmonary:     Effort: Pulmonary effort is normal.     Breath sounds: No rales.     Comments: Excessive mucous removed from her mouth Chest:     Chest wall: No tenderness.  Abdominal:     General: Bowel sounds are normal.     Palpations: Abdomen is soft.     Tenderness: There is no abdominal tenderness. There is no rebound.  Musculoskeletal:     Cervical back: Normal range of motion and neck supple.     Right lower leg: No edema.     Left lower leg: No edema.  Skin:    General: Skin is warm and dry.  Neurological:     Mental Status: She is alert. Mental status is at baseline.     Gait: Gait abnormal.     Comments: Oriented to self.     Psychiatric:     Comments: Pleasantly confused, but  able to follow simple directions     Labs reviewed: Recent Labs    06/02/19 0000  NA 139  K 4.1  CL 103  CO2 28*  BUN 20  CREATININE 0.6  CALCIUM 8.9   Recent Labs    06/02/19 0000  AST 17  ALT 15  ALKPHOS 50  ALBUMIN 3.8   Recent Labs    06/02/19 0000  WBC 9.9  NEUTROABS 5,495  HGB 12.7  HCT 37  PLT 247   Lab Results  Component Value Date   TSH 3.33 06/24/2019   Lab Results  Component Value Date   HGBA1C 5.9 06/24/2019   Lab Results  Component Value Date   CHOL 275 (H) 04/06/2018   HDL 52 04/06/2018   LDLCALC 190 (H) 04/06/2018   TRIG 161 (H) 04/06/2018   CHOLHDL 5.3 (H) 04/06/2018    Significant Diagnostic Results in last 30 days:  No results found.  Assessment/Plan Prediabetes Prediabetes diet controlled, Hgb a1c 5.9 11/24/19. Normal urine micro albumin 11/24/19,  had eye exam 09/22/19  Fall Frequent falls, contributory factors are  increased frailty and limited safety awareness.   Allergic rhinitis Allergic rhinitis, takes Zyrtec 10mg  qd   GERD (gastroesophageal reflux disease) GERD, stable, on Omeprazole 20mg  qd.  Depression, recurrent ([place]) Her mood is stable, on Escitalopram 10mg  qd.    Vascular dementia without behavioral disturbance (HCC) Dementia, takes Memantine 28mg  qd, Rivastigmine 9.5mg /24hr, ASA 81mg  qd   Choking episode Dysphagia, choking episodes in the past and again today, cough, coughing up excessive mucous, will CXR ap/lateral if persists. Will have DuoNeb q8hrs x72hours.    Weight gain #9 Ibs in the past month. Will weigh wkly, no apparent fluid retention. Observe.    Family/ staff Communication: plan of care reviewed with the patient and charge nurse.   Labs/tests ordered: CXR ap/lateral.   Time spend 35 minutes.

## 2020-02-04 NOTE — Assessment & Plan Note (Signed)
#  9 Ibs in the past month. Will weigh wkly, no apparent fluid retention. Observe.

## 2020-02-04 NOTE — Assessment & Plan Note (Signed)
GERD, stable, on Omeprazole 20mg qd.  

## 2020-02-04 NOTE — Assessment & Plan Note (Signed)
Her mood is stable, on Escitalopram 10mg qd. 

## 2020-02-04 NOTE — Progress Notes (Signed)
Location:   [facility] Room Number: 3 Place of Service:  SNF (31) Provider:  Mast, Man, NP    Patient Care Team: [name], MD as PCP - General (Internal Medicine) Lindwood Coke, MD as Consulting Physician (Dermatology) Christophe Louis, MD as Consulting Physician (Obstetrics and Gynecology) Latanya Maudlin, MD as Consulting Physician (Orthopedic Surgery) Garlan Fair, MD as Consulting Physician (Gastroenterology) Barnett Abu., MD as Consulting Physician (Cardiology) Neldon Mc Donnamarie Poag, MD as Consulting Physician (Allergy and Immunology) Rexene Alberts, MD as Consulting Physician (Cardiothoracic Surgery) Melida Quitter, MD as Consulting Physician (Otolaryngology) Ngetich, Nelda Bucks, NP as Nurse Practitioner (Family Medicine)  Extended Emergency Contact Information Primary Emergency Contact: Kathie Rhodes of [place] Phone: [phone] Mobile Phone: [phone] Relation: Legal Guardian Secondary Emergency Contact: Leana Gamer [place] of [place] Phone: [phone] Relation: Uncle  Code Status:  DNR Goals of care: Advanced Directive information Advanced Directives 02/04/2020  Does Patient Have a Medical Advance Directive? Yes  Type of Advance Directive Out of facility DNR (pink MOST or yellow form);[place];Living will  Does patient want to make changes to medical advance directive? No - Patient declined  Copy of [place] in Chart? Yes - validated most recent copy scanned in chart (See row information)  Pre-existing out of facility DNR order (yellow form or pink MOST form) Pink MOST form placed in chart (order not valid for inpatient use)     Chief Complaint  Patient presents with   Medical Management of Chronic Issues    Routine Visit.   Health Maintenance    Discuss the need for Eye Exam, Urine Microalbumin, and Hemoglobin A1C.    HPI:  Pt is a 86 y.o.  female seen today for medical management of chronic diseases.     Past Medical History:  Diagnosis Date   Abnormal CT scan, chest September 2011   Scar tissue   Allergic rhinitis    Alzheimer's dementia ([place])    Asthma    Diabetes mellitus    borderline and under control-diet ,exercise   Esophageal stricture    Family history of colon cancer    Mother   GERD (gastroesophageal reflux disease)    Hiatal hernia    HLD (hyperlipidemia)    Hypertension    Osteopenia    Pneumonia 12-15 yrs.ago   yrs. ago   TIA (transient ischemic attack)    Vitamin D deficiency    Past Surgical History:  Procedure Laterality Date   ABDOMINAL HYSTERECTOMY  1980    BALLOON DILATION N/A 09/27/2018   Procedure: BALLOON DILATION;  Surgeon: Jackquline Denmark, MD;  [facility];  Service: Endoscopy;  Laterality: N/A;   BIOPSY  09/27/2018   Procedure: BIOPSY;  Surgeon: Jackquline Denmark, MD;  [facility];  Service: Endoscopy;;   CATARACT EXTRACTION Left 2014   ESOPHAGOGASTRODUODENOSCOPY (EGD) WITH PROPOFOL N/A 09/27/2018   Procedure: ESOPHAGOGASTRODUODENOSCOPY (EGD) WITH PROPOFOL;  Surgeon: Jackquline Denmark, MD;  [facility];  Service: Endoscopy;  Laterality: N/A;   SHOULDER OPEN ROTATOR CUFF REPAIR  05/22/2011   Procedure: ROTATOR CUFF REPAIR SHOULDER OPEN;  Surgeon: Tobi Bastos, MD;  [facility]: [facility];  Service: Orthopedics;  Laterality: Left;   THORACOTOMY / DECORTICATION PARIETAL PLEURA Right 204   empyema   TONSILLECTOMY      Allergies  Allergen Reactions   Aricept [Donepezil Hcl] Diarrhea   Crestor [Rosuvastatin Calcium]     Myalgia   Lipitor [employer]  Calcium]    Lovastatin     Myalgia   Micardis [Telmisartan]     Fatigue   Sertraline Diarrhea   Welchol [Colesevelam Hcl]     Constipation   Zetia [Ezetimibe]     Myalgia   Zocor [Simvastatin]     Myalgia    Allergies as of 02/04/2020      Reactions   Aricept [donepezil Hcl]  Diarrhea   Crestor [rosuvastatin Calcium]    Myalgia   Lipitor [atorvastatin Calcium]    Lovastatin    Myalgia   Micardis [telmisartan]    Fatigue   Sertraline Diarrhea   Welchol [colesevelam Hcl]    Constipation   Zetia [ezetimibe]    Myalgia   Zocor [simvastatin]    Myalgia      Medication List       Accurate as of February 04, 2020  1:37 PM. If you have any questions, ask your nurse or doctor.        acetaminophen 325 MG tablet Commonly known as: TYLENOL Take 325 mg by mouth every 6 (six) hours as needed for headache. Take Two Tablets ( 650 ) as needed.   aspirin EC 81 MG tablet Take 81 mg by mouth daily.   cetirizine 10 MG tablet Commonly known as: ZYRTEC Take 10 mg by mouth daily.   chlorhexidine 0.12 % solution Commonly known as: PERIDEX Use as directed 5 mLs in the mouth or throat in the morning. Brush on 1 tsp. of solution to teeth and gums with a toothbrush after AM mouth care. Spit out excess and do not rinse.   escitalopram 10 MG tablet Commonly known as: LEXAPRO Take 1 tablet (10 mg total) by mouth daily.   Liquid Calcium/Vitamin D 600-200 MG-UNIT Caps Generic drug: Calcium Carbonate-Vitamin D Take 1 tablet by mouth.   memantine 28 MG Cp24 24 hr capsule Commonly known as: NAMENDA XR TAKE ONE CAPSULE ONCE A DAY TO PRESERVE MEMORY.   omeprazole 20 MG capsule Commonly known as: PRILOSEC Take 20 mg by mouth daily. Do not crush; Open capsule and sprinkle on applesauce   PreserVision AREDS 2 Chew Chew 1 tablet by mouth in the morning and at bedtime.   PreviDent 5000 Booster Plus 1.1 % Pste Generic drug: Sodium Fluoride Place 1 application onto teeth every evening.   rivastigmine 9.5 mg/24hr Commonly known as: EXELON Apply fresh patch daily and remove old patch to help preserve memory   zinc oxide 20 % ointment Apply 1 application topically as needed for irritation. Apply to buttocks/peri topical as needed       Review of  Systems  Immunization History  Administered Date(s) Administered   Influenza, High Dose Seasonal PF 01/14/2019, 01/04/2020   Influenza,inj,Quad PF,6+ Mos 12/29/2017   Influenza-Unspecified 12/23/2013, 12/01/2014, 01/11/2016   Moderna SARS-COVID-2 Vaccination 04/05/2019, 05/03/2019   Pneumococcal Conjugate-13 11/22/2013   Pneumococcal Polysaccharide-23 07/01/2006   Tdap 12/17/2011   Zoster 12/25/2005   Pertinent  Health Maintenance Due  Topic Date Due   OPHTHALMOLOGY EXAM  Never done   URINE MICROALBUMIN  02/11/2017   HEMOGLOBIN A1C  12/25/2019   FOOT EXAM  04/28/2020   INFLUENZA VACCINE  Completed   DEXA SCAN  Completed   PNA vac Low Risk Adult  Completed   Fall Risk  06/17/2018 04/22/2018 01/22/2017 07/23/2016 04/25/2015  Falls in the past year? - 0 No No No  Number falls in past yr: - 0 - - -  Injury with Fall? - 0 - - -  Risk for fall due to : Impaired vision - - - -   Functional Status Survey:    Vitals:   02/04/20 1327  BP: 137/68  Pulse: 67  Resp: 20  Temp: (!) 96.9 F (36.1 C)  SpO2: 96%  Weight: 154 lb (69.9 kg)  Height: 5' (1.524 m)   Body mass index is 30.08 kg/m. Physical Exam  Labs reviewed: Recent Labs    06/02/19 0000  NA 139  K 4.1  CL 103  CO2 28*  BUN 20  CREATININE 0.6  CALCIUM 8.9   Recent Labs    06/02/19 0000  AST 17  ALT 15  ALKPHOS 50  ALBUMIN 3.8   Recent Labs    06/02/19 0000  WBC 9.9  NEUTROABS 5,495  HGB 12.7  HCT 37  PLT 247   Lab Results  Component Value Date   TSH 3.33 06/24/2019   Lab Results  Component Value Date   HGBA1C 5.9 06/24/2019   Lab Results  Component Value Date   CHOL 275 (H) 04/06/2018   HDL 52 04/06/2018   LDLCALC 190 (H) 04/06/2018   TRIG 161 (H) 04/06/2018   CHOLHDL 5.3 (H) 04/06/2018    Significant Diagnostic Results in last 30 days:  No results found.  Assessment/Plan 1. Prediabetes   2. Fall, initial encounter   3. Allergic rhinitis, unspecified  seasonality, unspecified trigger   4. Gastroesophageal reflux disease, unspecified whether esophagitis present   5. Depression, recurrent ([place])   6. Vascular dementia without behavioral disturbance ([place])   7. Choking episode     Family/ staff Communication:   Labs/tests ordered:

## 2020-02-07 ENCOUNTER — Non-Acute Institutional Stay (SKILLED_NURSING_FACILITY): Payer: Medicare PPO | Admitting: Nurse Practitioner

## 2020-02-07 DIAGNOSIS — R531 Weakness: Secondary | ICD-10-CM

## 2020-02-07 DIAGNOSIS — F0280 Dementia in other diseases classified elsewhere without behavioral disturbance: Secondary | ICD-10-CM

## 2020-02-07 DIAGNOSIS — R1319 Other dysphagia: Secondary | ICD-10-CM

## 2020-02-07 DIAGNOSIS — R7303 Prediabetes: Secondary | ICD-10-CM

## 2020-02-07 DIAGNOSIS — F339 Major depressive disorder, recurrent, unspecified: Secondary | ICD-10-CM

## 2020-02-07 DIAGNOSIS — W19XXXA Unspecified fall, initial encounter: Secondary | ICD-10-CM

## 2020-02-07 DIAGNOSIS — J690 Pneumonitis due to inhalation of food and vomit: Secondary | ICD-10-CM | POA: Diagnosis not present

## 2020-02-07 DIAGNOSIS — K219 Gastro-esophageal reflux disease without esophagitis: Secondary | ICD-10-CM

## 2020-02-07 DIAGNOSIS — J309 Allergic rhinitis, unspecified: Secondary | ICD-10-CM

## 2020-02-07 DIAGNOSIS — G301 Alzheimer's disease with late onset: Secondary | ICD-10-CM

## 2020-02-07 NOTE — Assessment & Plan Note (Signed)
Frequent falls, contributory factors are increased frailty and limited safety awareness. 

## 2020-02-07 NOTE — Assessment & Plan Note (Signed)
Prediabetes diet controlled, Hgb a1c 5.9 11/24/19. Normal urine micro albumin 11/24/19, had eye exam 09/22/19

## 2020-02-07 NOTE — Assessment & Plan Note (Addendum)
Allergic rhinitis, takes Zyrtec 10mg  qd

## 2020-02-07 NOTE — Assessment & Plan Note (Signed)
Clinically presumed in setting of generalized weakness, persisted cough since choking episode 02/04/20, more lung crackles appreciated right mid and bibasilar today. CXR 02/05/20 showed no active appearing pathology. She is feverish but no fever, appetite was reportedly poor. Will complete 72 hours DuoNeb today.   Will treat with Doxycycline 100mg  bid x 7 days along with FloraStor bid. Observe.

## 2020-02-07 NOTE — Assessment & Plan Note (Signed)
Her mood is stable, on Escitalopram 10mg qd. 

## 2020-02-07 NOTE — Assessment & Plan Note (Addendum)
Dysphagia, choking episodes in the past. Speech therapy to eval

## 2020-02-07 NOTE — Assessment & Plan Note (Signed)
GERD, stable, on Omeprazole 20mg qd.  

## 2020-02-07 NOTE — Assessment & Plan Note (Signed)
Related to acute onset of aspiration, will update CBC/diff, CMP/eGFR, supportive care.

## 2020-02-07 NOTE — Progress Notes (Signed)
Location:    [facility] Room Number: 3 Place of Service:  SNF (31) Provider: Lennie Odor Makhiya Coburn NP  [name], MD  Patient Care Team: [name], MD as PCP - General (Internal Medicine) Lindwood Coke, MD as Consulting Physician (Dermatology) Christophe Louis, MD as Consulting Physician (Obstetrics and Gynecology) Latanya Maudlin, MD as Consulting Physician (Orthopedic Surgery) Garlan Fair, MD as Consulting Physician (Gastroenterology) Barnett Abu., MD as Consulting Physician (Cardiology) Neldon Mc Donnamarie Poag, MD as Consulting Physician (Allergy and Immunology) Rexene Alberts, MD as Consulting Physician (Cardiothoracic Surgery) Melida Quitter, MD as Consulting Physician (Otolaryngology) Ngetich, Nelda Bucks, NP as Nurse Practitioner (Family Medicine)  Extended Emergency Contact Information Primary Emergency Contact: Kathie Rhodes of [place] Phone: [phone] Mobile Phone: [phone] Relation: Legal Guardian Secondary Emergency Contact: Leana Gamer [place] of [place] Phone: [phone] Relation: Uncle  Code Status:  DNR Goals of care: Advanced Directive information Advanced Directives 02/04/2020  Does Patient Have a Medical Advance Directive? Yes  Type of Advance Directive Out of facility DNR (pink MOST or yellow form);[place];Living will  Does patient want to make changes to medical advance directive? No - Patient declined  Copy of [place] in Chart? Yes - validated most recent copy scanned in chart (See row information)  Pre-existing out of facility DNR order (yellow form or pink MOST form) Pink MOST form placed in chart (order not valid for inpatient use)     Chief Complaint  Patient presents with  . Acute Visit    Generalized weakness    HPI:  Pt is a 86 y.o. female seen today for an acute visit for generalized weakness, persisted cough since choking episode  02/04/20, more lung crackles appreciated right mid and bibasilar today. CXR 02/05/20 showed no active appearing pathology. She is feverish but no fever, appetite was reportedly poor. Will complete 72 hours DuoNeb today.   Dysphagia, choking episodes in the past.  Dementia, takes Memantine 42m qd, Rivastigmine 9.579m24hr, ASA 8178md Her mood is stable, on Escitalopram 76m22m.  GERD, stable, on Omeprazole 20mg39m Allergic rhinitis, takes Zyrtec 76mg 69mrequent falls, contributory factors are increased frailty and limited safety awareness. Prediabetes diet controlled, Hgb a1c 5.9 11/24/19. Normal urine micro albumin 11/24/19, had eye exam 09/22/19          Past Medical History:  Diagnosis Date  . Abnormal CT scan, chest September 2011   Scar tissue  . Allergic rhinitis   . Alzheimer's dementia ([facility])  [place]   . Diabetes mellitus    borderline and under control-diet ,exercise  . Esophageal stricture   . Family history of colon cancer    Mother  . GERD (gastroesophageal reflux disease)   . Hiatal hernia   . HLD (hyperlipidemia)   . Hypertension   . Osteopenia   . Pneumonia 12-15 yrs.ago   yrs. ago  . TIA (transient ischemic attack)   . Vitamin D deficiency    Past Surgical History:  Procedure Laterality Date  . ABDOMINAL HYSTERECTOMY  1980   . BALLOON DILATION N/A 09/27/2018   Procedure: BALLOON DILATION;  Surgeon: Gupta,Jackquline Denmark [facility];  Service: Endoscopy;  Laterality: N/A;  . BIOPSY  09/27/2018   Procedure: BIOPSY;  Surgeon: Gupta,Jackquline Denmark [facility]: [facility];  Service: Endoscopy;;  . CATARACT EXTRACTION Left 2014  . ESOPHAGOGASTRODUODENOSCOPY (EGD) WITH PROPOFOL N/A 09/27/2018   Procedure: ESOPHAGOGASTRODUODENOSCOPY (EGD) WITH  PROPOFOL;  Surgeon: Jackquline Denmark, MD;  Location: [facility];  Service: Endoscopy;  Laterality: N/A;  . SHOULDER OPEN ROTATOR CUFF REPAIR   05/22/2011   Procedure: ROTATOR CUFF REPAIR SHOULDER OPEN;  Surgeon: Tobi Bastos, MD;  [facility]: [facility];  Service: Orthopedics;  Laterality: Left;  . THORACOTOMY / DECORTICATION PARIETAL PLEURA Right 204   empyema  . TONSILLECTOMY      Allergies  Allergen Reactions  . Aricept [Donepezil Hcl] Diarrhea  . Crestor [Rosuvastatin Calcium]     Myalgia  . Lipitor [Atorvastatin Calcium]   . Lovastatin     Myalgia  . Micardis [Telmisartan]     Fatigue  . Sertraline Diarrhea  . Welchol [Colesevelam Hcl]     Constipation  . Zetia [Ezetimibe]     Myalgia  . Zocor [Simvastatin]     Myalgia    Allergies as of 02/07/2020      Reactions   Aricept [donepezil Hcl] Diarrhea   Crestor [rosuvastatin Calcium]    Myalgia   Lipitor [atorvastatin Calcium]    Lovastatin    Myalgia   Micardis [telmisartan]    Fatigue   Sertraline Diarrhea   Welchol [colesevelam Hcl]    Constipation   Zetia [ezetimibe]    Myalgia   Zocor [simvastatin]    Myalgia      Medication List       Accurate as of February 07, 2020 11:59 PM. If you have any questions, ask your nurse or doctor.        acetaminophen 325 MG tablet Commonly known as: TYLENOL Take 325 mg by mouth every 6 (six) hours as needed for headache. Take Two Tablets ( 650 ) as needed.   aspirin EC 81 MG tablet Take 81 mg by mouth daily.   cetirizine 10 MG tablet Commonly known as: ZYRTEC Take 10 mg by mouth daily.   chlorhexidine 0.12 % solution Commonly known as: PERIDEX Use as directed 5 mLs in the mouth or throat in the morning. Brush on 1 tsp. of solution to teeth and gums with a toothbrush after AM mouth care. Spit out excess and do not rinse.   escitalopram 10 MG tablet Commonly known as: LEXAPRO Take 1 tablet (10 mg total) by mouth daily.   ipratropium-albuterol 0.5-2.5 (3) MG/3ML Soln Commonly known as: DUONEB Take 3 mLs by nebulization every 8 (eight) hours.   Liquid Calcium/Vitamin D 600-200 MG-UNIT Caps Generic  drug: Calcium Carbonate-Vitamin D Take 1 tablet by mouth.   memantine 28 MG Cp24 24 hr capsule Commonly known as: NAMENDA XR TAKE ONE CAPSULE ONCE A DAY TO PRESERVE MEMORY.   omeprazole 20 MG capsule Commonly known as: PRILOSEC Take 20 mg by mouth daily. Do not crush; Open capsule and sprinkle on applesauce   PreserVision AREDS 2 Chew Chew 1 tablet by mouth in the morning and at bedtime.   PreviDent 5000 Booster Plus 1.1 % Pste Generic drug: Sodium Fluoride Place 1 application onto teeth every evening.   rivastigmine 9.5 mg/24hr Commonly known as: EXELON Apply fresh patch daily and remove old patch to help preserve memory   zinc oxide 20 % ointment Apply 1 application topically as needed for irritation. Apply to buttocks/peri topical as needed       Review of Systems  Constitutional: Positive for activity change, appetite change and fatigue. Negative for fever.       #9Ibs weight gained in the past month.   HENT: Positive for hearing loss and trouble swallowing. Negative for congestion and  voice change.   Eyes: Negative for visual disturbance.  Respiratory: Positive for cough and choking. Negative for shortness of breath and wheezing.   Cardiovascular: Negative for leg swelling.  Gastrointestinal: Negative for abdominal pain and constipation.  Genitourinary: Negative for dysuria and urgency.  Musculoskeletal: Positive for gait problem.  Skin: Negative for color change.  Neurological: Negative for speech difficulty, light-headedness and headaches.       Dementia  Psychiatric/Behavioral: Positive for confusion. Negative for behavioral problems and sleep disturbance. The patient is not nervous/anxious.     Immunization History  Administered Date(s) Administered  . Influenza, High Dose Seasonal PF 01/14/2019, 01/04/2020  . Influenza,inj,Quad PF,6+ Mos 12/29/2017  . Influenza-Unspecified 12/23/2013, 12/01/2014, 01/11/2016  . Moderna SARS-COVID-2 Vaccination 04/05/2019,  05/03/2019  . Pneumococcal Conjugate-13 11/22/2013  . Pneumococcal Polysaccharide-23 07/01/2006  . Tdap 12/17/2011  . Zoster 12/25/2005   Pertinent  Health Maintenance Due  Topic Date Due  . OPHTHALMOLOGY EXAM  Never done  . URINE MICROALBUMIN  02/11/2017  . HEMOGLOBIN A1C  12/25/2019  . FOOT EXAM  04/28/2020  . INFLUENZA VACCINE  Completed  . DEXA SCAN  Completed  . PNA vac Low Risk Adult  Completed   Fall Risk  06/17/2018 04/22/2018 01/22/2017 07/23/2016 04/25/2015  Falls in the past year? - 0 No No No  Number falls in past yr: - 0 - - -  Injury with Fall? - 0 - - -  Risk for fall due to : Impaired vision - - - -   Functional Status Survey:    Vitals:   02/07/20 1008  BP: 137/68  Pulse: 62  Resp: 20  Temp: (!) 97.1 F (36.2 C)  SpO2: 94%  Weight: 145 lb (65.8 kg)  Height: 5' (1.524 m)   Body mass index is 28.32 kg/m. Physical Exam Vitals and nursing note reviewed.  Constitutional:      Comments: Tired appearance.   HENT:     Head: Normocephalic and atraumatic.     Jaw: No swelling or pain on movement.     Salivary Glands: Right salivary gland is not diffusely enlarged or tender. Left salivary gland is not diffusely enlarged or tender.     Mouth/Throat:     Mouth: Mucous membranes are dry.  Eyes:     Extraocular Movements: Extraocular movements intact.     Conjunctiva/sclera: Conjunctivae normal.     Pupils: Pupils are equal, round, and reactive to light.  Cardiovascular:     Rate and Rhythm: Normal rate and regular rhythm.     Heart sounds: No murmur heard.   Pulmonary:     Effort: Pulmonary effort is normal.     Breath sounds: Rales present.     Comments: Rales right mid to lower lungs, also posterior left lung base.  Chest:     Chest wall: No tenderness.  Abdominal:     General: Bowel sounds are normal.     Palpations: Abdomen is soft.     Tenderness: There is no abdominal tenderness. There is no rebound.  Musculoskeletal:     Cervical back: Normal  range of motion and neck supple.     Right lower leg: No edema.     Left lower leg: No edema.  Skin:    General: Skin is warm and dry.  Neurological:     Mental Status: She is alert. Mental status is at baseline.     Gait: Gait abnormal.     Comments: Oriented to self.   Psychiatric:  Comments: Pleasantly confused, but able to follow simple directions     Labs reviewed: Recent Labs    06/02/19 0000  NA 139  K 4.1  CL 103  CO2 28*  BUN 20  CREATININE 0.6  CALCIUM 8.9   Recent Labs    06/02/19 0000  AST 17  ALT 15  ALKPHOS 50  ALBUMIN 3.8   Recent Labs    06/02/19 0000  WBC 9.9  NEUTROABS 5,495  HGB 12.7  HCT 37  PLT 247   Lab Results  Component Value Date   TSH 3.33 06/24/2019   Lab Results  Component Value Date   HGBA1C 5.9 06/24/2019   Lab Results  Component Value Date   CHOL 275 (H) 04/06/2018   HDL 52 04/06/2018   LDLCALC 190 (H) 04/06/2018   TRIG 161 (H) 04/06/2018   CHOLHDL 5.3 (H) 04/06/2018    Significant Diagnostic Results in last 30 days:  No results found.  Assessment/Plan Aspiration pneumonia ([place]) Clinically presumed in setting of generalized weakness, persisted cough since choking episode 02/04/20, more lung crackles appreciated right mid and bibasilar today. CXR 02/05/20 showed no active appearing pathology. She is feverish but no fever, appetite was reportedly poor. Will complete 72 hours DuoNeb today.   Will treat with Doxycycline 131m bid x 7 days along with FloraStor bid. Observe.   Generalized weakness Related to acute onset of aspiration, will update CBC/diff, CMP/eGFR, supportive care.   Dysphagia Dysphagia, choking episodes in the past. Speech therapy to eval  Alzheimer's dementia Dementia, takes Memantine 296mqd, Rivastigmine 9.1m46m4hr, ASA 68m11m   Depression, recurrent (H[facility]) Her mood is stable, on Escitalopram 10mg42m   GERD (gastroesophageal reflux disease) GERD, stable, on Omeprazole 20mg 57m  Allergic rhinitis Allergic rhinitis, takes Zyrtec 10mg q62mFall Frequent falls, contributory factors are increased frailty and limited safety awareness.  Prediabetes Prediabetes diet controlled, Hgb a1c 5.9 11/24/19. Normal urine micro albumin 11/24/19, had eye exam 09/22/19           Family/ staff Communication: plan of care reviewed with the patient and charge nurse.   Labs/tests ordered: CBC/diff, CMP/eGFR  Time spend 35 minutes.

## 2020-02-07 NOTE — Assessment & Plan Note (Signed)
Dementia, takes Memantine 28mg  qd, Rivastigmine 9.5mg /24hr, ASA 81mg  qd

## 2020-02-08 ENCOUNTER — Encounter: Payer: Self-pay | Admitting: Nurse Practitioner

## 2020-02-08 LAB — COMPREHENSIVE METABOLIC PANEL
Albumin: 3.8 (ref 3.5–5.0)
Calcium: 9 (ref 8.7–10.7)
GFR calc Af Amer: 33
GFR calc non Af Amer: 56
Globulin: 2.5

## 2020-02-08 LAB — BASIC METABOLIC PANEL
BUN: 31 — AB (ref 4–21)
CO2: 27 — AB (ref 13–22)
Chloride: 104 (ref 99–108)
Creatinine: 0.9 (ref 0.5–1.1)
Glucose: 143
Potassium: 3.5 (ref 3.4–5.3)
Sodium: 139 (ref 137–147)

## 2020-02-08 LAB — CBC AND DIFFERENTIAL
HCT: 31 — AB (ref 36–46)
Hemoglobin: 10.4 — AB (ref 12.0–16.0)
Neutrophils Absolute: 17512
Platelets: 231 (ref 150–399)
WBC: 22

## 2020-02-08 LAB — HEPATIC FUNCTION PANEL
ALT: 10 (ref 7–35)
AST: 13 (ref 13–35)
Alkaline Phosphatase: 61 (ref 25–125)
Bilirubin, Total: 1.4

## 2020-02-08 LAB — CBC: RBC: 3.5 — AB (ref 3.87–5.11)

## 2020-02-10 ENCOUNTER — Non-Acute Institutional Stay (SKILLED_NURSING_FACILITY): Payer: Medicare PPO | Admitting: Internal Medicine

## 2020-02-10 ENCOUNTER — Encounter: Payer: Self-pay | Admitting: Internal Medicine

## 2020-02-10 DIAGNOSIS — K219 Gastro-esophageal reflux disease without esophagitis: Secondary | ICD-10-CM

## 2020-02-10 DIAGNOSIS — D72829 Elevated white blood cell count, unspecified: Secondary | ICD-10-CM

## 2020-02-10 DIAGNOSIS — F339 Major depressive disorder, recurrent, unspecified: Secondary | ICD-10-CM

## 2020-02-10 DIAGNOSIS — R1319 Other dysphagia: Secondary | ICD-10-CM

## 2020-02-10 DIAGNOSIS — J690 Pneumonitis due to inhalation of food and vomit: Secondary | ICD-10-CM

## 2020-02-10 LAB — CBC AND DIFFERENTIAL
HCT: 33 — AB (ref 36–46)
Hemoglobin: 10.9 — AB (ref 12.0–16.0)
Neutrophils Absolute: 8623
Platelets: 297 (ref 150–399)
WBC: 12.7

## 2020-02-10 LAB — CBC: RBC: 3.7 — AB (ref 3.87–5.11)

## 2020-02-10 NOTE — Progress Notes (Signed)
Location:   [facility] Room Number: 3 Place of Service:  SNF [phone]) Provider:  [name], MD    Patient Care Team: [name], MD as PCP - General (Internal Medicine) Lindwood Coke, MD as Consulting Physician (Dermatology) Christophe Louis, MD as Consulting Physician (Obstetrics and Gynecology) Latanya Maudlin, MD as Consulting Physician (Orthopedic Surgery) Garlan Fair, MD as Consulting Physician (Gastroenterology) Barnett Abu., MD as Consulting Physician (Cardiology) Neldon Mc Donnamarie Poag, MD as Consulting Physician (Allergy and Immunology) Rexene Alberts, MD as Consulting Physician (Cardiothoracic Surgery) Melida Quitter, MD as Consulting Physician (Otolaryngology) Ngetich, Nelda Bucks, NP as Nurse Practitioner (Family Medicine)  Extended Emergency Contact Information Primary Emergency Contact: Kathie Rhodes of [place] Phone: [phone] Mobile Phone: [phone] Relation: Legal Guardian Secondary Emergency Contact: Leana Gamer [place] of [place] Phone: [phone] Relation: Uncle  Code Status:  DNR Goals of care: Advanced Directive information Advanced Directives 02/10/2020  Does Patient Have a Medical Advance Directive? Yes  Type of Advance Directive Living will;Healthcare Power of [place];Out of facility DNR (pink MOST or yellow form)  Does patient want to make changes to medical advance directive? No - Patient declined  Copy of [place] in Chart? Yes - validated most recent copy scanned in chart (See row information)  Pre-existing out of facility DNR order (yellow form or pink MOST form) Pink MOST form placed in chart (order not valid for inpatient use)     Chief Complaint  Patient presents with  . Acute Visit    Pneumonia.    HPI:  Pt is a 86 y.o. female seen today for an acute visit for Follow up for her Pneuminia And Leucocytsis  Patient has h/o Hyperlipidemia,  Osteoporosis, Allergic Rhinitis and Dementia Alzheimer, Dysphagia s/p Dilatation 6/20Has h/o Choking Episodes  Long term resident  Recent diagnosis of Aspiration Pneumonia. After another episode of Choking Chest Xray was negative but She was lethargic. White count back came at 22 k She was started on doxycycline.  Today patient feels little better.  No shortness of breath chest pain fever.  Her Covid test was negative.  She she is walking now though continues to be weak. White count repeated today was 12.7   Past Medical History:  Diagnosis Date  . Abnormal CT scan, chest September 2011   Scar tissue  . Allergic rhinitis   . Alzheimer's dementia ([place])   . Asthma   . Diabetes mellitus    borderline and under control-diet ,exercise  . Esophageal stricture   . Family history of colon cancer    Mother  . GERD (gastroesophageal reflux disease)   . Hiatal hernia   . HLD (hyperlipidemia)   . Hypertension   . Osteopenia   . Pneumonia 12-15 yrs.ago   yrs. ago  . TIA (transient ischemic attack)   . Vitamin D deficiency    Past Surgical History:  Procedure Laterality Date  . ABDOMINAL HYSTERECTOMY  1980   . BALLOON DILATION N/A 09/27/2018   Procedure: BALLOON DILATION;  Surgeon: Jackquline Denmark, MD;  Location: [facility];  Service: Endoscopy;  Laterality: N/A;  . BIOPSY  09/27/2018   Procedure: BIOPSY;  Surgeon: Jackquline Denmark, MD;  Location: [facility];  Service: Endoscopy;;  . CATARACT EXTRACTION Left 2014  . ESOPHAGOGASTRODUODENOSCOPY (EGD) WITH PROPOFOL N/A 09/27/2018   Procedure: ESOPHAGOGASTRODUODENOSCOPY (EGD) WITH PROPOFOL;  Surgeon: Jackquline Denmark, MD;  [facility];  Service: Endoscopy;  Laterality: N/A;  . SHOULDER OPEN  ROTATOR CUFF REPAIR  05/22/2011   Procedure: ROTATOR CUFF REPAIR SHOULDER OPEN;  Surgeon: Tobi Bastos, MD;  Location: [facility];  Service: Orthopedics;  Laterality: Left;  . THORACOTOMY / DECORTICATION PARIETAL PLEURA Right 204   empyema  .  TONSILLECTOMY      Allergies  Allergen Reactions  . Aricept [Donepezil Hcl] Diarrhea  . Crestor [Rosuvastatin Calcium]     Myalgia  . Lipitor [Atorvastatin Calcium]   . Lovastatin     Myalgia  . Micardis [Telmisartan]     Fatigue  . Sertraline Diarrhea  . Welchol [Colesevelam Hcl]     Constipation  . Zetia [Ezetimibe]     Myalgia  . Zocor [Simvastatin]     Myalgia    Allergies as of 02/10/2020      Reactions   Aricept [donepezil Hcl] Diarrhea   Crestor [rosuvastatin Calcium]    Myalgia   Lipitor [atorvastatin Calcium]    Lovastatin    Myalgia   Micardis [telmisartan]    Fatigue   Sertraline Diarrhea   Welchol [colesevelam Hcl]    Constipation   Zetia [ezetimibe]    Myalgia   Zocor [simvastatin]    Myalgia      Medication List       Accurate as of February 10, 2020 11:42 AM. If you have any questions, ask your nurse or doctor.        acetaminophen 325 MG tablet Commonly known as: TYLENOL Take 325 mg by mouth every 6 (six) hours as needed for headache. Take Two Tablets ( 650 ) as needed.   aspirin EC 81 MG tablet Take 81 mg by mouth daily.   cetirizine 10 MG tablet Commonly known as: ZYRTEC Take 10 mg by mouth daily.   chlorhexidine 0.12 % solution Commonly known as: PERIDEX Use as directed 5 mLs in the mouth or throat in the morning. Brush on 1 tsp. of solution to teeth and gums with a toothbrush after AM mouth care. Spit out excess and do not rinse.   escitalopram 10 MG tablet Commonly known as: LEXAPRO Take 1 tablet (10 mg total) by mouth daily.   Liquid Calcium/Vitamin D 600-200 MG-UNIT Caps Generic drug: Calcium Carbonate-Vitamin D Take 1 tablet by mouth.   memantine 28 MG Cp24 24 hr capsule Commonly known as: NAMENDA XR TAKE ONE CAPSULE ONCE A DAY TO PRESERVE MEMORY.   omeprazole 20 MG capsule Commonly known as: PRILOSEC Take 20 mg by mouth daily. Do not crush; Open capsule and sprinkle on applesauce   PreserVision AREDS 2 Chew Chew  1 tablet by mouth in the morning and at bedtime.   PreviDent 5000 Booster Plus 1.1 % Pste Generic drug: Sodium Fluoride Place 1 application onto teeth every evening.   rivastigmine 9.5 mg/24hr Commonly known as: EXELON Apply fresh patch daily and remove old patch to help preserve memory   zinc oxide 20 % ointment Apply 1 application topically as needed for irritation. Apply to buttocks/peri topical as needed       Review of Systems  Constitutional: Positive for activity change and appetite change.  Respiratory: Positive for cough and shortness of breath.   Cardiovascular: Negative.   Gastrointestinal: Negative.   Genitourinary: Negative for dysuria.  Musculoskeletal: Positive for gait problem.  Skin: Negative.   Neurological: Positive for weakness.  Psychiatric/Behavioral: Positive for confusion.    Immunization History  Administered Date(s) Administered  . Influenza, High Dose Seasonal PF 01/14/2019, 01/04/2020  . Influenza,inj,Quad PF,6+ Mos 12/29/2017  . Influenza-Unspecified 12/23/2013,  12/01/2014, 01/11/2016  . Moderna SARS-COVID-2 Vaccination 04/05/2019, 05/03/2019  . Pneumococcal Conjugate-13 11/22/2013  . Pneumococcal Polysaccharide-23 07/01/2006  . Tdap 12/17/2011  . Zoster 12/25/2005   Pertinent  Health Maintenance Due  Topic Date Due  . OPHTHALMOLOGY EXAM  Never done  . URINE MICROALBUMIN  02/11/2017  . HEMOGLOBIN A1C  12/25/2019  . FOOT EXAM  04/28/2020  . INFLUENZA VACCINE  Completed  . DEXA SCAN  Completed  . PNA vac Low Risk Adult  Completed   Fall Risk  06/17/2018 04/22/2018 01/22/2017 07/23/2016 04/25/2015  Falls in the past year? - 0 No No No  Number falls in past yr: - 0 - - -  Injury with Fall? - 0 - - -  Risk for fall due to : Impaired vision - - - -   Functional Status Survey:    Vitals:   02/10/20 1139  BP: 128/68  Pulse: 63  Resp: 17  Temp: (!) 96.4 F (35.8 C)  SpO2: 94%  Weight: 145 lb (65.8 kg)  Height: 5' (1.524 m)   Body  mass index is 28.32 kg/m. Physical Exam Vitals reviewed.  Constitutional:      Appearance: Normal appearance.  HENT:     Head: Normocephalic.     Nose: Nose normal.     Mouth/Throat:     Mouth: Mucous membranes are moist.  Eyes:     Pupils: Pupils are equal, round, and reactive to light.  Cardiovascular:     Rate and Rhythm: Normal rate and regular rhythm.     Pulses: Normal pulses.  Pulmonary:     Effort: Pulmonary effort is normal.     Breath sounds: Rales present.  Abdominal:     General: Abdomen is flat. Bowel sounds are normal.     Palpations: Abdomen is soft.  Musculoskeletal:        General: No swelling.     Cervical back: Neck supple.  Skin:    General: Skin is warm.  Neurological:     General: No focal deficit present.     Mental Status: She is alert.  Psychiatric:        Mood and Affect: Mood normal.        Thought Content: Thought content normal.     Labs reviewed: Recent Labs    06/02/19 0000  NA 139  K 4.1  CL 103  CO2 28*  BUN 20  CREATININE 0.6  CALCIUM 8.9   Recent Labs    06/02/19 0000  AST 17  ALT 15  ALKPHOS 50  ALBUMIN 3.8   Recent Labs    06/02/19 0000  WBC 9.9  NEUTROABS 5,495  HGB 12.7  HCT 37  PLT 247   Lab Results  Component Value Date   TSH 3.33 06/24/2019   Lab Results  Component Value Date   HGBA1C 5.9 06/24/2019   Lab Results  Component Value Date   CHOL 275 (H) 04/06/2018   HDL 52 04/06/2018   LDLCALC 190 (H) 04/06/2018   TRIG 161 (H) 04/06/2018   CHOLHDL 5.3 (H) 04/06/2018    Significant Diagnostic Results in last 30 days:  No results found.  Assessment/Plan  Aspiration pneumonia, unspecified aspiration pneumonia type, unspecified laterality, unspecified part of lung (HCC) On Doxycyline Afebrile POX stable Doing Better Continue to monitor COvid in facility was negative WBC down from 22 to 12.7  Esophageal dysphagia Will try Reglan 5 mg BID If continues with Episodes will send to GI again  for follow up  Gastroesophageal reflux disease, unspecified whether esophagitis present Continue on Prilosec Depression, recurrent (HCC) On Lexapro Dementia On Exelon and Namenda   Family/ staff Communication:   Labs/tests ordered:

## 2020-02-17 LAB — COMPREHENSIVE METABOLIC PANEL
Albumin: 3.2 — AB (ref 3.5–5.0)
Calcium: 8.4 — AB (ref 8.7–10.7)
GFR calc Af Amer: 97
GFR calc non Af Amer: 84
Globulin: 2.6

## 2020-02-17 LAB — HEPATIC FUNCTION PANEL
ALT: 12 (ref 7–35)
AST: 13 (ref 13–35)
Alkaline Phosphatase: 63 (ref 25–125)
Bilirubin, Total: 0.7

## 2020-02-17 LAB — CBC AND DIFFERENTIAL
HCT: 31 — AB (ref 36–46)
Hemoglobin: 10.3 — AB (ref 12.0–16.0)
Neutrophils Absolute: 8811
Platelets: 320 (ref 150–399)
WBC: 12.9

## 2020-02-17 LAB — BASIC METABOLIC PANEL
BUN: 12 (ref 4–21)
CO2: 30 — AB (ref 13–22)
Chloride: 102 (ref 99–108)
Creatinine: 0.6 (ref 0.5–1.1)
Glucose: 104
Potassium: 4.1 (ref 3.4–5.3)
Sodium: 138 (ref 137–147)

## 2020-02-17 LAB — CBC: RBC: 3.5 — AB (ref 3.87–5.11)

## 2020-03-30 ENCOUNTER — Encounter: Payer: Self-pay | Admitting: Internal Medicine

## 2020-03-30 ENCOUNTER — Non-Acute Institutional Stay (SKILLED_NURSING_FACILITY): Payer: Medicare PPO | Admitting: Internal Medicine

## 2020-03-30 DIAGNOSIS — F339 Major depressive disorder, recurrent, unspecified: Secondary | ICD-10-CM

## 2020-03-30 DIAGNOSIS — R1319 Other dysphagia: Secondary | ICD-10-CM | POA: Diagnosis not present

## 2020-03-30 DIAGNOSIS — F0280 Dementia in other diseases classified elsewhere without behavioral disturbance: Secondary | ICD-10-CM

## 2020-03-30 DIAGNOSIS — G301 Alzheimer's disease with late onset: Secondary | ICD-10-CM | POA: Diagnosis not present

## 2020-03-30 DIAGNOSIS — K219 Gastro-esophageal reflux disease without esophagitis: Secondary | ICD-10-CM | POA: Diagnosis not present

## 2020-03-30 NOTE — Progress Notes (Signed)
Location:  [facility] Room Number: 3-A Place of Service:  SNF ([phone]) Provider:  Mahlon Gammon, MD  Patient Care Team: Mahlon Gammon, MD as PCP - General (Internal Medicine) Leta Speller, MD as Consulting Physician (Dermatology) Gerald Leitz, MD as Consulting Physician (Obstetrics and Gynecology) Ranee Gosselin, MD as Consulting Physician (Orthopedic Surgery) Charolett Bumpers, MD as Consulting Physician (Gastroenterology) Francisca December., MD as Consulting Physician (Cardiology) Lucie Leather Alvira Philips, MD as Consulting Physician (Allergy and Immunology) Purcell Nails, MD as Consulting Physician (Cardiothoracic Surgery) Christia Reading, MD as Consulting Physician (Otolaryngology) Ngetich, Donalee Citrin, NP as Nurse Practitioner (Family Medicine)  Extended Emergency Contact Information Primary Emergency Contact: Elnita Maxwell of [place] Home Phone: [phone] Mobile Phone: [phone] Relation: Legal Guardian Secondary Emergency Contact: Stephens Shire [place] of [place] Home Phone: [phone] Relation: Uncle  Code Status:  DNR Goals of care: Advanced Directive information Advanced Directives 02/10/2020  Does Patient Have a Medical Advance Directive? Yes  Type of Advance Directive Living will;Healthcare Power of [place];Out of facility DNR (pink MOST or yellow form)  Does patient want to make changes to medical advance directive? No - Patient declined  Copy of Healthcare Power of Attorney in Chart? Yes - validated most recent copy scanned in chart (See row information)  Pre-existing out of facility DNR order (yellow form or pink MOST form) Pink MOST form placed in chart (order not valid for inpatient use)     Chief Complaint  Patient presents with  . Medical Management of Chronic Issues    Routine [facility] visit    HPI:  Pt is an 86 y.o. female seen today for medical management of chronic diseases.     Patient  has h/o Hyperlipidemia,  Osteoporosis, Allergic Rhinitis and  Dementia Alzheimer, Dysphagia  s/p Dilatation 6/20Has h/o Choking Episodes  Long term care resident [facility] with no assist but stays very confused Has episodes of Choking but has done well since been on Reglan Has lost few pounds. Needs Help with her ADLS. No behavior issues    Past Medical History:  Diagnosis Date  . Abnormal CT scan, chest September 2011   Scar tissue  . Allergic rhinitis   . Alzheimer's dementia (HCC)   . Asthma   . Diabetes mellitus    borderline and under control-diet ,exercise  . Esophageal stricture   . Family history of colon cancer    Mother  . GERD (gastroesophageal reflux disease)   . Hiatal hernia   . HLD (hyperlipidemia)   . Hypertension   . Osteopenia   . Pneumonia 12-15 yrs.ago   yrs. ago  . TIA (transient ischemic attack)   . Vitamin D deficiency    Past Surgical History:  Procedure Laterality Date  . ABDOMINAL HYSTERECTOMY  1980   . BALLOON DILATION N/A 09/27/2018   Procedure: BALLOON DILATION;  Surgeon: Lynann Bologna, MD;  [facility];  Service: Endoscopy;  Laterality: N/A;  . BIOPSY  09/27/2018   Procedure: BIOPSY;  Surgeon: Lynann Bologna, MD;  [facility];  Service: Endoscopy;;  . CATARACT EXTRACTION Left 2014  . ESOPHAGOGASTRODUODENOSCOPY (EGD) WITH PROPOFOL N/A 09/27/2018   Procedure: ESOPHAGOGASTRODUODENOSCOPY (EGD) WITH PROPOFOL;  Surgeon: Lynann Bologna, MD;  [facility];  Service: Endoscopy;  Laterality: N/A;  . SHOULDER OPEN ROTATOR CUFF REPAIR  05/22/2011   Procedure: ROTATOR CUFF REPAIR SHOULDER OPEN;  Surgeon: Jacki Cones, MD;  Location: [facility];  Service: Orthopedics;  Laterality: Left;  .  THORACOTOMY / DECORTICATION PARIETAL PLEURA Right 204   empyema  . TONSILLECTOMY      Allergies  Allergen Reactions  . Aricept [Donepezil Hcl] Diarrhea  . Crestor [Rosuvastatin Calcium]     Myalgia  . Lipitor [Atorvastatin Calcium]   .  Lovastatin     Myalgia  . Micardis [Telmisartan]     Fatigue  . Sertraline Diarrhea  . Welchol [Colesevelam Hcl]     Constipation  . Zetia [Ezetimibe]     Myalgia  . Zocor [Simvastatin]     Myalgia    Outpatient Encounter Medications as of 03/30/2020  Medication Sig  . acetaminophen (TYLENOL) 325 MG tablet Take 325 mg by mouth every 6 (six) hours as needed for headache. Take Two Tablets ( 650 ) as needed.  Marland Kitchen aspirin EC 81 MG tablet Take 81 mg by mouth daily.  . Calcium 600-200 MG-UNIT tablet Take 1 tablet by mouth daily. In the morning 7-11 am  . cetirizine (ZYRTEC) 10 MG tablet Take 10 mg by mouth daily.  . chlorhexidine (PERIDEX) 0.12 % solution Use as directed 5 mLs in the mouth or throat in the morning. Brush on 1 tsp. of solution to teeth and gums with a toothbrush after AM mouth care. Spit out excess and do not rinse.  . escitalopram (LEXAPRO) 10 MG tablet Take 1 tablet (10 mg total) by mouth daily.  Marland Kitchen lactose free nutrition (BOOST PLUS) LIQD Take 237 mLs by mouth daily. Give at [facility]  . memantine (NAMENDA XR) 28 MG CP24 24 hr capsule TAKE ONE CAPSULE ONCE A DAY TO PRESERVE MEMORY.  . metoCLOPramide (REGLAN) 5 MG tablet Take 5 mg by mouth in the morning and at bedtime.  . Multiple Vitamins-Minerals (PRESERVISION AREDS 2) CHEW Chew 1 tablet by mouth in the morning and at bedtime.  Marland Kitchen omeprazole (PRILOSEC) 20 MG capsule Take 20 mg by mouth daily. Do not crush; Open capsule and sprinkle on applesauce  . rivastigmine (EXELON) 9.5 mg/24hr Apply fresh patch daily and remove old patch to help preserve memory  . Sodium Fluoride (PREVIDENT 5000 BOOSTER PLUS) 1.1 % PSTE Place 1 application onto teeth every evening.  . zinc oxide 20 % ointment Apply 1 application topically as needed for irritation. Apply to buttocks/peri topical as needed   No facility-administered encounter medications on file as of 03/30/2020.    Review of Systems  Unable to perform ROS: Dementia    Immunization  History  Administered Date(s) Administered  . Influenza, High Dose Seasonal PF 01/14/2019, 01/04/2020  . Influenza,inj,Quad PF,6+ Mos 12/29/2017  . Influenza-Unspecified 12/23/2013, 12/01/2014, 01/11/2016  . Moderna Sars-Covid-2 Vaccination 04/05/2019, 05/03/2019  . Pneumococcal Conjugate-13 11/22/2013  . Pneumococcal Polysaccharide-23 07/01/2006  . Tdap 12/17/2011  . Zoster 12/25/2005   Pertinent  Health Maintenance Due  Topic Date Due  . OPHTHALMOLOGY EXAM  Never done  . URINE MICROALBUMIN  02/11/2017  . HEMOGLOBIN A1C  12/25/2019  . FOOT EXAM  04/28/2020  . INFLUENZA VACCINE  Completed  . DEXA SCAN  Completed  . PNA vac Low Risk Adult  Completed   Fall Risk  06/17/2018 04/22/2018 01/22/2017 07/23/2016 04/25/2015  Falls in the past year? - 0 No No No  Number falls in past yr: - 0 - - -  Injury with Fall? - 0 - - -  Risk for fall due to : Impaired vision - - - -   Functional Status Survey:    Vitals:   03/30/20 1156  BP: 124/68  Pulse: 72  Resp: 20  Temp: (!) 97.4 F (36.3 C)  TempSrc: Oral  SpO2: 94%  Weight: 139 lb (63 kg)  Height: 5' (1.524 m)   Body mass index is 27.15 kg/m. Physical Exam Vitals reviewed.  Constitutional:      Appearance: Normal appearance.  HENT:     Head: Normocephalic.     Nose: Nose normal.     Mouth/Throat:     Mouth: Mucous membranes are moist.     Pharynx: Oropharynx is clear.  Eyes:     Pupils: Pupils are equal, round, and reactive to light.  Cardiovascular:     Rate and Rhythm: Normal rate and regular rhythm.     Pulses: Normal pulses.  Pulmonary:     Effort: Pulmonary effort is normal.     Breath sounds: Normal breath sounds.  Abdominal:     General: Abdomen is flat.     Palpations: Abdomen is soft.  Musculoskeletal:        General: No swelling.     Cervical back: Neck supple.  Skin:    General: Skin is warm and dry.  Neurological:     General: No focal deficit present.     Mental Status: She is alert.     Comments:  Walks with no assist Does not follow Commands. Has Aphasia Not oriented  Psychiatric:        Mood and Affect: Mood normal.        Thought Content: Thought content normal.     Labs reviewed: Recent Labs    06/02/19 0000  NA 139  K 4.1  CL 103  CO2 28*  BUN 20  CREATININE 0.6  CALCIUM 8.9   Recent Labs    06/02/19 0000  AST 17  ALT 15  ALKPHOS 50  ALBUMIN 3.8   Recent Labs    06/02/19 0000  WBC 9.9  NEUTROABS 5,495  HGB 12.7  HCT 37  PLT 247   Lab Results  Component Value Date   TSH 3.33 06/24/2019   Lab Results  Component Value Date   HGBA1C 5.9 06/24/2019   Lab Results  Component Value Date   CHOL 275 (H) 04/06/2018   HDL 52 04/06/2018   LDLCALC 190 (H) 04/06/2018   TRIG 161 (H) 04/06/2018   CHOLHDL 5.3 (H) 04/06/2018    Significant Diagnostic Results in last 30 days:  No results found.  Assessment/Plan  Esophageal dysphagia Continue Reglan On Thin liquid Puree Diet Gastroesophageal reflux disease, On Prilosec Late onset Alzheimer's dementia without behavioral disturbance ([place]) ON Namenda and Exelon Depression, recurrent ([place]) On Lexapro   Family/ staff Communication:   Labs/tests ordered: Repeat CBC

## 2020-04-03 ENCOUNTER — Non-Acute Institutional Stay (SKILLED_NURSING_FACILITY): Payer: Medicare PPO | Admitting: Nurse Practitioner

## 2020-04-03 ENCOUNTER — Encounter: Payer: Self-pay | Admitting: Nurse Practitioner

## 2020-04-03 DIAGNOSIS — R1319 Other dysphagia: Secondary | ICD-10-CM | POA: Diagnosis not present

## 2020-04-03 DIAGNOSIS — G301 Alzheimer's disease with late onset: Secondary | ICD-10-CM | POA: Diagnosis not present

## 2020-04-03 DIAGNOSIS — F339 Major depressive disorder, recurrent, unspecified: Secondary | ICD-10-CM | POA: Diagnosis not present

## 2020-04-03 DIAGNOSIS — J309 Allergic rhinitis, unspecified: Secondary | ICD-10-CM

## 2020-04-03 DIAGNOSIS — R7303 Prediabetes: Secondary | ICD-10-CM

## 2020-04-03 DIAGNOSIS — K219 Gastro-esophageal reflux disease without esophagitis: Secondary | ICD-10-CM | POA: Diagnosis not present

## 2020-04-03 DIAGNOSIS — W19XXXA Unspecified fall, initial encounter: Secondary | ICD-10-CM

## 2020-04-03 DIAGNOSIS — F0280 Dementia in other diseases classified elsewhere without behavioral disturbance: Secondary | ICD-10-CM

## 2020-04-03 NOTE — Assessment & Plan Note (Signed)
Prediabetes diet controlled, Hgb a1c 5.9 11/24/19. Normal urine micro albumin 11/24/19,  had eye exam 09/22/19 

## 2020-04-03 NOTE — Assessment & Plan Note (Signed)
Dysphagia, choking episodes in the past. Takes Reglan  

## 2020-04-03 NOTE — Assessment & Plan Note (Signed)
Allergic rhinitis, takes Zyrtec 10mg qd  

## 2020-04-03 NOTE — Assessment & Plan Note (Signed)
Her mood is stable, on Escitalopram 10mg qd. TSH 3.33 06/24/19  

## 2020-04-03 NOTE — Assessment & Plan Note (Signed)
Dementia, takes Memantine 28mg qd, Rivastigmine 9.5mg/24hr, ASA 81mg qd  

## 2020-04-03 NOTE — Assessment & Plan Note (Signed)
GERD, stable, on Omeprazole 20mg qd.  

## 2020-04-03 NOTE — Assessment & Plan Note (Signed)
Frequent falls, contributory factors are increased frailty and limited safety awareness. 

## 2020-04-03 NOTE — Progress Notes (Signed)
Location:   SNF [place] Room Number: 3 Place of Service:  SNF (31) Provider: [facility] Akeema Broder NP  [name], MD  Patient Care Team: [name], MD as PCP - General (Internal Medicine) Lindwood Coke, MD as Consulting Physician (Dermatology) Christophe Louis, MD as Consulting Physician (Obstetrics and Gynecology) Latanya Maudlin, MD as Consulting Physician (Orthopedic Surgery) Garlan Fair, MD as Consulting Physician (Gastroenterology) Barnett Abu., MD as Consulting Physician (Cardiology) Neldon Mc Donnamarie Poag, MD as Consulting Physician (Allergy and Immunology) Rexene Alberts, MD as Consulting Physician (Cardiothoracic Surgery) Melida Quitter, MD as Consulting Physician (Otolaryngology) Ngetich, Nelda Bucks, NP as Nurse Practitioner (Family Medicine)  Extended Emergency Contact Information Primary Emergency Contact: Kathie Rhodes of [place] Phone: [phone] Mobile Phone: [phone] Relation: Legal Guardian Secondary Emergency Contact: Leana Gamer [place] of [place] Phone: [phone] Relation: Uncle  Code Status:  DNR Goals of care: Advanced Directive information Advanced Directives 04/03/2020  Does Patient Have a Medical Advance Directive? Yes  Type of Paramedic of [place];Out of facility DNR (pink MOST or yellow form)  Does patient want to make changes to medical advance directive? No - Patient declined  Copy of [place] in Chart? Yes - validated most recent copy scanned in chart (See row information)  Pre-existing out of facility DNR order (yellow form or pink MOST form) Pink MOST form placed in chart (order not valid for inpatient use)     Chief Complaint  Patient presents with  . Medical Management of Chronic Issues    Routine follow up visit.  . Quality Metric Gaps    Eye exam,urine microalbumin, hemoglobin A1C    HPI:  Pt is a 87 y.o. female seen today for  medical management of chronic diseases.       Dysphagia, choking episodes in the past. Takes Reglan Dementia, takes Memantine 28mg  qd, Rivastigmine 9.5mg /24hr, ASA 81mg  qd Her mood is stable, on Escitalopram 10mg  qd. TSH 3.33 06/24/19 GERD, stable, on Omeprazole 20mg  qd. Allergic rhinitis, takes Zyrtec 10mg  qd Frequent falls, contributory factors are increased frailty and limited safety awareness. Prediabetes diet controlled, Hgb a1c 5.9 11/24/19. Normal urine micro albumin 11/24/19, had eye exam 09/22/19  Past Medical History:  Diagnosis Date  . Abnormal CT scan, chest September 2011   Scar tissue  . Allergic rhinitis   . Alzheimer's dementia ([place])   . Asthma   . Diabetes mellitus    borderline and under control-diet ,exercise  . Esophageal stricture   . Family history of colon cancer    Mother  . GERD (gastroesophageal reflux disease)   . Hiatal hernia   . HLD (hyperlipidemia)   . Hypertension   . Osteopenia   . Pneumonia 12-15 yrs.ago   yrs. ago  . TIA (transient ischemic attack)   . Vitamin D deficiency    Past Surgical History:  Procedure Laterality Date  . ABDOMINAL HYSTERECTOMY  1980   . BALLOON DILATION N/A 09/27/2018   Procedure: BALLOON DILATION;  Surgeon: Jackquline Denmark, MD;  Location: [facility];  Service: Endoscopy;  Laterality: N/A;  . BIOPSY  09/27/2018   Procedure: BIOPSY;  Surgeon: Jackquline Denmark, MD;  [facility]: [facility];  Service: Endoscopy;;  . CATARACT EXTRACTION Left 2014  . ESOPHAGOGASTRODUODENOSCOPY (EGD) WITH PROPOFOL N/A 09/27/2018   Procedure: ESOPHAGOGASTRODUODENOSCOPY (EGD) WITH PROPOFOL;  Surgeon: Jackquline Denmark, MD;  [facility];  Service: Endoscopy;  Laterality: N/A;  . SHOULDER OPEN ROTATOR CUFF REPAIR  05/22/2011  Procedure: ROTATOR CUFF REPAIR SHOULDER OPEN;  Surgeon: Jacki Cones, MD;  Location: [facility];  Service: Orthopedics;  Laterality: Left;  .  THORACOTOMY / DECORTICATION PARIETAL PLEURA Right 204   empyema  . TONSILLECTOMY      Allergies  Allergen Reactions  . Aricept [Donepezil Hcl] Diarrhea  . Crestor [Rosuvastatin Calcium]     Myalgia  . Lipitor [Atorvastatin Calcium]   . Lovastatin     Myalgia  . Micardis [Telmisartan]     Fatigue  . Sertraline Diarrhea  . Welchol [Colesevelam Hcl]     Constipation  . Zetia [Ezetimibe]     Myalgia  . Zocor [Simvastatin]     Myalgia    Allergies as of 04/03/2020      Reactions   Aricept [donepezil Hcl] Diarrhea   Crestor [rosuvastatin Calcium]    Myalgia   Lipitor [atorvastatin Calcium]    Lovastatin    Myalgia   Micardis [telmisartan]    Fatigue   Sertraline Diarrhea   Welchol [colesevelam Hcl]    Constipation   Zetia [ezetimibe]    Myalgia   Zocor [simvastatin]    Myalgia      Medication List       Accurate as of April 03, 2020 11:59 PM. If you have any questions, ask your nurse or doctor.        acetaminophen 325 MG tablet Commonly known as: TYLENOL Take 325 mg by mouth every 6 (six) hours as needed for headache. Take Two Tablets ( 650 ) as needed.   aspirin EC 81 MG tablet Take 81 mg by mouth daily.   Calcium 600-200 MG-UNIT tablet Take 1 tablet by mouth daily. In the morning 7-11 am   cetirizine 10 MG tablet Commonly known as: ZYRTEC Take 10 mg by mouth daily.   chlorhexidine 0.12 % solution Commonly known as: PERIDEX Use as directed 5 mLs in the mouth or throat in the morning. Brush on 1 tsp. of solution to teeth and gums with a toothbrush after AM mouth care. Spit out excess and do not rinse.   escitalopram 10 MG tablet Commonly known as: LEXAPRO Take 1 tablet (10 mg total) by mouth daily.   lactose free nutrition Liqd Take 237 mLs by mouth daily. Give at [facility]   memantine 28 MG Cp24 24 hr capsule Commonly known as: NAMENDA XR TAKE ONE CAPSULE ONCE A DAY TO PRESERVE MEMORY.   metoCLOPramide 5 MG tablet Commonly known as: REGLAN Take 5  mg by mouth in the morning and at bedtime.   omeprazole 20 MG capsule Commonly known as: PRILOSEC Take 20 mg by mouth daily. Do not crush; Open capsule and sprinkle on applesauce   PreserVision AREDS 2 Chew Chew 1 tablet by mouth in the morning and at bedtime.   PreviDent 5000 Booster Plus 1.1 % Pste Generic drug: Sodium Fluoride Place 1 application onto teeth every evening.   rivastigmine 9.5 mg/24hr Commonly known as: EXELON Apply fresh patch daily and remove old patch to help preserve memory   zinc oxide 20 % ointment Apply 1 application topically as needed for irritation. Apply to buttocks/peri topical as needed       Review of Systems  Constitutional: Negative for activity change, fever and unexpected weight change.       #9Ibs weight gained in the past month.   HENT: Positive for hearing loss and trouble swallowing. Negative for congestion and voice change.   Eyes: Negative for visual disturbance.  Respiratory: Negative for cough,  choking and shortness of breath.   Cardiovascular: Negative for leg swelling.  Gastrointestinal: Negative for abdominal pain and constipation.  Genitourinary: Negative for dysuria and urgency.  Musculoskeletal: Positive for gait problem.  Skin: Negative for color change.  Neurological: Negative for speech difficulty and headaches.       Dementia  Psychiatric/Behavioral: Positive for confusion. Negative for behavioral problems and sleep disturbance. The patient is not nervous/anxious.     Immunization History  Administered Date(s) Administered  . Influenza, High Dose Seasonal PF 01/14/2019, 01/04/2020  . Influenza,inj,Quad PF,6+ Mos 12/29/2017  . Influenza-Unspecified 12/01/2014, 01/11/2016, 01/04/2020  . Moderna Sars-Covid-2 Vaccination 04/05/2019, 05/03/2019, 02/14/2020  . Pneumococcal Conjugate-13 11/22/2013  . Pneumococcal Polysaccharide-23 07/01/2006  . Tdap 12/17/2011  . Zoster 12/25/2005   Pertinent  Health Maintenance Due   Topic Date Due  . OPHTHALMOLOGY EXAM  Never done  . URINE MICROALBUMIN  02/11/2017  . HEMOGLOBIN A1C  12/25/2019  . FOOT EXAM  04/28/2020  . INFLUENZA VACCINE  Completed  . DEXA SCAN  Completed  . PNA vac Low Risk Adult  Completed   Fall Risk  06/17/2018 04/22/2018 01/22/2017 07/23/2016 04/25/2015  Falls in the past year? - 0 No No No  Number falls in past yr: - 0 - - -  Injury with Fall? - 0 - - -  Risk for fall due to : Impaired vision - - - -   Functional Status Survey:    Vitals:   04/03/20 1430  BP: 124/68  Pulse: 72  Temp: 98.5 F (36.9 C)  SpO2: 97%  Weight: 140 lb (63.5 kg)  Height: 5' (1.524 m)   Body mass index is 27.34 kg/m. Physical Exam Vitals and nursing note reviewed.  Constitutional:      Appearance: Normal appearance.  HENT:     Head: Normocephalic and atraumatic.     Jaw: No swelling or pain on movement.     Salivary Glands: Right salivary gland is not diffusely enlarged or tender. Left salivary gland is not diffusely enlarged or tender.     Mouth/Throat:     Mouth: Mucous membranes are dry.  Eyes:     Extraocular Movements: Extraocular movements intact.     Conjunctiva/sclera: Conjunctivae normal.     Pupils: Pupils are equal, round, and reactive to light.  Cardiovascular:     Rate and Rhythm: Normal rate and regular rhythm.     Heart sounds: No murmur heard.   Pulmonary:     Effort: Pulmonary effort is normal.     Breath sounds: Rales present.     Comments: Rales right mid to lower lungs, also posterior left lung base.  Chest:     Chest wall: No tenderness.  Abdominal:     General: Bowel sounds are normal.     Palpations: Abdomen is soft.     Tenderness: There is no abdominal tenderness.  Musculoskeletal:     Cervical back: Normal range of motion and neck supple.     Right lower leg: No edema.     Left lower leg: No edema.  Skin:    General: Skin is warm and dry.  Neurological:     Mental Status: She is alert. Mental status is at  baseline.     Gait: Gait abnormal.     Comments: Oriented to self.   Psychiatric:     Comments: Pleasantly confused, but able to follow simple directions     Labs reviewed: Recent Labs    06/02/19 0000 02/08/20 0000 02/17/20  0000  NA 139 139 138  K 4.1 3.5 4.1  CL 103 104 102  CO2 28* 27* 30*  BUN 20 31* 12  CREATININE 0.6 0.9 0.6  CALCIUM 8.9 9.0 8.4*   Recent Labs    06/02/19 0000 02/08/20 0000 02/17/20 0000  AST 17 13 13   ALT 15 10 12   ALKPHOS 50 61 63  ALBUMIN 3.8 3.8 3.2*   Recent Labs    02/08/20 0000 02/10/20 0000 02/17/20 0000  WBC 22.0 12.7 12.9  NEUTROABS 17,512.00 8,623.00 8,811.00  HGB 10.4* 10.9* 10.3*  HCT 31* 33* 31*  PLT 231 297 320   Lab Results  Component Value Date   TSH 3.33 06/24/2019   Lab Results  Component Value Date   HGBA1C 5.9 06/24/2019   Lab Results  Component Value Date   CHOL 275 (H) 04/06/2018   HDL 52 04/06/2018   LDLCALC 190 (H) 04/06/2018   TRIG 161 (H) 04/06/2018   CHOLHDL 5.3 (H) 04/06/2018    Significant Diagnostic Results in last 30 days:  No results found.  Assessment/Plan Dysphagia Dysphagia, choking episodes in the past. Takes Reglan   Alzheimer's dementia Dementia, takes Memantine 28mg  qd, Rivastigmine 9.5mg /24hr, ASA 81mg  qd  Depression, recurrent (HCC) Her mood is stable, on Escitalopram 10mg  qd. TSH 3.33 06/24/19   GERD (gastroesophageal reflux disease) GERD, stable, on Omeprazole 20mg  qd.   Allergic rhinitis Allergic rhinitis, takes Zyrtec 10mg  qd  Fall Frequent falls, contributory factors are increased frailty and limited safety awareness.   Prediabetes Prediabetes diet controlled, Hgb a1c 5.9 11/24/19. Normal urine micro albumin 11/24/19, had eye exam 09/22/19   Family/ staff Communication: plan of care reviewed with the patient and charge nurse.   Labs/tests ordered:  None  Time spend 35 minutes.

## 2020-04-04 ENCOUNTER — Encounter: Payer: Self-pay | Admitting: Nurse Practitioner

## 2020-04-10 ENCOUNTER — Non-Acute Institutional Stay (SKILLED_NURSING_FACILITY): Payer: Medicare PPO | Admitting: Nurse Practitioner

## 2020-04-10 ENCOUNTER — Encounter: Payer: Self-pay | Admitting: Nurse Practitioner

## 2020-04-10 DIAGNOSIS — J309 Allergic rhinitis, unspecified: Secondary | ICD-10-CM

## 2020-04-10 DIAGNOSIS — G301 Alzheimer's disease with late onset: Secondary | ICD-10-CM | POA: Diagnosis not present

## 2020-04-10 DIAGNOSIS — F339 Major depressive disorder, recurrent, unspecified: Secondary | ICD-10-CM | POA: Diagnosis not present

## 2020-04-10 DIAGNOSIS — J690 Pneumonitis due to inhalation of food and vomit: Secondary | ICD-10-CM | POA: Diagnosis not present

## 2020-04-10 DIAGNOSIS — R1319 Other dysphagia: Secondary | ICD-10-CM

## 2020-04-10 DIAGNOSIS — W19XXXA Unspecified fall, initial encounter: Secondary | ICD-10-CM

## 2020-04-10 DIAGNOSIS — R7303 Prediabetes: Secondary | ICD-10-CM

## 2020-04-10 DIAGNOSIS — K219 Gastro-esophageal reflux disease without esophagitis: Secondary | ICD-10-CM

## 2020-04-10 DIAGNOSIS — F0280 Dementia in other diseases classified elsewhere without behavioral disturbance: Secondary | ICD-10-CM

## 2020-04-10 NOTE — Assessment & Plan Note (Signed)
Her mood is stable, on Escitalopram 10mg  qd. TSH 3.33 06/24/19

## 2020-04-10 NOTE — Assessment & Plan Note (Signed)
Dysphagia, choking episodes in the past. Takes Reglan

## 2020-04-10 NOTE — Assessment & Plan Note (Signed)
Dementia, takes Memantine 28mg qd, Rivastigmine 9.5mg/24hr, ASA 81mg qd  

## 2020-04-10 NOTE — Assessment & Plan Note (Signed)
Frequent falls, contributory factors are increased frailty and limited safety awareness. 

## 2020-04-10 NOTE — Assessment & Plan Note (Signed)
GERD, stable, on Omeprazole 20mg qd.  

## 2020-04-10 NOTE — Assessment & Plan Note (Signed)
Prediabetes diet controlled, Hgb a1c 5.9 11/24/19. Normal urine micro albumin 11/24/19,  had eye exam 09/22/19 

## 2020-04-10 NOTE — Assessment & Plan Note (Signed)
aspiration pneumonia, on set 04/09/19, DuoNeb tid start,  CXR 04/10/20 mild patchy right lower lung opacity, new. Doxycycline 100mg  bid x 7 days started subsequently. The patient appears tired, poor appetite, no O2 desaturation or wheezes. She is afebrile. Noted decreased air entry in the right lungs. Will dc DuoNeb, observe.

## 2020-04-10 NOTE — Progress Notes (Signed)
Location:    [place] Room Number: 3 Place of Service:  SNF (31) Provider:  Asma Boldon, Lennie Odor NP  [name], MD  Patient Care Team: [name], MD as PCP - General (Internal Medicine) Lindwood Coke, MD as Consulting Physician (Dermatology) Christophe Louis, MD as Consulting Physician (Obstetrics and Gynecology) Latanya Maudlin, MD as Consulting Physician (Orthopedic Surgery) Garlan Fair, MD as Consulting Physician (Gastroenterology) Barnett Abu., MD as Consulting Physician (Cardiology) Neldon Mc Donnamarie Poag, MD as Consulting Physician (Allergy and Immunology) Rexene Alberts, MD as Consulting Physician (Cardiothoracic Surgery) Melida Quitter, MD as Consulting Physician (Otolaryngology) Ngetich, Nelda Bucks, NP as Nurse Practitioner (Family Medicine)  Extended Emergency Contact Information Primary Emergency Contact: Kathie Rhodes of [place] Phone: [phone] Mobile Phone: [phone] Relation: Legal Guardian Secondary Emergency Contact: Leana Gamer [place] of [place] Phone: [phone] Relation: Uncle  Code Status:  DNR Goals of care: Advanced Directive information Advanced Directives 04/03/2020  Does Patient Have a Medical Advance Directive? Yes  Type of Paramedic of [place];Out of facility DNR (pink MOST or yellow form)  Does patient want to make changes to medical advance directive? No - Patient declined  Copy of [place] in Chart? Yes - validated most recent copy scanned in chart (See row information)  Pre-existing out of facility DNR order (yellow form or pink MOST form) Pink MOST form placed in chart (order not valid for inpatient use)     Chief Complaint  Patient presents with  . Acute Visit    Aspiration    HPI:  Pt is a 87 y.o. female seen today for an acute visit for aspiration pneumonia, on set 04/09/19, DuoNeb tid start,  CXR 04/10/20 mild patchy  right lower lung opacity, new. Doxycycline 113m bid x 7 days started subsequently. The patient appears tired, poor appetite, no O2 desaturation or wheezes. She is afebrile. Noted decreased air entry in the right lungs.   Dysphagia, choking episodes in the past. Takes Reglan Dementia, takes Memantine 210mqd, Rivastigmine 9.84m110m4hr, ASA 44m74m Her mood is stable, on Escitalopram 10mg884m TSH 3.33 06/24/19 GERD, stable, on Omeprazole 20mg 64mAllergic rhinitis, takes Zyrtec 10mg q72mequent falls, contributory factors are increased frailty and limited safety awareness. Prediabetes diet controlled, Hgb a1c 5.9 11/24/19. Normal urine micro albumin 11/24/19, had eye exam 09/22/19     Past Medical History:  Diagnosis Date  . Abnormal CT scan, chest September 2011   Scar tissue  . Allergic rhinitis   . Alzheimer's dementia (HCC)   [place]   . Diabetes mellitus    borderline and under control-diet ,exercise  . Esophageal stricture   . Family history of colon cancer    Mother  . GERD (gastroesophageal reflux disease)   . Hiatal hernia   . HLD (hyperlipidemia)   . Hypertension   . Osteopenia   . Pneumonia 12-15 yrs.ago   yrs. ago  . TIA (transient ischemic attack)   . Vitamin D deficiency    Past Surgical History:  Procedure Laterality Date  . ABDOMINAL HYSTERECTOMY  1980   . BALLOON DILATION N/A 09/27/2018   Procedure: BALLOON DILATION;  Surgeon: Gupta, Jackquline DenmarkLocation: [facility];  Service: Endoscopy;  Laterality: N/A;  . BIOPSY  09/27/2018   Procedure: BIOPSY;  Surgeon: Gupta, Jackquline DenmarkLocation: [facility];  Service: Endoscopy;;  . CATARACT EXTRACTION Left 2014  . ESOPHAGOGASTRODUODENOSCOPY (EGD) WITH PROPOFOL N/A 09/27/2018  Procedure: ESOPHAGOGASTRODUODENOSCOPY (EGD) WITH PROPOFOL;  Surgeon: Jackquline Denmark, MD;  [facility];  Service: Endoscopy;  Laterality: N/A;  . SHOULDER  OPEN ROTATOR CUFF REPAIR  05/22/2011   Procedure: ROTATOR CUFF REPAIR SHOULDER OPEN;  Surgeon: Tobi Bastos, MD;  [facility];  Service: Orthopedics;  Laterality: Left;  . THORACOTOMY / DECORTICATION PARIETAL PLEURA Right 204   empyema  . TONSILLECTOMY      Allergies  Allergen Reactions  . Aricept [Donepezil Hcl] Diarrhea  . Crestor [Rosuvastatin Calcium]     Myalgia  . Lipitor [Atorvastatin Calcium]   . Lovastatin     Myalgia  . Micardis [Telmisartan]     Fatigue  . Sertraline Diarrhea  . Welchol [Colesevelam Hcl]     Constipation  . Zetia [Ezetimibe]     Myalgia  . Zocor [Simvastatin]     Myalgia    Allergies as of 04/10/2020      Reactions   Aricept [donepezil Hcl] Diarrhea   Crestor [rosuvastatin Calcium]    Myalgia   Lipitor [atorvastatin Calcium]    Lovastatin    Myalgia   Micardis [telmisartan]    Fatigue   Sertraline Diarrhea   Welchol [colesevelam Hcl]    Constipation   Zetia [ezetimibe]    Myalgia   Zocor [simvastatin]    Myalgia      Medication List       Accurate as of April 10, 2020 11:59 PM. If you have any questions, ask your nurse or doctor.        acetaminophen 325 MG tablet Commonly known as: TYLENOL Take 325 mg by mouth every 6 (six) hours as needed for headache. Take Two Tablets ( 650 ) as needed.   aspirin EC 81 MG tablet Take 81 mg by mouth daily.   Calcium 600-200 MG-UNIT tablet Take 1 tablet by mouth daily. In the morning 7-11 am   cetirizine 10 MG tablet Commonly known as: ZYRTEC Take 10 mg by mouth daily.   chlorhexidine 0.12 % solution Commonly known as: PERIDEX Use as directed 5 mLs in the mouth or throat in the morning. Brush on 1 tsp. of solution to teeth and gums with a toothbrush after AM mouth care. Spit out excess and do not rinse.   doxycycline 100 MG capsule Commonly known as: VIBRAMYCIN Take 100 mg by mouth 2 (two) times daily.   escitalopram 10 MG tablet Commonly known as: LEXAPRO Take 1 tablet  (10 mg total) by mouth daily.   ipratropium-albuterol 0.5-2.5 (3) MG/3ML Soln Commonly known as: DUONEB Take 3 mLs by nebulization 3 (three) times daily.   lactose free nutrition Liqd Take 237 mLs by mouth daily. Give at [facility]   memantine 28 MG Cp24 24 hr capsule Commonly known as: NAMENDA XR TAKE ONE CAPSULE ONCE A DAY TO PRESERVE MEMORY.   metoCLOPramide 5 MG tablet Commonly known as: REGLAN Take 5 mg by mouth in the morning and at bedtime.   omeprazole 20 MG capsule Commonly known as: PRILOSEC Take 20 mg by mouth daily. Do not crush; Open capsule and sprinkle on applesauce   PreserVision AREDS 2 Chew Chew 1 tablet by mouth in the morning and at bedtime.   PreviDent 5000 Booster Plus 1.1 % Pste Generic drug: Sodium Fluoride Place 1 application onto teeth every evening.   rivastigmine 9.5 mg/24hr Commonly known as: EXELON Apply fresh patch daily and remove old patch to help preserve memory   saccharomyces boulardii 250 MG capsule Commonly known as: FLORASTOR Take  250 mg by mouth 2 (two) times daily.   zinc oxide 20 % ointment Apply 1 application topically as needed for irritation. Apply to buttocks/peri topical as needed       Review of Systems  Constitutional: Positive for activity change, appetite change and fatigue. Negative for chills, diaphoresis and fever.  HENT: Positive for hearing loss and trouble swallowing. Negative for congestion and voice change.   Eyes: Negative for visual disturbance.  Respiratory: Positive for cough. Negative for choking and shortness of breath.   Cardiovascular: Negative for leg swelling.  Gastrointestinal: Negative for abdominal pain and constipation.  Genitourinary: Negative for dysuria and urgency.  Musculoskeletal: Positive for gait problem.  Skin: Negative for color change.  Neurological: Negative for speech difficulty and headaches.       Dementia  Psychiatric/Behavioral: Positive for confusion. Negative for behavioral  problems and sleep disturbance. The patient is not nervous/anxious.     Immunization History  Administered Date(s) Administered  . Influenza, High Dose Seasonal PF 01/14/2019, 01/04/2020  . Influenza,inj,Quad PF,6+ Mos 12/29/2017  . Influenza-Unspecified 12/01/2014, 01/11/2016, 01/04/2020  . Moderna Sars-Covid-2 Vaccination 04/05/2019, 05/03/2019, 02/14/2020  . Pneumococcal Conjugate-13 11/22/2013  . Pneumococcal Polysaccharide-23 07/01/2006  . Tdap 12/17/2011  . Zoster 12/25/2005   Pertinent  Health Maintenance Due  Topic Date Due  . OPHTHALMOLOGY EXAM  Never done  . URINE MICROALBUMIN  02/11/2017  . HEMOGLOBIN A1C  12/25/2019  . FOOT EXAM  04/28/2020  . INFLUENZA VACCINE  Completed  . DEXA SCAN  Completed  . PNA vac Low Risk Adult  Completed   Fall Risk  06/17/2018 04/22/2018 01/22/2017 07/23/2016 04/25/2015  Falls in the past year? - 0 No No No  Number falls in past yr: - 0 - - -  Injury with Fall? - 0 - - -  Risk for fall due to : Impaired vision - - - -   Functional Status Survey:    Vitals:   04/10/20 1118  BP: (!) 103/56  Pulse: 64  Resp: 18  Temp: (!) 97 F (36.1 C)  SpO2: 91%  Weight: 140 lb (63.5 kg)  Height: 5' (1.524 m)   Body mass index is 27.34 kg/m. Physical Exam Vitals and nursing note reviewed.  Constitutional:      Appearance: Normal appearance.  HENT:     Head: Normocephalic and atraumatic.     Jaw: No swelling or pain on movement.     Salivary Glands: Right salivary gland is not diffusely enlarged or tender. Left salivary gland is not diffusely enlarged or tender.     Mouth/Throat:     Mouth: Mucous membranes are dry.  Eyes:     Extraocular Movements: Extraocular movements intact.     Conjunctiva/sclera: Conjunctivae normal.     Pupils: Pupils are equal, round, and reactive to light.  Cardiovascular:     Rate and Rhythm: Normal rate and regular rhythm.     Heart sounds: No murmur heard.   Pulmonary:     Effort: Pulmonary effort is  normal. No respiratory distress.     Breath sounds: Rales present. No wheezing or rhonchi.     Comments: Rales right mid to lower lungs, decreased air entry to lungs R>L Chest:     Chest wall: No tenderness.  Abdominal:     General: Bowel sounds are normal.     Palpations: Abdomen is soft.     Tenderness: There is no abdominal tenderness.  Musculoskeletal:     Cervical back: Normal range of motion  and neck supple.     Right lower leg: No edema.     Left lower leg: No edema.  Skin:    General: Skin is warm and dry.  Neurological:     Mental Status: She is alert. Mental status is at baseline.     Gait: Gait abnormal.     Comments: Oriented to self.   Psychiatric:     Comments: Pleasantly confused, but able to follow simple directions     Labs reviewed: Recent Labs    06/02/19 0000 02/08/20 0000 02/17/20 0000  NA 139 139 138  K 4.1 3.5 4.1  CL 103 104 102  CO2 28* 27* 30*  BUN 20 31* 12  CREATININE 0.6 0.9 0.6  CALCIUM 8.9 9.0 8.4*   Recent Labs    06/02/19 0000 02/08/20 0000 02/17/20 0000  AST _0 ALT _1 ALKPHOS 50 61 63  ALBUMIN 3.8 3.8 3.2*   Recent Labs    02/08/20 0000 02/10/20 0000 02/17/20 0000  WBC 22.0 12.7 12.9  NEUTROABS 17,512.00 8,623.00 8,811.00  HGB 10.4* 10.9* 10.3*  HCT 31* 33* 31*  PLT 231 297 320   Lab Results  Component Value Date   TSH 3.33 06/24/2019   Lab Results  Component Value Date   HGBA1C 5.9 06/24/2019   Lab Results  Component Value Date   CHOL 275 (H) 04/06/2018   HDL 52 04/06/2018   LDLCALC 190 (H) 04/06/2018   TRIG 161 (H) 04/06/2018   CHOLHDL 5.3 (H) 04/06/2018    Significant Diagnostic Results in last 30 days:  No results found.  Assessment/Plan Aspiration pneumonia (HCC) aspiration pneumonia, on set 04/09/19, DuoNeb tid start,  CXR 04/10/20 mild patchy right lower lung opacity, new. Doxycycline 152m bid x 7 days started subsequently. The patient appears tired, poor appetite, no O2 desaturation  or wheezes. She is afebrile. Noted decreased air entry in the right lungs. Will dc DuoNeb, observe.   Dysphagia Dysphagia, choking episodes in the past. Takes Reglan   Alzheimer's dementia Dementia, takes Memantine 261mqd, Rivastigmine 9.79m38m4hr, ASA 72m53m   Depression, recurrent (HCC)[place] mood is stable, on Escitalopram 10mg60m TSH 3.33 06/24/19   GERD (gastroesophageal reflux disease) GERD, stable, on Omeprazole 20mg 68m  Allergic rhinitis Allergic rhinitis, takes Zyrtec 10mg q67mFall Frequent falls, contributory factors are increased frailty and limited safety awareness.   Prediabetes Prediabetes diet controlled, Hgb a1c 5.9 11/24/19. Normal urine micro albumin 11/24/19, had eye exam 09/22/19     Family/ staff Communication: plan of care reviewed with the patient and charge nurse.   Labs/tests ordered:  CBC/diff, CMP/eGFR  Time spend 35 minutes.

## 2020-04-10 NOTE — Assessment & Plan Note (Signed)
Allergic rhinitis, takes Zyrtec 10mg  qd

## 2020-04-11 ENCOUNTER — Encounter: Payer: Self-pay | Admitting: Nurse Practitioner

## 2020-04-13 LAB — BASIC METABOLIC PANEL
BUN: 11 (ref 4–21)
CO2: 27 — AB (ref 13–22)
Chloride: 102 (ref 99–108)
Creatinine: 0.7 (ref 0.5–1.1)
Glucose: 85
Potassium: 4.7 (ref 3.4–5.3)
Sodium: 141 (ref 137–147)

## 2020-04-13 LAB — CBC: RBC: 4.11 (ref 3.87–5.11)

## 2020-04-13 LAB — COMPREHENSIVE METABOLIC PANEL
Albumin: 4 (ref 3.5–5.0)
Calcium: 9.1 (ref 8.7–10.7)
Globulin: 3.2

## 2020-04-13 LAB — CBC AND DIFFERENTIAL
HCT: 37 (ref 36–46)
Hemoglobin: 12.2 (ref 12.0–16.0)
Neutrophils Absolute: 6431
Platelets: 333 (ref 150–399)
WBC: 9.7

## 2020-04-13 LAB — HEPATIC FUNCTION PANEL
ALT: 12 (ref 7–35)
AST: 15 (ref 13–35)
Alkaline Phosphatase: 68 (ref 25–125)
Bilirubin, Total: 0.5

## 2020-05-09 ENCOUNTER — Encounter: Payer: Self-pay | Admitting: Nurse Practitioner

## 2020-05-09 ENCOUNTER — Non-Acute Institutional Stay (SKILLED_NURSING_FACILITY): Payer: Medicare PPO | Admitting: Nurse Practitioner

## 2020-05-09 DIAGNOSIS — F0150 Vascular dementia without behavioral disturbance: Secondary | ICD-10-CM | POA: Diagnosis not present

## 2020-05-09 DIAGNOSIS — R1319 Other dysphagia: Secondary | ICD-10-CM

## 2020-05-09 DIAGNOSIS — W19XXXA Unspecified fall, initial encounter: Secondary | ICD-10-CM

## 2020-05-09 DIAGNOSIS — R7303 Prediabetes: Secondary | ICD-10-CM

## 2020-05-09 DIAGNOSIS — F339 Major depressive disorder, recurrent, unspecified: Secondary | ICD-10-CM

## 2020-05-09 DIAGNOSIS — K219 Gastro-esophageal reflux disease without esophagitis: Secondary | ICD-10-CM

## 2020-05-09 DIAGNOSIS — J309 Allergic rhinitis, unspecified: Secondary | ICD-10-CM

## 2020-05-09 NOTE — Assessment & Plan Note (Signed)
mood is stable, on Escitalopram 10mg qd.TSH 3.33 06/24/19 

## 2020-05-09 NOTE — Progress Notes (Signed)
Location:    [facility] Room Number: 3 Place of Service:  SNF (31) Provider: Lennie Odor Jedaiah Rathbun NP  [name], MD  Patient Care Team: [name], MD as PCP - General (Internal Medicine) Lindwood Coke, MD as Consulting Physician (Dermatology) Christophe Louis, MD as Consulting Physician (Obstetrics and Gynecology) Latanya Maudlin, MD as Consulting Physician (Orthopedic Surgery) Garlan Fair, MD as Consulting Physician (Gastroenterology) Barnett Abu., MD as Consulting Physician (Cardiology) Neldon Mc Donnamarie Poag, MD as Consulting Physician (Allergy and Immunology) Rexene Alberts, MD as Consulting Physician (Cardiothoracic Surgery) Melida Quitter, MD as Consulting Physician (Otolaryngology) Ngetich, Nelda Bucks, NP as Nurse Practitioner (Family Medicine)  Extended Emergency Contact Information Primary Emergency Contact: Kathie Rhodes of [place] Phone: [phone] Mobile Phone: [phone] Relation: Legal Guardian Secondary Emergency Contact: Leana Gamer [place] of [place] Phone: [phone] Relation: Uncle  Code Status:  DNR Goals of care: Advanced Directive information Advanced Directives 04/03/2020  Does Patient Have a Medical Advance Directive? Yes  Type of Paramedic of [place];Out of facility DNR (pink MOST or yellow form)  Does patient want to make changes to medical advance directive? No - Patient declined  Copy of [place] in Chart? Yes - validated most recent copy scanned in chart (See row information)  Pre-existing out of facility DNR order (yellow form or pink MOST form) Pink MOST form placed in chart (order not valid for inpatient use)     Chief Complaint  Patient presents with  . Medical Management of Chronic Issues    HPI:  Pt is a 87 y.o. female seen today for medical management of chronic diseases.    Dysphagia, choking episodes in the past.Takes  Reglan. Risk for aspiration.  Dementia, takes Memantine 28mg  qd, Rivastigmine 9.5mg /24hr, ASA 81mg  qd Her mood is stable, on Escitalopram 10mg  qd.TSH 3.33 06/24/19 GERD, stable, on Omeprazole 20mg  qd.Hgb 12.2 04/13/20 Allergic rhinitis, takes Zyrtec 10mg  qd Frequent falls, contributory factors are increased frailty and limited safety awareness. Prediabetes diet controlled, Hgb a1c 5.9 11/24/19. Normal urine micro albumin 11/24/19, had eye exam 09/22/19                Past Medical History:  Diagnosis Date  . Abnormal CT scan, chest September 2011   Scar tissue  . Allergic rhinitis   . Alzheimer's dementia ([place])   . Asthma   . Diabetes mellitus    borderline and under control-diet ,exercise  . Esophageal stricture   . Family history of colon cancer    Mother  . GERD (gastroesophageal reflux disease)   . Hiatal hernia   . HLD (hyperlipidemia)   . Hypertension   . Osteopenia   . Pneumonia 12-15 yrs.ago   yrs. ago  . TIA (transient ischemic attack)   . Vitamin D deficiency    Past Surgical History:  Procedure Laterality Date  . ABDOMINAL HYSTERECTOMY  1980   . BALLOON DILATION N/A 09/27/2018   Procedure: BALLOON DILATION;  Surgeon: Jackquline Denmark, MD;  Location: [facility];  Service: Endoscopy;  Laterality: N/A;  . BIOPSY  09/27/2018   Procedure: BIOPSY;  Surgeon: Jackquline Denmark, MD;  Location: [facility];  Service: Endoscopy;;  . CATARACT EXTRACTION Left 2014  . ESOPHAGOGASTRODUODENOSCOPY (EGD) WITH PROPOFOL N/A 09/27/2018   Procedure: ESOPHAGOGASTRODUODENOSCOPY (EGD) WITH PROPOFOL;  Surgeon: Jackquline Denmark, MD;  [facility];  Service: Endoscopy;  Laterality: N/A;  . SHOULDER OPEN ROTATOR CUFF REPAIR  05/22/2011   Procedure:  ROTATOR CUFF REPAIR SHOULDER OPEN;  Surgeon: Tobi Bastos, MD;  Location: [facility];  Service: Orthopedics;  Laterality: Left;  . THORACOTOMY / DECORTICATION PARIETAL  PLEURA Right 204   empyema  . TONSILLECTOMY      Allergies  Allergen Reactions  . Aricept [Donepezil Hcl] Diarrhea  . Crestor [Rosuvastatin Calcium]     Myalgia  . Lipitor [Atorvastatin Calcium]   . Lovastatin     Myalgia  . Micardis [Telmisartan]     Fatigue  . Sertraline Diarrhea  . Welchol [Colesevelam Hcl]     Constipation  . Zetia [Ezetimibe]     Myalgia  . Zocor [Simvastatin]     Myalgia    Allergies as of 05/09/2020      Reactions   Aricept [donepezil Hcl] Diarrhea   Crestor [rosuvastatin Calcium]    Myalgia   Lipitor [atorvastatin Calcium]    Lovastatin    Myalgia   Micardis [telmisartan]    Fatigue   Sertraline Diarrhea   Welchol [colesevelam Hcl]    Constipation   Zetia [ezetimibe]    Myalgia   Zocor [simvastatin]    Myalgia      Medication List       Accurate as of May 09, 2020 11:59 PM. If you have any questions, ask your nurse or doctor.        acetaminophen 325 MG tablet Commonly known as: TYLENOL Take 325 mg by mouth every 6 (six) hours as needed for headache. Take Two Tablets ( 650 ) as needed.   aspirin EC 81 MG tablet Take 81 mg by mouth daily.   Calcium 600-200 MG-UNIT tablet Take 1 tablet by mouth daily. In the morning 7-11 am   cetirizine 10 MG tablet Commonly known as: ZYRTEC Take 10 mg by mouth daily.   chlorhexidine 0.12 % solution Commonly known as: PERIDEX Use as directed 5 mLs in the mouth or throat in the morning. Brush on 1 tsp. of solution to teeth and gums with a toothbrush after AM mouth care. Spit out excess and do not rinse.   escitalopram 10 MG tablet Commonly known as: LEXAPRO Take 1 tablet (10 mg total) by mouth daily.   lactose free nutrition Liqd Take 237 mLs by mouth daily. Give at [facility]   memantine 28 MG Cp24 24 hr capsule Commonly known as: NAMENDA XR TAKE ONE CAPSULE ONCE A DAY TO PRESERVE MEMORY.   metoCLOPramide 5 MG tablet Commonly known as: REGLAN Take 5 mg by mouth in the morning and at  bedtime.   NON FORMULARY Med Pass 2.0 liquid; 2.0; amt: 180ML.; oral Special Instructions: Med Pass 2.0 180ML QD @ 2 PM. Doc. % consumed. Once A Day   omeprazole 20 MG capsule Commonly known as: PRILOSEC Take 20 mg by mouth daily. Do not crush; Open capsule and sprinkle on applesauce   PreserVision AREDS 2 Chew Chew 1 tablet by mouth in the morning and at bedtime.   PreviDent 5000 Booster Plus 1.1 % Pste Generic drug: Sodium Fluoride Place 1 application onto teeth every evening.   rivastigmine 9.5 mg/24hr Commonly known as: EXELON Apply fresh patch daily and remove old patch to help preserve memory   zinc oxide 20 % ointment Apply 1 application topically as needed for irritation. Apply to buttocks/peri topical as needed       Review of Systems  Constitutional: Negative for activity change, appetite change and fever.  HENT: Positive for hearing loss and trouble swallowing. Negative for congestion and voice change.  Eyes: Negative for visual disturbance.  Respiratory: Negative for cough, choking and shortness of breath.   Cardiovascular: Negative for leg swelling.  Gastrointestinal: Negative for abdominal pain and constipation.  Genitourinary: Negative for dysuria and urgency.  Musculoskeletal: Positive for gait problem.  Skin: Negative for color change.  Neurological: Negative for speech difficulty and headaches.       Dementia  Psychiatric/Behavioral: Positive for confusion. Negative for behavioral problems and sleep disturbance. The patient is not nervous/anxious.     Immunization History  Administered Date(s) Administered  . Influenza, High Dose Seasonal PF 01/14/2019, 01/04/2020  . Influenza,inj,Quad PF,6+ Mos 12/29/2017  . Influenza-Unspecified 12/01/2014, 01/11/2016, 01/04/2020  . Moderna Sars-Covid-2 Vaccination 04/05/2019, 05/03/2019, 02/14/2020  . Pneumococcal Conjugate-13 11/22/2013  . Pneumococcal Polysaccharide-23 07/01/2006  . Tdap 12/17/2011  . Zoster  12/25/2005   Pertinent  Health Maintenance Due  Topic Date Due  . OPHTHALMOLOGY EXAM  Never done  . URINE MICROALBUMIN  02/11/2017  . HEMOGLOBIN A1C  12/25/2019  . FOOT EXAM  04/28/2020  . INFLUENZA VACCINE  Completed  . DEXA SCAN  Completed  . PNA vac Low Risk Adult  Completed   Fall Risk  06/17/2018 04/22/2018 01/22/2017 07/23/2016 04/25/2015  Falls in the past year? - 0 No No No  Number falls in past yr: - 0 - - -  Injury with Fall? - 0 - - -  Risk for fall due to : Impaired vision - - - -   Functional Status Survey:    Vitals:   05/09/20 1505  BP: 110/62  Pulse: 68  Resp: 16  Temp: (!) 97.4 F (36.3 C)  SpO2: 96%  Weight: 139 lb 4.8 oz (63.2 kg)  Height: 5' (1.524 m)   Body mass index is 27.21 kg/m. Physical Exam Vitals and nursing note reviewed.  Constitutional:      Appearance: Normal appearance.  HENT:     Head: Normocephalic and atraumatic.     Jaw: No swelling or pain on movement.     Salivary Glands: Right salivary gland is not diffusely enlarged or tender. Left salivary gland is not diffusely enlarged or tender.     Mouth/Throat:     Mouth: Mucous membranes are dry.  Eyes:     Extraocular Movements: Extraocular movements intact.     Conjunctiva/sclera: Conjunctivae normal.     Pupils: Pupils are equal, round, and reactive to light.  Cardiovascular:     Rate and Rhythm: Normal rate and regular rhythm.     Heart sounds: No murmur heard.   Pulmonary:     Effort: Pulmonary effort is normal.     Breath sounds: No rales.  Abdominal:     General: Bowel sounds are normal.     Palpations: Abdomen is soft.     Tenderness: There is no abdominal tenderness.  Musculoskeletal:     Cervical back: Normal range of motion and neck supple.     Right lower leg: No edema.     Left lower leg: No edema.  Skin:    General: Skin is warm and dry.  Neurological:     Mental Status: She is alert. Mental status is at baseline.     Gait: Gait abnormal.     Comments:  Oriented to self.   Psychiatric:     Comments: Pleasantly confused, but able to follow simple directions     Labs reviewed: Recent Labs    02/08/20 0000 02/17/20 0000 04/13/20 0000  NA 139 138 141  K 3.5 4.1  4.7  CL 104 102 102  CO2 27* 30* 27*  BUN 31* 12 11  CREATININE 0.9 0.6 0.7  CALCIUM 9.0 8.4* 9.1   Recent Labs    02/08/20 0000 02/17/20 0000 04/13/20 0000  AST 13 13 15   ALT 10 12 12   ALKPHOS 61 63 68  ALBUMIN 3.8 3.2* 4.0   Recent Labs    02/10/20 0000 02/17/20 0000 04/13/20 0000  WBC 12.7 12.9 9.7  NEUTROABS 8,623.00 8,811.00 6,431.00  HGB 10.9* 10.3* 12.2  HCT 33* 31* 37  PLT 297 320 333   Lab Results  Component Value Date   TSH 3.33 06/24/2019   Lab Results  Component Value Date   HGBA1C 5.9 06/24/2019   Lab Results  Component Value Date   CHOL 275 (H) 04/06/2018   HDL 52 04/06/2018   LDLCALC 190 (H) 04/06/2018   TRIG 161 (H) 04/06/2018   CHOLHDL 5.3 (H) 04/06/2018    Significant Diagnostic Results in last 30 days:  No results found.  Assessment/Plan Dysphagia choking episodes in the past.Takes Reglan. Risk for aspiration.   Vascular dementia without behavioral disturbance (HCC)  takes Memantine 28mg  qd, Rivastigmine 9.5mg /24hr, ASA 81mg  qd  Depression, recurrent (HCC) mood is stable, on Escitalopram 10mg  qd.TSH 3.33 06/24/19   GERD (gastroesophageal reflux disease) stable, on Omeprazole 20mg  qd.Hgb 10.3 11/81/21   Allergic rhinitis Stable, continue Zyrtec 10mg  qd  Fall contributory factors are increased frailty and limited safety awareness  Prediabetes diet controlled, Hgb a1c 5.9 11/24/19. Normal urine micro albumin 11/24/19, had eye exam 09/22/19   Family/ staff Communication: plan of care reviewed with the patient and charge nurse.   Labs/tests ordered:  none  Time spend 35 minutes.

## 2020-05-09 NOTE — Assessment & Plan Note (Signed)
choking episodes in the past.Takes Reglan. Risk for aspiration.

## 2020-05-09 NOTE — Assessment & Plan Note (Signed)
stable, on Omeprazole 20mg  qd.Hgb 10.3 11/81/21

## 2020-05-09 NOTE — Assessment & Plan Note (Signed)
Stable, continue Zyrtec 10mg  qd

## 2020-05-09 NOTE — Assessment & Plan Note (Signed)
contributory factors are increased frailty and limited safety awareness.  

## 2020-05-09 NOTE — Assessment & Plan Note (Signed)
takes Memantine 28mg  qd, Rivastigmine 9.5mg /24hr, ASA 81mg  qd

## 2020-05-09 NOTE — Assessment & Plan Note (Signed)
diet controlled, Hgb a1c 5.9 11/24/19. Normal urine micro albumin 11/24/19, had eye exam 09/22/19 

## 2020-05-10 ENCOUNTER — Encounter: Payer: Self-pay | Admitting: Nurse Practitioner

## 2020-05-15 ENCOUNTER — Non-Acute Institutional Stay (SKILLED_NURSING_FACILITY): Payer: Medicare PPO | Admitting: Nurse Practitioner

## 2020-05-15 ENCOUNTER — Encounter: Payer: Self-pay | Admitting: Nurse Practitioner

## 2020-05-15 DIAGNOSIS — W19XXXA Unspecified fall, initial encounter: Secondary | ICD-10-CM

## 2020-05-15 DIAGNOSIS — R7303 Prediabetes: Secondary | ICD-10-CM

## 2020-05-15 DIAGNOSIS — G301 Alzheimer's disease with late onset: Secondary | ICD-10-CM | POA: Diagnosis not present

## 2020-05-15 DIAGNOSIS — F339 Major depressive disorder, recurrent, unspecified: Secondary | ICD-10-CM | POA: Diagnosis not present

## 2020-05-15 DIAGNOSIS — R0989 Other specified symptoms and signs involving the circulatory and respiratory systems: Secondary | ICD-10-CM

## 2020-05-15 DIAGNOSIS — F0280 Dementia in other diseases classified elsewhere without behavioral disturbance: Secondary | ICD-10-CM

## 2020-05-15 DIAGNOSIS — K219 Gastro-esophageal reflux disease without esophagitis: Secondary | ICD-10-CM

## 2020-05-15 DIAGNOSIS — J3089 Other allergic rhinitis: Secondary | ICD-10-CM

## 2020-05-15 NOTE — Progress Notes (Signed)
This encounter was created in error - please disregard.

## 2020-05-15 NOTE — Assessment & Plan Note (Signed)
takes Zyrtec 10mg qd 

## 2020-05-15 NOTE — Assessment & Plan Note (Signed)
diet controlled, Hgb a1c 5.9 11/24/19. Normal urine micro albumin 11/24/19, had eye exam 09/22/19 

## 2020-05-15 NOTE — Assessment & Plan Note (Signed)
unwitnessed fall 05/14/20, staff reported the patient c/o headache. No pain, HA or focal neurological symptoms noted up my examination today.    Frequent falls, contributory factors are increased frailty and limited safety awareness.  Needs close supervision, assistance for safety

## 2020-05-15 NOTE — Assessment & Plan Note (Signed)
on Escitalopram 10mg  qd.TSH 3.33 06/24/19

## 2020-05-15 NOTE — Assessment & Plan Note (Signed)
stable, on Omeprazole 20mg qd.Hgb12.2 04/13/20 

## 2020-05-15 NOTE — Assessment & Plan Note (Signed)
choking episodes in the past.Takes Reglan. Risk for aspiration.

## 2020-05-15 NOTE — Assessment & Plan Note (Signed)
takes Memantine 28mg  qd, Rivastigmine 9.5mg /24hr, ASA 81mg  qd

## 2020-05-15 NOTE — Progress Notes (Signed)
Location:    [facility] Room Number: 3 Place of Service:  SNF (31) Provider:  Onesimo Lingard, Lennie Odor NP  [name], MD  Patient Care Team: [name], MD as PCP - General (Internal Medicine) Lindwood Coke, MD as Consulting Physician (Dermatology) Christophe Louis, MD as Consulting Physician (Obstetrics and Gynecology) Latanya Maudlin, MD as Consulting Physician (Orthopedic Surgery) Garlan Fair, MD as Consulting Physician (Gastroenterology) Barnett Abu., MD as Consulting Physician (Cardiology) Neldon Mc Donnamarie Poag, MD as Consulting Physician (Allergy and Immunology) Rexene Alberts, MD as Consulting Physician (Cardiothoracic Surgery) Melida Quitter, MD as Consulting Physician (Otolaryngology) Ngetich, Nelda Bucks, NP as Nurse Practitioner (Family Medicine)  Extended Emergency Contact Information Primary Emergency Contact: Kathie Rhodes of [place] Phone: [phone] Mobile Phone: [phone] Relation: Legal Guardian Secondary Emergency Contact: Leana Gamer [place] of [place] Phone: [phone] Relation: Uncle  Code Status:  DNR Goals of care: Advanced Directive information Advanced Directives 04/03/2020  Does Patient Have a Medical Advance Directive? Yes  Type of Paramedic of [place];Out of facility DNR (pink MOST or yellow form)  Does patient want to make changes to medical advance directive? No - Patient declined  Copy of [place] in Chart? Yes - validated most recent copy scanned in chart (See row information)  Pre-existing out of facility DNR order (yellow form or pink MOST form) Pink MOST form placed in chart (order not valid for inpatient use)     Chief Complaint  Patient presents with  . Acute Visit    Fall    HPI:  Pt is a 87 y.o. female seen today for an acute visit for unwitnessed fall 05/14/20, staff reported the patient c/o headache. No pain, HA or focal  neurological symptoms noted up my examination today.    Frequent falls, contributory factors are increased frailty and limited safety awareness.  Dysphagia, choking episodes in the past.Takes Reglan. Risk for aspiration.  Dementia, takes Memantine 28mg  qd, Rivastigmine 9.5mg /24hr, ASA 81mg  qd Her mood is stable, on Escitalopram 10mg  qd.TSH 3.33 06/24/19 GERD, stable, on Omeprazole 20mg  qd.Hgb 12.2 04/13/20 Allergic rhinitis, takes Zyrtec 10mg  qd Prediabetes diet controlled, Hgb a1c 5.9 11/24/19. Normal urine micro albumin 11/24/19, had eye exam 09/22/19    Past Medical History:  Diagnosis Date  . Abnormal CT scan, chest September 2011   Scar tissue  . Allergic rhinitis   . Alzheimer's dementia ([place])   . Asthma   . Diabetes mellitus    borderline and under control-diet ,exercise  . Esophageal stricture   . Family history of colon cancer    Mother  . GERD (gastroesophageal reflux disease)   . Hiatal hernia   . HLD (hyperlipidemia)   . Hypertension   . Osteopenia   . Pneumonia 12-15 yrs.ago   yrs. ago  . TIA (transient ischemic attack)   . Vitamin D deficiency    Past Surgical History:  Procedure Laterality Date  . ABDOMINAL HYSTERECTOMY  1980   . BALLOON DILATION N/A 09/27/2018   Procedure: BALLOON DILATION;  Surgeon: Jackquline Denmark, MD;  [facility];  Service: Endoscopy;  Laterality: N/A;  . BIOPSY  09/27/2018   Procedure: BIOPSY;  Surgeon: Jackquline Denmark, MD;  [facility];  Service: Endoscopy;;  . CATARACT EXTRACTION Left 2014  . ESOPHAGOGASTRODUODENOSCOPY (EGD) WITH PROPOFOL N/A 09/27/2018   Procedure: ESOPHAGOGASTRODUODENOSCOPY (EGD) WITH PROPOFOL;  Surgeon: Jackquline Denmark, MD;  [facility];  Service: Endoscopy;  Laterality: N/A;  .  SHOULDER OPEN ROTATOR CUFF REPAIR  05/22/2011   Procedure: ROTATOR CUFF REPAIR SHOULDER OPEN;  Surgeon: Tobi Bastos, MD;  Location:  [facility];  Service: Orthopedics;  Laterality: Left;  . THORACOTOMY / DECORTICATION PARIETAL PLEURA Right 204   empyema  . TONSILLECTOMY      Allergies  Allergen Reactions  . Aricept [Donepezil Hcl] Diarrhea  . Crestor [Rosuvastatin Calcium]     Myalgia  . Lipitor [Atorvastatin Calcium]   . Lovastatin     Myalgia  . Micardis [Telmisartan]     Fatigue  . Sertraline Diarrhea  . Welchol [Colesevelam Hcl]     Constipation  . Zetia [Ezetimibe]     Myalgia  . Zocor [Simvastatin]     Myalgia    Allergies as of 05/15/2020      Reactions   Aricept [donepezil Hcl] Diarrhea   Crestor [rosuvastatin Calcium]    Myalgia   Lipitor [atorvastatin Calcium]    Lovastatin    Myalgia   Micardis [telmisartan]    Fatigue   Sertraline Diarrhea   Welchol [colesevelam Hcl]    Constipation   Zetia [ezetimibe]    Myalgia   Zocor [simvastatin]    Myalgia      Medication List       Accurate as of May 15, 2020 12:47 PM. If you have any questions, ask your nurse or doctor.        STOP taking these medications   chlorhexidine 0.12 % solution Commonly known as: PERIDEX Stopped by: Sorina Derrig X Kohle Winner, NP     TAKE these medications   acetaminophen 325 MG tablet Commonly known as: TYLENOL Take 325 mg by mouth every 6 (six) hours as needed for headache. Take Two Tablets ( 650 ) as needed.   aspirin EC 81 MG tablet Take 81 mg by mouth daily.   Calcium 600-200 MG-UNIT tablet Take 1 tablet by mouth daily. In the morning 7-11 am   cetirizine 10 MG tablet Commonly known as: ZYRTEC Take 10 mg by mouth daily.   escitalopram 10 MG tablet Commonly known as: LEXAPRO Take 1 tablet (10 mg total) by mouth daily.   lactose free nutrition Liqd Take 237 mLs by mouth daily. Give at [facility]   memantine 28 MG Cp24 24 hr capsule Commonly known as: NAMENDA XR TAKE ONE CAPSULE ONCE A DAY TO PRESERVE MEMORY.   metoCLOPramide 5 MG tablet Commonly known as: REGLAN Take 5 mg by mouth in the morning and at  bedtime.   NON FORMULARY Med Pass 2.0 liquid; 2.0; amt: 180ML.; oral Special Instructions: Med Pass 2.0 180ML QD @ 2 PM. Doc. % consumed. Once A Day   omeprazole 20 MG capsule Commonly known as: PRILOSEC Take 20 mg by mouth daily. Do not crush; Open capsule and sprinkle on applesauce   PreserVision AREDS 2 Chew Chew 1 tablet by mouth in the morning and at bedtime.   PreviDent 5000 Booster Plus 1.1 % Pste Generic drug: Sodium Fluoride Place 1 application onto teeth every evening.   rivastigmine 9.5 mg/24hr Commonly known as: EXELON Apply fresh patch daily and remove old patch to help preserve memory   zinc oxide 20 % ointment Apply 1 application topically as needed for irritation. Apply to buttocks/peri topical as needed       Review of Systems  Constitutional: Negative for activity change, appetite change and fever.  HENT: Positive for hearing loss and trouble swallowing. Negative for congestion and voice change.   Eyes: Negative for visual disturbance.  Respiratory: Negative for cough, choking and shortness of breath.   Cardiovascular: Negative for leg swelling.  Gastrointestinal: Negative for abdominal pain and constipation.  Genitourinary: Negative for dysuria and urgency.  Musculoskeletal: Positive for gait problem.  Skin: Negative for color change.  Neurological: Negative for speech difficulty and headaches.       Dementia  Psychiatric/Behavioral: Positive for confusion. Negative for behavioral problems and sleep disturbance. The patient is not nervous/anxious.     Immunization History  Administered Date(s) Administered  . Influenza, High Dose Seasonal PF 01/14/2019, 01/04/2020  . Influenza,inj,Quad PF,6+ Mos 12/29/2017  . Influenza-Unspecified 12/01/2014, 01/11/2016, 01/04/2020  . Moderna Sars-Covid-2 Vaccination 04/05/2019, 05/03/2019, 02/14/2020  . Pneumococcal Conjugate-13 11/22/2013  . Pneumococcal Polysaccharide-23 07/01/2006  . Tdap 12/17/2011  . Zoster  12/25/2005   Pertinent  Health Maintenance Due  Topic Date Due  . OPHTHALMOLOGY EXAM  Never done  . URINE MICROALBUMIN  02/11/2017  . HEMOGLOBIN A1C  12/25/2019  . FOOT EXAM  04/28/2020  . INFLUENZA VACCINE  Completed  . DEXA SCAN  Completed  . PNA vac Low Risk Adult  Completed   Fall Risk  06/17/2018 04/22/2018 01/22/2017 07/23/2016 04/25/2015  Falls in the past year? - 0 No No No  Number falls in past yr: - 0 - - -  Injury with Fall? - 0 - - -  Risk for fall due to : Impaired vision - - - -   Functional Status Survey:    Vitals:   05/15/20 1007  BP: (!) 158/65  Pulse: (!) 58  Resp: 19  Temp: (!) 97 F (36.1 C)  SpO2: 92%  Weight: 146 lb (66.2 kg)  Height: 5' (1.524 m)   Body mass index is 28.51 kg/m. Physical Exam Vitals and nursing note reviewed.  Constitutional:      Appearance: Normal appearance.  HENT:     Head: Normocephalic and atraumatic.     Jaw: No swelling or pain on movement.     Salivary Glands: Right salivary gland is not diffusely enlarged or tender. Left salivary gland is not diffusely enlarged or tender.     Mouth/Throat:     Mouth: Mucous membranes are moist.  Eyes:     Extraocular Movements: Extraocular movements intact.     Conjunctiva/sclera: Conjunctivae normal.     Pupils: Pupils are equal, round, and reactive to light.  Cardiovascular:     Rate and Rhythm: Normal rate and regular rhythm.     Heart sounds: No murmur heard.   Pulmonary:     Effort: Pulmonary effort is normal.     Breath sounds: No rales.  Abdominal:     General: Bowel sounds are normal.     Palpations: Abdomen is soft.     Tenderness: There is no abdominal tenderness.  Musculoskeletal:     Cervical back: Normal range of motion and neck supple.     Right lower leg: No edema.     Left lower leg: No edema.  Skin:    General: Skin is warm and dry.  Neurological:     Mental Status: She is alert. Mental status is at baseline.     Gait: Gait abnormal.     Comments:  Oriented to self.   Psychiatric:     Comments: Pleasantly confused, but able to follow simple directions     Labs reviewed: Recent Labs    02/08/20 0000 02/17/20 0000 04/13/20 0000  NA 139 138 141  K 3.5 4.1 4.7  CL 104 102 102  CO2 27* 30* 27*  BUN 31* 12 11  CREATININE 0.9 0.6 0.7  CALCIUM 9.0 8.4* 9.1   Recent Labs    02/08/20 0000 02/17/20 0000 04/13/20 0000  AST 13 13 15   ALT 10 12 12   ALKPHOS 61 63 68  ALBUMIN 3.8 3.2* 4.0   Recent Labs    02/10/20 0000 02/17/20 0000 04/13/20 0000  WBC 12.7 12.9 9.7  NEUTROABS 8,623.00 8,811.00 6,431.00  HGB 10.9* 10.3* 12.2  HCT 33* 31* 37  PLT 297 320 333   Lab Results  Component Value Date   TSH 3.33 06/24/2019   Lab Results  Component Value Date   HGBA1C 5.9 06/24/2019   Lab Results  Component Value Date   CHOL 275 (H) 04/06/2018   HDL 52 04/06/2018   LDLCALC 190 (H) 04/06/2018   TRIG 161 (H) 04/06/2018   CHOLHDL 5.3 (H) 04/06/2018    Significant Diagnostic Results in last 30 days:  No results found.  Assessment/Plan Fall  unwitnessed fall 05/14/20, staff reported the patient c/o headache. No pain, HA or focal neurological symptoms noted up my examination today.    Frequent falls, contributory factors are increased frailty and limited safety awareness.  Needs close supervision, assistance for safety    Choking episode choking episodes in the past.Takes Reglan. Risk for aspiration.   Alzheimer's dementia  takes Memantine 28mg  qd, Rivastigmine 9.5mg /24hr, ASA 81mg  qd   Depression, recurrent (HCC) on Escitalopram 10mg  qd.TSH 3.33 06/24/19   GERD (gastroesophageal reflux disease) stable, on Omeprazole 20mg  qd.Hgb 12.2 04/13/20  Allergic rhinitis  takes Zyrtec 10mg  qd   Prediabetes diet controlled, Hgb a1c 5.9 11/24/19. Normal urine micro albumin 11/24/19, had eye exam 09/22/19     Family/ staff Communication: plan of care reviewed with the patient and charge nurse   Labs/tests  ordered:  none  Time spend 35 minutes.

## 2020-05-26 ENCOUNTER — Non-Acute Institutional Stay (SKILLED_NURSING_FACILITY): Payer: Medicare PPO | Admitting: Internal Medicine

## 2020-05-26 ENCOUNTER — Encounter: Payer: Self-pay | Admitting: Internal Medicine

## 2020-05-26 DIAGNOSIS — R059 Cough, unspecified: Secondary | ICD-10-CM | POA: Diagnosis not present

## 2020-05-26 DIAGNOSIS — F0280 Dementia in other diseases classified elsewhere without behavioral disturbance: Secondary | ICD-10-CM

## 2020-05-26 DIAGNOSIS — R0989 Other specified symptoms and signs involving the circulatory and respiratory systems: Secondary | ICD-10-CM

## 2020-05-26 DIAGNOSIS — G301 Alzheimer's disease with late onset: Secondary | ICD-10-CM | POA: Diagnosis not present

## 2020-05-26 DIAGNOSIS — R1319 Other dysphagia: Secondary | ICD-10-CM | POA: Diagnosis not present

## 2020-05-26 DIAGNOSIS — K219 Gastro-esophageal reflux disease without esophagitis: Secondary | ICD-10-CM

## 2020-05-26 NOTE — Progress Notes (Signed)
Location:    [facility] [place] Room Number: 3 Place of Service:  SNF [id number]) Provider:  Veleta Miners MD  [name], MD  Patient Care Team: [name], MD as PCP - General (Internal Medicine) Lindwood Coke, MD as Consulting Physician (Dermatology) Christophe Louis, MD as Consulting Physician (Obstetrics and Gynecology) Latanya Maudlin, MD as Consulting Physician (Orthopedic Surgery) Garlan Fair, MD as Consulting Physician (Gastroenterology) Barnett Abu., MD as Consulting Physician (Cardiology) Neldon Mc, Donnamarie Poag, MD as Consulting Physician (Allergy and Immunology) Rexene Alberts, MD as Consulting Physician (Cardiothoracic Surgery) Melida Quitter, MD as Consulting Physician (Otolaryngology) Ngetich, Nelda Bucks, NP as Nurse Practitioner (Family Medicine)  Extended Emergency Contact Information Primary Emergency Contact: Kathie Rhodes of [place] Phone: [phone] Mobile Phone: [phone] Relation: Legal Guardian Secondary Emergency Contact: Leana Gamer [place] of [place] Phone: [phone] Relation: Uncle  Code Status:  DNR Managed Care Goals of care: Advanced Directive information Advanced Directives 04/03/2020  Does Patient Have a Medical Advance Directive? Yes  Type of Paramedic of [place];Out of facility DNR (pink MOST or yellow form)  Does patient want to make changes to medical advance directive? No - Patient declined  Copy of [place] in Chart? Yes - validated most recent copy scanned in chart (See row information)  Pre-existing out of facility DNR order (yellow form or pink MOST form) Pink MOST form placed in chart (order not valid for inpatient use)     Chief Complaint  Patient presents with  . Acute Visit    Cough and Aspiration     HPI:  Pt is a 87 y.o. female seen today for an acute visit for Cough Ans Aspiration Episode  Patient has h/o  Hyperlipidemia,  Osteoporosis, Allergic Rhinitis and  Dementia Alzheimer, Dysphagia  s/p Dilatation 6/20Has h/o Choking Episodes on Reglan now   Called to see patient as patient had an episode of choking again.  She just finished her lunch and then was constantly coughing. Her vitals were stable. Discussed with the nurses and patient usually does not let them do  nebulizers.  We tried some Robitussin and tried to calm the patient.  She did not have any wheezing on exam.  Checked on her after 25- 30 minutes.  And patient had calm down and stopped coughing.  No Rales on exam.  Past Medical History:  Diagnosis Date  . Abnormal CT scan, chest September 2011   Scar tissue  . Allergic rhinitis   . Alzheimer's dementia ([place])   . Asthma   . Diabetes mellitus    borderline and under control-diet ,exercise  . Esophageal stricture   . Family history of colon cancer    Mother  . GERD (gastroesophageal reflux disease)   . Hiatal hernia   . HLD (hyperlipidemia)   . Hypertension   . Osteopenia   . Pneumonia 12-15 yrs.ago   yrs. ago  . TIA (transient ischemic attack)   . Vitamin D deficiency    Past Surgical History:  Procedure Laterality Date  . ABDOMINAL HYSTERECTOMY  1980   . BALLOON DILATION N/A 09/27/2018   Procedure: BALLOON DILATION;  Surgeon: Jackquline Denmark, MD;  [facility]: [facility];  Service: Endoscopy;  Laterality: N/A;  . BIOPSY  09/27/2018   Procedure: BIOPSY;  Surgeon: Jackquline Denmark, MD;  [facility]: [facility];  Service: Endoscopy;;  . CATARACT EXTRACTION Left 2014  . ESOPHAGOGASTRODUODENOSCOPY (EGD) WITH PROPOFOL N/A 09/27/2018   Procedure: ESOPHAGOGASTRODUODENOSCOPY (  EGD) WITH PROPOFOL;  Surgeon: Jackquline Denmark, MD;  [facility];  Service: Endoscopy;  Laterality: N/A;  . SHOULDER OPEN ROTATOR CUFF REPAIR  05/22/2011   Procedure: ROTATOR CUFF REPAIR SHOULDER OPEN;  Surgeon: Tobi Bastos, MD;  Location: [facility];  Service: Orthopedics;  Laterality: Left;  .  THORACOTOMY / DECORTICATION PARIETAL PLEURA Right 204   empyema  . TONSILLECTOMY      Allergies  Allergen Reactions  . Aricept [Donepezil Hcl] Diarrhea  . Crestor [Rosuvastatin Calcium]     Myalgia  . Lipitor [Atorvastatin Calcium]   . Lovastatin     Myalgia  . Micardis [Telmisartan]     Fatigue  . Sertraline Diarrhea  . Welchol [Colesevelam Hcl]     Constipation  . Zetia [Ezetimibe]     Myalgia  . Zocor [Simvastatin]     Myalgia    Allergies as of 05/26/2020      Reactions   Aricept [donepezil Hcl] Diarrhea   Crestor [rosuvastatin Calcium]    Myalgia   Lipitor [atorvastatin Calcium]    Lovastatin    Myalgia   Micardis [telmisartan]    Fatigue   Sertraline Diarrhea   Welchol [colesevelam Hcl]    Constipation   Zetia [ezetimibe]    Myalgia   Zocor [simvastatin]    Myalgia      Medication List       Accurate as of May 26, 2020  3:12 PM. If you have any questions, ask your nurse or doctor.        acetaminophen 325 MG tablet Commonly known as: TYLENOL Take 325 mg by mouth every 6 (six) hours as needed for headache. Take Two Tablets ( 650 ) as needed.   aspirin EC 81 MG tablet Take 81 mg by mouth daily.   Calcium 600-200 MG-UNIT tablet Take 1 tablet by mouth daily. In the morning 7-11 am   cetirizine 10 MG tablet Commonly known as: ZYRTEC Take 10 mg by mouth daily.   escitalopram 10 MG tablet Commonly known as: LEXAPRO Take 1 tablet (10 mg total) by mouth daily.   lactose free nutrition Liqd Take 237 mLs by mouth daily. Give at [facility]   memantine 28 MG Cp24 24 hr capsule Commonly known as: NAMENDA XR TAKE ONE CAPSULE ONCE A DAY TO PRESERVE MEMORY.   metoCLOPramide 5 MG tablet Commonly known as: REGLAN Take 5 mg by mouth in the morning and at bedtime.   NON FORMULARY Med Pass 2.0 liquid; 2.0; amt: 180ML.; oral Special Instructions: Med Pass 2.0 180ML QD @ 2 PM. Doc. % consumed. Once A Day   omeprazole 20 MG capsule Commonly known as:  PRILOSEC Take 20 mg by mouth daily. Do not crush; Open capsule and sprinkle on applesauce   PreserVision AREDS 2 Chew Chew 1 tablet by mouth in the morning and at bedtime.   PreviDent 5000 Booster Plus 1.1 % Pste Generic drug: Sodium Fluoride Place 1 application onto teeth every evening.   rivastigmine 9.5 mg/24hr Commonly known as: EXELON Apply fresh patch daily and remove old patch to help preserve memory   zinc oxide 20 % ointment Apply 1 application topically as needed for irritation. Apply to buttocks/peri topical as needed       Review of Systems  Unable to perform ROS: Dementia    Immunization History  Administered Date(s) Administered  . Influenza, High Dose Seasonal PF 01/14/2019, 01/04/2020  . Influenza,inj,Quad PF,6+ Mos 12/29/2017  . Influenza-Unspecified 12/01/2014, 01/11/2016, 01/04/2020  . [place] Sars-Covid-2 Vaccination  04/05/2019, 05/03/2019, 02/14/2020  . Pneumococcal Conjugate-13 11/22/2013  . Pneumococcal Polysaccharide-23 07/01/2006  . Tdap 12/17/2011  . Zoster 12/25/2005   Pertinent  Health Maintenance Due  Topic Date Due  . OPHTHALMOLOGY EXAM  Never done  . URINE MICROALBUMIN  02/11/2017  . HEMOGLOBIN A1C  12/25/2019  . FOOT EXAM  04/28/2020  . INFLUENZA VACCINE  Completed  . DEXA SCAN  Completed  . PNA vac Low Risk Adult  Completed   Fall Risk  06/17/2018 04/22/2018 01/22/2017 07/23/2016 04/25/2015  Falls in the past year? - 0 No No No  Number falls in past yr: - 0 - - -  Injury with Fall? - 0 - - -  Risk for fall due to : Impaired vision - - - -   Functional Status Survey:    Vitals:   05/26/20 1504  BP: 131/75  Pulse: 66  Resp: 20  Temp: 98.2 F (36.8 C)  SpO2: 94%  Weight: 146 lb (66.2 kg)  Height: 5' (1.524 m)   Body mass index is 28.51 kg/m. Physical Exam  Constitutional:  Well-developed and well-nourished.  HENT:  Head: Normocephalic.  Mouth/Throat: Oropharynx is clear and moist.  Eyes: Pupils are equal, round, and  reactive to light.  Neck: Neck supple.  Cardiovascular: Normal rate and normal heart sounds.  No murmur heard. Pulmonary/Chest: Effort normal and breath sounds normal. No respiratory distress. No wheezes. She has no rales.  Abdominal: Soft. Bowel sounds are normal. No distension. There is no tenderness. There is no rebound.  Musculoskeletal: No edema.  Lymphadenopathy: none Neurological: No Focal Deficits Walking  Skin: Skin is warm and dry.  Psychiatric: Normal mood and affect. Behavior is normal. Thought content normal.    Labs reviewed: Recent Labs    02/08/20 0000 02/17/20 0000 04/13/20 0000  NA 139 138 141  K 3.5 4.1 4.7  CL 104 102 102  CO2 27* 30* 27*  BUN 31* 12 11  CREATININE 0.9 0.6 0.7  CALCIUM 9.0 8.4* 9.1   Recent Labs    02/08/20 0000 02/17/20 0000 04/13/20 0000  AST 13 13 15   ALT 10 12 12   ALKPHOS 61 63 68  ALBUMIN 3.8 3.2* 4.0   Recent Labs    02/10/20 0000 02/17/20 0000 04/13/20 0000  WBC 12.7 12.9 9.7  NEUTROABS 8,623.00 8,811.00 6,431.00  HGB 10.9* 10.3* 12.2  HCT 33* 31* 37  PLT 297 320 333   Lab Results  Component Value Date   TSH 3.33 06/24/2019   Lab Results  Component Value Date   HGBA1C 5.9 06/24/2019   Lab Results  Component Value Date   CHOL 275 (H) 04/06/2018   HDL 52 04/06/2018   LDLCALC 190 (H) 04/06/2018   TRIG 161 (H) 04/06/2018   CHOLHDL 5.3 (H) 04/06/2018    Significant Diagnostic Results in last 30 days:  No results found.  Assessment/Plan . Cough with Choking D/w Nurse Will Continue to monitor If develop fever or repeat Cough will consider Chest Xray  Esophageal dysphagia On Reglan  Late onset Alzheimer's dementia without behavioral disturbance (HCC) Continue Exelon and Nemanda Gastroesophageal reflux disease, unspecified whether esophagitis present On Prilosec   Family/ staff Communication:   Labs/tests ordered:

## 2020-05-31 ENCOUNTER — Non-Acute Institutional Stay (SKILLED_NURSING_FACILITY): Payer: Medicare PPO | Admitting: Internal Medicine

## 2020-05-31 ENCOUNTER — Encounter: Payer: Self-pay | Admitting: Internal Medicine

## 2020-05-31 DIAGNOSIS — R1319 Other dysphagia: Secondary | ICD-10-CM

## 2020-05-31 DIAGNOSIS — G301 Alzheimer's disease with late onset: Secondary | ICD-10-CM

## 2020-05-31 DIAGNOSIS — K219 Gastro-esophageal reflux disease without esophagitis: Secondary | ICD-10-CM | POA: Diagnosis not present

## 2020-05-31 DIAGNOSIS — F0280 Dementia in other diseases classified elsewhere without behavioral disturbance: Secondary | ICD-10-CM

## 2020-05-31 DIAGNOSIS — F339 Major depressive disorder, recurrent, unspecified: Secondary | ICD-10-CM

## 2020-05-31 NOTE — Progress Notes (Signed)
Location:    [facility] Room Number: 3 Place of Service:  SNF [phone]) Provider:  Veleta Miners MD  [name], MD  Patient Care Team: [name], MD as PCP - General (Internal Medicine) Lindwood Coke, MD as Consulting Physician (Dermatology) Christophe Louis, MD as Consulting Physician (Obstetrics and Gynecology) Latanya Maudlin, MD as Consulting Physician (Orthopedic Surgery) Garlan Fair, MD as Consulting Physician (Gastroenterology) Barnett Abu., MD as Consulting Physician (Cardiology) Neldon Mc, Donnamarie Poag, MD as Consulting Physician (Allergy and Immunology) Rexene Alberts, MD as Consulting Physician (Cardiothoracic Surgery) Melida Quitter, MD as Consulting Physician (Otolaryngology) Ngetich, Nelda Bucks, NP as Nurse Practitioner (Family Medicine)  Extended Emergency Contact Information Primary Emergency Contact: Kathie Rhodes of [place] Phone: [phone] Mobile Phone: [phone] Relation: Legal Guardian Secondary Emergency Contact: Leana Gamer [place] of [place] Phone: [phone] Relation: Uncle  Code Status:  DNR Managed Care Goals of care: Advanced Directive information Advanced Directives 04/03/2020  Does Patient Have a Medical Advance Directive? Yes  Type of Paramedic of [place];Out of facility DNR (pink MOST or yellow form)  Does patient want to make changes to medical advance directive? No - Patient declined  Copy of [place] in Chart? Yes - validated most recent copy scanned in chart (See row information)  Pre-existing out of facility DNR order (yellow form or pink MOST form) Pink MOST form placed in chart (order not valid for inpatient use)     Chief Complaint  Patient presents with  . Medical Management of Chronic Issues    HPI:  Pt is a 87 y.o. female seen today for medical management of chronic diseases.    Patient has h/o Hyperlipidemia,   Osteoporosis, Allergic Rhinitis and  Dementia Alzheimer, Dysphagia s/p Dilatation 6/20Has h/o Choking Episodes on Reglan now  Has episodes of Choking sometimes Speech has worked with her   She is stable otherwise Some behavior issues in the evening but usually responds to supportive therapy Walks with no assists No Falls Weight stable   Past Medical History:  Diagnosis Date  . Abnormal CT scan, chest September 2011   Scar tissue  . Allergic rhinitis   . Alzheimer's dementia ([place])   . Asthma   . Diabetes mellitus    borderline and under control-diet ,exercise  . Esophageal stricture   . Family history of colon cancer    Mother  . GERD (gastroesophageal reflux disease)   . Hiatal hernia   . HLD (hyperlipidemia)   . Hypertension   . Osteopenia   . Pneumonia 12-15 yrs.ago   yrs. ago  . TIA (transient ischemic attack)   . Vitamin D deficiency    Past Surgical History:  Procedure Laterality Date  . ABDOMINAL HYSTERECTOMY  1980   . BALLOON DILATION N/A 09/27/2018   Procedure: BALLOON DILATION;  Surgeon: Jackquline Denmark, MD;  [facility];  Service: Endoscopy;  Laterality: N/A;  . BIOPSY  09/27/2018   Procedure: BIOPSY;  Surgeon: Jackquline Denmark, MD;  [facility];  Service: Endoscopy;;  . CATARACT EXTRACTION Left 2014  . ESOPHAGOGASTRODUODENOSCOPY (EGD) WITH PROPOFOL N/A 09/27/2018   Procedure: ESOPHAGOGASTRODUODENOSCOPY (EGD) WITH PROPOFOL;  Surgeon: Jackquline Denmark, MD;  [facility];  Service: Endoscopy;  Laterality: N/A;  . SHOULDER OPEN ROTATOR CUFF REPAIR  05/22/2011   Procedure: ROTATOR CUFF REPAIR SHOULDER OPEN;  Surgeon: Tobi Bastos, MD;  [facility];  Service: Orthopedics;  Laterality: Left;  .  THORACOTOMY / DECORTICATION PARIETAL PLEURA Right 204   empyema  . TONSILLECTOMY      Allergies  Allergen Reactions  . Aricept [Donepezil Hcl] Diarrhea  . Crestor [Rosuvastatin Calcium]     Myalgia  . Lipitor [Atorvastatin Calcium]    . Lovastatin     Myalgia  . Micardis [Telmisartan]     Fatigue  . Sertraline Diarrhea  . Welchol [Colesevelam Hcl]     Constipation  . Zetia [Ezetimibe]     Myalgia  . Zocor [Simvastatin]     Myalgia    Allergies as of 05/31/2020      Reactions   Aricept [donepezil Hcl] Diarrhea   Crestor [rosuvastatin Calcium]    Myalgia   Lipitor [atorvastatin Calcium]    Lovastatin    Myalgia   Micardis [telmisartan]    Fatigue   Sertraline Diarrhea   Welchol [colesevelam Hcl]    Constipation   Zetia [ezetimibe]    Myalgia   Zocor [simvastatin]    Myalgia      Medication List       Accurate as of May 31, 2020  2:52 PM. If you have any questions, ask your nurse or doctor.        acetaminophen 325 MG tablet Commonly known as: TYLENOL Take 325 mg by mouth every 6 (six) hours as needed for headache. Take Two Tablets ( 650 ) as needed.   aspirin EC 81 MG tablet Take 81 mg by mouth daily.   Calcium 600-200 MG-UNIT tablet Take 1 tablet by mouth daily. In the morning 7-11 am   cetirizine 10 MG tablet Commonly known as: ZYRTEC Take 10 mg by mouth daily.   escitalopram 10 MG tablet Commonly known as: LEXAPRO Take 1 tablet (10 mg total) by mouth daily.   lactose free nutrition Liqd Take 237 mLs by mouth daily. Give at [facility]   memantine 28 MG Cp24 24 hr capsule Commonly known as: NAMENDA XR TAKE ONE CAPSULE ONCE A DAY TO PRESERVE MEMORY.   metoCLOPramide 5 MG tablet Commonly known as: REGLAN Take 5 mg by mouth in the morning and at bedtime.   NON FORMULARY Med Pass 2.0 liquid; 2.0; amt: 180ML.; oral Special Instructions: Med Pass 2.0 180ML QD @ 2 PM. Doc. % consumed. Once A Day   omeprazole 20 MG capsule Commonly known as: PRILOSEC Take 20 mg by mouth daily. Do not crush; Open capsule and sprinkle on applesauce   PreserVision AREDS 2 Chew Chew 1 tablet by mouth in the morning and at bedtime.   PreviDent 5000 Booster Plus 1.1 % Pste Generic drug: Sodium  Fluoride Place 1 application onto teeth every evening.   rivastigmine 9.5 mg/24hr Commonly known as: EXELON Apply fresh patch daily and remove old patch to help preserve memory   zinc oxide 20 % ointment Apply 1 application topically as needed for irritation. Apply to buttocks/peri topical as needed       Review of Systems  Unable to perform ROS: Dementia    Immunization History  Administered Date(s) Administered  . Influenza, High Dose Seasonal PF 01/14/2019, 01/04/2020  . Influenza,inj,Quad PF,6+ Mos 12/29/2017  . Influenza-Unspecified 12/01/2014, 01/11/2016, 01/04/2020  . [place] Sars-Covid-2 Vaccination 04/05/2019, 05/03/2019, 02/14/2020  . Pneumococcal Conjugate-13 11/22/2013  . Pneumococcal Polysaccharide-23 07/01/2006  . Tdap 12/17/2011  . Zoster 12/25/2005   Pertinent  Health Maintenance Due  Topic Date Due  . OPHTHALMOLOGY EXAM  Never done  . URINE MICROALBUMIN  02/11/2017  . HEMOGLOBIN A1C  12/25/2019  .  FOOT EXAM  04/28/2020  . INFLUENZA VACCINE  Completed  . DEXA SCAN  Completed  . PNA vac Low Risk Adult  Completed   Fall Risk  06/17/2018 04/22/2018 01/22/2017 07/23/2016 04/25/2015  Falls in the past year? - 0 No No No  Number falls in past yr: - 0 - - -  Injury with Fall? - 0 - - -  Risk for fall due to : Impaired vision - - - -   Functional Status Survey:    Vitals:   05/31/20 1449  BP: (!) 145/71  Pulse: 67  Resp: 18  Temp: (!) 97.3 F (36.3 C)  SpO2: 91%  Weight: 142 lb 12.8 oz (64.8 kg)  Height: 5' (1.524 m)   Body mass index is 27.89 kg/m. Physical Exam  Constitutional: . Well-developed and well-nourished.  HENT:  Head: Normocephalic.  Mouth/Throat: Oropharynx is clear and moist.  Eyes: Pupils are equal, round, and reactive to light.  Neck: Neck supple.  Cardiovascular: Normal rate and normal heart sounds.  No murmur heard. Pulmonary/Chest: Effort normal and breath sounds normal. No respiratory distress. No wheezes. She has no rales.   Abdominal: Soft. Bowel sounds are normal. No distension. There is no tenderness. There is no rebound.  Musculoskeletal: No edema.  Lymphadenopathy: none Neurological: No Focal Deficits Has Aphasia and oriented to herself  Skin: Skin is warm and dry.  Psychiatric: Normal mood and affect. Behavior is normal. Thought content normal.    Labs reviewed: Recent Labs    02/08/20 0000 02/17/20 0000 04/13/20 0000  NA 139 138 141  K 3.5 4.1 4.7  CL 104 102 102  CO2 27* 30* 27*  BUN 31* 12 11  CREATININE 0.9 0.6 0.7  CALCIUM 9.0 8.4* 9.1   Recent Labs    02/08/20 0000 02/17/20 0000 04/13/20 0000  AST 13 13 15   ALT 10 12 12   ALKPHOS 61 63 68  ALBUMIN 3.8 3.2* 4.0   Recent Labs    02/10/20 0000 02/17/20 0000 04/13/20 0000  WBC 12.7 12.9 9.7  NEUTROABS 8,623.00 8,811.00 6,431.00  HGB 10.9* 10.3* 12.2  HCT 33* 31* 37  PLT 297 320 333   Lab Results  Component Value Date   TSH 3.33 06/24/2019   Lab Results  Component Value Date   HGBA1C 5.9 06/24/2019   Lab Results  Component Value Date   CHOL 275 (H) 04/06/2018   HDL 52 04/06/2018   LDLCALC 190 (H) 04/06/2018   TRIG 161 (H) 04/06/2018   CHOLHDL 5.3 (H) 04/06/2018    Significant Diagnostic Results in last 30 days:  No results found.  Assessment/Plan  Esophageal dysphagia Continue on Reglan  Late onset Alzheimer's dementia without behavioral disturbance (HCC) On Exelon and Namenda Gastroesophageal reflux disease, unspecified whether esophagitis present On Prilosec Depression, recurrent ([place]) Continue Lexapro   Family/ staff Communication:   Labs/tests ordered:

## 2020-06-30 ENCOUNTER — Encounter: Payer: Self-pay | Admitting: Nurse Practitioner

## 2020-06-30 ENCOUNTER — Non-Acute Institutional Stay (SKILLED_NURSING_FACILITY): Payer: Medicare PPO | Admitting: Nurse Practitioner

## 2020-06-30 DIAGNOSIS — R1319 Other dysphagia: Secondary | ICD-10-CM

## 2020-06-30 DIAGNOSIS — K219 Gastro-esophageal reflux disease without esophagitis: Secondary | ICD-10-CM

## 2020-06-30 DIAGNOSIS — F339 Major depressive disorder, recurrent, unspecified: Secondary | ICD-10-CM | POA: Diagnosis not present

## 2020-06-30 DIAGNOSIS — R296 Repeated falls: Secondary | ICD-10-CM

## 2020-06-30 DIAGNOSIS — J3089 Other allergic rhinitis: Secondary | ICD-10-CM

## 2020-06-30 DIAGNOSIS — F0150 Vascular dementia without behavioral disturbance: Secondary | ICD-10-CM

## 2020-06-30 DIAGNOSIS — W19XXXA Unspecified fall, initial encounter: Secondary | ICD-10-CM

## 2020-06-30 DIAGNOSIS — R7303 Prediabetes: Secondary | ICD-10-CM

## 2020-06-30 NOTE — Assessment & Plan Note (Signed)
Frequent falls, contributory factors are increased frailty and limited safety awareness.

## 2020-06-30 NOTE — Assessment & Plan Note (Signed)
mood is stable, on Escitalopram 10mg  qd.TSH 3.33 06/24/19

## 2020-06-30 NOTE — Assessment & Plan Note (Signed)
diet controlled, Hgb a1c 5.9 11/24/19. Normal urine micro albumin 11/24/19, had eye exam 09/22/19

## 2020-06-30 NOTE — Assessment & Plan Note (Signed)
stable, on Omeprazole 20mg  qd.Hgb12.2 04/13/20

## 2020-06-30 NOTE — Assessment & Plan Note (Signed)
takes Memantine 28mg  qd, Rivastigmine 9.5mg /24hr, ASA 81mg  qd, progressing, needs to be fed.

## 2020-06-30 NOTE — Assessment & Plan Note (Signed)
takes Zyrtec 10mg  qd

## 2020-06-30 NOTE — Progress Notes (Signed)
Location:    [facility] Room Number: 3 Place of Service:  SNF (31) Provider: Lennie Odor Valentin Benney NP  [name], MD  Patient Care Team: [name], MD as PCP - General (Internal Medicine) Lindwood Coke, MD as Consulting Physician (Dermatology) Christophe Louis, MD as Consulting Physician (Obstetrics and Gynecology) Latanya Maudlin, MD as Consulting Physician (Orthopedic Surgery) Garlan Fair, MD as Consulting Physician (Gastroenterology) Barnett Abu., MD as Consulting Physician (Cardiology) Neldon Mc Donnamarie Poag, MD as Consulting Physician (Allergy and Immunology) Rexene Alberts, MD as Consulting Physician (Cardiothoracic Surgery) Melida Quitter, MD as Consulting Physician (Otolaryngology) Ngetich, Nelda Bucks, NP as Nurse Practitioner (Family Medicine)  Extended Emergency Contact Information Primary Emergency Contact: Kathie Rhodes of [place] Phone: [phone] Mobile Phone: [phone] Relation: Legal Guardian Secondary Emergency Contact: Leana Gamer States of [place] Phone: [phone] Relation: Uncle  Code Status:  DNR Goals of care: Advanced Directive information Advanced Directives 04/03/2020  Does Patient Have a Medical Advance Directive? Yes  Type of Paramedic of [place];Out of facility DNR (pink MOST or yellow form)  Does patient want to make changes to medical advance directive? No - Patient declined  Copy of [place] in Chart? Yes - validated most recent copy scanned in chart (See row information)  Pre-existing out of facility DNR order (yellow form or pink MOST form) Pink MOST form placed in chart (order not valid for inpatient use)     Chief Complaint  Patient presents with  . Medical Management of Chronic Issues  . Health Maintenance    Foot and eye exam, hemoglobin A1C, urine microalbumin    HPI:  Pt is a 87 y.o. female seen today for medical  management of chronic diseases.       Frequent falls, contributory factors are increased frailty and limited safety awareness.             Dysphagia, choking episodes in the past.Takes Reglan. Risk for aspiration.Pureed diet.  Dementia, takes Memantine 28mg  qd, Rivastigmine 9.5mg /24hr, ASA 81mg  qd, progressing, needs to be fed.  Her mood is stable, on Escitalopram 10mg  qd.TSH 3.33 06/24/19 GERD, stable, on Omeprazole 20mg  qd.Hgb12.2 04/13/20 Allergic rhinitis, takes Zyrtec 10mg  qd Prediabetes diet controlled, Hgb a1c 5.9 11/24/19. Normal urine micro albumin 11/24/19, had eye exam 09/22/19   Past Medical History:  Diagnosis Date  . Abnormal CT scan, chest September 2011   Scar tissue  . Allergic rhinitis   . Alzheimer's dementia ([place])   . Asthma   . Diabetes mellitus    borderline and under control-diet ,exercise  . Esophageal stricture   . Family history of colon cancer    Mother  . GERD (gastroesophageal reflux disease)   . Hiatal hernia   . HLD (hyperlipidemia)   . Hypertension   . Osteopenia   . Pneumonia 12-15 yrs.ago   yrs. ago  . TIA (transient ischemic attack)   . Vitamin D deficiency    Past Surgical History:  Procedure Laterality Date  . ABDOMINAL HYSTERECTOMY  1980   . BALLOON DILATION N/A 09/27/2018   Procedure: BALLOON DILATION;  Surgeon: Jackquline Denmark, MD;  [facility];  Service: Endoscopy;  Laterality: N/A;  . BIOPSY  09/27/2018   Procedure: BIOPSY;  Surgeon: Jackquline Denmark, MD;  Location: [facility];  Service: Endoscopy;;  . CATARACT EXTRACTION Left 2014  . ESOPHAGOGASTRODUODENOSCOPY (EGD) WITH PROPOFOL N/A 09/27/2018   Procedure: ESOPHAGOGASTRODUODENOSCOPY (EGD) WITH PROPOFOL;  Surgeon: Jackquline Denmark,  MD;  [facility];  Service: Endoscopy;  Laterality: N/A;  . SHOULDER OPEN ROTATOR CUFF REPAIR  05/22/2011   Procedure: ROTATOR CUFF REPAIR SHOULDER OPEN;   Surgeon: Tobi Bastos, MD;  [facility]: [facility];  Service: Orthopedics;  Laterality: Left;  . THORACOTOMY / DECORTICATION PARIETAL PLEURA Right 204   empyema  . TONSILLECTOMY      Allergies  Allergen Reactions  . Aricept [Donepezil Hcl] Diarrhea  . Crestor [Rosuvastatin Calcium]     Myalgia  . Lipitor [Atorvastatin Calcium]   . Lovastatin     Myalgia  . Micardis [Telmisartan]     Fatigue  . Sertraline Diarrhea  . Welchol [Colesevelam Hcl]     Constipation  . Zetia [Ezetimibe]     Myalgia  . Zocor [Simvastatin]     Myalgia    Allergies as of 06/30/2020      Reactions   Aricept [donepezil Hcl] Diarrhea   Crestor [rosuvastatin Calcium]    Myalgia   Lipitor [atorvastatin Calcium]    Lovastatin    Myalgia   Micardis [telmisartan]    Fatigue   Sertraline Diarrhea   Welchol [colesevelam Hcl]    Constipation   Zetia [ezetimibe]    Myalgia   Zocor [simvastatin]    Myalgia      Medication List       Accurate as of June 30, 2020 11:59 PM. If you have any questions, ask your nurse or doctor.        acetaminophen 325 MG tablet Commonly known as: TYLENOL Take 325 mg by mouth every 6 (six) hours as needed for headache. Take Two Tablets ( 650 ) as needed.   aspirin EC 81 MG tablet Take 81 mg by mouth daily.   Calcium 600-200 MG-UNIT tablet Take 1 tablet by mouth daily. In the morning 7-11 am   cetirizine 10 MG tablet Commonly known as: ZYRTEC Take 10 mg by mouth daily.   escitalopram 10 MG tablet Commonly known as: LEXAPRO Take 1 tablet (10 mg total) by mouth daily.   lactose free nutrition Liqd Take 237 mLs by mouth daily. Give at [facility]   memantine 28 MG Cp24 24 hr capsule Commonly known as: NAMENDA XR TAKE ONE CAPSULE ONCE A DAY TO PRESERVE MEMORY.   metoCLOPramide 5 MG tablet Commonly known as: REGLAN Take 5 mg by mouth in the morning and at bedtime.   NON FORMULARY Med Pass 2.0 liquid; 2.0; amt: 180ML.; oral Special Instructions: Med Pass 2.0 180ML  QD @ 2 PM. Doc. % consumed. Once A Day   omeprazole 20 MG capsule Commonly known as: PRILOSEC Take 20 mg by mouth daily. Do not crush; Open capsule and sprinkle on applesauce   PreserVision AREDS 2 Chew Chew 1 tablet by mouth in the morning and at bedtime.   PreviDent 5000 Booster Plus 1.1 % Pste Generic drug: Sodium Fluoride Place 1 application onto teeth every evening.   rivastigmine 9.5 mg/24hr Commonly known as: EXELON Apply fresh patch daily and remove old patch to help preserve memory   zinc oxide 20 % ointment Apply 1 application topically as needed for irritation. Apply to buttocks/peri topical as needed       Review of Systems  Constitutional: Negative for fatigue, fever and unexpected weight change.  HENT: Positive for hearing loss and trouble swallowing. Negative for congestion and voice change.   Eyes: Negative for visual disturbance.  Respiratory: Negative for cough.   Cardiovascular: Negative for leg swelling.  Gastrointestinal: Negative for abdominal  pain and constipation.  Genitourinary: Negative for dysuria and urgency.  Musculoskeletal: Positive for gait problem.  Skin: Negative for color change.  Neurological: Positive for speech difficulty. Negative for headaches.       Dementia, expressive aphasia.   Psychiatric/Behavioral: Positive for confusion. Negative for behavioral problems and sleep disturbance. The patient is not nervous/anxious.     Immunization History  Administered Date(s) Administered  . Influenza, High Dose Seasonal PF 01/14/2019, 01/04/2020  . Influenza,inj,Quad PF,6+ Mos 12/29/2017  . Influenza-Unspecified 12/01/2014, 01/11/2016, 01/04/2020  . Moderna Sars-Covid-2 Vaccination 04/05/2019, 05/03/2019, 02/14/2020  . Pneumococcal Conjugate-13 11/22/2013  . Pneumococcal Polysaccharide-23 07/01/2006  . Tdap 12/17/2011  . Zoster 12/25/2005   Pertinent  Health Maintenance Due  Topic Date Due  . OPHTHALMOLOGY EXAM  Never done  . URINE  MICROALBUMIN  02/11/2017  . HEMOGLOBIN A1C  12/25/2019  . FOOT EXAM  04/28/2020  . INFLUENZA VACCINE  10/30/2020  . DEXA SCAN  Completed  . PNA vac Low Risk Adult  Completed   Fall Risk  06/17/2018 04/22/2018 01/22/2017 07/23/2016 04/25/2015  Falls in the past year? - 0 No No No  Number falls in past yr: - 0 - - -  Injury with Fall? - 0 - - -  Risk for fall due to : Impaired vision - - - -   Functional Status Survey:    Vitals:   06/30/20 1632  BP: 122/68  Pulse: 60  Resp: 18  Temp: (!) 96.8 F (36 C)  SpO2: 96%  Weight: 141 lb 9.6 oz (64.2 kg)  Height: 5' (1.524 m)   Body mass index is 27.65 kg/m. Physical Exam Vitals and nursing note reviewed.  Constitutional:      Appearance: Normal appearance.  HENT:     Head: Normocephalic and atraumatic.     Jaw: No swelling or pain on movement.     Salivary Glands: Right salivary gland is not diffusely enlarged or tender. Left salivary gland is not diffusely enlarged or tender.     Mouth/Throat:     Mouth: Mucous membranes are moist.  Eyes:     Extraocular Movements: Extraocular movements intact.     Conjunctiva/sclera: Conjunctivae normal.     Pupils: Pupils are equal, round, and reactive to light.  Cardiovascular:     Rate and Rhythm: Normal rate and regular rhythm.     Heart sounds: No murmur heard.   Pulmonary:     Effort: Pulmonary effort is normal.     Breath sounds: No rales.  Abdominal:     General: Bowel sounds are normal.     Palpations: Abdomen is soft.     Tenderness: There is no abdominal tenderness.  Musculoskeletal:     Cervical back: Normal range of motion and neck supple.     Right lower leg: No edema.     Left lower leg: No edema.  Skin:    General: Skin is warm and dry.  Neurological:     Mental Status: She is alert. Mental status is at baseline.     Gait: Gait abnormal.     Comments: Oriented to self.   Psychiatric:     Comments: Not following directions as prior.      Labs reviewed: Recent  Labs    02/08/20 0000 02/17/20 0000 04/13/20 0000  NA 139 138 141  K 3.5 4.1 4.7  CL 104 102 102  CO2 27* 30* 27*  BUN 31* 12 11  CREATININE 0.9 0.6 0.7  CALCIUM 9.0 8.4*  9.1   Recent Labs    02/08/20 0000 02/17/20 0000 04/13/20 0000  AST 13 13 15   ALT 10 12 12   ALKPHOS 61 63 68  ALBUMIN 3.8 3.2* 4.0   Recent Labs    02/10/20 0000 02/17/20 0000 04/13/20 0000  WBC 12.7 12.9 9.7  NEUTROABS 8,623.00 8,811.00 6,431.00  HGB 10.9* 10.3* 12.2  HCT 33* 31* 37  PLT 297 320 333   Lab Results  Component Value Date   TSH 3.33 06/24/2019   Lab Results  Component Value Date   HGBA1C 5.9 06/24/2019   Lab Results  Component Value Date   CHOL 275 (H) 04/06/2018   HDL 52 04/06/2018   LDLCALC 190 (H) 04/06/2018   TRIG 161 (H) 04/06/2018   CHOLHDL 5.3 (H) 04/06/2018    Significant Diagnostic Results in last 30 days:  No results found.  Assessment/Plan Vascular dementia without behavioral disturbance (HCC) takes Memantine 28mg  qd, Rivastigmine 9.5mg /24hr, ASA 81mg  qd, progressing, needs to be fed.  Depression, recurrent ([place])  mood is stable, on Escitalopram 10mg  qd.TSH 3.33 06/24/19  GERD (gastroesophageal reflux disease) stable, on Omeprazole 20mg  qd.Hgb12.2 04/13/20  Dysphagia choking episodes in the past.Takes Reglan. Risk for aspiration.Pureed diet.    Allergic rhinitis takes Zyrtec 10mg  qd  Fall Frequent falls, contributory factors are increased frailty and limited safety awareness.  Prediabetes diet controlled, Hgb a1c 5.9 11/24/19. Normal urine micro albumin 11/24/19, had eye exam 09/22/19   Family/ staff Communication: plan of care reviewed with the patient and charge nurse.   Labs/tests ordered:  none  Time spend 35 minutes.

## 2020-06-30 NOTE — Assessment & Plan Note (Signed)
choking episodes in the past.Takes Reglan. Risk for aspiration.Pureed diet.

## 2020-07-03 ENCOUNTER — Encounter: Payer: Self-pay | Admitting: Nurse Practitioner

## 2020-07-24 ENCOUNTER — Encounter: Payer: Self-pay | Admitting: Orthopedic Surgery

## 2020-07-24 ENCOUNTER — Non-Acute Institutional Stay (SKILLED_NURSING_FACILITY): Payer: Medicare PPO | Admitting: Orthopedic Surgery

## 2020-07-24 DIAGNOSIS — F0150 Vascular dementia without behavioral disturbance: Secondary | ICD-10-CM | POA: Diagnosis not present

## 2020-07-24 DIAGNOSIS — J3089 Other allergic rhinitis: Secondary | ICD-10-CM

## 2020-07-24 DIAGNOSIS — R1319 Other dysphagia: Secondary | ICD-10-CM | POA: Diagnosis not present

## 2020-07-24 DIAGNOSIS — F339 Major depressive disorder, recurrent, unspecified: Secondary | ICD-10-CM

## 2020-07-24 DIAGNOSIS — R2681 Unsteadiness on feet: Secondary | ICD-10-CM

## 2020-07-24 DIAGNOSIS — K219 Gastro-esophageal reflux disease without esophagitis: Secondary | ICD-10-CM | POA: Diagnosis not present

## 2020-07-24 NOTE — Progress Notes (Signed)
Location:   [facility] Room Number: 3 Place of Service:  SNF [phone]) Provider:  Windell Moulding, NP  [name], MD  Patient Care Team: [name], MD as PCP - General (Internal Medicine) Lindwood Coke, MD as Consulting Physician (Dermatology) Christophe Louis, MD as Consulting Physician (Obstetrics and Gynecology) Latanya Maudlin, MD as Consulting Physician (Orthopedic Surgery) Garlan Fair, MD as Consulting Physician (Gastroenterology) Barnett Abu., MD as Consulting Physician (Cardiology) Neldon Mc Donnamarie Poag, MD as Consulting Physician (Allergy and Immunology) Rexene Alberts, MD as Consulting Physician (Cardiothoracic Surgery) Melida Quitter, MD as Consulting Physician (Otolaryngology) Ngetich, Nelda Bucks, NP as Nurse Practitioner (Family Medicine)  Extended Emergency Contact Information Primary Emergency Contact: Kathie Rhodes of [place] Phone: [phone] Mobile Phone: [phone] Relation: Legal Guardian Secondary Emergency Contact: Leana Gamer [place] of [place] Phone: [phone] Relation: Uncle  Code Status:  DNR Goals of care: Advanced Directive information Advanced Directives 07/24/2020  Does Patient Have a Medical Advance Directive? Yes  Type of Advance Directive [place]  Does patient want to make changes to medical advance directive? No - Patient declined  Copy of [place] in Chart? Yes - validated most recent copy scanned in chart (See row information)  Pre-existing out of facility DNR order (yellow form or pink MOST form) -     No chief complaint on file.   HPI:  Pt is a 87 y.o. female seen today for medical management of chronic diseases.    Today, she is sitting in her wheelchair eating breakfast. Alert and oriented to self and familiar faces. Follows commands and can express some needs. Often seen wondering halls or sitting at nurses station. She  only says a few words during our encounter. Words often do not match questions asked. She is very pleasant. PT discontinued 03/30. No recent falls. Still able to feed self. No recent aspirations. Remains on pureed diet. No recent behavioral outbursts.   Recent blood pressures are as follows:  04/19- 122/64  04/12- 144/75  04/05- 156/83  03/29- 122/68  Weight trends are as follows:  04/01- 141.6 lbs  03/01- 142.8 lbs  02/01- 139.3 lbs  01/03- 140 lbs  Nurse does not report any concerns, vitals stable.   Past Medical History:  Diagnosis Date  . Abnormal CT scan, chest September 2011   Scar tissue  . Allergic rhinitis   . Alzheimer's dementia ([place])   . Asthma   . Diabetes mellitus    borderline and under control-diet ,exercise  . Esophageal stricture   . Family history of colon cancer    Mother  . GERD (gastroesophageal reflux disease)   . Hiatal hernia   . HLD (hyperlipidemia)   . Hypertension   . Osteopenia   . Pneumonia 12-15 yrs.ago   yrs. ago  . TIA (transient ischemic attack)   . Vitamin D deficiency    Past Surgical History:  Procedure Laterality Date  . ABDOMINAL HYSTERECTOMY  1980   . BALLOON DILATION N/A 09/27/2018   Procedure: BALLOON DILATION;  Surgeon: Jackquline Denmark, MD;  [facility];  Service: Endoscopy;  Laterality: N/A;  . BIOPSY  09/27/2018   Procedure: BIOPSY;  Surgeon: Jackquline Denmark, MD;  [facility];  Service: Endoscopy;;  . CATARACT EXTRACTION Left 2014  . ESOPHAGOGASTRODUODENOSCOPY (EGD) WITH PROPOFOL N/A 09/27/2018   Procedure: ESOPHAGOGASTRODUODENOSCOPY (EGD) WITH PROPOFOL;  Surgeon: Jackquline Denmark, MD;  [facility];  Service: Endoscopy;  Laterality:  N/A;  . SHOULDER OPEN ROTATOR CUFF REPAIR  05/22/2011   Procedure: ROTATOR CUFF REPAIR SHOULDER OPEN;  Surgeon: Tobi Bastos, MD;  [facility];  Service: Orthopedics;  Laterality: Left;  . THORACOTOMY / DECORTICATION PARIETAL PLEURA Right 204   empyema  .  TONSILLECTOMY      Allergies  Allergen Reactions  . Aricept [Donepezil Hcl] Diarrhea  . Crestor [Rosuvastatin Calcium]     Myalgia  . Lipitor [Atorvastatin Calcium]   . Lovastatin     Myalgia  . Micardis [Telmisartan]     Fatigue  . Sertraline Diarrhea  . Welchol [Colesevelam Hcl]     Constipation  . Zetia [Ezetimibe]     Myalgia  . Zocor [Simvastatin]     Myalgia    Allergies as of 07/24/2020      Reactions   Aricept [donepezil Hcl] Diarrhea   Crestor [rosuvastatin Calcium]    Myalgia   Lipitor [atorvastatin Calcium]    Lovastatin    Myalgia   Micardis [telmisartan]    Fatigue   Sertraline Diarrhea   Welchol [colesevelam Hcl]    Constipation   Zetia [ezetimibe]    Myalgia   Zocor [simvastatin]    Myalgia      Medication List       Accurate as of July 24, 2020 10:26 AM. If you have any questions, ask your nurse or doctor.        STOP taking these medications   lactose free nutrition Liqd Stopped by: [name], NP     TAKE these medications   acetaminophen 325 MG tablet Commonly known as: TYLENOL Take 325 mg by mouth every 6 (six) hours as needed for headache. Take Two Tablets ( 650 ) as needed.   aspirin EC 81 MG tablet Take 81 mg by mouth daily.   Calcium 600-200 MG-UNIT tablet Take 1 tablet by mouth daily. In the morning 7-11 am   cetirizine 10 MG tablet Commonly known as: ZYRTEC Take 10 mg by mouth daily.   escitalopram 10 MG tablet Commonly known as: LEXAPRO Take 1 tablet (10 mg total) by mouth daily.   memantine 28 MG Cp24 24 hr capsule Commonly known as: NAMENDA XR TAKE ONE CAPSULE ONCE A DAY TO PRESERVE MEMORY.   metoCLOPramide 5 MG tablet Commonly known as: REGLAN Take 5 mg by mouth in the morning and at bedtime.   NON FORMULARY Med Pass 2.0 liquid; 2.0; amt: 180ML.; oral Special Instructions: Med Pass 2.0 180ML QD @ 2 PM. Doc. % consumed. Once a day   NON FORMULARY Med Pass 2.0 liquid; 2.0; amt: 180ML.; oral Special  Instructions: Med Pass 2.0 180ML QD @ 2 PM. Doc. % consumed. Three times a day   omeprazole 20 MG capsule Commonly known as: PRILOSEC Take 20 mg by mouth daily. Do not crush; Open capsule and sprinkle on applesauce   PreserVision AREDS 2 Chew Chew 1 tablet by mouth in the morning and at bedtime.   PreviDent 5000 Booster Plus 1.1 % Pste Generic drug: Sodium Fluoride Place 1 application onto teeth every evening.   rivastigmine 9.5 mg/24hr Commonly known as: EXELON Apply fresh patch daily and remove old patch to help preserve memory   zinc oxide 20 % ointment Apply 1 application topically as needed for irritation. Apply to buttocks/peri topical as needed       Review of Systems  Unable to perform ROS: Dementia    Immunization History  Administered Date(s) Administered  . Influenza, High Dose  Seasonal PF 01/14/2019, 01/04/2020  . Influenza,inj,Quad PF,6+ Mos 12/29/2017  . Influenza-Unspecified 12/01/2014, 01/11/2016, 01/04/2020  . Moderna Sars-Covid-2 Vaccination 04/05/2019, 05/03/2019, 02/14/2020  . Pneumococcal Conjugate-13 11/22/2013  . Pneumococcal Polysaccharide-23 07/01/2006  . Tdap 12/17/2011  . Zoster 12/25/2005   Pertinent  Health Maintenance Due  Topic Date Due  . OPHTHALMOLOGY EXAM  Never done  . URINE MICROALBUMIN  02/11/2017  . HEMOGLOBIN A1C  12/25/2019  . FOOT EXAM  04/28/2020  . INFLUENZA VACCINE  10/30/2020  . DEXA SCAN  Completed  . PNA vac Low Risk Adult  Completed   Fall Risk  06/17/2018 04/22/2018 01/22/2017 07/23/2016 04/25/2015  Falls in the past year? - 0 No No No  Number falls in past yr: - 0 - - -  Injury with Fall? - 0 - - -  Risk for fall due to : Impaired vision - - - -   Functional Status Survey:    Vitals:   07/24/20 0957  BP: 122/64  Pulse: 72  Resp: 20  Temp: (!) 97 F (36.1 C)  SpO2: 99%  Weight: 141 lb 9.6 oz (64.2 kg)  Height: 5' (1.524 m)   Body mass index is 27.65 kg/m. Physical Exam Vitals reviewed.   Constitutional:      General: She is not in acute distress. HENT:     Head: Normocephalic.     Right Ear: There is no impacted cerumen.     Left Ear: There is no impacted cerumen.     Nose: Nose normal.     Mouth/Throat:     Mouth: Mucous membranes are moist.  Eyes:     General:        Right eye: No discharge.        Left eye: No discharge.  Cardiovascular:     Rate and Rhythm: Normal rate and regular rhythm.     Pulses: Normal pulses.     Heart sounds: Normal heart sounds.  Pulmonary:     Effort: Pulmonary effort is normal. No respiratory distress.     Breath sounds: Normal breath sounds. No wheezing.  Abdominal:     General: Bowel sounds are normal. There is no distension.     Palpations: Abdomen is soft.     Tenderness: There is no abdominal tenderness.  Musculoskeletal:     Cervical back: Normal range of motion.     Right lower leg: No edema.     Left lower leg: No edema.  Lymphadenopathy:     Cervical: No cervical adenopathy.  Skin:    General: Skin is warm and dry.     Capillary Refill: Capillary refill takes less than 2 seconds.  Neurological:     Mental Status: She is alert. Mental status is at baseline.     Motor: Weakness present.     Gait: Gait abnormal.  Psychiatric:        Mood and Affect: Mood normal.        Behavior: Behavior normal.        Cognition and Memory: Memory is impaired.     Labs reviewed: Recent Labs    02/08/20 0000 02/17/20 0000 04/13/20 0000  NA 139 138 141  K 3.5 4.1 4.7  CL 104 102 102  CO2 27* 30* 27*  BUN 31* 12 11  CREATININE 0.9 0.6 0.7  CALCIUM 9.0 8.4* 9.1   Recent Labs    02/08/20 0000 02/17/20 0000 04/13/20 0000  AST 13 13 15   ALT 10 12 12   ALKPHOS  61 63 68  ALBUMIN 3.8 3.2* 4.0   Recent Labs    02/10/20 0000 02/17/20 0000 04/13/20 0000  WBC 12.7 12.9 9.7  NEUTROABS 8,623.00 8,811.00 6,431.00  HGB 10.9* 10.3* 12.2  HCT 33* 31* 37  PLT 297 320 333   Lab Results  Component Value Date   TSH 3.33  06/24/2019   Lab Results  Component Value Date   HGBA1C 5.9 06/24/2019   Lab Results  Component Value Date   CHOL 275 (H) 04/06/2018   HDL 52 04/06/2018   LDLCALC 190 (H) 04/06/2018   TRIG 161 (H) 04/06/2018   CHOLHDL 5.3 (H) 04/06/2018    Significant Diagnostic Results in last 30 days:  No results found.  Assessment/Plan 1. Vascular dementia without behavioral disturbance ([place]) - no recent behavioral outbursts - continues to ambulated and feed self - cont namenda  2. Depression, recurrent ([place]) - stable with lexapro  3. Gastroesophageal reflux disease, unspecified whether esophagitis present - stable with omeprazole - cbc/diff  4. Esophageal dysphagia - not recent aspirations, cont pureed diet - cont to sit upright when eating  5. Non-seasonal allergic rhinitis, unspecified trigger - stable with daily zyrtec  6. Unsteady gait - PT discontinued 03/30 - no recent falls - cont falls safety precuations    Family/ staff Communication: plan discussed with nurse  Labs/tests ordered:  Cbc/diff, bmp, hepatic panel, TSH, A1C

## 2020-07-28 LAB — COMPREHENSIVE METABOLIC PANEL
Albumin: 3.6 (ref 3.5–5.0)
Calcium: 8.7 (ref 8.7–10.7)
GFR calc Af Amer: 92
GFR calc non Af Amer: 80
Globulin: 2.6

## 2020-07-28 LAB — CBC AND DIFFERENTIAL
HCT: 34 — AB (ref 36–46)
Hemoglobin: 11.2 — AB (ref 12.0–16.0)
Neutrophils Absolute: 3109
Platelets: 251 (ref 150–399)
WBC: 6.6

## 2020-07-28 LAB — BASIC METABOLIC PANEL
BUN: 13 (ref 4–21)
CO2: 30 — AB (ref 13–22)
Chloride: 105 (ref 99–108)
Creatinine: 0.7 (ref 0.5–1.1)
Glucose: 84
Potassium: 4.3 (ref 3.4–5.3)
Sodium: 141 (ref 137–147)

## 2020-07-28 LAB — TSH: TSH: 2.73 (ref 0.41–5.90)

## 2020-07-28 LAB — CBC: RBC: 3.7 — AB (ref 3.87–5.11)

## 2020-07-28 LAB — HEPATIC FUNCTION PANEL
ALT: 12 (ref 7–35)
AST: 13 (ref 13–35)
Alkaline Phosphatase: 52 (ref 25–125)
Bilirubin, Total: 0.7

## 2020-07-28 LAB — HEMOGLOBIN A1C: Hemoglobin A1C: 5.5

## 2020-08-15 ENCOUNTER — Non-Acute Institutional Stay (SKILLED_NURSING_FACILITY): Payer: Medicare PPO | Admitting: Nurse Practitioner

## 2020-08-15 ENCOUNTER — Encounter: Payer: Self-pay | Admitting: Nurse Practitioner

## 2020-08-15 DIAGNOSIS — R296 Repeated falls: Secondary | ICD-10-CM

## 2020-08-15 DIAGNOSIS — F339 Major depressive disorder, recurrent, unspecified: Secondary | ICD-10-CM | POA: Diagnosis not present

## 2020-08-15 DIAGNOSIS — R1319 Other dysphagia: Secondary | ICD-10-CM

## 2020-08-15 DIAGNOSIS — R7303 Prediabetes: Secondary | ICD-10-CM | POA: Diagnosis not present

## 2020-08-15 DIAGNOSIS — J3089 Other allergic rhinitis: Secondary | ICD-10-CM | POA: Diagnosis not present

## 2020-08-15 DIAGNOSIS — G301 Alzheimer's disease with late onset: Secondary | ICD-10-CM

## 2020-08-15 DIAGNOSIS — K219 Gastro-esophageal reflux disease without esophagitis: Secondary | ICD-10-CM

## 2020-08-15 DIAGNOSIS — F0280 Dementia in other diseases classified elsewhere without behavioral disturbance: Secondary | ICD-10-CM

## 2020-08-15 DIAGNOSIS — R2681 Unsteadiness on feet: Secondary | ICD-10-CM

## 2020-08-15 NOTE — Assessment & Plan Note (Signed)
Doesn't remember using rails or walker for ambulation which is contributory to falling.

## 2020-08-15 NOTE — Assessment & Plan Note (Signed)
Allergic rhinitis, takes Zyrtec 10mg qd  

## 2020-08-15 NOTE — Progress Notes (Signed)
Location:   [facility] Room Number: Elim of Service:  SNF (31) Provider:  Margueritte Guthridge Otho Darner, NP    Patient Care Team: [name], MD as PCP - General (Internal Medicine) Lindwood Coke, MD as Consulting Physician (Dermatology) Christophe Louis, MD as Consulting Physician (Obstetrics and Gynecology) Latanya Maudlin, MD as Consulting Physician (Orthopedic Surgery) Garlan Fair, MD as Consulting Physician (Gastroenterology) Barnett Abu., MD as Consulting Physician (Cardiology) Neldon Mc Donnamarie Poag, MD as Consulting Physician (Allergy and Immunology) Rexene Alberts, MD as Consulting Physician (Cardiothoracic Surgery) Melida Quitter, MD as Consulting Physician (Otolaryngology) Ngetich, Nelda Bucks, NP as Nurse Practitioner (Family Medicine)  Extended Emergency Contact Information Primary Emergency Contact: Kathie Rhodes of [place] Phone: [phone] Mobile Phone: [phone] Relation: Legal Guardian Secondary Emergency Contact: Leana Gamer [place] of [place] Phone: [phone] Relation: Uncle  Code Status:  DNR Goals of care: Advanced Directive information Advanced Directives 08/15/2020  Does Patient Have a Medical Advance Directive? Yes  Type of Paramedic of [place];Out of facility DNR (pink MOST or yellow form)  Does patient want to make changes to medical advance directive? No - Patient declined  Copy of [place] in Chart? Yes - validated most recent copy scanned in chart (See row information)  Pre-existing out of facility DNR order (yellow form or pink MOST form) Yellow form placed in chart (order not valid for inpatient use);Pink MOST form placed in chart (order not valid for inpatient use)     Chief Complaint  Patient presents with  . Medical Management of Chronic Issues    Routine follow up.  Marland Kitchen Health Maintenance    Discuss need for ophthalmology exam, urine  microalbumin, and foot exam.     HPI:  Pt is a 87 y.o. female seen today for medical management of chronic diseases.      Frequent falls, contributory factors are increased frailty and limited safety awareness. Dysphagia, choking episodes in the past.Takes Reglan. Risk for aspiration.Pureed diet.  Dementia, takes Memantine 28mg  qd, Rivastigmine 9.5mg /24hr, ASA 81mg  qd, progressing, needs to be fed.  Her mood is stable, on Escitalopram 10mg  qd.TSH 2.73 07/28/20 GERD, stable, on Omeprazole 20mg  qd.Hgb11.2 07/28/20 Allergic rhinitis, takes Zyrtec 10mg  qd Prediabetes diet controlled, Hgb 5.5 07/28/20. Normal urine micro albumin 11/24/19, had eye exam 09/22/19   Past Medical History:  Diagnosis Date  . Abnormal CT scan, chest September 2011   Scar tissue  . Allergic rhinitis   . Alzheimer's dementia ([place])   . Asthma   . Diabetes mellitus    borderline and under control-diet ,exercise  . Esophageal stricture   . Family history of colon cancer    Mother  . GERD (gastroesophageal reflux disease)   . Hiatal hernia   . HLD (hyperlipidemia)   . Hypertension   . Osteopenia   . Pneumonia 12-15 yrs.ago   yrs. ago  . TIA (transient ischemic attack)   . Vitamin D deficiency    Past Surgical History:  Procedure Laterality Date  . ABDOMINAL HYSTERECTOMY  1980   . BALLOON DILATION N/A 09/27/2018   Procedure: BALLOON DILATION;  Surgeon: Jackquline Denmark, MD;  [facility];  Service: Endoscopy;  Laterality: N/A;  . BIOPSY  09/27/2018   Procedure: BIOPSY;  Surgeon: Jackquline Denmark, MD;  [facility];  Service: Endoscopy;;  . CATARACT EXTRACTION Left 2014  . ESOPHAGOGASTRODUODENOSCOPY (EGD) WITH PROPOFOL N/A 09/27/2018   Procedure: ESOPHAGOGASTRODUODENOSCOPY (EGD) WITH PROPOFOL;  Surgeon: Gupta, Rajesh, MD;  [facility];  Service: Endoscopy;  Laterality: N/A;  . SHOULDER OPEN  ROTATOR CUFF REPAIR  05/22/2011   Procedure: ROTATOR CUFF REPAIR SHOULDER OPEN;  Surgeon: Ronald A Gioffre, MD;  [facility];  Service: Orthopedics;  Laterality: Left;  . THORACOTOMY / DECORTICATION PARIETAL PLEURA Right 204   empyema  . TONSILLECTOMY      Allergies  Allergen Reactions  . Aricept [Donepezil Hcl] Diarrhea  . Crestor [Rosuvastatin Calcium]     Myalgia  . Lipitor [Atorvastatin Calcium]   . Lovastatin     Myalgia  . Micardis [Telmisartan]     Fatigue  . Sertraline Diarrhea  . Welchol [Colesevelam Hcl]     Constipation  . Zetia [Ezetimibe]     Myalgia  . Zocor [Simvastatin]     Myalgia    Allergies as of 08/15/2020      Reactions   Aricept [donepez<MEASUREBoulder Community MusculKentuckyGus K44 Windy Ethel47eKentuckyne Halel DriveantleHansen657(QRidged AMusician Crestor [<MEASURENew Ibe<MEKentuck2.13AChaThad Ranger Grimestem21308AmbSantinCh29.5Merian CaprGastrointestinal Associates EndoOhiosco G>EC Take 20 mg by mouth daily. Do not crush; Open capsule and sprinkle on applesauce   PreserVision AREDS 2 Chew Chew 1 tablet by mouth in the morning and at bedtime.   PreviDent 5000 Booster Plus 1.1 % Pste Generic drug: Sodium Fluoride Place 1 application onto teeth every evening.   rivastigmine 9.5 mg/24hr Commonly known as: EXELON Apply fresh patch daily and remove old patch to help preserve memory   zinc oxide 20 % ointment Apply 1 application topically as needed for irritation. Apply to buttocks/peri topical as needed       Review  of Systems  Constitutional: Negative for activity change, appetite change, fatigue and fever.  HENT: Positive for hearing loss and trouble swallowing. Negative for congestion and voice change.   Eyes: Negative for visual disturbance.  Respiratory: Negative for cough.   Cardiovascular: Negative for leg swelling.  Gastrointestinal: Negative for abdominal pain and constipation.  Genitourinary: Negative for dysuria and urgency.  Musculoskeletal: Positive for gait problem.  Skin: Negative for color change.  Neurological: Positive for speech difficulty. Negative for headaches.       Dementia, expressive aphasia.   Psychiatric/Behavioral: Positive for confusion. Negative for behavioral problems and sleep disturbance. The patient is not nervous/anxious.     Immunization History  Administered Date(s) Administered  . Influenza, High Dose Seasonal PF 01/14/2019, 01/04/2020  . Influenza,inj,Quad PF,6+ Mos 12/29/2017  .  Influenza-Unspecified 12/01/2014, 01/11/2016, 01/04/2020  . Moderna Sars-Covid-2 Vaccination 04/05/2019, 05/03/2019, 02/14/2020  . Pneumococcal Conjugate-13 11/22/2013  . Pneumococcal Polysaccharide-23 07/01/2006  . Tdap 12/17/2011  . Zoster 12/25/2005   Pertinent  Health Maintenance Due  Topic Date Due  . OPHTHALMOLOGY EXAM  Never done  . URINE MICROALBUMIN  02/11/2017  . INFLUENZA VACCINE  10/30/2020  . HEMOGLOBIN A1C  01/27/2021  . FOOT EXAM  08/15/2021  . DEXA SCAN  Completed  . PNA vac Low Risk Adult  Completed   Fall Risk  06/17/2018 04/22/2018 01/22/2017 07/23/2016 04/25/2015  Falls in the past year? - 0 No No No  Number falls in past yr: - 0 - - -  Injury with Fall? - 0 - - -  Risk for fall due to : Impaired vision - - - -   Functional Status Survey:    Vitals:   08/15/20 1102  BP: 137/64  Pulse: 67  Resp: (!) 22  Temp: (!) 97.3 F (36.3 C)  SpO2: 96%  Weight: 143 lb 6.4 oz (65 kg)  Height: 5' (1.524 m)   Body mass index is 28.01 kg/m. Physical Exam Vitals and nursing note reviewed.  Constitutional:      Appearance: Normal appearance.  HENT:     Head: Normocephalic and atraumatic.     Jaw: No swelling or pain on movement.     Salivary Glands: Right salivary gland is not diffusely enlarged or tender. Left salivary gland is not diffusely enlarged or tender.     Mouth/Throat:     Mouth: Mucous membranes are moist.  Eyes:     Extraocular Movements: Extraocular movements intact.     Conjunctiva/sclera: Conjunctivae normal.     Pupils: Pupils are equal, round, and reactive to light.  Cardiovascular:     Rate and Rhythm: Normal rate and regular rhythm.     Heart sounds: No murmur heard.   Pulmonary:     Effort: Pulmonary effort is normal.     Breath sounds: No rales.  Abdominal:     General: Bowel sounds are normal.     Palpations: Abdomen is soft.     Tenderness: There is no abdominal tenderness.  Musculoskeletal:     Cervical back: Normal range of  motion and neck supple.     Right lower leg: No edema.     Left lower leg: No edema.  Skin:    General: Skin is warm and dry.  Neurological:     Mental Status: She is alert. Mental status is at baseline.     Gait: Gait abnormal.     Comments: Oriented to self.   Psychiatric:     Comments: Not following directions as  prior.      Labs reviewed: Recent Labs    02/17/20 0000 04/13/20 0000 07/28/20 0000  NA 138 141 141  K 4.1 4.7 4.3  CL 102 102 105  CO2 30* 27* 30*  BUN 12 11 13   CREATININE 0.6 0.7 0.7  CALCIUM 8.4* 9.1 8.7   Recent Labs    02/17/20 0000 04/13/20 0000 07/28/20 0000  AST 13 15 13   ALT 12 12 12   ALKPHOS 63 68 52  ALBUMIN 3.2* 4.0 3.6   Recent Labs    02/17/20 0000 04/13/20 0000 07/28/20 0000  WBC 12.9 9.7 6.6  NEUTROABS 8,811.00 6,431.00 3,109.00  HGB 10.3* 12.2 11.2*  HCT 31* 37 34*  PLT 320 333 251   Lab Results  Component Value Date   TSH 2.73 07/28/2020   Lab Results  Component Value Date   HGBA1C 5.5 07/28/2020   Lab Results  Component Value Date   CHOL 275 (H) 04/06/2018   HDL 52 04/06/2018   LDLCALC 190 (H) 04/06/2018   TRIG 161 (H) 04/06/2018   CHOLHDL 5.3 (H) 04/06/2018    Significant Diagnostic Results in last 30 days:  No results found.  Assessment/Plan GERD (gastroesophageal reflux disease) stable, on Omeprazole 20mg  qd.Hgb11.2 07/28/20   Allergic rhinitis Allergic rhinitis, takes Zyrtec 10mg  qd  Prediabetes Prediabetes diet controlled, Hgb 5.5 07/28/20. Normal urine micro albumin 11/24/19, had eye exam 09/22/19  Depression, recurrent ([place]) Her mood is stable, on Escitalopram 10mg  qd.TSH 2.73 07/28/20  Alzheimer's dementia No behavioral issues, resides SNF [facility] for safety, care needs, takes Memantine 28mg  qd, Rivastigmine 9.5mg /24hr, ASA 81mg  qd, progressing, needs to be fed.    Dysphagia choking episodes in the past.Takes Reglan. Risk for aspiration.Pureed diet.    Frequent falls contributory factors  are increased frailty and limited safety awareness.  Unsteady gait Doesn't remember using rails or walker for ambulation which is contributory to falling.      Family/ staff Communication: plan of care reviewed with the patient and charge nurse.   Labs/tests ordered:  none  Time spend 35 minutes.

## 2020-08-15 NOTE — Assessment & Plan Note (Signed)
stable, on Omeprazole 20mg  qd.Hgb11.2 07/28/20

## 2020-08-15 NOTE — Assessment & Plan Note (Signed)
No behavioral issues, resides SNF [facility] for safety, care needs, takes Memantine 28mg  qd, Rivastigmine 9.5mg /24hr, ASA 81mg  qd, progressing, needs to be fed.

## 2020-08-15 NOTE — Assessment & Plan Note (Signed)
contributory factors are increased frailty and limited safety awareness.  

## 2020-08-15 NOTE — Assessment & Plan Note (Signed)
Prediabetes diet controlled, Hgb 5.5 07/28/20. Normal urine micro albumin 11/24/19, had eye exam 09/22/19

## 2020-08-15 NOTE — Assessment & Plan Note (Signed)
Her mood is stable, on Escitalopram 10mg qd.TSH 2.73 07/28/20  

## 2020-08-15 NOTE — Assessment & Plan Note (Signed)
choking episodes in the past.Takes Reglan. Risk for aspiration.Pureed diet. 

## 2020-08-29 ENCOUNTER — Non-Acute Institutional Stay (SKILLED_NURSING_FACILITY): Payer: Medicare PPO | Admitting: Nurse Practitioner

## 2020-08-29 ENCOUNTER — Encounter: Payer: Self-pay | Admitting: Nurse Practitioner

## 2020-08-29 DIAGNOSIS — J3089 Other allergic rhinitis: Secondary | ICD-10-CM

## 2020-08-29 DIAGNOSIS — W19XXXA Unspecified fall, initial encounter: Secondary | ICD-10-CM

## 2020-08-29 DIAGNOSIS — R1319 Other dysphagia: Secondary | ICD-10-CM | POA: Diagnosis not present

## 2020-08-29 DIAGNOSIS — J690 Pneumonitis due to inhalation of food and vomit: Secondary | ICD-10-CM

## 2020-08-29 DIAGNOSIS — F0150 Vascular dementia without behavioral disturbance: Secondary | ICD-10-CM | POA: Diagnosis not present

## 2020-08-29 DIAGNOSIS — K219 Gastro-esophageal reflux disease without esophagitis: Secondary | ICD-10-CM | POA: Diagnosis not present

## 2020-08-29 DIAGNOSIS — F339 Major depressive disorder, recurrent, unspecified: Secondary | ICD-10-CM

## 2020-08-29 NOTE — Assessment & Plan Note (Signed)
choking episodes in the past.Takes Reglan. Risk for aspiration.Pureed diet.

## 2020-08-29 NOTE — Assessment & Plan Note (Signed)
stable, on Omeprazole 20mg  qd.Hgb11.2 07/28/20

## 2020-08-29 NOTE — Progress Notes (Addendum)
Location:    [place] Room Number: 3 Place of Service:  SNF (31) Provider:  Bristyl Mclees, Lennie Odor NP  [name], MD  Patient Care Team: [name], MD as PCP - General (Internal Medicine) Lindwood Coke, MD as Consulting Physician (Dermatology) Christophe Louis, MD as Consulting Physician (Obstetrics and Gynecology) Latanya Maudlin, MD as Consulting Physician (Orthopedic Surgery) Garlan Fair, MD as Consulting Physician (Gastroenterology) Barnett Abu., MD as Consulting Physician (Cardiology) Neldon Mc Donnamarie Poag, MD as Consulting Physician (Allergy and Immunology) Rexene Alberts, MD as Consulting Physician (Cardiothoracic Surgery) Melida Quitter, MD as Consulting Physician (Otolaryngology) Ngetich, Nelda Bucks, NP as Nurse Practitioner (Family Medicine)  Extended Emergency Contact Information Primary Emergency Contact: Kathie Rhodes of [place] Phone: [phone] Mobile Phone: [phone] Relation: Legal Guardian Secondary Emergency Contact: Leana Gamer [place] of [place] Phone: [phone] Relation: Uncle  Code Status:  DNR Managed Care Goals of care: Advanced Directive information Advanced Directives 08/15/2020  Does Patient Have a Medical Advance Directive? Yes  Type of Paramedic of [place];Out of facility DNR (pink MOST or yellow form)  Does patient want to make changes to medical advance directive? No - Patient declined  Copy of [place] in Chart? Yes - validated most recent copy scanned in chart (See row information)  Pre-existing out of facility DNR order (yellow form or pink MOST form) Yellow form placed in chart (order not valid for inpatient use);Pink MOST form placed in chart (order not valid for inpatient use)     Chief Complaint  Patient presents with  . Acute Visit    Aspiration    HPI:  Pt is a 87 y.o. female seen today for an acute visit for  aspiration,    08/28/20 CXR mild patchy right mid, bilateral lower lung opacities. Augmentin 875mg  bid x 7 days started subsequently, prn DuoNeb initiated. The patient is afebrile, walking on her own, occasional cough, denied chest pain, no O2 desaturation.   Frequent falls, contributory factors are increased frailty and limited safety awareness. Dysphagia, choking episodes in the past.Takes Reglan. Risk for aspiration.Pureed diet. Dementia, takes Memantine 28mg  qd, Rivastigmine 9.5mg /24hr, ASA 81mg  qd, progressing, needs to be fed. Her mood is stable, on Escitalopram 10mg  qd.TSH 2.73 07/28/20 GERD, stable, on Omeprazole 20mg  qd.Hgb11.2 07/28/20 Allergic rhinitis, takes Zyrtec 10mg  qd Prediabetes diet controlled, Hgb 5.5 07/28/20. Normal urine micro albumin 11/24/19, had eye exam 09/22/19   Past Medical History:  Diagnosis Date  . Abnormal CT scan, chest September 2011   Scar tissue  . Allergic rhinitis   . Alzheimer's dementia ([place])   . Asthma   . Diabetes mellitus    borderline and under control-diet ,exercise  . Esophageal stricture   . Family history of colon cancer    Mother  . GERD (gastroesophageal reflux disease)   . Hiatal hernia   . HLD (hyperlipidemia)   . Hypertension   . Osteopenia   . Pneumonia 12-15 yrs.ago   yrs. ago  . TIA (transient ischemic attack)   . Vitamin D deficiency    Past Surgical History:  Procedure Laterality Date  . ABDOMINAL HYSTERECTOMY  1980   . BALLOON DILATION N/A 09/27/2018   Procedure: BALLOON DILATION;  Surgeon: Jackquline Denmark, MD;  [facility];  Service: Endoscopy;  Laterality: N/A;  . BIOPSY  09/27/2018   Procedure: BIOPSY;  Surgeon: Jackquline Denmark, MD;  [facility];  Service: Endoscopy;;  . CATARACT EXTRACTION Left  2014  . ESOPHAGOGASTRODUODENOSCOPY (EGD) WITH PROPOFOL N/A 09/27/2018   Procedure: ESOPHAGOGASTRODUODENOSCOPY  (EGD) WITH PROPOFOL;  Surgeon: Jackquline Denmark, MD;  [facility];  Service: Endoscopy;  Laterality: N/A;  . SHOULDER OPEN ROTATOR CUFF REPAIR  05/22/2011   Procedure: ROTATOR CUFF REPAIR SHOULDER OPEN;  Surgeon: Tobi Bastos, MD;  [facility];  Service: Orthopedics;  Laterality: Left;  . THORACOTOMY / DECORTICATION PARIETAL PLEURA Right 204   empyema  . TONSILLECTOMY      Allergies  Allergen Reactions  . Aricept [Donepezil Hcl] Diarrhea  . Crestor [Rosuvastatin Calcium]     Myalgia  . Lipitor [Atorvastatin Calcium]   . Lovastatin     Myalgia  . Micardis [Telmisartan]     Fatigue  . Sertraline Diarrhea  . Welchol [Colesevelam Hcl]     Constipation  . Zetia [Ezetimibe]     Myalgia  . Zocor [Simvastatin]     Myalgia    Allergies as of 08/29/2020      Reactions   Aricept [donepezil Hcl] Diarrhea   Crestor [rosuvastatin Calcium]    Myalgia   Lipitor [atorvastatin Calcium]    Lovastatin    Myalgia   Micardis [telmisartan]    Fatigue   Sertraline Diarrhea   Welchol [colesevelam Hcl]    Constipation   Zetia [ezetimibe]    Myalgia   Zocor [simvastatin]    Myalgia      Medication List       Accurate as of Aug 29, 2020 11:59 AM. If you have any questions, ask your nurse or doctor.        acetaminophen 325 MG tablet Commonly known as: TYLENOL Take 325 mg by mouth every 6 (six) hours as needed for headache. Take Two Tablets ( 650 ) as needed.   amoxicillin-clavulanate 875-125 MG tablet Commonly known as: AUGMENTIN Take 1 tablet by mouth 2 (two) times daily.   aspirin EC 81 MG tablet Take 81 mg by mouth daily.   Calcium 600-200 MG-UNIT tablet Take 1 tablet by mouth daily. In the morning 7-11 am   cetirizine 10 MG tablet Commonly known as: ZYRTEC Take 10 mg by mouth daily.   CHLORHEXIDINE GLUCONATE MT Use as directed 1 Dose in the mouth or throat daily. 1 tsp of solution to each teeth and gums with a toothbrush. Spit excess and do not rinse.    escitalopram 10 MG tablet Commonly known as: LEXAPRO Take 1 tablet (10 mg total) by mouth daily.   ipratropium-albuterol 0.5-2.5 (3) MG/3ML Soln Commonly known as: DUONEB Take 3 mLs by nebulization every 6 (six) hours as needed.   memantine 28 MG Cp24 24 hr capsule Commonly known as: NAMENDA XR TAKE ONE CAPSULE ONCE A DAY TO PRESERVE MEMORY.   metoCLOPramide 5 MG tablet Commonly known as: REGLAN Take 5 mg by mouth in the morning and at bedtime.   NON FORMULARY Med Pass 2.0 liquid; 2.0; amt: 180ML.; oral Special Instructions: Med Pass 2.0 180ML QD @ 2 PM. Doc. % consumed. Once a day   NON FORMULARY Med Pass 2.0 liquid; 2.0; amt: 180ML.; oral Special Instructions: Med Pass 2.0 180ML QD @ 2 PM. Doc. % consumed. Three times a day   omeprazole 20 MG capsule Commonly known as: PRILOSEC Take 20 mg by mouth daily. Do not crush; Open capsule and sprinkle on applesauce   PreserVision AREDS 2 Chew Chew 1 tablet by mouth in the morning and at bedtime.   PreviDent 5000 Booster Plus 1.1 % Pste Generic drug:  Sodium Fluoride Place 1 application onto teeth every evening.   rivastigmine 9.5 mg/24hr Commonly known as: EXELON Apply fresh patch daily and remove old patch to help preserve memory   zinc oxide 20 % ointment Apply 1 application topically as needed for irritation. Apply to buttocks/peri topical as needed       Review of Systems  Constitutional: Negative for activity change, appetite change, fatigue and fever.  HENT: Positive for hearing loss and trouble swallowing. Negative for congestion and voice change.   Eyes: Negative for visual disturbance.  Respiratory: Positive for cough and choking. Negative for chest tightness, shortness of breath and wheezing.   Cardiovascular: Negative for chest pain and leg swelling.  Gastrointestinal: Negative for abdominal pain and constipation.  Genitourinary: Negative for dysuria and urgency.  Musculoskeletal: Positive for gait  problem.  Skin: Negative for color change.  Neurological: Positive for speech difficulty. Negative for headaches.       Dementia, expressive aphasia.   Psychiatric/Behavioral: Positive for confusion. Negative for behavioral problems and sleep disturbance. The patient is not nervous/anxious.     Immunization History  Administered Date(s) Administered  . Influenza, High Dose Seasonal PF 01/14/2019, 01/04/2020  . Influenza,inj,Quad PF,6+ Mos 12/29/2017  . Influenza-Unspecified 12/01/2014, 01/11/2016, 01/04/2020  . Moderna Sars-Covid-2 Vaccination 04/05/2019, 05/03/2019, 02/14/2020  . Pneumococcal Conjugate-13 11/22/2013  . Pneumococcal Polysaccharide-23 07/01/2006  . Tdap 12/17/2011  . Zoster, Live 12/25/2005   Pertinent  Health Maintenance Due  Topic Date Due  . OPHTHALMOLOGY EXAM  Never done  . URINE MICROALBUMIN  02/11/2017  . INFLUENZA VACCINE  10/30/2020  . HEMOGLOBIN A1C  01/27/2021  . FOOT EXAM  08/15/2021  . DEXA SCAN  Completed  . PNA vac Low Risk Adult  Completed   Fall Risk  06/17/2018 04/22/2018 01/22/2017 07/23/2016 04/25/2015  Falls in the past year? - 0 No No No  Number falls in past yr: - 0 - - -  Injury with Fall? - 0 - - -  Risk for fall due to : Impaired vision - - - -   Functional Status Survey:    Vitals:   08/29/20 1128  BP: 122/66  Pulse: 89  Resp: (!) 21  Temp: 97.8 F (36.6 C)  SpO2: 90%  Weight: 143 lb 6.4 oz (65 kg)  Height: 5' (1.524 m)   Body mass index is 28.01 kg/m. Physical Exam Vitals and nursing note reviewed.  Constitutional:      Appearance: Normal appearance.  HENT:     Head: Normocephalic and atraumatic.     Jaw: No swelling or pain on movement.     Salivary Glands: Right salivary gland is not diffusely enlarged or tender. Left salivary gland is not diffusely enlarged or tender.     Mouth/Throat:     Mouth: Mucous membranes are moist.  Eyes:     Extraocular Movements: Extraocular movements intact.     Conjunctiva/sclera:  Conjunctivae normal.     Pupils: Pupils are equal, round, and reactive to light.  Cardiovascular:     Rate and Rhythm: Normal rate and regular rhythm.     Heart sounds: No murmur heard.   Pulmonary:     Effort: Pulmonary effort is normal. No respiratory distress.     Breath sounds: Rales present. No wheezing or rhonchi.     Comments: Posterior mid to lower lung fine rales.  Chest:     Chest wall: No tenderness.  Abdominal:     General: Bowel sounds are normal.  Palpations: Abdomen is soft.     Tenderness: There is no abdominal tenderness.  Musculoskeletal:     Cervical back: Normal range of motion and neck supple.     Right lower leg: No edema.     Left lower leg: No edema.  Skin:    General: Skin is warm and dry.  Neurological:     Mental Status: She is alert. Mental status is at baseline.     Gait: Gait abnormal.     Comments: Oriented to self.   Psychiatric:     Comments: Not following directions as prior.      Labs reviewed: Recent Labs    02/17/20 0000 04/13/20 0000 07/28/20 0000  NA 138 141 141  K 4.1 4.7 4.3  CL 102 102 105  CO2 30* 27* 30*  BUN 12 11 13   CREATININE 0.6 0.7 0.7  CALCIUM 8.4* 9.1 8.7   Recent Labs    02/17/20 0000 04/13/20 0000 07/28/20 0000  AST 13 15 13   ALT 12 12 12   ALKPHOS 63 68 52  ALBUMIN 3.2* 4.0 3.6   Recent Labs    02/17/20 0000 04/13/20 0000 07/28/20 0000  WBC 12.9 9.7 6.6  NEUTROABS 8,811.00 6,431.00 3,109.00  HGB 10.3* 12.2 11.2*  HCT 31* 37 34*  PLT 320 333 251   Lab Results  Component Value Date   TSH 2.73 07/28/2020   Lab Results  Component Value Date   HGBA1C 5.5 07/28/2020   Lab Results  Component Value Date   CHOL 275 (H) 04/06/2018   HDL 52 04/06/2018   LDLCALC 190 (H) 04/06/2018   TRIG 161 (H) 04/06/2018   CHOLHDL 5.3 (H) 04/06/2018    Significant Diagnostic Results in last 30 days:  No results found.  Assessment/Plan Aspiration pneumonia ([place]) 08/28/20 CXR mild patchy right mid,  bilateral lower lung opacities. Augmentin 875mg  bid x 7 days started subsequently, prn DuoNeb initiated. The patient is afebrile, walking on her own, occasional cough, denied chest pain, no O2 desaturation.    GERD (gastroesophageal reflux disease) stable, on Omeprazole 20mg  qd.Hgb11.2 07/28/20   Dysphagia choking episodes in the past.Takes Reglan. Risk for aspiration.Pureed diet.  Vascular dementia without behavioral disturbance ([facility]) takes Memantine 28mg  qd, Rivastigmine 9.5mg /24hr, ASA 81mg  qd, progressing, needs to be fed.  Fall contributory factors are increased frailty and limited safety awareness.   Depression, recurrent ([place]) Her mood is stable, on Escitalopram 10mg  qd.TSH 2.73 07/28/20   Allergic rhinitis Allergic rhinitis, takes Zyrtec 10mg  qd      Family/ staff Communication: plan of care reviewed with the patient and charge nurse.   Labs/tests ordered:  CXR done 08/28/20  Time spend 35 minutes.

## 2020-08-29 NOTE — Assessment & Plan Note (Signed)
takes Memantine 28mg  qd, Rivastigmine 9.5mg /24hr, ASA 81mg  qd, progressing, needs to be fed.

## 2020-08-29 NOTE — Assessment & Plan Note (Signed)
08/28/20 CXR mild patchy right mid, bilateral lower lung opacities. Augmentin 875mg  bid x 7 days started subsequently, prn DuoNeb initiated. The patient is afebrile, walking on her own, occasional cough, denied chest pain, no O2 desaturation.

## 2020-08-29 NOTE — Assessment & Plan Note (Signed)
Allergic rhinitis, takes Zyrtec 10mg  qd

## 2020-08-29 NOTE — Assessment & Plan Note (Signed)
Her mood is stable, on Escitalopram 10mg  qd.TSH 2.73 07/28/20

## 2020-08-29 NOTE — Assessment & Plan Note (Signed)
contributory factors are increased frailty and limited safety awareness.

## 2020-09-21 ENCOUNTER — Encounter: Payer: Self-pay | Admitting: Internal Medicine

## 2020-09-21 ENCOUNTER — Non-Acute Institutional Stay (SKILLED_NURSING_FACILITY): Payer: Medicare PPO | Admitting: Internal Medicine

## 2020-09-21 DIAGNOSIS — R1319 Other dysphagia: Secondary | ICD-10-CM

## 2020-09-21 DIAGNOSIS — E785 Hyperlipidemia, unspecified: Secondary | ICD-10-CM

## 2020-09-21 DIAGNOSIS — K219 Gastro-esophageal reflux disease without esophagitis: Secondary | ICD-10-CM | POA: Diagnosis not present

## 2020-09-21 DIAGNOSIS — F0280 Dementia in other diseases classified elsewhere without behavioral disturbance: Secondary | ICD-10-CM

## 2020-09-21 DIAGNOSIS — F339 Major depressive disorder, recurrent, unspecified: Secondary | ICD-10-CM | POA: Diagnosis not present

## 2020-09-21 DIAGNOSIS — G301 Alzheimer's disease with late onset: Secondary | ICD-10-CM | POA: Diagnosis not present

## 2020-09-21 NOTE — Progress Notes (Signed)
Location:   [facility] Room Number: 3 Place of Service:  SNF [phone]) Provider:  Veleta Miners MD   [name], MD  Patient Care Team: [name], MD as PCP - General (Internal Medicine) Lindwood Coke, MD as Consulting Physician (Dermatology) Christophe Louis, MD as Consulting Physician (Obstetrics and Gynecology) Latanya Maudlin, MD as Consulting Physician (Orthopedic Surgery) Garlan Fair, MD as Consulting Physician (Gastroenterology) Barnett Abu., MD as Consulting Physician (Cardiology) Neldon Mc, Donnamarie Poag, MD as Consulting Physician (Allergy and Immunology) Rexene Alberts, MD as Consulting Physician (Cardiothoracic Surgery) Melida Quitter, MD as Consulting Physician (Otolaryngology) Ngetich, Nelda Bucks, NP as Nurse Practitioner (Family Medicine)  Extended Emergency Contact Information Primary Emergency Contact: Kathie Rhodes of [place] Phone: [phone] Mobile Phone: [phone] Relation: Legal Guardian Secondary Emergency Contact: Leana Gamer States of [place] Phone: [phone] Relation: Uncle  Code Status:  DNR Managed Care Goals of care: Advanced Directive information Advanced Directives 09/21/2020  Does Patient Have a Medical Advance Directive? Yes  Type of Paramedic of [place];Out of facility DNR (pink MOST or yellow form)  Does patient want to make changes to medical advance directive? No - Patient declined  Copy of [place] in Chart? Yes - validated most recent copy scanned in chart (See row information)  Pre-existing out of facility DNR order (yellow form or pink MOST form) Yellow form placed in chart (order not valid for inpatient use);Pink MOST form placed in chart (order not valid for inpatient use)     Chief Complaint  Patient presents with   Medical Management of Chronic Issues   Health Maintenance    Eye exam, Shingrix, urine  microalbumin    HPI:  Pt is a 87 y.o. female seen today for medical management of chronic diseases.    Patient has h/o Hyperlipidemia, Osteoporosis, Allergic Rhinitis and Dementia Alzheimer, Dysphagia  s/p Dilatation 6/20 Has h/o Choking Episodes on Reglan now.  Has episodes of Choking sometimes Speech has worked with her  Patient was treated for Aspiration Pneumonia 4 weeks ago Doing well now No new issues Weight is stable Walks with no assists. Has aphasia. Some behavior issues in the evening but responds to Supportive therapy  Past Medical History:  Diagnosis Date   Abnormal CT scan, chest September 2011   Scar tissue   Allergic rhinitis    Alzheimer's dementia ([facility])    Asthma    Diabetes mellitus    borderline and under control-diet ,exercise   Esophageal stricture    Family history of colon cancer    Mother   GERD (gastroesophageal reflux disease)    Hiatal hernia    HLD (hyperlipidemia)    Hypertension    Osteopenia    Pneumonia 12-15 yrs.ago   yrs. ago   TIA (transient ischemic attack)    Vitamin D deficiency    Past Surgical History:  Procedure Laterality Date   ABDOMINAL HYSTERECTOMY  1980    BALLOON DILATION N/A 09/27/2018   Procedure: BALLOON DILATION;  Surgeon: Jackquline Denmark, MD;  [facility]: [facility];  Service: Endoscopy;  Laterality: N/A;   BIOPSY  09/27/2018   Procedure: BIOPSY;  Surgeon: Jackquline Denmark, MD;  [facility];  Service: Endoscopy;;   CATARACT EXTRACTION Left 2014   ESOPHAGOGASTRODUODENOSCOPY (EGD) WITH PROPOFOL N/A 09/27/2018   Procedure: ESOPHAGOGASTRODUODENOSCOPY (EGD) WITH PROPOFOL;  Surgeon: Jackquline Denmark, MD;  [facility];  Service: Endoscopy;  Laterality: N/A;   SHOULDER  OPEN ROTATOR CUFF REPAIR  05/22/2011   Procedure: ROTATOR CUFF REPAIR SHOULDER OPEN;  Surgeon: Tobi Bastos, MD;  [facility]: [facility];  Service: Orthopedics;  Laterality: Left;   THORACOTOMY / DECORTICATION PARIETAL PLEURA Right 204   empyema    TONSILLECTOMY      Allergies  Allergen Reactions   Aricept [Donepezil Hcl] Diarrhea   Crestor [Rosuvastatin Calcium]     Myalgia   Lipitor [Atorvastatin Calcium]    Lovastatin     Myalgia   Micardis [Telmisartan]     Fatigue   Sertraline Diarrhea   Welchol [Colesevelam Hcl]     Constipation   Zetia [Ezetimibe]     Myalgia   Zocor [Simvastatin]     Myalgia    Allergies as of 09/21/2020       Reactions   Aricept [donepezil Hcl] Diarrhea   Crestor [rosuvastatin Calcium]    Myalgia   Lipitor [atorvastatin Calcium]    Lovastatin    Myalgia   Micardis [telmisartan]    Fatigue   Sertraline Diarrhea   Welchol [colesevelam Hcl]    Constipation   Zetia [ezetimibe]    Myalgia   Zocor [simvastatin]    Myalgia        Medication List        Accurate as of September 21, 2020  3:27 PM. If you have any questions, ask your nurse or doctor.          acetaminophen 325 MG tablet Commonly known as: TYLENOL Take 325 mg by mouth every 6 (six) hours as needed for headache. Take Two Tablets ( 650 ) as needed.   aspirin EC 81 MG tablet Take 81 mg by mouth daily.   Calcium 600-200 MG-UNIT tablet Take 1 tablet by mouth daily. In the morning 7-11 am   cetirizine 10 MG tablet Commonly known as: ZYRTEC Take 10 mg by mouth daily.   CHLORHEXIDINE GLUCONATE MT Use as directed 1 Dose in the mouth or throat daily. 1 tsp of solution to each teeth and gums with a toothbrush. Spit excess and do not rinse.   escitalopram 10 MG tablet Commonly known as: LEXAPRO Take 1 tablet (10 mg total) by mouth daily.   ipratropium-albuterol 0.5-2.5 (3) MG/3ML Soln Commonly known as: DUONEB Take 3 mLs by nebulization every 6 (six) hours as needed.   memantine 28 MG Cp24 24 hr capsule Commonly known as: NAMENDA XR TAKE ONE CAPSULE ONCE A DAY TO PRESERVE MEMORY.   metoCLOPramide 5 MG tablet Commonly known as: REGLAN Take 5 mg by mouth in the morning and at bedtime.   NON FORMULARY Med Pass  2.0 liquid; 2.0; amt: 180ML.; oral Special Instructions: Med Pass 2.0 180ML QD @ 2 PM. Doc. % consumed. Once a day   NON FORMULARY Med Pass 2.0 liquid; 2.0; amt: 180ML.; oral Special Instructions: Med Pass 2.0 180ML QD @ 2 PM. Doc. % consumed. Three times a day   omeprazole 20 MG capsule Commonly known as: PRILOSEC Take 20 mg by mouth daily. Do not crush; Open capsule and sprinkle on applesauce   PreserVision AREDS 2 Chew Chew 1 tablet by mouth in the morning and at bedtime.   PreviDent 5000 Booster Plus 1.1 % Pste Generic drug: Sodium Fluoride Place 1 application onto teeth every evening.   rivastigmine 9.5 mg/24hr Commonly known as: EXELON Apply fresh patch daily and remove old patch to help preserve memory   zinc oxide 20 % ointment Apply 1 application topically as needed for irritation. Apply  to buttocks/peri topical as needed        Review of Systems  Unable to perform ROS: Dementia   Immunization History  Administered Date(s) Administered   Influenza, High Dose Seasonal PF 01/14/2019, 01/04/2020   Influenza,inj,Quad PF,6+ Mos 12/29/2017   Influenza-Unspecified 12/01/2014, 01/11/2016, 01/04/2020   Moderna SARS-COV2 Booster Vaccination 09/06/2020   Moderna Sars-Covid-2 Vaccination 04/05/2019, 05/03/2019, 02/14/2020   Pneumococcal Conjugate-13 11/22/2013   Pneumococcal Polysaccharide-23 07/01/2006   Tdap 12/17/2011   Zoster, Live 12/25/2005   Pertinent  Health Maintenance Due  Topic Date Due   OPHTHALMOLOGY EXAM  Never done   URINE MICROALBUMIN  02/11/2017   INFLUENZA VACCINE  10/30/2020   HEMOGLOBIN A1C  01/27/2021   FOOT EXAM  08/15/2021   DEXA SCAN  Completed   PNA vac Low Risk Adult  Completed   Fall Risk  06/17/2018 04/22/2018 01/22/2017 07/23/2016 04/25/2015  Falls in the past year? - 0 No No No  Number falls in past yr: - 0 - - -  Injury with Fall? - 0 - - -  Risk for fall due to : Impaired vision - - - -   Functional Status Survey:    Vitals:    09/21/20 1520  BP: 139/72  Pulse: 65  Resp: 18  Temp: (!) 96.7 F (35.9 C)  SpO2: 93%  Weight: 140 lb 6.4 oz (63.7 kg)  Height: 5' (1.524 m)   Body mass index is 27.42 kg/m. Physical Exam Constitutional: . Well-developed and well-nourished.  HENT:  Head: Normocephalic.  Mouth/Throat: Oropharynx is clear and moist.  Eyes: Pupils are equal, round, and reactive to light.  Neck: Neck supple.  Cardiovascular: Normal rate and normal heart sounds.  No murmur heard. Pulmonary/Chest: Effort normal and breath sounds normal. No respiratory distress. No wheezes. She has no rales.  Abdominal: Soft. Bowel sounds are normal. No distension. There is no tenderness. There is no rebound.  Musculoskeletal: No edema.  Lymphadenopathy: none Neurological: Has aphasia. No Focal deficits Skin: Skin is warm and dry.  Psychiatric: Normal mood and affect. Behavior is normal. Thought content normal.   Labs reviewed: Recent Labs    02/17/20 0000 04/13/20 0000 07/28/20 0000  NA 138 141 141  K 4.1 4.7 4.3  CL 102 102 105  CO2 30* 27* 30*  BUN 12 11 13   CREATININE 0.6 0.7 0.7  CALCIUM 8.4* 9.1 8.7   Recent Labs    02/17/20 0000 04/13/20 0000 07/28/20 0000  AST 13 15 13   ALT 12 12 12   ALKPHOS 63 68 52  ALBUMIN 3.2* 4.0 3.6   Recent Labs    02/17/20 0000 04/13/20 0000 07/28/20 0000  WBC 12.9 9.7 6.6  NEUTROABS 8,811.00 6,431.00 3,109.00  HGB 10.3* 12.2 11.2*  HCT 31* 37 34*  PLT 320 333 251   Lab Results  Component Value Date   TSH 2.73 07/28/2020   Lab Results  Component Value Date   HGBA1C 5.5 07/28/2020   Lab Results  Component Value Date   CHOL 275 (H) 04/06/2018   HDL 52 04/06/2018   LDLCALC 190 (H) 04/06/2018   TRIG 161 (H) 04/06/2018   CHOLHDL 5.3 (H) 04/06/2018    Significant Diagnostic Results in last 30 days:  No results found.  Assessment/Plan Esophageal dysphagia Continue on Reglan Gastroesophageal reflux disease,  On Prilosec Late onset Alzheimer's  dementia without behavioral disturbance (HCC) Namenda and Exelon Supportive therapy Depression, recurrent ([place]) On Lexapro Hyperlipidemia,  Allergic to statin   Family/ staff Communication:  Labs/tests ordered:

## 2020-09-29 ENCOUNTER — Non-Acute Institutional Stay (INDEPENDENT_AMBULATORY_CARE_PROVIDER_SITE_OTHER): Payer: Medicare PPO | Admitting: Orthopedic Surgery

## 2020-09-29 ENCOUNTER — Encounter: Payer: Self-pay | Admitting: Orthopedic Surgery

## 2020-09-29 DIAGNOSIS — Z0000 Encounter for general adult medical examination without abnormal findings: Secondary | ICD-10-CM

## 2020-09-29 NOTE — Patient Instructions (Signed)
  Ms. Grace Gilbert , Thank you for taking time to come for your Medicare Wellness Visit. I appreciate your ongoing commitment to your health goals. Please review the following plan we discussed and let me know if I can assist you in the future.   These are the goals we discussed:  Goals      DIET - INCREASE WATER INTAKE     Maintaining physical activity     Patient will maintain her stretches.         This is a list of the screening recommended for you and due dates:  Health Maintenance  Topic Date Due   Eye exam for diabetics  Never done   Zoster (Shingles) Vaccine (1 of 2) Never done   Urine Protein Check  02/11/2017   Flu Shot  10/30/2020   Hemoglobin A1C  01/27/2021   Complete foot exam   08/15/2021   Tetanus Vaccine  12/16/2021   DEXA scan (bone density measurement)  Completed   COVID-19 Vaccine  Completed   Pneumonia vaccines  Completed   HPV Vaccine  Aged Out

## 2020-09-29 NOTE — Progress Notes (Signed)
Provider:  Windell Moulding, NP Location:  [facility]   Place of Service:      PCP: [name], MD Patient Care Team: [name], MD as PCP - General (Internal Medicine) Lindwood Coke, MD as Consulting Physician (Dermatology) Christophe Louis, MD as Consulting Physician (Obstetrics and Gynecology) Latanya Maudlin, MD as Consulting Physician (Orthopedic Surgery) Garlan Fair, MD as Consulting Physician (Gastroenterology) Barnett Abu., MD as Consulting Physician (Cardiology) Neldon Mc, Donnamarie Poag, MD as Consulting Physician (Allergy and Immunology) Rexene Alberts, MD as Consulting Physician (Cardiothoracic Surgery) Melida Quitter, MD as Consulting Physician (Otolaryngology) Ngetich, Nelda Bucks, NP as Nurse Practitioner (Family Medicine)  Extended Emergency Contact Information Primary Emergency Contact: Kathie Rhodes of [place] Phone: [phone] Mobile Phone: [phone] Relation: Legal Guardian Secondary Emergency Contact: Leana Gamer States of [place] Phone: [phone] Relation: Uncle  Code Status: DNR Goals of Care: Advanced Directive information Advanced Directives 09/29/2020  Does Patient Have a Medical Advance Directive? Yes  Type of Paramedic of [place];Out of facility DNR (pink MOST or yellow form)  Does patient want to make changes to medical advance directive? No - Patient declined  Copy of [place] in Chart? Yes - validated most recent copy scanned in chart (See row information)  Pre-existing out of facility DNR order (yellow form or pink MOST form) -     Chief Complaint  Patient presents with   Annual Exam    Annual wellness exam     HPI: Patient is a 87 y.o. female seen today for an annual comprehensive examination.  Past Medical History:  Diagnosis Date   Abnormal CT scan, chest September 2011   Scar tissue   Allergic rhinitis    Alzheimer's dementia  ([place])    Asthma    Diabetes mellitus    borderline and under control-diet ,exercise   Esophageal stricture    Family history of colon cancer    Mother   GERD (gastroesophageal reflux disease)    Hiatal hernia    HLD (hyperlipidemia)    Hypertension    Osteopenia    Pneumonia 12-15 yrs.ago   yrs. ago   TIA (transient ischemic attack)    Vitamin D deficiency    Past Surgical History:  Procedure Laterality Date   ABDOMINAL HYSTERECTOMY  1980    BALLOON DILATION N/A 09/27/2018   Procedure: BALLOON DILATION;  Surgeon: Jackquline Denmark, MD;  [facility];  Service: Endoscopy;  Laterality: N/A;   BIOPSY  09/27/2018   Procedure: BIOPSY;  Surgeon: Jackquline Denmark, MD;  [facility];  Service: Endoscopy;;   CATARACT EXTRACTION Left 2014   ESOPHAGOGASTRODUODENOSCOPY (EGD) WITH PROPOFOL N/A 09/27/2018   Procedure: ESOPHAGOGASTRODUODENOSCOPY (EGD) WITH PROPOFOL;  Surgeon: Jackquline Denmark, MD;  [facility];  Service: Endoscopy;  Laterality: N/A;   SHOULDER OPEN ROTATOR CUFF REPAIR  05/22/2011   Procedure: ROTATOR CUFF REPAIR SHOULDER OPEN;  Surgeon: Tobi Bastos, MD;  [facility];  Service: Orthopedics;  Laterality: Left;   THORACOTOMY / DECORTICATION PARIETAL PLEURA Right 204   empyema   TONSILLECTOMY      reports that she quit smoking about 33 years ago. Her smoking use included cigarettes. She has a 30.00 pack-year smoking history. She has never used smokeless tobacco. She reports that she does not drink alcohol and does not use drugs. Social History   Socioeconomic History   Marital status: Single    Spouse name: Not on file  Number of children: Not on file   Years of education: Not on file   Highest education level: Not on file  Occupational History   Occupation: retired Pharmacist, hospital  Tobacco Use   Smoking status: Former    Packs/day: 1.50    Years: 20.00    Pack years: 30.00    Types: Cigarettes    Quit date: 05/15/1987    Years since quitting: 33.4    Smokeless tobacco: Never  Vaping Use   Vaping Use: Never used  Substance and Sexual Activity   Alcohol use: No    Alcohol/week: 1.0 standard drink    Types: 1 Glasses of wine per week   Drug use: No   Sexual activity: Never  Other Topics Concern   Not on file  Social History Narrative   Lives at [facility] since 07/27/2014   Previous math Pharmacist, hospital.   Never married   Former smoker stopped 1989   Alcohol occasionally    POA niece Ronalee Belts, [place]   Social Determinants of Health   Financial Resource Strain: Not on file  Food Insecurity: Not on file  Transportation Needs: Not on file  Physical Activity: Not on file  Stress: Not on file  Social Connections: Not on file  Intimate Partner Violence: Not on file   History reviewed. No pertinent family history.  Pertinent  Health Maintenance Due  Topic Date Due   OPHTHALMOLOGY EXAM  Never done   URINE MICROALBUMIN  02/11/2017   INFLUENZA VACCINE  10/30/2020   HEMOGLOBIN A1C  01/27/2021   FOOT EXAM  08/15/2021   DEXA SCAN  Completed   PNA vac Low Risk Adult  Completed   Fall Risk  06/17/2018 04/22/2018 01/22/2017 07/23/2016 04/25/2015  Falls in the past year? - 0 No No No  Number falls in past yr: - 0 - - -  Injury with Fall? - 0 - - -  Risk for fall due to : Impaired vision - - - -   Depression screen [facility] 2/9 04/22/2018 01/22/2017 07/23/2016 03/30/2015 03/13/2015  Decreased Interest 0 0 0 0 0  Down, Depressed, Hopeless 0 0 0 0 0  PHQ - 2 Score 0 0 0 0 0    Functional Status Survey:    Allergies  Allergen Reactions   Aricept [Donepezil Hcl] Diarrhea   Crestor [Rosuvastatin Calcium]     Myalgia   Lipitor [Atorvastatin Calcium]    Lovastatin     Myalgia   Micardis [Telmisartan]     Fatigue   Sertraline Diarrhea   Welchol [Colesevelam Hcl]     Constipation   Zetia [Ezetimibe]     Myalgia   Zocor [Simvastatin]     Myalgia    Allergies as of 09/29/2020       Reactions   Aricept [donepezil Hcl]  Diarrhea   Crestor [rosuvastatin Calcium]    Myalgia   Lipitor [atorvastatin Calcium]    Lovastatin    Myalgia   Micardis [telmisartan]    Fatigue   Sertraline Diarrhea   Welchol [colesevelam Hcl]    Constipation   Zetia [ezetimibe]    Myalgia   Zocor [simvastatin]    Myalgia        Medication List        Accurate as of September 29, 2020  2:30 PM. If you have any questions, ask your nurse or doctor.          acetaminophen 325 MG tablet Commonly known as: TYLENOL Take 325 mg by  mouth every 6 (six) hours as needed for headache. Take Two Tablets ( 650 ) as needed.   aspirin EC 81 MG tablet Take 81 mg by mouth daily.   Calcium 600-200 MG-UNIT tablet Take 1 tablet by mouth daily. In the morning 7-11 am   cetirizine 10 MG tablet Commonly known as: ZYRTEC Take 10 mg by mouth daily.   CHLORHEXIDINE GLUCONATE MT Use as directed 1 Dose in the mouth or throat daily. 1 tsp of solution to each teeth and gums with a toothbrush. Spit excess and do not rinse.   escitalopram 10 MG tablet Commonly known as: LEXAPRO Take 1 tablet (10 mg total) by mouth daily.   ipratropium-albuterol 0.5-2.5 (3) MG/3ML Soln Commonly known as: DUONEB Take 3 mLs by nebulization every 6 (six) hours as needed.   memantine 28 MG Cp24 24 hr capsule Commonly known as: NAMENDA XR TAKE ONE CAPSULE ONCE A DAY TO PRESERVE MEMORY.   metoCLOPramide 5 MG tablet Commonly known as: REGLAN Take 5 mg by mouth in the morning and at bedtime.   NON FORMULARY Med Pass 2.0 liquid; 2.0; amt: 180ML.; oral Special Instructions: Med Pass 2.0 180ML QD @ 2 PM. Doc. % consumed. Once a day   NON FORMULARY Med Pass 2.0 liquid; 2.0; amt: 180ML.; oral Special Instructions: Med Pass 2.0 180ML QD @ 2 PM. Doc. % consumed. Three times a day   omeprazole 20 MG capsule Commonly known as: PRILOSEC Take 20 mg by mouth daily. Do not crush; Open capsule and sprinkle on applesauce   PreserVision AREDS 2 Chew Chew 1 tablet by  mouth in the morning and at bedtime.   PreviDent 5000 Booster Plus 1.1 % Pste Generic drug: Sodium Fluoride Place 1 application onto teeth every evening.   rivastigmine 9.5 mg/24hr Commonly known as: EXELON Apply fresh patch daily and remove old patch to help preserve memory   zinc oxide 20 % ointment Apply 1 application topically as needed for irritation. Apply to buttocks/peri topical as needed        Review of Systems  Vitals:   09/29/20 1425  BP: 126/65  Pulse: 72  Resp: 20  Temp: 97.9 F (36.6 C)  SpO2: 94%  Weight: 139 lb (63 kg)  Height: 5' (1.524 m)   Body mass index is 27.15 kg/m. Physical Exam  Labs reviewed: Basic Metabolic Panel: Recent Labs    02/17/20 0000 04/13/20 0000 07/28/20 0000  NA 138 141 141  K 4.1 4.7 4.3  CL 102 102 105  CO2 30* 27* 30*  BUN 12 11 13   CREATININE 0.6 0.7 0.7  CALCIUM 8.4* 9.1 8.7   Liver Function Tests: Recent Labs    02/17/20 0000 04/13/20 0000 07/28/20 0000  AST 13 15 13   ALT 12 12 12   ALKPHOS 63 68 52  ALBUMIN 3.2* 4.0 3.6   No results for input(s): LIPASE, AMYLASE in the last 8760 hours. No results for input(s): AMMONIA in the last 8760 hours. CBC: Recent Labs    02/17/20 0000 04/13/20 0000 07/28/20 0000  WBC 12.9 9.7 6.6  NEUTROABS 8,811.00 6,431.00 3,109.00  HGB 10.3* 12.2 11.2*  HCT 31* 37 34*  PLT 320 333 251   Cardiac Enzymes: No results for input(s): CKTOTAL, CKMB, CKMBINDEX, TROPONINI in the last 8760 hours. BNP: Invalid input(s): POCBNP Lab Results  Component Value Date   HGBA1C 5.5 07/28/2020   Lab Results  Component Value Date   TSH 2.73 07/28/2020   Lab Results  Component Value Date  YKDXIPJA25 440 01/08/2019   No results found for: FOLATE Lab Results  Component Value Date   KNLZJQBH 419 (H) 09/24/2018    Imaging and Procedures obtained recently: No results found.  Assessment/Plan There are no diagnoses linked to this encounter.   Family/ staff Communication:    Labs/tests ordered:

## 2020-09-29 NOTE — Progress Notes (Signed)
Subjective:   Grace Gilbert is a 87 y.o. female who presents for Medicare Annual (Subsequent) preventive examination.  Review of Systems     Cardiac Risk Factors include: advanced age (>37men, >32 women);hypertension     Objective:    Today's Vitals   09/29/20 1425  BP: 126/65  Pulse: 72  Resp: 20  Temp: 97.9 F (36.6 C)  SpO2: 94%  Weight: 139 lb (63 kg)  Height: 5' (1.524 m)   Body mass index is 27.15 kg/m.  Advanced Directives 09/29/2020 09/29/2020 09/21/2020 08/15/2020 07/24/2020 04/03/2020 03/30/2020  Does Patient Have a Medical Advance Directive? Yes Yes Yes Yes Yes Yes Yes  Type of Paramedic of [place];Out of facility DNR (pink MOST or yellow form) [place];Out of facility DNR (pink MOST or yellow form) [place];Out of facility DNR (pink MOST or yellow form) [place];Out of facility DNR (pink MOST or yellow form) Healthcare Power of [place];Out of facility DNR (pink MOST or yellow form) Out of facility DNR (pink MOST or yellow form);Healthcare Power of Attorney  Does patient want to make changes to medical advance directive? No - Patient declined No - Patient declined No - Patient declined No - Patient declined No - Patient declined No - Patient declined No - Patient declined  Copy of Friend in Chart? Yes - validated most recent copy scanned in chart (See row information) Yes - validated most recent copy scanned in chart (See row information) Yes - validated most recent copy scanned in chart (See row information) Yes - validated most recent copy scanned in chart (See row information) Yes - validated most recent copy scanned in chart (See row information) Yes - validated most recent copy scanned in chart (See row information) Yes - validated most recent copy scanned in chart (See row information)  Pre-existing out of facility DNR order (yellow form  or pink MOST form) - - Yellow form placed in chart (order not valid for inpatient use);Pink MOST form placed in chart (order not valid for inpatient use) Yellow form placed in chart (order not valid for inpatient use);Pink MOST form placed in chart (order not valid for inpatient use) - Pink MOST form placed in chart (order not valid for inpatient use) Pink MOST form placed in chart (order not valid for inpatient use)    Current Medications (verified) Outpatient Encounter Medications as of 09/29/2020  Medication Sig   acetaminophen (TYLENOL) 325 MG tablet Take 325 mg by mouth every 6 (six) hours as needed for headache. Take Two Tablets ( 650 ) as needed.   aspirin EC 81 MG tablet Take 81 mg by mouth daily.   Calcium 600-200 MG-UNIT tablet Take 1 tablet by mouth daily. In the morning 7-11 am   cetirizine (ZYRTEC) 10 MG tablet Take 10 mg by mouth daily.   CHLORHEXIDINE GLUCONATE MT Use as directed 1 Dose in the mouth or throat daily. 1 tsp of solution to each teeth and gums with a toothbrush. Spit excess and do not rinse.   escitalopram (LEXAPRO) 10 MG tablet Take 1 tablet (10 mg total) by mouth daily.   ipratropium-albuterol (DUONEB) 0.5-2.5 (3) MG/3ML SOLN Take 3 mLs by nebulization every 6 (six) hours as needed.   memantine (NAMENDA XR) 28 MG CP24 24 hr capsule TAKE ONE CAPSULE ONCE A DAY TO PRESERVE MEMORY.   metoCLOPramide (REGLAN) 5 MG tablet Take 5 mg by mouth in the  morning and at bedtime.   Multiple Vitamins-Minerals (PRESERVISION AREDS 2) CHEW Chew 1 tablet by mouth in the morning and at bedtime.   NON FORMULARY Med Pass 2.0 liquid; 2.0; amt: 180ML.; oral Special Instructions: Med Pass 2.0 180ML QD @ 2 PM. Doc. % consumed. Once a day   NON FORMULARY Med Pass 2.0 liquid; 2.0; amt: 180ML.; oral Special Instructions: Med Pass 2.0 180ML QD @ 2 PM. Doc. % consumed. Three times a day   omeprazole (PRILOSEC) 20 MG capsule Take 20 mg by mouth daily. Do not crush; Open capsule and sprinkle on  applesauce   rivastigmine (EXELON) 9.5 mg/24hr Apply fresh patch daily and remove old patch to help preserve memory   Sodium Fluoride (PREVIDENT 5000 BOOSTER PLUS) 1.1 % PSTE Place 1 application onto teeth every evening.   zinc oxide 20 % ointment Apply 1 application topically as needed for irritation. Apply to buttocks/peri topical as needed   No facility-administered encounter medications on file as of 09/29/2020.    Allergies (verified) Aricept [donepezil hcl], Crestor [rosuvastatin calcium], Lipitor [atorvastatin calcium], Lovastatin, Micardis [telmisartan], Sertraline, Welchol [colesevelam hcl], Zetia [ezetimibe], and Zocor [simvastatin]   History: Past Medical History:  Diagnosis Date   Abnormal CT scan, chest September 2011   Scar tissue   Allergic rhinitis    Alzheimer's dementia ([place])    Asthma    Diabetes mellitus    borderline and under control-diet ,exercise   Esophageal stricture    Family history of colon cancer    Mother   GERD (gastroesophageal reflux disease)    Hiatal hernia    HLD (hyperlipidemia)    Hypertension    Osteopenia    Pneumonia 12-15 yrs.ago   yrs. ago   TIA (transient ischemic attack)    Vitamin D deficiency    Past Surgical History:  Procedure Laterality Date   ABDOMINAL HYSTERECTOMY  1980    BALLOON DILATION N/A 09/27/2018   Procedure: BALLOON DILATION;  Surgeon: Jackquline Denmark, MD;  Location: [facility];  Service: Endoscopy;  Laterality: N/A;   BIOPSY  09/27/2018   Procedure: BIOPSY;  Surgeon: Jackquline Denmark, MD;  Location: [facility];  Service: Endoscopy;;   CATARACT EXTRACTION Left 2014   ESOPHAGOGASTRODUODENOSCOPY (EGD) WITH PROPOFOL N/A 09/27/2018   Procedure: ESOPHAGOGASTRODUODENOSCOPY (EGD) WITH PROPOFOL;  Surgeon: Jackquline Denmark, MD;  Location: [facility];  Service: Endoscopy;  Laterality: N/A;   SHOULDER OPEN ROTATOR CUFF REPAIR  05/22/2011   Procedure: ROTATOR CUFF REPAIR SHOULDER OPEN;  Surgeon: Tobi Bastos, MD;  Location: [facility];  Service: Orthopedics;  Laterality: Left;   THORACOTOMY / DECORTICATION PARIETAL PLEURA Right 204   empyema   TONSILLECTOMY     History reviewed. No pertinent family history. Social History   Socioeconomic History   Marital status: Single    Spouse name: Not on file   Number of children: Not on file   Years of education: Not on file   Highest education level: Not on file  Occupational History   Occupation: retired Pharmacist, hospital  Tobacco Use   Smoking status: Former    Packs/day: 1.50    Years: 20.00    Pack years: 30.00    Types: Cigarettes    Quit date: 05/15/1987    Years since quitting: 33.4   Smokeless tobacco: Never  Vaping Use   Vaping Use: Never used  Substance and Sexual Activity   Alcohol use: No    Alcohol/week: 1.0 standard drink    Types: 1 Glasses of wine per week  Drug use: No   Sexual activity: Never  Other Topics Concern   Not on file  Social History Narrative   Lives at [facility] since 07/27/2014   Previous math Pharmacist, hospital.   Never married   Former smoker stopped 1989   Alcohol occasionally    POA niece Ronalee Belts, [place]   Social Determinants of Health   Financial Resource Strain: Not on file  Food Insecurity: Not on file  Transportation Needs: Not on file  Physical Activity: Not on file  Stress: Not on file  Social Connections: Not on file    Tobacco Counseling Counseling given: Not Answered   Clinical Intake:  Pre-visit preparation completed: Yes  Pain : No/denies pain BMI - recorded: 27.15 Nutritional Status: BMI 25 -29 Overweight Nutritional Risks: None Diabetes: No  How often do you need to have someone help you when you read instructions, pamphlets, or other written materials from your doctor or pharmacy?: 5 - Always What is the last grade level you completed in school?: She cannot recall  Diabetic?No  Interpreter Needed?: No      Activities of Daily Living In your present state of health, do you have  any difficulty performing the following activities: 09/29/2020  Hearing? N  Vision? N  Difficulty concentrating or making decisions? Y  Walking or climbing stairs? N  Dressing or bathing? Y  Comment requires assistance  Doing errands, shopping? Y  Preparing Food and eating ? Y  Using the Toilet? Y  In the past six months, have you accidently leaked urine? Y  Do you have problems with loss of bowel control? Y  Managing your Medications? Y  Managing your Finances? Y  Housekeeping or managing your Housekeeping? Y  Some recent data might be hidden    Patient Care Team: [name], MD as PCP - General (Internal Medicine) Lindwood Coke, MD as Consulting Physician (Dermatology) Christophe Louis, MD as Consulting Physician (Obstetrics and Gynecology) Latanya Maudlin, MD as Consulting Physician (Orthopedic Surgery) Garlan Fair, MD as Consulting Physician (Gastroenterology) Barnett Abu., MD as Consulting Physician (Cardiology) Neldon Mc Donnamarie Poag, MD as Consulting Physician (Allergy and Immunology) Rexene Alberts, MD as Consulting Physician (Cardiothoracic Surgery) Melida Quitter, MD as Consulting Physician (Otolaryngology) Ngetich, Nelda Bucks, NP as Nurse Practitioner (Family Medicine)  Indicate any recent Medical Services you may have received from other than [facility] providers in the past year (date may be approximate).     Assessment:   This is a routine wellness examination for Lamyia.  Hearing/Vision screen No results found.  Dietary issues and exercise activities discussed: Current Exercise Habits: The patient does not participate in regular exercise at present, Exercise limited by: neurologic condition(s)   Goals Addressed             This Visit's Progress    DIET - INCREASE WATER INTAKE   Not on track    Maintaining physical activity   On track    Patient will maintain her stretches.        Depression Screen PHQ 2/9 Scores 09/29/2020 04/22/2018 01/22/2017 07/23/2016  03/30/2015 03/13/2015 03/07/2015  PHQ - 2 Score 0 0 0 0 0 0 1    Fall Risk Fall Risk  09/29/2020 06/17/2018 04/22/2018 01/22/2017 07/23/2016  Falls in the past year? 1 - 0 No No  Number falls in past yr: 1 - 0 - -  Injury with Fall? 0 - 0 - -  Risk for fall due to : History of fall(s);Impaired  balance/gait;Impaired mobility;Impaired vision;Mental status change Impaired vision - - -  Follow up Falls evaluation completed;Education provided;Falls prevention discussed - - - -    FALL RISK PREVENTION PERTAINING TO THE HOME:  Any stairs in or around the home? No  If so, are there any without handrails? No  Home free of loose throw rugs in walkways, pet beds, electrical cords, etc? Yes  Adequate lighting in your home to reduce risk of falls? Yes   ASSISTIVE DEVICES UTILIZED TO PREVENT FALLS:  Life alert? No  Use of a cane, walker or w/c? No  Grab bars in the bathroom? Yes  Shower chair or bench in shower? Yes  Elevated toilet seat or a handicapped toilet? Yes   TIMED UP AND GO:  Was the test performed? No .  Length of time to ambulate 10 feet:  sec.   Gait slow and steady without use of assistive device  Cognitive Function: MMSE - Mini Mental State Exam 09/29/2020 06/02/2017 03/05/2017 07/02/2016 07/02/2016  Not completed: Unable to complete Unable to complete - - -  Orientation to time - 0 1 1 1   Orientation to Place - 5 5 3 3   Registration - 0 3 3 3   Attention/ Calculation - 0 0 0 0  Recall - 0 0 1 1  Language- name 2 objects - 1 2 1 1   Language- repeat - 0 0 0 0  Language- follow 3 step command - 3 3 3 3   Language- read & follow direction - 1 1 0 0  Write a sentence - 1 0 1 1  Copy design - 0 0 0 0  Total score - 11 15 13 13         Immunizations Immunization History  Administered Date(s) Administered   Influenza, High Dose Seasonal PF 01/14/2019, 01/04/2020   Influenza,inj,Quad PF,6+ Mos 12/29/2017   Influenza-Unspecified 12/01/2014, 01/11/2016, 01/04/2020   Moderna SARS-COV2  Booster Vaccination 09/06/2020   Moderna Sars-Covid-2 Vaccination 04/05/2019, 05/03/2019, 02/14/2020   Pneumococcal Conjugate-13 11/22/2013   Pneumococcal Polysaccharide-23 07/01/2006   Tdap 12/17/2011   Zoster, Live 12/25/2005    TDAP status: Up to date  Flu Vaccine status: Up to date  Pneumococcal vaccine status: Up to date  Covid-19 vaccine status: Completed vaccines  Qualifies for Shingles Vaccine? Yes   Zostavax completed Yes   Shingrix Completed?: No.    Education has been provided regarding the importance of this vaccine. Patient has been advised to call insurance company to determine out of pocket expense if they have not yet received this vaccine. Advised may also receive vaccine at local pharmacy or Health Dept. Verbalized acceptance and understanding.  Screening Tests Health Maintenance  Topic Date Due   OPHTHALMOLOGY EXAM  Never done   Zoster Vaccines- Shingrix (1 of 2) Never done   URINE MICROALBUMIN  02/11/2017   INFLUENZA VACCINE  10/30/2020   HEMOGLOBIN A1C  01/27/2021   FOOT EXAM  08/15/2021   TETANUS/TDAP  12/16/2021   DEXA SCAN  Completed   COVID-19 Vaccine  Completed   PNA vac Low Risk Adult  Completed   HPV VACCINES  Aged Out    Health Maintenance  Health Maintenance Due  Topic Date Due   OPHTHALMOLOGY EXAM  Never done   Zoster Vaccines- Shingrix (1 of 2) Never done   URINE MICROALBUMIN  02/11/2017    Colorectal cancer screening: No longer required.   Mammogram status: No longer required due to advanced age, advanced Alzheimer's.  Bone Density status: Completed 2018. Results  reflect: Bone density results: OSTEOPOROSIS. Repeat every 2 years.  Lung Cancer Screening: (Low Dose CT Chest recommended if Age 52-80 years, 30 pack-year currently smoking OR have quit w/in 15years.) does not qualify.   Lung Cancer Screening Referral: No  Additional Screening:  Hepatitis C Screening: does not qualify; Completed    Vision Screening: Recommended  annual ophthalmology exams for early detection of glaucoma and other disorders of the eye. Is the patient up to date with their annual eye exam?  No  Who is the provider or what is the name of the office in which the patient attends annual eye exams? N/A If pt is not established with a provider, would they like to be referred to a provider to establish care? No .   Dental Screening: Recommended annual dental exams for proper oral hygiene  Community Resource Referral / Chronic Care Management: CRR required this visit?  No   CCM required this visit?  No      Plan:     I have personally reviewed and noted the following in the patient's chart:   Medical and social history Use of alcohol, tobacco or illicit drugs  Current medications and supplements including opioid prescriptions.  Functional ability and status Nutritional status Physical activity Advanced directives List of other physicians Hospitalizations, surgeries, and ER visits in previous 12 months Vitals Screenings to include cognitive, depression, and falls Referrals and appointments  In addition, I have reviewed and discussed with patient certain preventive protocols, quality metrics, and best practice recommendations. A written personalized care plan for preventive services as well as general preventive health recommendations were provided to patient.     [name], NP   09/29/2020

## 2020-10-13 ENCOUNTER — Non-Acute Institutional Stay (SKILLED_NURSING_FACILITY): Payer: Medicare PPO | Admitting: Orthopedic Surgery

## 2020-10-13 ENCOUNTER — Encounter: Payer: Self-pay | Admitting: Orthopedic Surgery

## 2020-10-13 DIAGNOSIS — G301 Alzheimer's disease with late onset: Secondary | ICD-10-CM | POA: Diagnosis not present

## 2020-10-13 DIAGNOSIS — M810 Age-related osteoporosis without current pathological fracture: Secondary | ICD-10-CM

## 2020-10-13 DIAGNOSIS — R1319 Other dysphagia: Secondary | ICD-10-CM | POA: Diagnosis not present

## 2020-10-13 DIAGNOSIS — F339 Major depressive disorder, recurrent, unspecified: Secondary | ICD-10-CM | POA: Diagnosis not present

## 2020-10-13 DIAGNOSIS — F0280 Dementia in other diseases classified elsewhere without behavioral disturbance: Secondary | ICD-10-CM

## 2020-10-13 DIAGNOSIS — K219 Gastro-esophageal reflux disease without esophagitis: Secondary | ICD-10-CM

## 2020-10-13 NOTE — Progress Notes (Signed)
Location:   [place] Room Number: [place] of Service:  SNF [phone]) Provider:  Windell Moulding, NP    Patient Care Team: [name], MD as PCP - General (Internal Medicine) Lindwood Coke, MD as Consulting Physician (Dermatology) Christophe Louis, MD as Consulting Physician (Obstetrics and Gynecology) Latanya Maudlin, MD as Consulting Physician (Orthopedic Surgery) Garlan Fair, MD as Consulting Physician (Gastroenterology) Barnett Abu., MD as Consulting Physician (Cardiology) Neldon Mc Donnamarie Poag, MD as Consulting Physician (Allergy and Immunology) Rexene Alberts, MD as Consulting Physician (Cardiothoracic Surgery) Melida Quitter, MD as Consulting Physician (Otolaryngology) Ngetich, Nelda Bucks, NP as Nurse Practitioner (Family Medicine)  Extended Emergency Contact Information Primary Emergency Contact: Kathie Rhodes of [place] Phone: [phone] Mobile Phone: [phone] Relation: Legal Guardian Secondary Emergency Contact: Leana Gamer [place] of [place] Phone: [phone] Relation: Uncle  Code Status:  DNR Goals of care: Advanced Directive information Advanced Directives 10/13/2020  Does Patient Have a Medical Advance Directive? Yes  Type of Paramedic of [place];Out of facility DNR (pink MOST or yellow form)  Does patient want to make changes to medical advance directive? No - Patient declined  Copy of [place] in Chart? Yes - validated most recent copy scanned in chart (See row information)  Pre-existing out of facility DNR order (yellow form or pink MOST form) Yellow form placed in chart (order not valid for inpatient use);Pink MOST form placed in chart (order not valid for inpatient use)     Chief Complaint  Patient presents with   Medical Management of Chronic Issues    Routine follow up   Health Maintenance    Discuss need for ophthalmology exam, urine  microalbumin, and shingles vaccine.    HPI:  Pt is a 87 y.o. female seen today for medical management of chronic diseases.    She currently resides on the skilled nursing unit at [facility] Past medical history includes: hypertension, dysphagia, GERD, Alzheimer's disease, osteoporosis, thrombocytosis, unsteady gait and frequent falls.   Alzheimers, no recent behavioral outbursts, remains dependent for all ADL's, aphasia present, walks with walker, no recent falls, Exelon patch, Namenda daily.  Dysphagia, aspiration pneumonia 08/29/2020, continues to have intermittent chocking episodes, she is supervised with some meals, remains on pureed diet with nectar thick liquids, Reglan daily GERD, Prilosec 20 mg daily, hgb 11.2 07/27/2020 Depression, lexapro daily Osteoporosis, remains on calcium, DEXA 2018   Recent blood pressures:  07/12- 124/68  07/05- 134/79  06/28- 126/65  Recent weights:  07/01- 139 lbs  06/01- 140.4 lbs  05/03- 143.4 lbs  Nurse does not report any concerns, vitals stable.   Past Medical History:  Diagnosis Date   Abnormal CT scan, chest September 2011   Scar tissue   Allergic rhinitis    Alzheimer's dementia ([place])    Asthma    Diabetes mellitus    borderline and under control-diet ,exercise   Esophageal stricture    Family history of colon cancer    Mother   GERD (gastroesophageal reflux disease)    Hiatal hernia    HLD (hyperlipidemia)    Hypertension    Osteopenia    Pneumonia 12-15 yrs.ago   yrs. ago   TIA (transient ischemic attack)    Vitamin D deficiency    Past Surgical History:  Procedure Laterality Date   ABDOMINAL HYSTERECTOMY  1980    BALLOON DILATION N/A 09/27/2018   Procedure: BALLOON DILATION;  Surgeon: Jackquline Denmark, MD;  Location: [facility];  Service: Endoscopy;  Laterality: N/A;   BIOPSY  09/27/2018   Procedure: BIOPSY;  Surgeon: Jackquline Denmark, MD;  [facility]: [facility];  Service: Endoscopy;;   CATARACT EXTRACTION Left  2014   ESOPHAGOGASTRODUODENOSCOPY (EGD) WITH PROPOFOL N/A 09/27/2018   Procedure: ESOPHAGOGASTRODUODENOSCOPY (EGD) WITH PROPOFOL;  Surgeon: Jackquline Denmark, MD;  [facility];  Service: Endoscopy;  Laterality: N/A;   SHOULDER OPEN ROTATOR CUFF REPAIR  05/22/2011   Procedure: ROTATOR CUFF REPAIR SHOULDER OPEN;  Surgeon: Tobi Bastos, MD;  Location: [facility];  Service: Orthopedics;  Laterality: Left;   THORACOTOMY / DECORTICATION PARIETAL PLEURA Right 204   empyema   TONSILLECTOMY      Allergies  Allergen Reactions   Aricept [Donepezil Hcl] Diarrhea   Crestor [Rosuvastatin Calcium]     Myalgia   Lipitor [Atorvastatin Calcium]    Lovastatin     Myalgia   Micardis [Telmisartan]     Fatigue   Sertraline Diarrhea   Welchol [Colesevelam Hcl]     Constipation   Zetia [Ezetimibe]     Myalgia   Zocor [Simvastatin]     Myalgia    Allergies as of 10/13/2020       Reactions   Aricept [donepezil Hcl] Diarrhea   Crestor [rosuvastatin Calcium]    Myalgia   Lipitor [atorvastatin Calcium]    Lovastatin    Myalgia   Micardis [telmisartan]    Fatigue   Sertraline Diarrhea   Welchol [colesevelam Hcl]    Constipation   Zetia [ezetimibe]    Myalgia   Zocor [simvastatin]    Myalgia        Medication List        Accurate as of October 13, 2020  1:49 PM. If you have any questions, ask your nurse or doctor.          acetaminophen 325 MG tablet Commonly known as: TYLENOL Take 325 mg by mouth every 6 (six) hours as needed for headache. Take Two Tablets ( 650 ) as needed.   aspirin EC 81 MG tablet Take 81 mg by mouth daily.   Calcium 600-200 MG-UNIT tablet Take 1 tablet by mouth daily. In the morning 7-11 am   cetirizine 10 MG tablet Commonly known as: ZYRTEC Take 10 mg by mouth daily.   CHLORHEXIDINE GLUCONATE MT Use as directed 1 Dose in the mouth or throat daily. 1 tsp of solution to each teeth and gums with a toothbrush. Spit excess and do not rinse.    escitalopram 10 MG tablet Commonly known as: LEXAPRO Take 1 tablet (10 mg total) by mouth daily.   ipratropium-albuterol 0.5-2.5 (3) MG/3ML Soln Commonly known as: DUONEB Take 3 mLs by nebulization every 6 (six) hours as needed.   memantine 28 MG Cp24 24 hr capsule Commonly known as: NAMENDA XR TAKE ONE CAPSULE ONCE A DAY TO PRESERVE MEMORY.   metoCLOPramide 5 MG tablet Commonly known as: REGLAN Take 5 mg by mouth in the morning and at bedtime.   NON FORMULARY Med Pass 2.0 liquid; 2.0; amt: 180ML.; oral Special Instructions: Med Pass 2.0 180ML QD @ 2 PM. Doc. % consumed. Once a day   NON FORMULARY Med Pass 2.0 liquid; 2.0; amt: 180ML.; oral Special Instructions: Med Pass 2.0 180ML QD @ 2 PM. Doc. % consumed. Three times a day   omeprazole 20 MG capsule Commonly known as: PRILOSEC Take 20 mg by mouth daily. Do not crush; Open capsule and sprinkle on applesauce   PreserVision AREDS 2  Chew Chew 1 tablet by mouth in the morning and at bedtime.   PreviDent 5000 Booster Plus 1.1 % Pste Generic drug: Sodium Fluoride Place 1 application onto teeth every evening.   rivastigmine 9.5 mg/24hr Commonly known as: EXELON Apply fresh patch daily and remove old patch to help preserve memory   zinc oxide 20 % ointment Apply 1 application topically as needed for irritation. Apply to buttocks/peri topical as needed        Review of Systems  Unable to perform ROS: Dementia   Immunization History  Administered Date(s) Administered   Influenza, High Dose Seasonal PF 01/14/2019, 01/04/2020   Influenza,inj,Quad PF,6+ Mos 12/29/2017   Influenza-Unspecified 12/01/2014, 01/11/2016, 01/04/2020   Moderna SARS-COV2 Booster Vaccination 09/06/2020   Moderna Sars-Covid-2 Vaccination 04/05/2019, 05/03/2019, 02/14/2020   Pneumococcal Conjugate-13 11/22/2013   Pneumococcal Polysaccharide-23 07/01/2006   Tdap 12/17/2011   Zoster, Live 12/25/2005   Pertinent  Health Maintenance Due  Topic  Date Due   OPHTHALMOLOGY EXAM  Never done   URINE MICROALBUMIN  02/11/2017   INFLUENZA VACCINE  10/30/2020   HEMOGLOBIN A1C  01/27/2021   FOOT EXAM  08/15/2021   DEXA SCAN  Completed   PNA vac Low Risk Adult  Completed   Fall Risk  09/29/2020 06/17/2018 04/22/2018 01/22/2017 07/23/2016  Falls in the past year? 1 - 0 No No  Number falls in past yr: 1 - 0 - -  Injury with Fall? 0 - 0 - -  Risk for fall due to : History of fall(s);Impaired balance/gait;Impaired mobility;Impaired vision;Mental status change Impaired vision - - -  Follow up Falls evaluation completed;Education provided;Falls prevention discussed - - - -   Functional Status Survey:    Vitals:   10/13/20 1346  BP: 124/68  Pulse: 75  Resp: 18  Temp: (!) 97.3 F (36.3 C)  SpO2: 96%  Weight: 139 lb (63 kg)  Height: 5' (1.524 m)   Body mass index is 27.15 kg/m. Physical Exam Vitals reviewed.  Constitutional:      General: She is not in acute distress. HENT:     Head: Normocephalic.     Left Ear: There is no impacted cerumen.     Nose: Nose normal.     Mouth/Throat:     Mouth: Mucous membranes are moist.  Eyes:     General:        Right eye: No discharge.        Left eye: No discharge.  Neck:     Vascular: No carotid bruit.  Cardiovascular:     Rate and Rhythm: Normal rate and regular rhythm.     Pulses: Normal pulses.     Heart sounds: Normal heart sounds. No murmur heard. Pulmonary:     Effort: Pulmonary effort is normal. No respiratory distress.     Breath sounds: Normal breath sounds.  Abdominal:     General: Bowel sounds are normal. There is no distension.     Palpations: Abdomen is soft.     Tenderness: There is no abdominal tenderness.  Musculoskeletal:     Cervical back: Normal range of motion.     Right lower leg: No edema.     Left lower leg: No edema.  Lymphadenopathy:     Cervical: No cervical adenopathy.  Skin:    General: Skin is warm and dry.     Capillary Refill: Capillary refill  takes less than 2 seconds.  Neurological:     General: No focal deficit present.  Mental Status: She is alert. Mental status is at baseline.     Cranial Nerves: Cranial nerve deficit present.     Motor: Weakness present.     Comments: walker  Psychiatric:        Mood and Affect: Mood normal.        Behavior: Behavior normal.        Cognition and Memory: Memory is impaired.     Comments: Aphasia, follows commands    Labs reviewed: Recent Labs    02/17/20 0000 04/13/20 0000 07/28/20 0000  NA 138 141 141  K 4.1 4.7 4.3  CL 102 102 105  CO2 30* 27* 30*  BUN 12 11 13   CREATININE 0.6 0.7 0.7  CALCIUM 8.4* 9.1 8.7   Recent Labs    02/17/20 0000 04/13/20 0000 07/28/20 0000  AST 13 15 13   ALT 12 12 12   ALKPHOS 63 68 52  ALBUMIN 3.2* 4.0 3.6   Recent Labs    02/17/20 0000 04/13/20 0000 07/28/20 0000  WBC 12.9 9.7 6.6  NEUTROABS 8,811.00 6,431.00 3,109.00  HGB 10.3* 12.2 11.2*  HCT 31* 37 34*  PLT 320 333 251   Lab Results  Component Value Date   TSH 2.73 07/28/2020   Lab Results  Component Value Date   HGBA1C 5.5 07/28/2020   Lab Results  Component Value Date   CHOL 275 (H) 04/06/2018   HDL 52 04/06/2018   LDLCALC 190 (H) 04/06/2018   TRIG 161 (H) 04/06/2018   CHOLHDL 5.3 (H) 04/06/2018    Significant Diagnostic Results in last 30 days:  No results found.  Assessment/Plan 1. Late onset Alzheimer's dementia without behavioral disturbance ([place]) - no recent behavioral outbursts - very pleasant, follows commands - aphasia - ambulates with walker - cont skilled nursing care  2. Esophageal dysphagia - aspiration pneumonia 08/29/2020 - no recent aspirations - some choking episodes - cont supervised meals   3. Gastroesophageal reflux disease, unspecified whether esophagitis present - stable with Prilosec 20 mg daily  4. Depression, recurrent ([facility]) - cont Lexapro 10 mg daily  5. Age-related osteoporosis without current pathological fracture -  DEXA 2018, osteoporosis - cont calcium/vitamin D supplement - cont falls safety precautions    Family/ staff Communication: plan discussed with patient and nurse  Labs/tests ordered:  none

## 2020-11-03 ENCOUNTER — Non-Acute Institutional Stay (SKILLED_NURSING_FACILITY): Payer: Medicare PPO | Admitting: Orthopedic Surgery

## 2020-11-03 ENCOUNTER — Encounter: Payer: Self-pay | Admitting: Orthopedic Surgery

## 2020-11-03 DIAGNOSIS — F0280 Dementia in other diseases classified elsewhere without behavioral disturbance: Secondary | ICD-10-CM

## 2020-11-03 DIAGNOSIS — R1319 Other dysphagia: Secondary | ICD-10-CM | POA: Diagnosis not present

## 2020-11-03 DIAGNOSIS — J309 Allergic rhinitis, unspecified: Secondary | ICD-10-CM

## 2020-11-03 DIAGNOSIS — G301 Alzheimer's disease with late onset: Secondary | ICD-10-CM | POA: Diagnosis not present

## 2020-11-03 DIAGNOSIS — M810 Age-related osteoporosis without current pathological fracture: Secondary | ICD-10-CM

## 2020-11-03 DIAGNOSIS — F339 Major depressive disorder, recurrent, unspecified: Secondary | ICD-10-CM | POA: Diagnosis not present

## 2020-11-03 DIAGNOSIS — K219 Gastro-esophageal reflux disease without esophagitis: Secondary | ICD-10-CM

## 2020-11-03 NOTE — Progress Notes (Signed)
Location:  [place] Room Number: 3 Place of Service:  SNF [phone]) Provider: Windell Moulding, AGNP-C Grace Dad, MD  Patient Care Team: Grace Dad, MD as PCP - General (Internal Medicine) Lindwood Coke, MD as Consulting Physician (Dermatology) Christophe Louis, MD as Consulting Physician (Obstetrics and Gynecology) Latanya Maudlin, MD as Consulting Physician (Orthopedic Surgery) Garlan Fair, MD as Consulting Physician (Gastroenterology) Barnett Abu., MD as Consulting Physician (Cardiology) Neldon Mc Donnamarie Poag, MD as Consulting Physician (Allergy and Immunology) Rexene Alberts, MD as Consulting Physician (Cardiothoracic Surgery) Melida Quitter, MD as Consulting Physician (Otolaryngology) Ngetich, Nelda Bucks, NP as Nurse Practitioner (Family Medicine)  Extended Emergency Contact Information Primary Emergency Contact: Kathie Rhodes of [place] Phone: [phone] Mobile Phone: [phone] Relation: Legal Guardian Secondary Emergency Contact: Leana Gamer States of [place] Phone: [phone] Relation: Uncle  Code Status:  DNR Goals of care: Advanced Directive information Advanced Directives 10/13/2020  Does Patient Have a Medical Advance Directive? Yes  Type of Paramedic of [place];Out of facility DNR (pink MOST or yellow form)  Does patient want to make changes to medical advance directive? No - Patient declined  Copy of [place] in Chart? Yes - validated most recent copy scanned in chart (See row information)  Pre-existing out of facility DNR order (yellow form or pink MOST form) Yellow form placed in chart (order not valid for inpatient use);Pink MOST form placed in chart (order not valid for inpatient use)     Chief Complaint  Patient presents with   Medical Management of Chronic Issues    Routine follow up visit.   Health Maintenance    Eye exam,zoster vaccine,  urine microalbumin, Flu vaccine    HPI:  Pt is a 87 y.o. female seen today for medical management of chronic diseases.    She currently resides on the skilled nursing unit at [facility]. Past medical history includes: hypertension, dysphagia, GERD, Alzheimer's disease, osteoporosis, thrombocytosis, unsteady gait and frequent falls.  Alzheimers- no recent behavioral outbursts, follows commands, aphasia, alert to self and place today, continues to have poor safety awareness, does not remember to use walker, Namenda daily, Exelon patch.  Dysphagia- no recent aspirations, remains on pureed diet with nectar thick liquids, Reglan daily.  GERD- hgb 11.2 07/27/2020, prilosec 20 mg daily Depression- remains on Lexapro daily Osteoporosis- DEXA 2018, remains on calcium/vit d supplement daily  PRN nebs and oxygen discontinued per HPOA due to non use in > 60 days.   No recent falls or injuries. Often seen walking halls or at doorway of her room.   Recent blood pressures:  08/02- 125/76  07/26- 130/78  07/19- 153/81  Recent weights:   08/02- 145.3 lbs  07/01- 139 lbs  06/01- 140.4 lbs  05/03- 143.4 lbs  Nurse does not report any other concerns, vitals stable.        Past Medical History:  Diagnosis Date   Abnormal CT scan, chest September 2011   Scar tissue   Allergic rhinitis    Alzheimer's dementia ([place])    Asthma    Diabetes mellitus    borderline and under control-diet ,exercise   Esophageal stricture    Family history of colon cancer    Mother   GERD (gastroesophageal reflux disease)    Hiatal hernia    HLD (hyperlipidemia)    Hypertension    Osteopenia    Pneumonia 12-15 yrs.ago   yrs. ago  TIA (transient ischemic attack)    Vitamin D deficiency    Past Surgical History:  Procedure Laterality Date   ABDOMINAL HYSTERECTOMY  1980    BALLOON DILATION N/A 09/27/2018   Procedure: BALLOON DILATION;  Surgeon: Jackquline Denmark, MD;  [facility];  Service:  Endoscopy;  Laterality: N/A;   BIOPSY  09/27/2018   Procedure: BIOPSY;  Surgeon: Jackquline Denmark, MD;  [facility];  Service: Endoscopy;;   CATARACT EXTRACTION Left 2014   ESOPHAGOGASTRODUODENOSCOPY (EGD) WITH PROPOFOL N/A 09/27/2018   Procedure: ESOPHAGOGASTRODUODENOSCOPY (EGD) WITH PROPOFOL;  Surgeon: Jackquline Denmark, MD;  [facility];  Service: Endoscopy;  Laterality: N/A;   SHOULDER OPEN ROTATOR CUFF REPAIR  05/22/2011   Procedure: ROTATOR CUFF REPAIR SHOULDER OPEN;  Surgeon: Tobi Bastos, MD;  [facility];  Service: Orthopedics;  Laterality: Left;   THORACOTOMY / DECORTICATION PARIETAL PLEURA Right 204   empyema   TONSILLECTOMY      Allergies  Allergen Reactions   Aricept [Donepezil Hcl] Diarrhea   Crestor [Rosuvastatin Calcium]     Myalgia   Lipitor [Atorvastatin Calcium]    Lovastatin     Myalgia   Micardis [Telmisartan]     Fatigue   Sertraline Diarrhea   Welchol [Colesevelam Hcl]     Constipation   Zetia [Ezetimibe]     Myalgia   Zocor [Simvastatin]     Myalgia    Outpatient Encounter Medications as of 11/03/2020  Medication Sig   acetaminophen (TYLENOL) 325 MG tablet Take 325 mg by mouth every 6 (six) hours as needed for headache. Take Two Tablets ( 650 ) as needed.   aspirin EC 81 MG tablet Take 81 mg by mouth daily.   Calcium 600-200 MG-UNIT tablet Take 1 tablet by mouth daily. In the morning 7-11 am   cetirizine (ZYRTEC) 10 MG tablet Take 10 mg by mouth daily.   CHLORHEXIDINE GLUCONATE MT Use as directed 1 Dose in the mouth or throat daily. 1 tsp of solution to each teeth and gums with a toothbrush. Spit excess and do not rinse.   escitalopram (LEXAPRO) 10 MG tablet Take 1 tablet (10 mg total) by mouth daily.   ipratropium-albuterol (DUONEB) 0.5-2.5 (3) MG/3ML SOLN Take 3 mLs by nebulization every 6 (six) hours as needed.   memantine (NAMENDA XR) 28 MG CP24 24 hr capsule TAKE ONE CAPSULE ONCE A DAY TO PRESERVE MEMORY.   metoCLOPramide  (REGLAN) 5 MG tablet Take 5 mg by mouth in the morning and at bedtime.   Multiple Vitamins-Minerals (PRESERVISION AREDS 2) CHEW Chew 1 tablet by mouth in the morning and at bedtime.   NON FORMULARY Med Pass 2.0 liquid; 2.0; amt: 180ML.; oral Special Instructions: Med Pass 2.0 180ML QD @ 2 PM. Doc. % consumed. Once a day   NON FORMULARY Med Pass 2.0 liquid; 2.0; amt: 180ML.; oral Special Instructions: Med Pass 2.0 180ML QD @ 2 PM. Doc. % consumed. Three times a day   omeprazole (PRILOSEC) 20 MG capsule Take 20 mg by mouth daily. Do not crush; Open capsule and sprinkle on applesauce   rivastigmine (EXELON) 9.5 mg/24hr Apply fresh patch daily and remove old patch to help preserve memory   Sodium Fluoride (PREVIDENT 5000 BOOSTER PLUS) 1.1 % PSTE Place 1 application onto teeth every evening.   zinc oxide 20 % ointment Apply 1 application topically as needed for irritation. Apply to buttocks/peri topical as needed   No facility-administered encounter medications on file as of 11/03/2020.    Review  of Systems  Unable to perform ROS: Dementia   Immunization History  Administered Date(s) Administered   Influenza, High Dose Seasonal PF 01/14/2019, 01/04/2020   Influenza,inj,Quad PF,6+ Mos 12/29/2017   Influenza-Unspecified 12/01/2014, 01/11/2016, 01/04/2020   Moderna SARS-COV2 Booster Vaccination 09/06/2020   Moderna Sars-Covid-2 Vaccination 04/05/2019, 05/03/2019, 02/14/2020   Pneumococcal Conjugate-13 11/22/2013   Pneumococcal Polysaccharide-23 07/01/2006   Tdap 12/17/2011   Zoster, Live 12/25/2005   Pertinent  Health Maintenance Due  Topic Date Due   OPHTHALMOLOGY EXAM  Never done   URINE MICROALBUMIN  02/11/2017   INFLUENZA VACCINE  10/30/2020   HEMOGLOBIN A1C  01/27/2021   FOOT EXAM  08/15/2021   DEXA SCAN  Completed   PNA vac Low Risk Adult  Completed   Fall Risk  09/29/2020 06/17/2018 04/22/2018 01/22/2017 07/23/2016  Falls in the past year? 1 - 0 No No  Number falls in past yr: 1 -  0 - -  Injury with Fall? 0 - 0 - -  Risk for fall due to : History of fall(s);Impaired balance/gait;Impaired mobility;Impaired vision;Mental status change Impaired vision - - -  Follow up Falls evaluation completed;Education provided;Falls prevention discussed - - - -   Functional Status Survey:    Vitals:   11/03/20 1333  BP: 125/76  Pulse: 62  Resp: 16  Temp: 97.6 F (36.4 C)  SpO2: 94%  Weight: 145 lb 4.8 oz (65.9 kg)  Height: 5' (1.524 m)   Body mass index is 28.38 kg/m. Physical Exam Vitals reviewed.  Constitutional:      General: She is not in acute distress. HENT:     Head: Normocephalic.     Right Ear: There is no impacted cerumen.     Left Ear: There is no impacted cerumen.     Nose: Nose normal.     Mouth/Throat:     Mouth: Mucous membranes are moist.  Eyes:     General:        Right eye: No discharge.        Left eye: No discharge.  Neck:     Vascular: No carotid bruit.  Cardiovascular:     Rate and Rhythm: Normal rate and regular rhythm.     Pulses: Normal pulses.     Heart sounds: Normal heart sounds. No murmur heard. Pulmonary:     Effort: Pulmonary effort is normal. No respiratory distress.     Breath sounds: Normal breath sounds. No wheezing.  Abdominal:     General: Bowel sounds are normal. There is no distension.     Palpations: Abdomen is soft.     Tenderness: There is no abdominal tenderness.  Musculoskeletal:     Cervical back: Normal range of motion.     Right lower leg: No edema.     Left lower leg: No edema.  Lymphadenopathy:     Cervical: No cervical adenopathy.  Skin:    General: Skin is warm and dry.     Capillary Refill: Capillary refill takes less than 2 seconds.  Neurological:     General: No focal deficit present.     Mental Status: She is alert. Mental status is at baseline.     Motor: Weakness present.     Gait: Gait abnormal.     Comments: walker  Psychiatric:        Attention and Perception: Attention normal.         Mood and Affect: Mood normal.        Cognition and Memory: Memory is  impaired.     Comments: aphasia    Labs reviewed: Recent Labs    02/17/20 0000 04/13/20 0000 07/28/20 0000  NA 138 141 141  K 4.1 4.7 4.3  CL 102 102 105  CO2 30* 27* 30*  BUN '12 11 13  '$ CREATININE 0.6 0.7 0.7  CALCIUM 8.4* 9.1 8.7   Recent Labs    02/17/20 0000 04/13/20 0000 07/28/20 0000  AST '13 15 13  '$ ALT '12 12 12  '$ ALKPHOS 63 68 52  ALBUMIN 3.2* 4.0 3.6   Recent Labs    02/17/20 0000 04/13/20 0000 07/28/20 0000  WBC 12.9 9.7 6.6  NEUTROABS 8,811.00 6,431.00 3,109.00  HGB 10.3* 12.2 11.2*  HCT 31* 37 34*  PLT 320 333 251   Lab Results  Component Value Date   TSH 2.73 07/28/2020   Lab Results  Component Value Date   HGBA1C 5.5 07/28/2020   Lab Results  Component Value Date   CHOL 275 (H) 04/06/2018   HDL 52 04/06/2018   LDLCALC 190 (H) 04/06/2018   TRIG 161 (H) 04/06/2018   CHOLHDL 5.3 (H) 04/06/2018    Significant Diagnostic Results in last 30 days:  No results found.  Assessment/Plan: 1. Late onset Alzheimer's dementia without behavioral disturbance ([place]) - no recent behavioral outbursts - in late stages of disease -still ambulating with walker, poor safety awareness - able to feed self with monitoring - cont skilled nursing care -cont Exelon patch and daily namenda  2. Esophageal dysphagia - no recent aspirations - cont monitored feedings - cont pureed diet with nectar thick fluids - cont Reglan   3. Gastroesophageal reflux disease, unspecified whether esophagitis present - stable with Prilosec 20 mg daily  4. Depression, recurrent ([place]) - stable with lexapro daily   5. Age-related osteoporosis without current pathological fracture - DEXA 2018 - remains high fall risk - cont falls safety precautions - cont calcium/vit d supplement  6. Allergic rhinitis, unspecified seasonality, unspecified trigger - stable with daily zyrtec    Family/ staff Communication:  plan discussed with nurse  Labs/tests ordered:  none

## 2020-11-27 ENCOUNTER — Encounter: Payer: Self-pay | Admitting: Orthopedic Surgery

## 2020-11-27 ENCOUNTER — Inpatient Hospital Stay (HOSPITAL_COMMUNITY)
Admission: EM | Admit: 2020-11-27 | Discharge: 2020-12-02 | DRG: 521 | Disposition: A | Discharge status: Skilled Nursing Facility | Payer: Medicare PPO | Source: Skilled Nursing Facility | Attending: Internal Medicine | Admitting: Internal Medicine

## 2020-11-27 ENCOUNTER — Other Ambulatory Visit: Payer: Self-pay

## 2020-11-27 ENCOUNTER — Encounter (HOSPITAL_COMMUNITY): Payer: Self-pay | Admitting: Emergency Medicine

## 2020-11-27 ENCOUNTER — Emergency Department (HOSPITAL_COMMUNITY): Payer: Medicare PPO

## 2020-11-27 ENCOUNTER — Non-Acute Institutional Stay (SKILLED_NURSING_FACILITY): Payer: Medicare PPO | Admitting: Orthopedic Surgery

## 2020-11-27 DIAGNOSIS — R2689 Other abnormalities of gait and mobility: Secondary | ICD-10-CM | POA: Diagnosis not present

## 2020-11-27 DIAGNOSIS — S51011A Laceration without foreign body of right elbow, initial encounter: Secondary | ICD-10-CM | POA: Diagnosis not present

## 2020-11-27 DIAGNOSIS — G301 Alzheimer's disease with late onset: Secondary | ICD-10-CM

## 2020-11-27 DIAGNOSIS — Z8673 Personal history of transient ischemic attack (TIA), and cerebral infarction without residual deficits: Secondary | ICD-10-CM

## 2020-11-27 DIAGNOSIS — J9811 Atelectasis: Secondary | ICD-10-CM | POA: Diagnosis present

## 2020-11-27 DIAGNOSIS — Z20822 Contact with and (suspected) exposure to covid-19: Secondary | ICD-10-CM | POA: Diagnosis present

## 2020-11-27 DIAGNOSIS — F0280 Dementia in other diseases classified elsewhere without behavioral disturbance: Secondary | ICD-10-CM | POA: Diagnosis present

## 2020-11-27 DIAGNOSIS — Z96641 Presence of right artificial hip joint: Secondary | ICD-10-CM

## 2020-11-27 DIAGNOSIS — S72001A Fracture of unspecified part of neck of right femur, initial encounter for closed fracture: Secondary | ICD-10-CM

## 2020-11-27 DIAGNOSIS — E119 Type 2 diabetes mellitus without complications: Secondary | ICD-10-CM | POA: Diagnosis present

## 2020-11-27 DIAGNOSIS — S72011A Unspecified intracapsular fracture of right femur, initial encounter for closed fracture: Principal | ICD-10-CM | POA: Diagnosis present

## 2020-11-27 DIAGNOSIS — G309 Alzheimer's disease, unspecified: Secondary | ICD-10-CM | POA: Diagnosis present

## 2020-11-27 DIAGNOSIS — R0902 Hypoxemia: Secondary | ICD-10-CM

## 2020-11-27 DIAGNOSIS — I10 Essential (primary) hypertension: Secondary | ICD-10-CM | POA: Diagnosis present

## 2020-11-27 DIAGNOSIS — D62 Acute posthemorrhagic anemia: Secondary | ICD-10-CM | POA: Diagnosis present

## 2020-11-27 DIAGNOSIS — M25551 Pain in right hip: Secondary | ICD-10-CM

## 2020-11-27 DIAGNOSIS — Z7982 Long term (current) use of aspirin: Secondary | ICD-10-CM

## 2020-11-27 DIAGNOSIS — J45909 Unspecified asthma, uncomplicated: Secondary | ICD-10-CM | POA: Diagnosis present

## 2020-11-27 DIAGNOSIS — Z66 Do not resuscitate: Secondary | ICD-10-CM | POA: Diagnosis present

## 2020-11-27 DIAGNOSIS — J9601 Acute respiratory failure with hypoxia: Secondary | ICD-10-CM | POA: Diagnosis present

## 2020-11-27 DIAGNOSIS — Z79899 Other long term (current) drug therapy: Secondary | ICD-10-CM

## 2020-11-27 DIAGNOSIS — W19XXXA Unspecified fall, initial encounter: Secondary | ICD-10-CM | POA: Diagnosis present

## 2020-11-27 DIAGNOSIS — M25521 Pain in right elbow: Secondary | ICD-10-CM

## 2020-11-27 DIAGNOSIS — D72829 Elevated white blood cell count, unspecified: Secondary | ICD-10-CM | POA: Diagnosis present

## 2020-11-27 DIAGNOSIS — D649 Anemia, unspecified: Secondary | ICD-10-CM | POA: Diagnosis present

## 2020-11-27 DIAGNOSIS — R5381 Other malaise: Secondary | ICD-10-CM | POA: Diagnosis present

## 2020-11-27 DIAGNOSIS — K219 Gastro-esophageal reflux disease without esophagitis: Secondary | ICD-10-CM | POA: Diagnosis present

## 2020-11-27 DIAGNOSIS — Z87891 Personal history of nicotine dependence: Secondary | ICD-10-CM

## 2020-11-27 DIAGNOSIS — E785 Hyperlipidemia, unspecified: Secondary | ICD-10-CM | POA: Diagnosis present

## 2020-11-27 DIAGNOSIS — Z888 Allergy status to other drugs, medicaments and biological substances status: Secondary | ICD-10-CM

## 2020-11-27 DIAGNOSIS — Y92129 Unspecified place in nursing home as the place of occurrence of the external cause: Secondary | ICD-10-CM

## 2020-11-27 LAB — CBC WITH DIFFERENTIAL/PLATELET
Abs Immature Granulocytes: 0.14 10*3/uL — ABNORMAL HIGH (ref 0.00–0.07)
Basophils Absolute: 0.1 10*3/uL (ref 0.0–0.1)
Basophils Relative: 0 %
Eosinophils Absolute: 0.4 10*3/uL (ref 0.0–0.5)
Eosinophils Relative: 3 %
HCT: 36.1 % (ref 36.0–46.0)
Hemoglobin: 11.8 g/dL — ABNORMAL LOW (ref 12.0–15.0)
Immature Granulocytes: 1 %
Lymphocytes Relative: 12 %
Lymphs Abs: 1.9 10*3/uL (ref 0.7–4.0)
MCH: 30.3 pg (ref 26.0–34.0)
MCHC: 32.7 g/dL (ref 30.0–36.0)
MCV: 92.6 fL (ref 80.0–100.0)
Monocytes Absolute: 1.4 10*3/uL — ABNORMAL HIGH (ref 0.1–1.0)
Monocytes Relative: 9 %
Neutro Abs: 11.1 10*3/uL — ABNORMAL HIGH (ref 1.7–7.7)
Neutrophils Relative %: 75 %
Platelets: 223 10*3/uL (ref 150–400)
RBC: 3.9 MIL/uL (ref 3.87–5.11)
RDW: 16.4 % — ABNORMAL HIGH (ref 11.5–15.5)
WBC: 15 10*3/uL — ABNORMAL HIGH (ref 4.0–10.5)
nRBC: 0 % (ref 0.0–0.2)

## 2020-11-27 MED ORDER — ACETAMINOPHEN 500 MG PO TABS: 1000.0000 mg | ORAL_TABLET | Freq: Every day | ORAL | 0 refills | Status: DC | PRN

## 2020-11-27 MED ORDER — ACETAMINOPHEN 500 MG PO TABS: 1000.0000 mg | ORAL_TABLET | Freq: Two times a day (BID) | ORAL | 0 refills | Status: DC

## 2020-11-27 NOTE — ED Notes (Signed)
x1 Blue, x2 Gld, x1 Dark Grn sent to lab as saves, along with ordered tubes.

## 2020-11-27 NOTE — ED Triage Notes (Signed)
Patient presents from [facility] post fall yesterday. She was diagnosed with right  femoral neck displacement. EMS noted shortening of the right leg with external rotation, with bilateral pedal pulses. They also note the patient is hot to the touch. An abrasion can be found on the right elbow.  HX: Alzheimer's dementia  EMS vitals: 142/72 BP 73 HR 93% SPO2 on room air 172 CBG

## 2020-11-27 NOTE — Progress Notes (Signed)
Location:   [place] Room Number: [place] of Service:  SNF [phone]) Provider:  Windell Moulding, NP  [name], MD  Patient Care Team: [name], MD as PCP - General (Internal Medicine) Lindwood Coke, MD as Consulting Physician (Dermatology) Christophe Louis, MD as Consulting Physician (Obstetrics and Gynecology) Latanya Maudlin, MD as Consulting Physician (Orthopedic Surgery) Garlan Fair, MD as Consulting Physician (Gastroenterology) Barnett Abu., MD as Consulting Physician (Cardiology) Neldon Mc Donnamarie Poag, MD as Consulting Physician (Allergy and Immunology) Rexene Alberts, MD (Inactive) as Consulting Physician (Cardiothoracic Surgery) Melida Quitter, MD as Consulting Physician (Otolaryngology) Ngetich, Nelda Bucks, NP as Nurse Practitioner (Family Medicine)  Extended Emergency Contact Information Primary Emergency Contact: Kathie Rhodes of [place] Phone: [phone] Mobile Phone: [phone] Relation: Legal Guardian Secondary Emergency Contact: Leana Gamer [place] of [place] Phone: [phone] Relation: Uncle  Code Status:  DNR Goals of care: Advanced Directive information Advanced Directives 11/27/2020  Does Patient Have a Medical Advance Directive? Yes  Type of Paramedic of [place];Out of facility DNR (pink MOST or yellow form)  Does patient want to make changes to medical advance directive? No - Patient declined  Copy of [place] in Chart? Yes - validated most recent copy scanned in chart (See row information)  Pre-existing out of facility DNR order (yellow form or pink MOST form) Yellow form placed in chart (order not valid for inpatient use);Pink MOST form placed in chart (order not valid for inpatient use)     Chief Complaint  Patient presents with   Acute Visit    Patient presents after a fall with right hip pain.     HPI:  Pt is a 87 y.o. female  seen today for an acute visit for right hip pain.   She currently resides on the skilled nursing unit at [facility]. Past medical history includes: hypertension, dysphagia, GERD, Alzheimer's disease, osteoporosis, thrombocytosis, unsteady gait and frequent falls.  Nursing reports fall 11/26/2020. She was sitting at the nurses station with staff when all of a sudden she stood and fell on her right side. No injury to her head. She developed a skin tear to right elbow. After fall she was able to state her name and follow commands, vitals stable. She was initially able to wear weight on her right leg, but today reports pain to right hip and elbow. She has poor safety awareness due to Alzheimer's, she has also had a shuffling gait for awhile. Staff denies increased confusion or behavioral outbursts.   On call NP notified, orders for right hip xray obtained. X-ray has not been completed at this time.   Nurse does not report any other concerns, vitals stable.     Past Medical History:  Diagnosis Date   Abnormal CT scan, chest September 2011   Scar tissue   Allergic rhinitis    Alzheimer's dementia ([place])    Asthma    Diabetes mellitus    borderline and under control-diet ,exercise   Esophageal stricture    Family history of colon cancer    Mother   GERD (gastroesophageal reflux disease)    Hiatal hernia    HLD (hyperlipidemia)    Hypertension    Osteopenia    Pneumonia 12-15 yrs.ago   yrs. ago   TIA (transient ischemic attack)    Vitamin D deficiency    Past Surgical History:  Procedure Laterality Date   ABDOMINAL HYSTERECTOMY  1980  BALLOON DILATION N/A 09/27/2018   Procedure: BALLOON DILATION;  Surgeon: Jackquline Denmark, MD;  [facility];  Service: Endoscopy;  Laterality: N/A;   BIOPSY  09/27/2018   Procedure: BIOPSY;  Surgeon: Jackquline Denmark, MD;  [facility];  Service: Endoscopy;;   CATARACT EXTRACTION Left 2014   ESOPHAGOGASTRODUODENOSCOPY (EGD) WITH  PROPOFOL N/A 09/27/2018   Procedure: ESOPHAGOGASTRODUODENOSCOPY (EGD) WITH PROPOFOL;  Surgeon: Jackquline Denmark, MD;  [facility];  Service: Endoscopy;  Laterality: N/A;   SHOULDER OPEN ROTATOR CUFF REPAIR  05/22/2011   Procedure: ROTATOR CUFF REPAIR SHOULDER OPEN;  Surgeon: Tobi Bastos, MD;  [facility];  Service: Orthopedics;  Laterality: Left;   THORACOTOMY / DECORTICATION PARIETAL PLEURA Right 204   empyema   TONSILLECTOMY      Allergies  Allergen Reactions   Aricept [Donepezil Hcl] Diarrhea   Crestor [Rosuvastatin Calcium]     Myalgia   Lipitor [Atorvastatin Calcium]    Lovastatin     Myalgia   Micardis [Telmisartan]     Fatigue   Sertraline Diarrhea   Welchol [Colesevelam Hcl]     Constipation   Zetia [Ezetimibe]     Myalgia   Zocor [Simvastatin]     Myalgia    Allergies as of 11/27/2020       Reactions   Aricept [donepezil Hcl] Diarrhea   Crestor [rosuvastatin Calcium]    Myalgia   Lipitor [atorvastatin Calcium]    Lovastatin    Myalgia   Micardis [telmisartan]    Fatigue   Sertraline Diarrhea   Welchol [colesevelam Hcl]    Constipation   Zetia [ezetimibe]    Myalgia   Zocor [simvastatin]    Myalgia        Medication List        Accurate as of November 27, 2020 10:08 AM. If you have any questions, ask your nurse or doctor.          acetaminophen 325 MG tablet Commonly known as: TYLENOL Take 325 mg by mouth every 6 (six) hours as needed for headache. Take Two Tablets ( 650 ) as needed.   aspirin EC 81 MG tablet Take 81 mg by mouth daily.   Calcium 600-200 MG-UNIT tablet Take 1 tablet by mouth daily. In the morning 7-11 am   cetirizine 10 MG tablet Commonly known as: ZYRTEC Take 10 mg by mouth daily.   CHLORHEXIDINE GLUCONATE MT Use as directed 1 Dose in the mouth or throat daily. 1 tsp of solution to each teeth and gums with a toothbrush. Spit excess and do not rinse.   escitalopram 10 MG tablet Commonly known as:  LEXAPRO Take 1 tablet (10 mg total) by mouth daily.   memantine 28 MG Cp24 24 hr capsule Commonly known as: NAMENDA XR TAKE ONE CAPSULE ONCE A DAY TO PRESERVE MEMORY.   metoCLOPramide 5 MG tablet Commonly known as: REGLAN Take 5 mg by mouth in the morning and at bedtime.   NON FORMULARY Med Pass 2.0 liquid; 2.0; amt: 180ML.; oral Special Instructions: Med Pass 2.0 180ML QD @ 2 PM. Doc. % consumed. Once a day   NON FORMULARY Med Pass 2.0 liquid; 2.0; amt: 180ML.; oral Special Instructions: Med Pass 2.0 180ML QD @ 2 PM. Doc. % consumed. Three times a day   omeprazole 20 MG capsule Commonly known as: PRILOSEC Take 20 mg by mouth daily. Do not crush; Open capsule and sprinkle on applesauce   PreserVision AREDS 2 Chew Chew 1 tablet by mouth in the  morning and at bedtime.   PreviDent 5000 Booster Plus 1.1 % Pste Generic drug: Sodium Fluoride Place 1 application onto teeth every evening.   rivastigmine 9.5 mg/24hr Commonly known as: EXELON Apply fresh patch daily and remove old patch to help preserve memory   zinc oxide 20 % ointment Apply 1 application topically as needed for irritation. Apply to buttocks/peri topical as needed        Review of Systems  Unable to perform ROS: Dementia   Immunization History  Administered Date(s) Administered   Influenza, High Dose Seasonal PF 01/14/2019, 01/04/2020   Influenza,inj,Quad PF,6+ Mos 12/29/2017   Influenza-Unspecified 12/01/2014, 01/11/2016, 01/04/2020   Moderna SARS-COV2 Booster Vaccination 09/06/2020   Moderna Sars-Covid-2 Vaccination 04/05/2019, 05/03/2019, 02/14/2020   Pneumococcal Conjugate-13 11/22/2013   Pneumococcal Polysaccharide-23 07/01/2006   Tdap 12/17/2011   Zoster, Live 12/25/2005   Pertinent  Health Maintenance Due  Topic Date Due   OPHTHALMOLOGY EXAM  Never done   URINE MICROALBUMIN  02/11/2017   INFLUENZA VACCINE  10/30/2020   HEMOGLOBIN A1C  01/27/2021   FOOT EXAM  08/15/2021   DEXA SCAN   Completed   PNA vac Low Risk Adult  Completed   Fall Risk  09/29/2020 06/17/2018 04/22/2018 01/22/2017 07/23/2016  Falls in the past year? 1 - 0 No No  Number falls in past yr: 1 - 0 - -  Injury with Fall? 0 - 0 - -  Risk for fall due to : History of fall(s);Impaired balance/gait;Impaired mobility;Impaired vision;Mental status change Impaired vision - - -  Follow up Falls evaluation completed;Education provided;Falls prevention discussed - - - -   Functional Status Survey:    Vitals:   11/27/20 1001  BP: 133/75  Pulse: 63  Resp: 16  Temp: 97.8 F (36.6 C)  SpO2: 96%  Weight: 145 lb 4.8 oz (65.9 kg)  Height: 5' (1.524 m)   Body mass index is 28.38 kg/m. Physical Exam Vitals reviewed.  Constitutional:      General: She is not in acute distress. HENT:     Head: Normocephalic.  Cardiovascular:     Rate and Rhythm: Normal rate and regular rhythm.     Pulses: Normal pulses.     Heart sounds: Normal heart sounds. No murmur heard. Pulmonary:     Effort: Pulmonary effort is normal. No respiratory distress.     Breath sounds: Normal breath sounds. No wheezing.  Abdominal:     General: Bowel sounds are normal. There is no distension.     Palpations: Abdomen is soft.     Tenderness: There is no abdominal tenderness.  Musculoskeletal:     Right elbow: Decreased range of motion. Tenderness present in lateral epicondyle.     Right hip: Tenderness present. No deformity or crepitus. Decreased range of motion. Decreased strength.     Right lower leg: No edema.     Left lower leg: No edema.     Comments: Right hip pain increased with adduction/abduction, extension/flexion. Leg lengths even.   Skin:    General: Skin is warm and dry.     Capillary Refill: Capillary refill takes less than 2 seconds.     Findings: Lesion present.     Comments: Nickel sized skin tear to right elbow, CDI, covered with non-adherent dressing. Surrounding intact with no bruising.   Neurological:     General: No  focal deficit present.     Mental Status: She is alert. Mental status is at baseline.     Motor: Weakness present.  Gait: Gait abnormal.    Labs reviewed: Recent Labs    02/17/20 0000 04/13/20 0000 07/28/20 0000  NA 138 141 141  K 4.1 4.7 4.3  CL 102 102 105  CO2 30* 27* 30*  BUN '12 11 13  '$ CREATININE 0.6 0.7 0.7  CALCIUM 8.4* 9.1 8.7   Recent Labs    02/17/20 0000 04/13/20 0000 07/28/20 0000  AST '13 15 13  '$ ALT '12 12 12  '$ ALKPHOS 63 68 52  ALBUMIN 3.2* 4.0 3.6   Recent Labs    02/17/20 0000 04/13/20 0000 07/28/20 0000  WBC 12.9 9.7 6.6  NEUTROABS 8,811.00 6,431.00 3,109.00  HGB 10.3* 12.2 11.2*  HCT 31* 37 34*  PLT 320 333 251   Lab Results  Component Value Date   TSH 2.73 07/28/2020   Lab Results  Component Value Date   HGBA1C 5.5 07/28/2020   Lab Results  Component Value Date   CHOL 275 (H) 04/06/2018   HDL 52 04/06/2018   LDLCALC 190 (H) 04/06/2018   TRIG 161 (H) 04/06/2018   CHOLHDL 5.3 (H) 04/06/2018    Significant Diagnostic Results in last 30 days:  No results found.  Assessment/Plan 1. Right hip pain - pain with movement and palpation - she is ambulating less since incident - leg lengths even, no apparent bruising - xray right hip - d/c tylenol 650 mg po q6 hrs prn - start tylenol 1000 mg po bid - start tylenol 1000 mg po daily prn  2. Late onset Alzheimer's dementia without behavioral disturbance ([place]) - poor safety awareness  - cont skilled nursing care  3. Shuffling gait - see above - cont skilled nursing care  4. Skin tear of right elbow without complication, initial encounter - small nickel sized skin tear, CDI - cont non adherent dressing prn  5. Right elbow pain - limited movement during exam, pain noted with movement - xray right elbow   Family/ staff Communication: plan discussed with nurse  Labs/tests ordered:  xray right hip, elbow and shoulder

## 2020-11-27 NOTE — ED Provider Notes (Signed)
[facility] DEPT Provider Note   CSN: [id number] Arrival date & time: 11/27/20  2228     History Chief Complaint  Patient presents with   Fall   Leg Injury   Leg Pain    Grace Gilbert is a 87 y.o. female.  The history is provided by the EMS personnel and a relative. The history is limited by the condition of the patient.  Fall This is a new problem. The current episode started yesterday. The problem occurs constantly. The problem has not changed since onset.Pertinent negatives include no chest pain and no abdominal pain.  Leg Pain     Past Medical History:  Diagnosis Date   Abnormal CT scan, chest September 2011   Scar tissue   Allergic rhinitis    Alzheimer's dementia ([place])    Asthma    Diabetes mellitus    borderline and under control-diet ,exercise   Esophageal stricture    Family history of colon cancer    Mother   GERD (gastroesophageal reflux disease)    Hiatal hernia    HLD (hyperlipidemia)    Hypertension    Osteopenia    Pneumonia 12-15 yrs.ago   yrs. ago   TIA (transient ischemic attack)    Vitamin D deficiency     Patient Active Problem List   Diagnosis Date Noted   Closed displaced fracture of right femoral neck ([place]) 11/28/2020   Frequent falls 08/15/2020   Aspiration pneumonia ([place]) 02/07/2020   Generalized weakness 02/07/2020   Fall 12/31/2019   Choking episode 11/19/2019   Choking 08/27/2019   Weight gain 06/22/2019   GERD (gastroesophageal reflux disease) 05/25/2019   Unsteady gait 01/06/2019   Depression, recurrent ([place]) 01/06/2019   Dysphagia    Pleural effusion on left    FTT (failure to thrive) in adult    Vascular dementia without behavioral disturbance ([facility])    Normocytic anemia    Thrombocytosis    HCAP (healthcare-associated pneumonia) 09/17/2018   Allergic rhinitis 06/02/2017   History of TIA (transient ischemic attack) 03/05/2017   History of rotator cuff tear 03/05/2017   Age-related  osteoporosis without current pathological fracture 12/09/2016   Prediabetes 11/20/2016   Hypertension    Alzheimer's dementia ([place])    HLD (hyperlipidemia)    Esophageal stricture     Past Surgical History:  Procedure Laterality Date   ABDOMINAL HYSTERECTOMY  1980    BALLOON DILATION N/A 09/27/2018   Procedure: BALLOON DILATION;  Surgeon: Jackquline Denmark, MD;  [facility];  Service: Endoscopy;  Laterality: N/A;   BIOPSY  09/27/2018   Procedure: BIOPSY;  Surgeon: Jackquline Denmark, MD;  [facility];  Service: Endoscopy;;   CATARACT EXTRACTION Left 2014   ESOPHAGOGASTRODUODENOSCOPY (EGD) WITH PROPOFOL N/A 09/27/2018   Procedure: ESOPHAGOGASTRODUODENOSCOPY (EGD) WITH PROPOFOL;  Surgeon: Jackquline Denmark, MD;  [facility];  Service: Endoscopy;  Laterality: N/A;   SHOULDER OPEN ROTATOR CUFF REPAIR  05/22/2011   Procedure: ROTATOR CUFF REPAIR SHOULDER OPEN;  Surgeon: Tobi Bastos, MD;  [facility];  Service: Orthopedics;  Laterality: Left;   THORACOTOMY / DECORTICATION PARIETAL PLEURA Right 204   empyema   TONSILLECTOMY       OB History   No obstetric history on file.     History reviewed. No pertinent family history.  Social History   Tobacco Use   Smoking status: Former    Packs/day: 1.50    Years: 20.00    Pack years: 30.00    Types:  Cigarettes    Quit date: 05/15/1987    Years since quitting: 33.5   Smokeless tobacco: Never  Vaping Use   Vaping Use: Never used  Substance Use Topics   Alcohol use: No    Alcohol/week: 1.0 standard drink    Types: 1 Glasses of wine per week   Drug use: No    Home Medications Prior to Admission medications   Medication Sig Start Date End Date Taking? Authorizing Provider  acetaminophen (TYLENOL) 500 MG tablet Take 2 tablets (1,000 mg total) by mouth in the morning and at bedtime. 11/27/20   [place]argo, Amy E, NP  acetaminophen (TYLENOL) 500 MG tablet Take 2 tablets (1,000 mg total) by mouth daily as needed.  11/27/20   [name], NP  aspirin EC 81 MG tablet Take 81 mg by mouth daily.    [provider]  Calcium 600-200 MG-UNIT tablet Take 1 tablet by mouth daily. In the morning 7-11 am    [provider]  cetirizine (ZYRTEC) 10 MG tablet Take 10 mg by mouth daily. 10/19/18   [provider]  CHLORHEXIDINE GLUCONATE MT Use as directed 1 Dose in the mouth or throat daily. 1 tsp of solution to each teeth and gums with a toothbrush. Spit excess and do not rinse.    [provider]  escitalopram (LEXAPRO) 10 MG tablet Take 1 tablet (10 mg total) by mouth daily. 06/04/19   [name], MD  memantine (NAMENDA XR) 28 MG CP24 24 hr capsule TAKE ONE CAPSULE ONCE A DAY TO PRESERVE MEMORY. 02/03/18   Ngetich, Dinah C, NP  metoCLOPramide (REGLAN) 5 MG tablet Take 5 mg by mouth in the morning and at bedtime.    [provider]  Multiple Vitamins-Minerals (PRESERVISION AREDS 2) CHEW Chew 1 tablet by mouth in the morning and at bedtime.    [provider]  NON FORMULARY Med Pass 2.0 liquid; 2.0; amt: 180ML.; oral Special Instructions: Med Pass 2.0 180ML QD @ 2 PM. Doc. % consumed. Once a day    [provider]  NON FORMULARY Med Pass 2.0 liquid; 2.0; amt: 180ML.; oral Special Instructions: Med Pass 2.0 180ML QD @ 2 PM. Doc. % consumed. Three times a day    [provider]  omeprazole (PRILOSEC) 20 MG capsule Take 20 mg by mouth daily. Do not crush; Open capsule and sprinkle on applesauce 11/09/18   [provider]  rivastigmine (EXELON) 9.5 mg/24hr Apply fresh patch daily and remove old patch to help preserve memory 10/07/17   Blanchie Serve, MD  Sodium Fluoride (PREVIDENT 5000 BOOSTER PLUS) 1.1 % PSTE Place 1 application onto teeth every evening.    [provider]  zinc oxide 20 % ointment Apply 1 application topically as needed for irritation. Apply to buttocks/peri topical as needed    [provider]    Allergies     Aricept [donepezil hcl], Crestor [rosuvastatin calcium], Lipitor [atorvastatin calcium], Lovastatin, Micardis [telmisartan], Sertraline, Welchol [colesevelam hcl], Zetia [ezetimibe], and Zocor [simvastatin]  Review of Systems   Review of Systems  Unable to perform ROS: Dementia  Cardiovascular:  Negative for chest pain.  Gastrointestinal:  Negative for abdominal pain.   Physical Exam Updated Vital Signs BP (!) 144/71   Pulse 67   Temp 97.9 F (36.6 C) (Oral)   Resp 17   SpO2 98%   Physical Exam Vitals and nursing note reviewed.  Constitutional:      Appearance: She is well-developed.  HENT:  Head: Normocephalic and atraumatic.     Mouth/Throat:     Mouth: Mucous membranes are moist.     Pharynx: Oropharynx is clear.  Eyes:     Pupils: Pupils are equal, round, and reactive to light.  Cardiovascular:     Rate and Rhythm: Normal rate and regular rhythm.  Pulmonary:     Effort: Pulmonary effort is normal. No respiratory distress.     Breath sounds: No stridor.  Abdominal:     General: Abdomen is flat. There is no distension.  Musculoskeletal:        General: Tenderness (r hip is also shortened and rotated) present. Normal range of motion.     Cervical back: Normal range of motion.  Skin:    General: Skin is warm and dry.     Coloration: Skin is not jaundiced or pale.  Neurological:     General: No focal deficit present.     Mental Status: She is alert.    ED Results / Procedures / Treatments   Labs (all labs ordered are listed, but only abnormal results are displayed) Labs Reviewed  BASIC METABOLIC PANEL - Abnormal; Notable for the following components:      Result Value   Glucose, Bld 119 (*)    All other components within normal limits  CBC WITH DIFFERENTIAL/PLATELET - Abnormal; Notable for the following components:   WBC 15.0 (*)    Hemoglobin 11.8 (*)    RDW 16.4 (*)    Neutro Abs 11.1 (*)    Monocytes Absolute 1.4 (*)    Abs Immature Granulocytes  0.14 (*)    All other components within normal limits  SARS CORONAVIRUS 2 (TAT 6-24 HRS)  PROTIME-INR  TYPE AND SCREEN    EKG None  Radiology DG Chest 1 View  Result Date: 11/28/2020 CLINICAL DATA:  Status post fall. EXAM: CHEST  1 VIEW COMPARISON:  September 25, 2018 FINDINGS: Low lung volumes are seen with mild, diffuse, chronic appearing increased lung markings. Mild prominence of the pulmonary vasculature is noted. There is no evidence of an acute infiltrate, pleural effusion or pneumothorax. The heart size and mediastinal contours are within normal limits. There is moderate severity calcification of the aortic arch. Degenerative changes are seen throughout the thoracic spine. IMPRESSION: Chronic appearing increased lung markings with findings suggestive of mild pulmonary vascular congestion. Electronically Signed   By: Virgina Norfolk M.D.   On: 11/28/2020 00:27   DG Hip Unilat With Pelvis 2-3 Views Right  Result Date: 11/28/2020 CLINICAL DATA:  Status post fall. EXAM: DG HIP (WITH OR WITHOUT PELVIS) 2-3V RIGHT COMPARISON:  None. FINDINGS: Acute fracture deformity is seen extending through the neck of the proximal right femur. Approximately 1/2 shaft width inferior displacement of the distal fracture site is seen. There is no evidence of dislocation. Soft tissue structures are unremarkable. IMPRESSION: Acute fracture of the proximal right femur. Electronically Signed   By: Virgina Norfolk M.D.   On: 11/28/2020 00:26    Procedures Procedures   Medications Ordered in ED Medications  fentaNYL (SUBLIMAZE) injection 50 mcg (50 mcg Intravenous Given 11/28/20 0102)    ED Course  I have reviewed the triage vital signs and the nursing notes.  Pertinent labs & imaging results that were available during my care of the patient were reviewed by me and considered in my medical decision making (see chart for details).    MDM Rules/Calculators/A&P  R femoral neck  fracture. Workup otherwise ok. NPO. D/w Dr. Rolena Infante, plan for surgery in AM here at [facility]. D/w Dr. Tonie Griffith for admission. Updated POA multiple times throughout ED stay  Final Clinical Impression(s) / ED Diagnoses Final diagnoses:  Closed fracture of right hip, initial encounter [facility])    Rx / DC Orders ED Discharge Orders     None        Johanan Skorupski, Corene Cornea, MD 11/28/20 0140

## 2020-11-28 ENCOUNTER — Inpatient Hospital Stay (HOSPITAL_COMMUNITY): Payer: Medicare PPO

## 2020-11-28 ENCOUNTER — Encounter (HOSPITAL_COMMUNITY): Payer: Self-pay | Admitting: Family Medicine

## 2020-11-28 DIAGNOSIS — Z20822 Contact with and (suspected) exposure to covid-19: Secondary | ICD-10-CM | POA: Diagnosis present

## 2020-11-28 DIAGNOSIS — E785 Hyperlipidemia, unspecified: Secondary | ICD-10-CM | POA: Diagnosis present

## 2020-11-28 DIAGNOSIS — Z8673 Personal history of transient ischemic attack (TIA), and cerebral infarction without residual deficits: Secondary | ICD-10-CM | POA: Diagnosis not present

## 2020-11-28 DIAGNOSIS — E119 Type 2 diabetes mellitus without complications: Secondary | ICD-10-CM | POA: Diagnosis present

## 2020-11-28 DIAGNOSIS — Z79899 Other long term (current) drug therapy: Secondary | ICD-10-CM | POA: Diagnosis not present

## 2020-11-28 DIAGNOSIS — K219 Gastro-esophageal reflux disease without esophagitis: Secondary | ICD-10-CM | POA: Diagnosis present

## 2020-11-28 DIAGNOSIS — F0280 Dementia in other diseases classified elsewhere without behavioral disturbance: Secondary | ICD-10-CM | POA: Diagnosis present

## 2020-11-28 DIAGNOSIS — J9601 Acute respiratory failure with hypoxia: Secondary | ICD-10-CM | POA: Diagnosis present

## 2020-11-28 DIAGNOSIS — I10 Essential (primary) hypertension: Secondary | ICD-10-CM | POA: Diagnosis present

## 2020-11-28 DIAGNOSIS — J45909 Unspecified asthma, uncomplicated: Secondary | ICD-10-CM | POA: Diagnosis present

## 2020-11-28 DIAGNOSIS — S72011A Unspecified intracapsular fracture of right femur, initial encounter for closed fracture: Secondary | ICD-10-CM | POA: Diagnosis present

## 2020-11-28 DIAGNOSIS — W19XXXA Unspecified fall, initial encounter: Secondary | ICD-10-CM | POA: Diagnosis present

## 2020-11-28 DIAGNOSIS — D72829 Elevated white blood cell count, unspecified: Secondary | ICD-10-CM | POA: Diagnosis present

## 2020-11-28 DIAGNOSIS — S72001A Fracture of unspecified part of neck of right femur, initial encounter for closed fracture: Secondary | ICD-10-CM

## 2020-11-28 DIAGNOSIS — Z87891 Personal history of nicotine dependence: Secondary | ICD-10-CM | POA: Diagnosis not present

## 2020-11-28 DIAGNOSIS — G309 Alzheimer's disease, unspecified: Secondary | ICD-10-CM | POA: Diagnosis present

## 2020-11-28 DIAGNOSIS — J9811 Atelectasis: Secondary | ICD-10-CM | POA: Diagnosis present

## 2020-11-28 DIAGNOSIS — Z7982 Long term (current) use of aspirin: Secondary | ICD-10-CM | POA: Diagnosis not present

## 2020-11-28 DIAGNOSIS — Z66 Do not resuscitate: Secondary | ICD-10-CM | POA: Diagnosis present

## 2020-11-28 DIAGNOSIS — R5381 Other malaise: Secondary | ICD-10-CM | POA: Diagnosis present

## 2020-11-28 DIAGNOSIS — Z888 Allergy status to other drugs, medicaments and biological substances status: Secondary | ICD-10-CM | POA: Diagnosis not present

## 2020-11-28 DIAGNOSIS — Y92129 Unspecified place in nursing home as the place of occurrence of the external cause: Secondary | ICD-10-CM | POA: Diagnosis not present

## 2020-11-28 DIAGNOSIS — D62 Acute posthemorrhagic anemia: Secondary | ICD-10-CM | POA: Diagnosis present

## 2020-11-28 LAB — BASIC METABOLIC PANEL
Anion gap: 5 (ref 5–15)
Anion gap: 8 (ref 5–15)
BUN: 12 mg/dL (ref 8–23)
BUN: 12 mg/dL (ref 8–23)
CO2: 27 mmol/L (ref 22–32)
CO2: 29 mmol/L (ref 22–32)
Calcium: 8.8 mg/dL — ABNORMAL LOW (ref 8.9–10.3)
Calcium: 9 mg/dL (ref 8.9–10.3)
Chloride: 106 mmol/L (ref 98–111)
Chloride: 107 mmol/L (ref 98–111)
Creatinine, Ser: 0.48 mg/dL (ref 0.44–1.00)
Creatinine, Ser: 0.58 mg/dL (ref 0.44–1.00)
GFR, Estimated: >60 mL/min (ref >60)
GFR, Estimated: >60 mL/min (ref >60)
Glucose, Bld: 119 mg/dL — ABNORMAL HIGH (ref 70–99)
Glucose, Bld: 122 mg/dL — ABNORMAL HIGH (ref 70–99)
Potassium: 3.9 mmol/L (ref 3.5–5.1)
Potassium: 4.1 mmol/L (ref 3.5–5.1)
Sodium: 141 mmol/L (ref 135–145)
Sodium: 141 mmol/L (ref 135–145)

## 2020-11-28 LAB — CBC
HCT: 34.6 % — ABNORMAL LOW (ref 36.0–46.0)
Hemoglobin: 11.2 g/dL — ABNORMAL LOW (ref 12.0–15.0)
MCH: 30.3 pg (ref 26.0–34.0)
MCHC: 32.4 g/dL (ref 30.0–36.0)
MCV: 93.5 fL (ref 80.0–100.0)
Platelets: 205 10*3/uL (ref 150–400)
RBC: 3.7 MIL/uL — ABNORMAL LOW (ref 3.87–5.11)
RDW: 16.5 % — ABNORMAL HIGH (ref 11.5–15.5)
WBC: 14 10*3/uL — ABNORMAL HIGH (ref 4.0–10.5)
nRBC: 0 % (ref 0.0–0.2)

## 2020-11-28 LAB — TYPE AND SCREEN
ABO/RH(D): O POS
Antibody Screen: NEGATIVE

## 2020-11-28 LAB — SARS CORONAVIRUS 2 (TAT 6-24 HRS): SARS Coronavirus 2: NEGATIVE

## 2020-11-28 LAB — PROTIME-INR
INR: 1 (ref 0.8–1.2)
Prothrombin Time: 13.2 seconds (ref 11.4–15.2)

## 2020-11-28 LAB — CBG MONITORING, ED: Glucose-Capillary: 122 mg/dL — ABNORMAL HIGH (ref 70–99)

## 2020-11-28 MED ORDER — ACETAMINOPHEN 650 MG RE SUPP: 650.0000 mg | Freq: Four times a day (QID) | RECTAL | Status: DC | PRN

## 2020-11-28 MED ORDER — HYDRALAZINE HCL 20 MG/ML IJ SOLN
5.0000 mg | Freq: Four times a day (QID) | INTRAMUSCULAR | Status: DC | PRN
  Administered 2020-11-28: 5 mg via INTRAVENOUS
  Filled 2020-11-28: qty 1

## 2020-11-28 MED ORDER — FENTANYL CITRATE PF 50 MCG/ML IJ SOSY
50.0000 ug | PREFILLED_SYRINGE | Freq: Once | INTRAMUSCULAR | Status: AC
  Administered 2020-11-28: 50 ug via INTRAVENOUS
  Filled 2020-11-28: qty 1

## 2020-11-28 MED ORDER — ACETAMINOPHEN 325 MG PO TABS
650.0000 mg | ORAL_TABLET | Freq: Four times a day (QID) | ORAL | Status: DC | PRN
Start: 2020-11-28 — End: 2020-11-29

## 2020-11-28 MED ORDER — LACTATED RINGERS IV SOLN: INTRAVENOUS | Status: DC

## 2020-11-28 MED ORDER — MORPHINE SULFATE (PF) 2 MG/ML IV SOLN
2.0000 mg | INTRAVENOUS | Status: DC | PRN
  Administered 2020-11-28 – 2020-11-29 (×4): 2 mg via INTRAVENOUS
  Filled 2020-11-28 (×4): qty 1

## 2020-11-28 MED ORDER — FUROSEMIDE 10 MG/ML IJ SOLN
60.0000 mg | Freq: Once | INTRAMUSCULAR | Status: AC
  Administered 2020-11-28: 60 mg via INTRAVENOUS
  Filled 2020-11-28: qty 8

## 2020-11-28 MED ORDER — CHLORHEXIDINE GLUCONATE 4 % EX LIQD
60.0000 mL | Freq: Once | CUTANEOUS | Status: AC
  Administered 2020-11-29: 4 via TOPICAL
  Filled 2020-11-28: qty 15

## 2020-11-28 MED ORDER — SENNOSIDES-DOCUSATE SODIUM 8.6-50 MG PO TABS
1.0000 | ORAL_TABLET | Freq: Every evening | ORAL | Status: DC | PRN
  Filled 2020-11-28: qty 1

## 2020-11-28 MED ORDER — ENOXAPARIN SODIUM 30 MG/0.3ML IJ SOSY
30.0000 mg | PREFILLED_SYRINGE | Freq: Once | INTRAMUSCULAR | Status: AC
  Administered 2020-11-28: 30 mg via SUBCUTANEOUS
  Filled 2020-11-28: qty 0.3

## 2020-11-28 MED ORDER — HYDROMORPHONE HCL 1 MG/ML IJ SOLN
1.0000 mg | INTRAMUSCULAR | Status: DC | PRN
  Administered 2020-11-28: 1 mg via INTRAVENOUS
  Filled 2020-11-28: qty 1

## 2020-11-28 NOTE — ED Notes (Signed)
Amended diet to pureed with nectar thick

## 2020-11-28 NOTE — Progress Notes (Signed)
Patient ID: Grace Gilbert, female   DOB: 11-18-1932, 87 y.o.   MRN: [id number] Patient admitted early this morning for right femoral neck fracture after a fall.  Orthopedics planning for surgical repair today.  Patient seen and examined at bedside.  I have reviewed patient's medical records including this morning's H&P, current vitals, labs, medications myself.  Keep patient NPO.  Continue pain management.  Follow orthopedics recommendations.

## 2020-11-28 NOTE — Plan of Care (Signed)
  Problem: Clinical Measurements: Goal: Will remain free from infection Outcome: Progressing Goal: Diagnostic test results will improve Outcome: Progressing   Problem: Pain Managment: Goal: General experience of comfort will improve Outcome: Progressing   Problem: Education: Goal: Knowledge of General Education information will improve Description: Including pain rating scale, medication(s)/side effects and non-pharmacologic comfort measures Outcome: Not Progressing   Problem: Activity: Goal: Risk for activity intolerance will decrease Outcome: Not Progressing   Problem: Nutrition: Goal: Adequate nutrition will be maintained Outcome: Not Progressing

## 2020-11-28 NOTE — H&P (View-Only) (Signed)
Patient ID: Grace Gilbert MRN: [id number] DOB/AGE: 1933/03/20 87 y.o.  Admit date: 11/27/2020  Admission Diagnoses:  Principal Problem:   Closed displaced fracture of right femoral neck ([place]) Active Problems:   Hypertension   Alzheimer's dementia ([name])   Normocytic anemia   HPI: Grace Gilbert is a 87 y.o. female with medical history significant for hypertension, hyperlipidemia, Alzheimer's dementia, nonverbal who presents by EMS after having a fall at the skilled nursing facility.  Reportedly Grace Gilbert fell and landed on right side. No LOC or reported head trauma. Has cried out if right hip or leg touched or moved.  No report of any fever, nausea vomiting or diarrhea.  Patient is unable to provide any history secondary to dementia and is nonverbal at baseline. Orthopedics asked to consult in regards to right hip fracture.   Past Medical History: Past Medical History:  Diagnosis Date   Abnormal CT scan, chest September 2011   Scar tissue   Allergic rhinitis    Alzheimer's dementia ([place])    Asthma    Diabetes mellitus    borderline and under control-diet ,exercise   Esophageal stricture    Family history of colon cancer    Mother   GERD (gastroesophageal reflux disease)    Hiatal hernia    HLD (hyperlipidemia)    Hypertension    Osteopenia    Pneumonia 12-15 yrs.ago   yrs. ago   TIA (transient ischemic attack)    Vitamin D deficiency     Surgical History: Past Surgical History:  Procedure Laterality Date   ABDOMINAL HYSTERECTOMY  1980    BALLOON DILATION N/A 09/27/2018   Procedure: BALLOON DILATION;  Surgeon: Jackquline Denmark, MD;  Location: [facility];  Service: Endoscopy;  Laterality: N/A;   BIOPSY  09/27/2018   Procedure: BIOPSY;  Surgeon: Jackquline Denmark, MD;  Location: [facility];  Service: Endoscopy;;   CATARACT EXTRACTION Left 2014   ESOPHAGOGASTRODUODENOSCOPY (EGD) WITH PROPOFOL N/A 09/27/2018   Procedure: ESOPHAGOGASTRODUODENOSCOPY (EGD) WITH PROPOFOL;   Surgeon: Jackquline Denmark, MD;  Location: [facility];  Service: Endoscopy;  Laterality: N/A;   SHOULDER OPEN ROTATOR CUFF REPAIR  05/22/2011   Procedure: ROTATOR CUFF REPAIR SHOULDER OPEN;  Surgeon: Tobi Bastos, MD;  Location: [facility];  Service: Orthopedics;  Laterality: Left;   THORACOTOMY / DECORTICATION PARIETAL PLEURA Right 204   empyema   TONSILLECTOMY      Family History: History reviewed. No pertinent family history.  Social History: Social History   Socioeconomic History   Marital status: Single    Spouse name: Not on file   Number of children: Not on file   Years of education: Not on file   Highest education level: Not on file  Occupational History   Occupation: retired Pharmacist, hospital  Tobacco Use   Smoking status: Former    Packs/day: 1.50    Years: 20.00    Pack years: 30.00    Types: Cigarettes    Quit date: 05/15/1987    Years since quitting: 33.5   Smokeless tobacco: Never  Vaping Use   Vaping Use: Never used  Substance and Sexual Activity   Alcohol use: No    Alcohol/week: 1.0 standard drink    Types: 1 Glasses of wine per week   Drug use: No   Sexual activity: Never  Other Topics Concern   Not on file  Social History Narrative   Lives at [facility] since 07/27/2014   Previous math Pharmacist, hospital.   Never married  Former smoker stopped 1989   Alcohol occasionally    POA niece Terance Hart [place], [place]   Social Determinants of Health   Financial Resource Strain: Not on file  Food Insecurity: Not on file  Transportation Needs: Not on file  Physical Activity: Not on file  Stress: Not on file  Social Connections: Not on file  Intimate Partner Violence: Not on file    Allergies: Aricept [donepezil hcl], Crestor [rosuvastatin calcium], Lipitor [atorvastatin calcium], Lovastatin, Micardis [telmisartan], Sertraline, Welchol [colesevelam hcl], Zetia [ezetimibe], and Zocor [simvastatin]  Medications: I have reviewed the patient's current  medications.  Vital Signs: Patient Vitals for the past 24 hrs:  BP Temp Temp src Pulse Resp SpO2  11/28/20 0630 (!) 161/68 -- -- 66 (!) 21 99 %  11/28/20 0600 (!) 166/67 -- -- 64 17 99 %  11/28/20 0430 (!) 166/75 -- -- 66 18 98 %  11/28/20 0400 (!) 159/75 -- -- 66 (!) 21 99 %  11/28/20 0330 (!) 162/83 -- -- 67 (!) 23 99 %  11/28/20 0300 (!) 168/74 -- -- 68 18 97 %  11/28/20 0230 (!) 152/87 -- -- 67 20 99 %  11/28/20 0200 (!) 161/74 -- -- 66 17 99 %  11/28/20 0130 (!) 144/71 -- -- 67 17 98 %  11/28/20 0100 (!) 175/123 -- -- 71 15 98 %  11/28/20 0030 (!) 159/87 -- -- 70 19 94 %  11/28/20 0015 (!) 155/131 -- -- 70 18 94 %  11/28/20 0000 -- -- -- (!) 52 (!) 22 97 %  11/27/20 2345 (!) 202/109 -- -- 72 -- 98 %  11/27/20 2330 (!) 175/95 -- -- 73 -- 99 %  11/27/20 2315 (!) 166/73 -- -- 71 -- 98 %  11/27/20 2300 (!) 141/70 -- -- 71 -- 98 %  11/27/20 2254 (!) 170/75 97.9 F (36.6 C) Oral 73 18 96 %    Radiology: DG Chest 1 View  Result Date: 11/28/2020 CLINICAL DATA:  Status post fall. EXAM: CHEST  1 VIEW COMPARISON:  September 25, 2018 FINDINGS: Low lung volumes are seen with mild, diffuse, chronic appearing increased lung markings. Mild prominence of the pulmonary vasculature is noted. There is no evidence of an acute infiltrate, pleural effusion or pneumothorax. The heart size and mediastinal contours are within normal limits. There is moderate severity calcification of the aortic arch. Degenerative changes are seen throughout the thoracic spine. IMPRESSION: Chronic appearing increased lung markings with findings suggestive of mild pulmonary vascular congestion. Electronically Signed   By: Virgina Norfolk M.D.   On: 11/28/2020 00:27   DG Hip Unilat With Pelvis 2-3 Views Right  Result Date: 11/28/2020 CLINICAL DATA:  Status post fall. EXAM: DG HIP (WITH OR WITHOUT PELVIS) 2-3V RIGHT COMPARISON:  None. FINDINGS: Acute fracture deformity is seen extending through the neck of the proximal right  femur. Approximately 1/2 shaft width inferior displacement of the distal fracture site is seen. There is no evidence of dislocation. Soft tissue structures are unremarkable. IMPRESSION: Acute fracture of the proximal right femur. Electronically Signed   By: Virgina Norfolk M.D.   On: 11/28/2020 00:26    Labs: Recent Labs    11/27/20 2340 11/28/20 0520  WBC 15.0* 14.0*  RBC 3.90 3.70*  HCT 36.1 34.6*  PLT 223 205   Recent Labs    11/27/20 2340 11/28/20 0520  NA 141 141  K 3.9 4.1  CL 106 107  CO2 27 29  BUN 12 12  CREATININE 0.58 0.48  GLUCOSE 119* 122*  CALCIUM 9.0 8.8*   Recent Labs    11/27/20 2340  INR 1.0    Review of Systems: Review of Systems  Unable to perform ROS: Dementia   Physical Exam: Right hip abducted, externall rotated. Pain over right hip. No skin lesions Distal pulse intact.  No lower leg swelling. Extremities warm and well profused.  Patient demented and unable to follow commands-- will not plantar flex and dorsiflex. Will not respond and let me know if she has sensation to light touch.   Assessment and Plan: Acute right femoral neck fracture, neurovascularly in tact.   Will require surgical fixation. Will discuss timing and exact plan with Dr. Lyla Glassing.     [facility] Elnoria Livingston PA-C [facility]

## 2020-11-28 NOTE — ED Notes (Signed)
Gave 1 mg dilaudid at 10:13, O2 sats dropped to 78. Increased [place] to 6,  Then applied non-rebreather at 15, then removed NRB after O2 was back in 90s. Now on [place] at 6 satting at 62.

## 2020-11-28 NOTE — Consult Note (Signed)
Patient ID: Grace Gilbert MRN: [id number] DOB/AGE: [birth year] 85 y.o.  Admit date: 11/27/2020  Admission Diagnoses:  Principal Problem:   Closed displaced fracture of right femoral neck ([place]) Active Problems:   Hypertension   Alzheimer's dementia ([place])   Normocytic anemia   HPI: Grace Gilbert is a 87 y.o. female with medical history significant for hypertension, hyperlipidemia, Alzheimer's dementia, nonverbal who presents by EMS after having a fall at the skilled nursing facility.  Reportedly Grace Gilbert fell and landed on right side. No LOC or reported head trauma. Has cried out if right hip or leg touched or moved.  No report of any fever, nausea vomiting or diarrhea.  Patient is unable to provide any history secondary to dementia and is nonverbal at baseline. Orthopedics asked to consult in regards to right hip fracture.   Past Medical History: Past Medical History:  Diagnosis Date   Abnormal CT scan, chest September 2011   Scar tissue   Allergic rhinitis    Alzheimer's dementia ([place])    Asthma    Diabetes mellitus    borderline and under control-diet ,exercise   Esophageal stricture    Family history of colon cancer    Mother   GERD (gastroesophageal reflux disease)    Hiatal hernia    HLD (hyperlipidemia)    Hypertension    Osteopenia    Pneumonia 12-15 yrs.ago   yrs. ago   TIA (transient ischemic attack)    Vitamin D deficiency     Surgical History: Past Surgical History:  Procedure Laterality Date   ABDOMINAL HYSTERECTOMY  1980    BALLOON DILATION N/A 09/27/2018   Procedure: BALLOON DILATION;  Surgeon: Jackquline Denmark, MD;  [facility]: [facility];  Service: Endoscopy;  Laterality: N/A;   BIOPSY  09/27/2018   Procedure: BIOPSY;  Surgeon: Jackquline Denmark, MD;  [facility]: [facility];  Service: Endoscopy;;   CATARACT EXTRACTION Left 2014   ESOPHAGOGASTRODUODENOSCOPY (EGD) WITH PROPOFOL N/A 09/27/2018   Procedure: ESOPHAGOGASTRODUODENOSCOPY (EGD) WITH PROPOFOL;   Surgeon: Jackquline Denmark, MD;  [facility];  Service: Endoscopy;  Laterality: N/A;   SHOULDER OPEN ROTATOR CUFF REPAIR  05/22/2011   Procedure: ROTATOR CUFF REPAIR SHOULDER OPEN;  Surgeon: Tobi Bastos, MD;  Location: [facility];  Service: Orthopedics;  Laterality: Left;   THORACOTOMY / DECORTICATION PARIETAL PLEURA Right 204   empyema   TONSILLECTOMY      Family History: History reviewed. No pertinent family history.  Social History: Social History   Socioeconomic History   Marital status: Single    Spouse name: Not on file   Number of children: Not on file   Years of education: Not on file   Highest education level: Not on file  Occupational History   Occupation: retired Pharmacist, hospital  Tobacco Use   Smoking status: Former    Packs/day: 1.50    Years: 20.00    Pack years: 30.00    Types: Cigarettes    Quit date: 05/15/1987    Years since quitting: 33.5   Smokeless tobacco: Never  Vaping Use   Vaping Use: Never used  Substance and Sexual Activity   Alcohol use: No    Alcohol/week: 1.0 standard drink    Types: 1 Glasses of wine per week   Drug use: No   Sexual activity: Never  Other Topics Concern   Not on file  Social History Narrative   [facility] since 07/27/2014   Previous math Pharmacist, hospital.   Never married  Former smoker stopped 1989   Alcohol occasionally    POA niece Terance Hart [place], [place]   Social Determinants of Health   Financial Resource Strain: Not on file  Food Insecurity: Not on file  Transportation Needs: Not on file  Physical Activity: Not on file  Stress: Not on file  Social Connections: Not on file  Intimate Partner Violence: Not on file    Allergies: Aricept [donepezil hcl], Crestor [rosuvastatin calcium], Lipitor [atorvastatin calcium], Lovastatin, Micardis [telmisartan], Sertraline, Welchol [colesevelam hcl], Zetia [ezetimibe], and Zocor [simvastatin]  Medications: I have reviewed the patient's current  medications.  Vital Signs: Patient Vitals for the past 24 hrs:  BP Temp Temp src Pulse Resp SpO2  11/28/20 0630 (!) 161/68 -- -- 66 (!) 21 99 %  11/28/20 0600 (!) 166/67 -- -- 64 17 99 %  11/28/20 0430 (!) 166/75 -- -- 66 18 98 %  11/28/20 0400 (!) 159/75 -- -- 66 (!) 21 99 %  11/28/20 0330 (!) 162/83 -- -- 67 (!) 23 99 %  11/28/20 0300 (!) 168/74 -- -- 68 18 97 %  11/28/20 0230 (!) 152/87 -- -- 67 20 99 %  11/28/20 0200 (!) 161/74 -- -- 66 17 99 %  11/28/20 0130 (!) 144/71 -- -- 67 17 98 %  11/28/20 0100 (!) 175/123 -- -- 71 15 98 %  11/28/20 0030 (!) 159/87 -- -- 70 19 94 %  11/28/20 0015 (!) 155/131 -- -- 70 18 94 %  11/28/20 0000 -- -- -- (!) 52 (!) 22 97 %  11/27/20 2345 (!) 202/109 -- -- 72 -- 98 %  11/27/20 2330 (!) 175/95 -- -- 73 -- 99 %  11/27/20 2315 (!) 166/73 -- -- 71 -- 98 %  11/27/20 2300 (!) 141/70 -- -- 71 -- 98 %  11/27/20 2254 (!) 170/75 97.9 F (36.6 C) Oral 73 18 96 %    Radiology: DG Chest 1 View  Result Date: 11/28/2020 CLINICAL DATA:  Status post fall. EXAM: CHEST  1 VIEW COMPARISON:  September 25, 2018 FINDINGS: Low lung volumes are seen with mild, diffuse, chronic appearing increased lung markings. Mild prominence of the pulmonary vasculature is noted. There is no evidence of an acute infiltrate, pleural effusion or pneumothorax. The heart size and mediastinal contours are within normal limits. There is moderate severity calcification of the aortic arch. Degenerative changes are seen throughout the thoracic spine. IMPRESSION: Chronic appearing increased lung markings with findings suggestive of mild pulmonary vascular congestion. Electronically Signed   By: Virgina Norfolk M.D.   On: 11/28/2020 00:27   DG Hip Unilat With Pelvis 2-3 Views Right  Result Date: 11/28/2020 CLINICAL DATA:  Status post fall. EXAM: DG HIP (WITH OR WITHOUT PELVIS) 2-3V RIGHT COMPARISON:  None. FINDINGS: Acute fracture deformity is seen extending through the neck of the proximal right  femur. Approximately 1/2 shaft width inferior displacement of the distal fracture site is seen. There is no evidence of dislocation. Soft tissue structures are unremarkable. IMPRESSION: Acute fracture of the proximal right femur. Electronically Signed   By: Virgina Norfolk M.D.   On: 11/28/2020 00:26    Labs: Recent Labs    11/27/20 2340 11/28/20 0520  WBC 15.0* 14.0*  RBC 3.90 3.70*  HCT 36.1 34.6*  PLT 223 205   Recent Labs    11/27/20 2340 11/28/20 0520  NA 141 141  K 3.9 4.1  CL 106 107  CO2 27 29  BUN 12 12  CREATININE 0.58 0.48  GLUCOSE 119* 122*  CALCIUM 9.0 8.8*   Recent Labs    11/27/20 2340  INR 1.0    Review of Systems: Review of Systems  Unable to perform ROS: Dementia   Physical Exam: Right hip abducted, externall rotated. Pain over right hip. No skin lesions Distal pulse intact.  No lower leg swelling. Extremities warm and well profused.  Patient demented and unable to follow commands-- will not plantar flex and dorsiflex. Will not respond and let me know if she has sensation to light touch.   Assessment and Plan: Acute right femoral neck fracture, neurovascularly in tact.   Will require surgical fixation. Will discuss timing and exact plan with Dr. Lyla Glassing.     [facility] Yazid Pop PA-C [facility]

## 2020-11-28 NOTE — ED Notes (Signed)
Pt back from x-ray.

## 2020-11-28 NOTE — ED Notes (Signed)
Checked on patient. Was drowsy, and unable to answer any questions due to aphasia.

## 2020-11-28 NOTE — ED Notes (Signed)
Patient is ready for transport.  

## 2020-11-28 NOTE — ED Notes (Signed)
Doctor Chantil Stables cousin and [place] of patient  [phone] Would like a call back a call back

## 2020-11-28 NOTE — H&P (Signed)
History and Physical    DESHALA POINT [id number] DOB: [birth year] DOA: 11/27/2020  PCP: [name], MD   Patient coming from: SNF  Chief Complaint: Fall at facility, right hip pain  HPI: Grace Gilbert is a 87 y.o. female with medical history significant for hypertension, hyperlipidemia, Alzheimer's dementia, nonverbal who presents by EMS after having a fall at the skilled nursing facility.  Reportedly Ms. Carbonneau fell and landed on right side. No LOC or reported head trauma. Has cried out if right hip or leg touched or moved.  No report of any fever, nausea vomiting or diarrhea.  Patient is unable to provide any history secondary to dementia and is nonverbal at baseline.   ED Course: Ms Desouza has been hemodynamically stable in the emergency room with elevated blood pressure readings.  BMP is unremarkable.  CBC shows a WBC of 15,000 hemoglobin 11.8 hematocrit 36.1 platelets 223,000.  No signs of infection.  WBC has been slightly elevated secondary to left shift after trauma of fall with fractured right hip.  INR 1.0.  COVID swab pending.  Orthopedic surgeries been consulted by the ER physician and hospitalist service has been asked to admit for further management  Review of Systems: Unable to assess secondary to dementia  Past Medical History:  Diagnosis Date   Abnormal CT scan, chest September 2011   Scar tissue   Allergic rhinitis    Alzheimer's dementia ([place])    Asthma    Diabetes mellitus    borderline and under control-diet ,exercise   Esophageal stricture    Family history of colon cancer    Mother   GERD (gastroesophageal reflux disease)    Hiatal hernia    HLD (hyperlipidemia)    Hypertension    Osteopenia    Pneumonia 12-15 yrs.ago   yrs. ago   TIA (transient ischemic attack)    Vitamin D deficiency     Past Surgical History:  Procedure Laterality Date   ABDOMINAL HYSTERECTOMY  1980    BALLOON DILATION N/A 09/27/2018   Procedure: BALLOON DILATION;   Surgeon: Jackquline Denmark, MD;  Location: [facility];  Service: Endoscopy;  Laterality: N/A;   BIOPSY  09/27/2018   Procedure: BIOPSY;  Surgeon: Jackquline Denmark, MD;  Location: [facility];  Service: Endoscopy;;   CATARACT EXTRACTION Left 2014   ESOPHAGOGASTRODUODENOSCOPY (EGD) WITH PROPOFOL N/A 09/27/2018   Procedure: ESOPHAGOGASTRODUODENOSCOPY (EGD) WITH PROPOFOL;  Surgeon: Jackquline Denmark, MD;  Location: [facility];  Service: Endoscopy;  Laterality: N/A;   SHOULDER OPEN ROTATOR CUFF REPAIR  05/22/2011   Procedure: ROTATOR CUFF REPAIR SHOULDER OPEN;  Surgeon: Tobi Bastos, MD;  Location: [facility];  Service: Orthopedics;  Laterality: Left;   THORACOTOMY / DECORTICATION PARIETAL PLEURA Right 204   empyema   TONSILLECTOMY      Social History  reports that she quit smoking about 33 years ago. Her smoking use included cigarettes. She has a 30.00 pack-year smoking history. She has never used smokeless tobacco. She reports that she does not drink alcohol and does not use drugs.  Allergies  Allergen Reactions   Aricept [Donepezil Hcl] Diarrhea   Crestor [Rosuvastatin Calcium]     Myalgia   Lipitor [Atorvastatin Calcium]    Lovastatin     Myalgia   Micardis [Telmisartan]     Fatigue   Sertraline Diarrhea   Welchol [Colesevelam Hcl]     Constipation   Zetia [Ezetimibe]     Myalgia   Zocor [Simvastatin]     Myalgia  History reviewed. No pertinent family history.   Prior to Admission medications   Medication Sig Start Date End Date Taking? Authorizing Provider  acetaminophen (TYLENOL) 500 MG tablet Take 2 tablets (1,000 mg total) by mouth in the morning and at bedtime. 11/27/20   Fargo, Amy E, NP  acetaminophen (TYLENOL) 500 MG tablet Take 2 tablets (1,000 mg total) by mouth daily as needed. 11/27/20   [name], NP  aspirin EC 81 MG tablet Take 81 mg by mouth daily.    [provider]  Calcium 600-200 MG-UNIT tablet Take 1 tablet by mouth daily. In the morning 7-11 am     [provider]  cetirizine (ZYRTEC) 10 MG tablet Take 10 mg by mouth daily. 10/19/18   [provider]  CHLORHEXIDINE GLUCONATE MT Use as directed 1 Dose in the mouth or throat daily. 1 tsp of solution to each teeth and gums with a toothbrush. Spit excess and do not rinse.    [provider]  escitalopram (LEXAPRO) 10 MG tablet Take 1 tablet (10 mg total) by mouth daily. 06/04/19   [name], MD  memantine (NAMENDA XR) 28 MG CP24 24 hr capsule TAKE ONE CAPSULE ONCE A DAY TO PRESERVE MEMORY. 02/03/18   Ngetich, Dinah C, NP  metoCLOPramide (REGLAN) 5 MG tablet Take 5 mg by mouth in the morning and at bedtime.    [provider]  Multiple Vitamins-Minerals (PRESERVISION AREDS 2) CHEW Chew 1 tablet by mouth in the morning and at bedtime.    [provider]  NON FORMULARY Med Pass 2.0 liquid; 2.0; amt: 180ML.; oral Special Instructions: Med Pass 2.0 180ML QD @ 2 PM. Doc. % consumed. Once a day    [provider]  NON FORMULARY Med Pass 2.0 liquid; 2.0; amt: 180ML.; oral Special Instructions: Med Pass 2.0 180ML QD @ 2 PM. Doc. % consumed. Three times a day    [provider]  omeprazole (PRILOSEC) 20 MG capsule Take 20 mg by mouth daily. Do not crush; Open capsule and sprinkle on applesauce 11/09/18   [provider]  rivastigmine (EXELON) 9.5 mg/24hr Apply fresh patch daily and remove old patch to help preserve memory 10/07/17   Blanchie Serve, MD  Sodium Fluoride (PREVIDENT 5000 BOOSTER PLUS) 1.1 % PSTE Place 1 application onto teeth every evening.    [provider]  zinc oxide 20 % ointment Apply 1 application topically as needed for irritation. Apply to buttocks/peri topical as needed    [provider]    Physical Exam: Vitals:   11/28/20 0100 11/28/20 0130 11/28/20 0200 11/28/20 0230  BP: (!) 175/123 (!) 144/71 (!) 161/74 (!) 152/87  Pulse: 71 67 66 67  Resp: '15 17 17 20  '$ Temp:      TempSrc:       SpO2: 98% 98% 99% 99%    Constitutional: NAD Vitals:   11/28/20 0100 11/28/20 0130 11/28/20 0200 11/28/20 0230  BP: (!) 175/123 (!) 144/71 (!) 161/74 (!) 152/87  Pulse: 71 67 66 67  Resp: '15 17 17 20  '$ Temp:      TempSrc:      SpO2: 98% 98% 99% 99%   General: WDWN Eyes PERRL, conjunctivae normal.  Sclera nonicteric HENT:  [place]/AT, external ears normal.  Nares patent without epistasis.  Mucous membranes are moist.  Neck: Soft, normal passive range of motion, supple, no masses, Trachea midline Respiratory: clear to auscultation bilaterally, no wheezing, no crackles. Normal respiratory effort. No accessory  muscle use.  Cardiovascular: Regular rate and rhythm, no murmurs / rubs / gallops. +1 DP pulses bilaterally.  Abdomen: Soft, nondistended,  No masses palpated. Bowel sounds normoactive Musculoskeletal:  Right leg shortened and external rotated. No cyanosis. Normal muscle tone.  Skin: Warm, dry, intact no rashes, lesions, ulcers. No induration Neurologic: Cannot fully assess due to dementia and pt is nonverbal. Withdraws from painful stimuli.    Labs on Admission: I have personally reviewed following labs and imaging studies  CBC: Recent Labs  Lab 11/27/20 2340  WBC 15.0*  NEUTROABS 11.1*  HGB 11.8*  HCT 36.1  MCV 92.6  PLT [id number]    Basic Metabolic Panel: Recent Labs  Lab 11/27/20 2340  NA 141  K 3.9  CL 106  CO2 27  GLUCOSE 119*  BUN 12  CREATININE 0.58  CALCIUM 9.0    GFR: Estimated Creatinine Clearance: 43.6 mL/min (by C-G formula based on SCr of 0.58 mg/dL).  Liver Function Tests: No results for input(s): AST, ALT, ALKPHOS, BILITOT, PROT, ALBUMIN in the last 168 hours.  Urine analysis:    Component Value Date/Time   COLORURINE YELLOW 09/19/2018 0607   APPEARANCEUR HAZY (A) 09/19/2018 0607   LABSPEC 1.018 09/19/2018 0607   PHURINE 5.0 09/19/2018 0607   GLUCOSEU NEGATIVE 09/19/2018 0607   HGBUR SMALL (A) 09/19/2018 0607   BILIRUBINUR NEGATIVE  09/19/2018 0607   KETONESUR NEGATIVE 09/19/2018 0607   PROTEINUR NEGATIVE 09/19/2018 0607   UROBILINOGEN 0.2 05/15/2011 1358   NITRITE NEGATIVE 09/19/2018 0607   LEUKOCYTESUR TRACE (A) 09/19/2018 0607    Radiological Exams on Admission: DG Chest 1 View  Result Date: 11/28/2020 CLINICAL DATA:  Status post fall. EXAM: CHEST  1 VIEW COMPARISON:  September 25, 2018 FINDINGS: Low lung volumes are seen with mild, diffuse, chronic appearing increased lung markings. Mild prominence of the pulmonary vasculature is noted. There is no evidence of an acute infiltrate, pleural effusion or pneumothorax. The heart size and mediastinal contours are within normal limits. There is moderate severity calcification of the aortic arch. Degenerative changes are seen throughout the thoracic spine. IMPRESSION: Chronic appearing increased lung markings with findings suggestive of mild pulmonary vascular congestion. Electronically Signed   By: Virgina Norfolk M.D.   On: 11/28/2020 00:27   DG Hip Unilat With Pelvis 2-3 Views Right  Result Date: 11/28/2020 CLINICAL DATA:  Status post fall. EXAM: DG HIP (WITH OR WITHOUT PELVIS) 2-3V RIGHT COMPARISON:  None. FINDINGS: Acute fracture deformity is seen extending through the neck of the proximal right femur. Approximately 1/2 shaft width inferior displacement of the distal fracture site is seen. There is no evidence of dislocation. Soft tissue structures are unremarkable. IMPRESSION: Acute fracture of the proximal right femur. Electronically Signed   By: Virgina Norfolk M.D.   On: 11/28/2020 00:26    EKG: Independently reviewed.  EKG shows atrial fibrillation but rhythm appears regular and P waves can be seen in a choppy baseline.  We will repeat EKG at this time.  There is no ST elevation or depression on the original EKG with a QTC of 475  Assessment/Plan Principal Problem:   Closed displaced fracture of right femoral neck  Ms. Denham is admitted to med-surg floor.   Orthopedics consulted by ER physician and plan to repair hip fracture on 11/28/20.  Pt will be kept NPO for impending surgery.  Pain control provided with prn dilaudid. IVF hydration of LR at 75 ml/hr Recheck electrolytes and renal function in am  Active Problems:  Hypertension Monitor BP. Will provide prn antihypertensive as needed.     Alzheimer's dementia Chronic    Normocytic anemia Chronic and stable. Recheck CBC in am    DVT prophylaxis: SCDs for DVT prophylaxis with impending surgical repair of hip fracture by Ortho  Code Status:   DNR  Family Communication:  No family at bedside Disposition Plan:   Patient is from:  SNF  Anticipated DC to:  SNF  Anticipated DC date:  Anticipate 2 midnight or more stay in hospital  Consults called:  Orthopedic surgery-Dr. Laurelyn Sickle physician consulted and spoke to Ortho Admission status:  Inpatient  Yevonne Aline Jamario Colina MD [facility]  How to contact the [facility] Attending or Consulting provider [place] or covering provider during after hours [place], for this patient?   Check the care team in [facility] and look for a) attending/consulting [facility] provider listed and b) the [facility] team listed Log into [email or web address] and use [place]'s universal password to access. If you do not have the password, please contact the hospital operator. Locate the [facility] provider you are looking for under [facility] and page to a number that you can be directly reached. If you still have difficulty reaching the provider, please page the [facility] (Director on Call) for the Hospitalists listed on amion for assistance.  11/28/2020, 3:03 AM

## 2020-11-28 NOTE — Progress Notes (Signed)
Presented from SNF after GLF with displaced R femoral neck fracture. Asked by Dr. Rolena Infante to take over. Plan for surgery tomorrow. Can have diet today; NPO after MN tonight. OTO lovenox ordered for today, then hold chemical DVT ppx. Will also obtain R knee x-rays.

## 2020-11-28 NOTE — ED Notes (Signed)
Pt to xray

## 2020-11-29 ENCOUNTER — Inpatient Hospital Stay (HOSPITAL_COMMUNITY): Payer: Medicare PPO | Admitting: Anesthesiology

## 2020-11-29 ENCOUNTER — Inpatient Hospital Stay (HOSPITAL_COMMUNITY): Payer: Medicare PPO

## 2020-11-29 ENCOUNTER — Encounter (HOSPITAL_COMMUNITY)
Admission: EM | Disposition: A | Discharge status: Skilled Nursing Facility | Payer: Self-pay | Source: Skilled Nursing Facility | Attending: Internal Medicine

## 2020-11-29 HISTORY — PX: ANTERIOR APPROACH HEMI HIP ARTHROPLASTY: SHX6690

## 2020-11-29 LAB — GLUCOSE, CAPILLARY
Glucose-Capillary: 191 mg/dL — ABNORMAL HIGH (ref 70–99)
Glucose-Capillary: 187 mg/dL — ABNORMAL HIGH (ref 70–99)

## 2020-11-29 LAB — URINALYSIS, ROUTINE W REFLEX MICROSCOPIC
Bilirubin Urine: NEGATIVE
Glucose, UA: NEGATIVE mg/dL
Ketones, ur: NEGATIVE mg/dL
Nitrite: NEGATIVE
Specific Gravity, Urine: >1.03 — ABNORMAL HIGH (ref 1.005–1.030)
pH: 5.5 (ref 5.0–8.0)

## 2020-11-29 LAB — CBC WITH DIFFERENTIAL/PLATELET
Abs Immature Granulocytes: 0.15 10*3/uL — ABNORMAL HIGH (ref 0.00–0.07)
Basophils Absolute: 0.1 10*3/uL (ref 0.0–0.1)
Basophils Relative: 0 %
Eosinophils Absolute: 0.3 10*3/uL (ref 0.0–0.5)
Eosinophils Relative: 1 %
HCT: 38 % (ref 36.0–46.0)
Hemoglobin: 12.2 g/dL (ref 12.0–15.0)
Immature Granulocytes: 1 %
Lymphocytes Relative: 7 %
Lymphs Abs: 1.4 10*3/uL (ref 0.7–4.0)
MCH: 30.3 pg (ref 26.0–34.0)
MCHC: 32.1 g/dL (ref 30.0–36.0)
MCV: 94.5 fL (ref 80.0–100.0)
Monocytes Absolute: 2.2 10*3/uL — ABNORMAL HIGH (ref 0.1–1.0)
Monocytes Relative: 11 %
Neutro Abs: 16.5 10*3/uL — ABNORMAL HIGH (ref 1.7–7.7)
Neutrophils Relative %: 80 %
Platelets: 224 10*3/uL (ref 150–400)
RBC: 4.02 MIL/uL (ref 3.87–5.11)
RDW: 16.6 % — ABNORMAL HIGH (ref 11.5–15.5)
WBC: 20.6 10*3/uL — ABNORMAL HIGH (ref 4.0–10.5)
nRBC: 0 % (ref 0.0–0.2)

## 2020-11-29 LAB — SURGICAL PCR SCREEN
MRSA, PCR: NEGATIVE
MRSA, PCR: NEGATIVE
Staphylococcus aureus: POSITIVE — AB
Staphylococcus aureus: POSITIVE — AB

## 2020-11-29 LAB — BASIC METABOLIC PANEL
Anion gap: 10 (ref 5–15)
BUN: 22 mg/dL (ref 8–23)
CO2: 30 mmol/L (ref 22–32)
Calcium: 9.1 mg/dL (ref 8.9–10.3)
Chloride: 103 mmol/L (ref 98–111)
Creatinine, Ser: 0.66 mg/dL (ref 0.44–1.00)
GFR, Estimated: >60 mL/min (ref >60)
Glucose, Bld: 144 mg/dL — ABNORMAL HIGH (ref 70–99)
Potassium: 3.7 mmol/L (ref 3.5–5.1)
Sodium: 143 mmol/L (ref 135–145)

## 2020-11-29 LAB — URINALYSIS, MICROSCOPIC (REFLEX)

## 2020-11-29 LAB — MAGNESIUM: Magnesium: 2.1 mg/dL (ref 1.7–2.4)

## 2020-11-29 SURGERY — HEMIARTHROPLASTY, HIP, DIRECT ANTERIOR APPROACH, FOR FRACTURE
Anesthesia: General | Laterality: Right

## 2020-11-29 MED ORDER — KETOROLAC TROMETHAMINE 30 MG/ML IJ SOLN
INTRAMUSCULAR | Status: AC
  Filled 2020-11-29: qty 1

## 2020-11-29 MED ORDER — ONDANSETRON HCL 4 MG/2ML IJ SOLN
INTRAMUSCULAR | Status: DC | PRN
  Administered 2020-11-29: 4 mg via INTRAVENOUS

## 2020-11-29 MED ORDER — POLYETHYLENE GLYCOL 3350 17 G PO PACK
17.0000 g | PACK | Freq: Every day | ORAL | Status: DC | PRN
  Filled 2020-11-29: qty 1

## 2020-11-29 MED ORDER — ONDANSETRON HCL 4 MG/2ML IJ SOLN
INTRAMUSCULAR | Status: AC
  Filled 2020-11-29: qty 2

## 2020-11-29 MED ORDER — MORPHINE SULFATE (PF) 2 MG/ML IV SOLN: 0.5000 mg | INTRAVENOUS | Status: DC | PRN

## 2020-11-29 MED ORDER — MEMANTINE HCL ER 28 MG PO CP24
28.0000 mg | ORAL_CAPSULE | Freq: Every day | ORAL | Status: DC
  Administered 2020-11-30 – 2020-12-01 (×2): 28 mg via ORAL
  Filled 2020-11-29 (×3): qty 1

## 2020-11-29 MED ORDER — TRANEXAMIC ACID-NACL 1000-0.7 MG/100ML-% IV SOLN
1000.0000 mg | INTRAVENOUS | Status: AC
  Administered 2020-11-29: 1000 mg via INTRAVENOUS
  Filled 2020-11-29: qty 100

## 2020-11-29 MED ORDER — PANTOPRAZOLE SODIUM 40 MG PO TBEC
40.0000 mg | DELAYED_RELEASE_TABLET | Freq: Every day | ORAL | Status: DC
  Administered 2020-11-30 – 2020-12-02 (×3): 40 mg via ORAL
  Filled 2020-11-29 (×3): qty 1

## 2020-11-29 MED ORDER — BUPIVACAINE-EPINEPHRINE 0.5% -1:200000 IJ SOLN
INTRAMUSCULAR | Status: AC
  Filled 2020-11-29: qty 1

## 2020-11-29 MED ORDER — SODIUM CHLORIDE 3 % IN NEBU
INHALATION_SOLUTION | RESPIRATORY_TRACT | Status: AC
  Filled 2020-11-29: qty 4

## 2020-11-29 MED ORDER — EPHEDRINE 5 MG/ML INJ
INTRAVENOUS | Status: AC
  Filled 2020-11-29: qty 5

## 2020-11-29 MED ORDER — DOCUSATE SODIUM 100 MG PO CAPS
100.0000 mg | ORAL_CAPSULE | Freq: Two times a day (BID) | ORAL | Status: DC
  Administered 2020-11-30 – 2020-12-02 (×4): 100 mg via ORAL
  Filled 2020-11-29 (×5): qty 1

## 2020-11-29 MED ORDER — ZOLPIDEM TARTRATE 5 MG PO TABS: 5.0000 mg | ORAL_TABLET | Freq: Every evening | ORAL | Status: DC | PRN

## 2020-11-29 MED ORDER — POVIDONE-IODINE 10 % EX SWAB
2.0000 "application " | Freq: Once | CUTANEOUS | Status: AC
  Administered 2020-11-29: 2 via TOPICAL

## 2020-11-29 MED ORDER — LACTATED RINGERS IV SOLN: INTRAVENOUS | Status: DC | PRN

## 2020-11-29 MED ORDER — METOCLOPRAMIDE HCL 5 MG/ML IJ SOLN: 5.0000 mg | Freq: Three times a day (TID) | INTRAMUSCULAR | Status: DC | PRN

## 2020-11-29 MED ORDER — SODIUM CHLORIDE 0.9 % IR SOLN
Status: DC | PRN
  Administered 2020-11-29: 1000 mL
  Administered 2020-11-29: 3000 mL

## 2020-11-29 MED ORDER — ONDANSETRON HCL 4 MG/2ML IJ SOLN: 4.0000 mg | Freq: Four times a day (QID) | INTRAMUSCULAR | Status: DC | PRN

## 2020-11-29 MED ORDER — ONDANSETRON HCL 4 MG PO TABS: 4.0000 mg | ORAL_TABLET | Freq: Four times a day (QID) | ORAL | Status: DC | PRN

## 2020-11-29 MED ORDER — BUPIVACAINE-EPINEPHRINE (PF) 0.25% -1:200000 IJ SOLN
INTRAMUSCULAR | Status: AC
  Filled 2020-11-29: qty 30

## 2020-11-29 MED ORDER — ACETAMINOPHEN 325 MG PO TABS
325.0000 mg | ORAL_TABLET | Freq: Four times a day (QID) | ORAL | Status: DC | PRN
  Administered 2020-11-30: 650 mg via ORAL
  Filled 2020-11-29: qty 2

## 2020-11-29 MED ORDER — STERILE WATER FOR IRRIGATION IR SOLN
Status: DC | PRN
  Administered 2020-11-29: 2000 mL

## 2020-11-29 MED ORDER — DEXMEDETOMIDINE (PRECEDEX) IN NS 20 MCG/5ML (4 MCG/ML) IV SYRINGE
PREFILLED_SYRINGE | INTRAVENOUS | Status: AC
  Filled 2020-11-29: qty 5

## 2020-11-29 MED ORDER — HYDROCODONE-ACETAMINOPHEN 5-325 MG PO TABS: 1.0000 | ORAL_TABLET | ORAL | Status: DC | PRN

## 2020-11-29 MED ORDER — ROCURONIUM BROMIDE 100 MG/10ML IV SOLN
INTRAVENOUS | Status: DC | PRN
  Administered 2020-11-29: 40 mg via INTRAVENOUS

## 2020-11-29 MED ORDER — CALCIUM 600-200 MG-UNIT PO TABS: 1.0000 | ORAL_TABLET | Freq: Every day | ORAL | Status: DC

## 2020-11-29 MED ORDER — EPHEDRINE SULFATE 50 MG/ML IJ SOLN
INTRAMUSCULAR | Status: DC | PRN
  Administered 2020-11-29: 10 mg via INTRAVENOUS

## 2020-11-29 MED ORDER — HYDROCODONE-ACETAMINOPHEN 7.5-325 MG PO TABS: 1.0000 | ORAL_TABLET | ORAL | Status: DC | PRN

## 2020-11-29 MED ORDER — KETOROLAC TROMETHAMINE 30 MG/ML IJ SOLN
INTRAMUSCULAR | Status: DC | PRN
  Administered 2020-11-29: 30 mg

## 2020-11-29 MED ORDER — ALBUTEROL SULFATE (2.5 MG/3ML) 0.083% IN NEBU
2.5000 mg | INHALATION_SOLUTION | Freq: Once | RESPIRATORY_TRACT | Status: AC
  Administered 2020-11-29: 2.5 mg via RESPIRATORY_TRACT

## 2020-11-29 MED ORDER — SODIUM CHLORIDE (PF) 0.9 % IJ SOLN
INTRAMUSCULAR | Status: DC | PRN
  Administered 2020-11-29: 30 mL

## 2020-11-29 MED ORDER — BUPIVACAINE-EPINEPHRINE (PF) 0.25% -1:200000 IJ SOLN
INTRAMUSCULAR | Status: DC | PRN
  Administered 2020-11-29: 30 mL

## 2020-11-29 MED ORDER — PHENYLEPHRINE HCL-NACL 20-0.9 MG/250ML-% IV SOLN
INTRAVENOUS | Status: DC | PRN
  Administered 2020-11-29: 50 ug/min via INTRAVENOUS

## 2020-11-29 MED ORDER — METHOCARBAMOL 500 MG PO TABS
500.0000 mg | ORAL_TABLET | Freq: Four times a day (QID) | ORAL | Status: DC | PRN
  Administered 2020-11-30: 500 mg via ORAL
  Filled 2020-11-29: qty 1

## 2020-11-29 MED ORDER — ALBUTEROL SULFATE (2.5 MG/3ML) 0.083% IN NEBU
INHALATION_SOLUTION | RESPIRATORY_TRACT | Status: AC
  Filled 2020-11-29: qty 3

## 2020-11-29 MED ORDER — FENTANYL CITRATE PF 50 MCG/ML IJ SOSY: 25.0000 ug | PREFILLED_SYRINGE | INTRAMUSCULAR | Status: DC | PRN

## 2020-11-29 MED ORDER — METOCLOPRAMIDE HCL 5 MG PO TABS
5.0000 mg | ORAL_TABLET | Freq: Two times a day (BID) | ORAL | Status: DC
  Administered 2020-11-30 – 2020-12-02 (×5): 5 mg via ORAL
  Filled 2020-11-29 (×5): qty 1

## 2020-11-29 MED ORDER — CALCIUM CARBONATE-VITAMIN D 500-200 MG-UNIT PO TABS
1.0000 | ORAL_TABLET | Freq: Every day | ORAL | Status: DC
  Administered 2020-11-30 – 2020-12-02 (×3): 1 via ORAL
  Filled 2020-11-29 (×3): qty 1

## 2020-11-29 MED ORDER — METHOCARBAMOL 1000 MG/10ML IJ SOLN
500.0000 mg | Freq: Four times a day (QID) | INTRAVENOUS | Status: DC | PRN
  Filled 2020-11-29: qty 5

## 2020-11-29 MED ORDER — CEFAZOLIN SODIUM-DEXTROSE 2-4 GM/100ML-% IV SOLN
2.0000 g | INTRAVENOUS | Status: AC
  Administered 2020-11-29: 2 g via INTRAVENOUS
  Filled 2020-11-29: qty 100

## 2020-11-29 MED ORDER — CHLORHEXIDINE GLUCONATE 0.12 % MT SOLN
5.0000 mL | Freq: Every day | OROMUCOSAL | Status: DC
  Administered 2020-12-01 – 2020-12-02 (×2): 5 mL via OROMUCOSAL
  Filled 2020-11-29 (×3): qty 15

## 2020-11-29 MED ORDER — SODIUM CHLORIDE 0.9 % IV SOLN: INTRAVENOUS | Status: DC

## 2020-11-29 MED ORDER — FENTANYL CITRATE (PF) 100 MCG/2ML IJ SOLN
INTRAMUSCULAR | Status: DC | PRN
  Administered 2020-11-29 (×4): 25 ug via INTRAVENOUS

## 2020-11-29 MED ORDER — MENTHOL 3 MG MT LOZG: 1.0000 | LOZENGE | OROMUCOSAL | Status: DC | PRN

## 2020-11-29 MED ORDER — FENTANYL CITRATE (PF) 100 MCG/2ML IJ SOLN
INTRAMUSCULAR | Status: AC
  Filled 2020-11-29: qty 2

## 2020-11-29 MED ORDER — SENNA 8.6 MG PO TABS
1.0000 | ORAL_TABLET | Freq: Two times a day (BID) | ORAL | Status: DC
  Administered 2020-11-30 – 2020-12-02 (×5): 8.6 mg via ORAL
  Filled 2020-11-29 (×5): qty 1

## 2020-11-29 MED ORDER — DEXAMETHASONE SODIUM PHOSPHATE 10 MG/ML IJ SOLN
INTRAMUSCULAR | Status: DC | PRN
  Administered 2020-11-29: 4 mg via INTRAVENOUS

## 2020-11-29 MED ORDER — TRANEXAMIC ACID-NACL 1000-0.7 MG/100ML-% IV SOLN
1000.0000 mg | Freq: Once | INTRAVENOUS | Status: AC
  Administered 2020-11-29: 1000 mg via INTRAVENOUS
  Filled 2020-11-29: qty 100

## 2020-11-29 MED ORDER — SUGAMMADEX SODIUM 200 MG/2ML IV SOLN
INTRAVENOUS | Status: DC | PRN
  Administered 2020-11-29: 150 mg via INTRAVENOUS

## 2020-11-29 MED ORDER — MUPIROCIN 2 % EX OINT
1.0000 "application " | TOPICAL_OINTMENT | Freq: Two times a day (BID) | CUTANEOUS | Status: DC
  Administered 2020-11-29 (×2): 1 via NASAL
  Filled 2020-11-29: qty 22

## 2020-11-29 MED ORDER — CHLORHEXIDINE GLUCONATE CLOTH 2 % EX PADS
6.0000 | MEDICATED_PAD | Freq: Every day | CUTANEOUS | Status: DC
  Administered 2020-11-29: 6 via TOPICAL

## 2020-11-29 MED ORDER — PROPOFOL 10 MG/ML IV BOLUS
INTRAVENOUS | Status: DC | PRN
  Administered 2020-11-29: 20 mg via INTRAVENOUS
  Administered 2020-11-29: 60 mg via INTRAVENOUS
  Administered 2020-11-29: 20 mg via INTRAVENOUS

## 2020-11-29 MED ORDER — DROPERIDOL 2.5 MG/ML IJ SOLN
0.6250 mg | Freq: Once | INTRAMUSCULAR | Status: DC | PRN
Start: 2020-11-29 — End: 2020-11-29

## 2020-11-29 MED ORDER — ASPIRIN EC 325 MG PO TBEC
325.0000 mg | DELAYED_RELEASE_TABLET | Freq: Every day | ORAL | Status: DC
  Administered 2020-11-30 – 2020-12-02 (×3): 325 mg via ORAL
  Filled 2020-11-29 (×3): qty 1

## 2020-11-29 MED ORDER — ROCURONIUM BROMIDE 10 MG/ML (PF) SYRINGE
PREFILLED_SYRINGE | INTRAVENOUS | Status: AC
  Filled 2020-11-29: qty 10

## 2020-11-29 MED ORDER — 0.9 % SODIUM CHLORIDE (POUR BTL) OPTIME
TOPICAL | Status: DC | PRN
  Administered 2020-11-29: 1000 mL

## 2020-11-29 MED ORDER — LIDOCAINE 2% (20 MG/ML) 5 ML SYRINGE
INTRAMUSCULAR | Status: DC | PRN
  Administered 2020-11-29: 50 mg via INTRAVENOUS

## 2020-11-29 MED ORDER — LORATADINE 10 MG PO TABS
10.0000 mg | ORAL_TABLET | Freq: Every day | ORAL | Status: DC
  Administered 2020-11-30 – 2020-12-01 (×2): 10 mg via ORAL
  Filled 2020-11-29 (×2): qty 1

## 2020-11-29 MED ORDER — METOCLOPRAMIDE HCL 5 MG PO TABS: 5.0000 mg | ORAL_TABLET | Freq: Three times a day (TID) | ORAL | Status: DC | PRN

## 2020-11-29 MED ORDER — PHENOL 1.4 % MT LIQD: 1.0000 | OROMUCOSAL | Status: DC | PRN

## 2020-11-29 MED ORDER — POVIDONE-IODINE 10 % EX SWAB: 2.0000 "application " | Freq: Once | CUTANEOUS | Status: DC

## 2020-11-29 MED ORDER — LACTATED RINGERS IV SOLN: INTRAVENOUS | Status: DC

## 2020-11-29 MED ORDER — CEFAZOLIN SODIUM-DEXTROSE 2-4 GM/100ML-% IV SOLN
2.0000 g | Freq: Four times a day (QID) | INTRAVENOUS | Status: AC
Start: 2020-11-29 — End: 2020-11-30
  Administered 2020-11-29 – 2020-11-30 (×2): 2 g via INTRAVENOUS
  Filled 2020-11-29 (×2): qty 100

## 2020-11-29 SURGICAL SUPPLY — 58 items
ADH SKN CLS APL DERMABOND .7 (GAUZE/BANDAGES/DRESSINGS) ×1
APL PRP STRL LF DISP 70% ISPRP (MISCELLANEOUS) ×1
BAG COUNTER SPONGE SURGICOUNT (BAG) ×2 IMPLANT
BAG SPNG CNTER NS LX DISP (BAG) ×1
BLADE CLIPPER SURG (BLADE) IMPLANT
CHLORAPREP W/TINT 26 (MISCELLANEOUS) ×2 IMPLANT
COVER SURGICAL LIGHT HANDLE (MISCELLANEOUS) ×2 IMPLANT
DERMABOND ADVANCED (GAUZE/BANDAGES/DRESSINGS) ×1
DERMABOND ADVANCED .7 DNX12 (GAUZE/BANDAGES/DRESSINGS) ×2 IMPLANT
DRAPE IMP U-DRAPE 54X76 (DRAPES) ×2 IMPLANT
DRAPE SHEET LG 3/4 BI-LAMINATE (DRAPES) ×4 IMPLANT
DRAPE STERI IOBAN 125X83 (DRAPES) ×2 IMPLANT
DRAPE U-SHAPE 47X51 STRL (DRAPES) ×4 IMPLANT
DRESSING AQUACEL AG SP 3.5X10 (GAUZE/BANDAGES/DRESSINGS) IMPLANT
DRSG AQUACEL AG ADV 3.5X10 (GAUZE/BANDAGES/DRESSINGS) ×2 IMPLANT
DRSG AQUACEL AG SP 3.5X10 (GAUZE/BANDAGES/DRESSINGS) ×2
ELECT REM PT RETURN 15FT ADLT (MISCELLANEOUS) ×2 IMPLANT
EVACUATOR 1/8 PVC DRAIN (DRAIN) IMPLANT
GLOVE SRG 8 PF TXTR STRL LF DI (GLOVE) ×1 IMPLANT
GLOVE SURG ENC MOIS LTX SZ8.5 (GLOVE) ×4 IMPLANT
GLOVE SURG ENC TEXT LTX SZ7.5 (GLOVE) ×4 IMPLANT
GLOVE SURG UNDER POLY LF SZ8 (GLOVE) ×2
GLOVE SURG UNDER POLY LF SZ8.5 (GLOVE) ×2 IMPLANT
GOWN STRL REUS W/ TWL LRG LVL3 (GOWN DISPOSABLE) ×1 IMPLANT
GOWN STRL REUS W/TWL 2XL LVL3 (GOWN DISPOSABLE) ×2 IMPLANT
GOWN STRL REUS W/TWL LRG LVL3 (GOWN DISPOSABLE) ×2
HANDPIECE INTERPULSE COAX TIP (DISPOSABLE) ×2
HEAD FEM UNIPOLAR 43 OD STRL (Hips) ×1 IMPLANT
HOOD PEEL AWAY FLYTE STAYCOOL (MISCELLANEOUS) ×6 IMPLANT
JET LAVAGE IRRISEPT WOUND (IRRIGATION / IRRIGATOR)
KIT TURNOVER KIT A (KITS) ×2 IMPLANT
LAVAGE JET IRRISEPT WOUND (IRRIGATION / IRRIGATOR) IMPLANT
MANIFOLD NEPTUNE II (INSTRUMENTS) ×2 IMPLANT
MARKER SKIN DUAL TIP RULER LAB (MISCELLANEOUS) ×2 IMPLANT
NDL SPNL 18GX3.5 QUINCKE PK (NEEDLE) ×1 IMPLANT
NEEDLE SPNL 18GX3.5 QUINCKE PK (NEEDLE) ×2 IMPLANT
NS IRRIG 1000ML POUR BTL (IV SOLUTION) ×2 IMPLANT
PACK ANTERIOR HIP CUSTOM (KITS) ×2 IMPLANT
PENCIL SMOKE EVACUATOR (MISCELLANEOUS) IMPLANT
SAW OSC TIP CART 19.5X105X1.3 (SAW) ×2 IMPLANT
SEALER BIPOLAR AQUA 6.0 (INSTRUMENTS) ×2 IMPLANT
SET HNDPC FAN SPRY TIP SCT (DISPOSABLE) ×1 IMPLANT
SPACER DEPUY (Hips) ×1 IMPLANT
STEM TRI LOC BPS SZ3 W GRIPTON IMPLANT
SUT ETHIBOND NAB CT1 #1 30IN (SUTURE) ×4 IMPLANT
SUT MNCRL AB 3-0 PS2 18 (SUTURE) ×2 IMPLANT
SUT MON AB 2-0 CT1 36 (SUTURE) ×2 IMPLANT
SUT STRATAFIX PDO 1 14 VIOLET (SUTURE) ×2
SUT STRATFX PDO 1 14 VIOLET (SUTURE) ×1
SUT VIC AB 1 CT1 27 (SUTURE) ×2
SUT VIC AB 1 CT1 27XBRD ANTBC (SUTURE) ×1 IMPLANT
SUT VIC AB 2-0 CT1 27 (SUTURE) ×2
SUT VIC AB 2-0 CT1 TAPERPNT 27 (SUTURE) ×1 IMPLANT
SUTURE STRATFX PDO 1 14 VIOLET (SUTURE) ×1 IMPLANT
TRAY FOLEY MTR SLVR 16FR STAT (SET/KITS/TRAYS/PACK) IMPLANT
TRI LOC BPS SZ 3 W GRIPTON ×2 IMPLANT
TUBE SUCTION HIGH CAP CLEAR NV (SUCTIONS) ×2 IMPLANT
WATER STERILE IRR 1000ML POUR (IV SOLUTION) ×4 IMPLANT

## 2020-11-29 NOTE — Anesthesia Procedure Notes (Signed)
Procedure Name: Intubation Date/Time: 11/29/2020 12:30 PM Performed by: Lavina Hamman, CRNA Pre-anesthesia Checklist: Patient identified, Emergency Drugs available, Suction available, Patient being monitored and Timeout performed Patient Re-evaluated:Patient Re-evaluated prior to induction Oxygen Delivery Method: Circle system utilized Preoxygenation: Pre-oxygenation with 100% oxygen Induction Type: IV induction Ventilation: Mask ventilation without difficulty Laryngoscope Size: 3 and Glidescope Grade View: Grade I Tube type: Oral Tube size: 6.5 mm Number of attempts: 1 Airway Equipment and Method: Rigid stylet Placement Confirmation: ETT inserted through vocal cords under direct vision, positive ETCO2, CO2 detector and breath sounds checked- equal and bilateral Secured at: 20 cm Tube secured with: Tape Dental Injury: Teeth and Oropharynx as per pre-operative assessment  Difficulty Due To: Difficulty was anticipated, Difficult Airway- due to limited oral opening, Difficult Airway- due to dentition and Difficult Airway- due to reduced neck mobility Comments: Upper lip bloody prior to intubation, airway has thick yellow secretions dried above vocal cords, suctioned, easy pass of ettube.

## 2020-11-29 NOTE — Interval H&P Note (Signed)
History and Physical Interval Note:  11/29/2020 10:55 AM  Grace Gilbert  has presented today for surgery, with the diagnosis of RIGHT FEMORAL HIP FX.Marland Kitchen  The various methods of treatment have been discussed with the patient and family. After consideration of risks, benefits and other options for treatment, the patient has consented to  Procedure(s): ANTERIOR APPROACH HEMI HIP ARTHROPLASTY (Right) as a surgical intervention.  The patient's history has been reviewed, patient examined, no change in status, stable for surgery.  I have reviewed the patient's chart and labs.  Questions were answered to the patient's satisfaction.    Discussed with patient's POA, Benjamine Mola, due to severe dementia.   Hilton Cork Artha Stavros

## 2020-11-29 NOTE — Progress Notes (Signed)
Spoke with patients legal guardian Benjamine Mola to get consent for surgery. Benjamine Mola said she has not spoken with MD yet. Unable to get consent at this time will call back once MD speaks with legal guardian.

## 2020-11-29 NOTE — Discharge Instructions (Signed)
? ?Dr. Kashton Mcartor ?Joint Replacement Specialist ?[facility] ?[address] ?[place], [place] [address] ?[phone] ? ? ?TOTAL HIP REPLACEMENT POSTOPERATIVE DIRECTIONS ? ? ? ?Hip Rehabilitation, Guidelines Following Surgery  ? ?WEIGHT BEARING ?Weight bearing as tolerated with assist device (walker, cane, etc) as directed, use it as long as suggested by your surgeon or therapist, typically at least 4-6 weeks. ? ?The results of a hip operation are greatly improved after range of motion and muscle strengthening exercises. Follow all safety measures which are given to protect your hip. If any of these exercises cause increased pain or swelling in your joint, decrease the amount until you are comfortable again. Then slowly increase the exercises. Call your caregiver if you have problems or questions.  ? ?HOME CARE INSTRUCTIONS  ?Most of the following instructions are designed to prevent the dislocation of your new hip.  ?Remove items at home which could result in a fall. This includes throw rugs or furniture in walking pathways.  ?Continue medications as instructed at time of discharge. ?You may have some home medications which will be placed on hold until you complete the course of blood thinner medication. ?You may start showering once you are discharged home. Do not remove your dressing. ?Do not put on socks or shoes without following the instructions of your caregivers.   ?Sit on chairs with arms. Use the chair arms to help push yourself up when arising.  ?Arrange for the use of a toilet seat elevator so you are not sitting low.  ?Walk with walker as instructed.  ?You may resume a sexual relationship in one month or when given the OK by your caregiver.  ?Use walker as long as suggested by your caregivers.  ?You may put full weight on your legs and walk as much as is comfortable. ?Avoid periods of inactivity such as sitting longer than an hour when not asleep. This helps prevent blood  clots.  ?You may return to work once you are cleared by your surgeon.  ?Do not drive a car for 6 weeks or until released by your surgeon.  ?Do not drive while taking narcotics.  ?Wear elastic stockings for two weeks following surgery during the day but you may remove then at night.  ?Make sure you keep all of your appointments after your operation with all of your doctors and caregivers. You should call the office at the above phone number and make an appointment for approximately two weeks after the date of your surgery. ?Please pick up a stool softener and laxative for home use as long as you are requiring pain medications. ?ICE to the affected hip every three hours for 30 minutes at a time and then as needed for pain and swelling. Continue to use ice on the hip for pain and swelling from surgery. You may notice swelling that will progress down to the foot and ankle.  This is normal after surgery.  Elevate the leg when you are not up walking on it.   ?It is important for you to complete the blood thinner medication as prescribed by your doctor. ?Continue to use the breathing machine which will help keep your temperature down.  It is common for your temperature to cycle up and down following surgery, especially at night when you are not up moving around and exerting yourself.  The breathing machine keeps your lungs expanded and your temperature down. ? ?RANGE OF MOTION AND STRENGTHENING EXERCISES  ?These exercises are designed to help you   keep full movement of your hip joint. Follow your caregiver's or physical therapist's instructions. Perform all exercises about fifteen times, three times per day or as directed. Exercise both hips, even if you have had only one joint replacement. These exercises can be done on a training (exercise) mat, on the floor, on a table or on a bed. Use whatever works the best and is most comfortable for you. Use music or television while you are exercising so that the exercises are a  pleasant break in your day. This will make your life better with the exercises acting as a break in routine you can look forward to.  ?Lying on your back, slowly slide your foot toward your buttocks, raising your knee up off the floor. Then slowly slide your foot back down until your leg is straight again.  ?Lying on your back spread your legs as far apart as you can without causing discomfort.  ?Lying on your side, raise your upper leg and foot straight up from the floor as far as is comfortable. Slowly lower the leg and repeat.  ?Lying on your back, tighten up the muscle in the front of your thigh (quadriceps muscles). You can do this by keeping your leg straight and trying to raise your heel off the floor. This helps strengthen the largest muscle supporting your knee.  ?Lying on your back, tighten up the muscles of your buttocks both with the legs straight and with the knee bent at a comfortable angle while keeping your heel on the floor.  ? ?SKILLED REHAB INSTRUCTIONS: ?If the patient is transferred to a skilled rehab facility following release from the hospital, a list of the current medications will be sent to the facility for the patient to continue.  When discharged from the skilled rehab facility, please have the facility set up the patient's Home Health Physical Therapy prior to being released. Also, the skilled facility will be responsible for providing the patient with their medications at time of release from the facility to include their pain medication and their blood thinner medication. If the patient is still at the rehab facility at time of the two week follow up appointment, the skilled rehab facility will also need to assist the patient in arranging follow up appointment in our office and any transportation needs. ? ?POST-OPERATIVE OPIOID TAPER INSTRUCTIONS: ?It is important to wean off of your opioid medication as soon as possible. If you do not need pain medication after your surgery it is ok  to stop day one. ?Opioids include: ?Codeine, Hydrocodone(Norco, Vicodin), Oxycodone(Percocet, oxycontin) and hydromorphone amongst others.  ?Long term and even short term use of opiods can cause: ?Increased pain response ?Dependence ?Constipation ?Depression ?Respiratory depression ?And more.  ?Withdrawal symptoms can include ?Flu like symptoms ?Nausea, vomiting ?And more ?Techniques to manage these symptoms ?Hydrate well ?Eat regular healthy meals ?Stay active ?Use relaxation techniques(deep breathing, meditating, yoga) ?Do Not substitute Alcohol to help with tapering ?If you have been on opioids for less than two weeks and do not have pain than it is ok to stop all together.  ?Plan to wean off of opioids ?This plan should start within one week post op of your joint replacement. ?Maintain the same interval or time between taking each dose and first decrease the dose.  ?Cut the total daily intake of opioids by one tablet each day ?Next start to increase the time between doses. ?The last dose that should be eliminated is the evening dose.  ? ? ?MAKE   SURE YOU:  ?Understand these instructions.  ?Will watch your condition.  ?Will get help right away if you are not doing well or get worse. ? ?Pick up stool softner and laxative for home use following surgery while on pain medications. ?Do not remove your dressing. ?The dressing is waterproof--it is OK to take showers. ?Continue to use ice for pain and swelling after surgery. ?Do not use any lotions or creams on the incision until instructed by your surgeon. ?Total Hip Protocol. ? ?

## 2020-11-29 NOTE — Progress Notes (Signed)
PROGRESS NOTE    Grace Gilbert  [id number] DOB: 1932-04-18 DOA: 11/27/2020 PCP: [name], MD    Brief Narrative:  87 year old female with history of hypertension and hyperlipidemia, advanced dementia and nonverbal presented by EMS with fall at a skilled nursing facility.  In the emergency room, hemodynamically stable.  CBC 15,000.  X-ray with right femoral neck fracture.   Assessment & Plan:   Principal Problem:   Closed displaced fracture of right femoral neck (HCC) Active Problems:   Hypertension   Alzheimer's dementia ([place])   Normocytic anemia  Close traumatic fracture of the right femoral neck: Status post right hip hemiarthroplasty Adequate pain medications.  Weightbearing as tolerated.  PT OT to start 9/1. Anticipate discharge back to nursing home. Continue IV fluid hydration until oral intake.  Essential hypertension: Blood pressure stable.  Alzheimer's dementia: Long-term nursing home resident.  Stable.  Continue supportive care.  Leukocytosis: Probably reactive.  No evidence of infection.  Will monitor.  Check urinalysis.    DVT prophylaxis: SCDs Start: 11/29/20 1736   Code Status: DNR Family Communication: None.  Surgery communicating with legal guardian. Disposition Plan: Status is: Inpatient  Remains inpatient appropriate because:Inpatient level of care appropriate due to severity of illness  Dispo: The patient is from: SNF              Anticipated d/c is to: SNF              Patient currently is not medically stable to d/c.   Difficult to place patient No         Consultants:  Orthopedics  Procedures:  Right hip hemiarthroplasty 8/31  Antimicrobials:  None   Subjective: Patient seen and examined.  She is nonverbal.  She was just making some noise when I try to examine her.  Not able to explain most symptoms.  Objective: Vitals:   11/29/20 1630 11/29/20 1645 11/29/20 1700 11/29/20 1731  BP: (!) 146/71 (!) 149/72 129/62 (!)  135/113  Pulse: 87 96 98 93  Resp: '20 14 18 18  '$ Temp:   98.4 F (36.9 C) 99.4 F (37.4 C)  TempSrc:    Oral  SpO2: 92% 95% 92% 96%  Weight:      Height:        Intake/Output Summary (Last 24 hours) at 11/29/2020 1758 Last data filed at 11/29/2020 1715 Gross per 24 hour  Intake 900 ml  Output 50 ml  Net 850 ml   Filed Weights   11/28/20 2026  Weight: 62.2 kg    Examination:  General: Chronically sick looking.  Not in any distress.  On room air. Cardiovascular: S1-S2 normal.  Regular rate rhythm. Respiratory: Bilateral clear. Gastrointestinal: Soft and nontender. Ext: Right hip with immediate postop dressing.  Distal neurovascular status intact. Neuro: Alert and awake.  Not oriented.  Speaks few words but incomprehensible. Does not follow any commands.   Data Reviewed: I have personally reviewed following labs and imaging studies  CBC: Recent Labs  Lab 11/27/20 2340 11/28/20 0520 11/29/20 0435  WBC 15.0* 14.0* 20.6*  NEUTROABS 11.1*  --  16.5*  HGB 11.8* 11.2* 12.2  HCT 36.1 34.6* 38.0  MCV 92.6 93.5 94.5  PLT 223 205 [id number]   Basic Metabolic Panel: Recent Labs  Lab 11/27/20 2340 11/28/20 0520 11/29/20 0435  NA 141 141 143  K 3.9 4.1 3.7  CL 106 107 103  CO2 '27 29 30  '$ GLUCOSE 119* 122* 144*  BUN 12 12 22  CREATININE 0.58 0.48 0.66  CALCIUM 9.0 8.8* 9.1  MG  --   --  2.1   GFR: Estimated Creatinine Clearance: 42.4 mL/min (by C-G formula based on SCr of 0.66 mg/dL). Liver Function Tests: No results for input(s): AST, ALT, ALKPHOS, BILITOT, PROT, ALBUMIN in the last 168 hours. No results for input(s): LIPASE, AMYLASE in the last 168 hours. No results for input(s): AMMONIA in the last 168 hours. Coagulation Profile: Recent Labs  Lab 11/27/20 2340  INR 1.0   Cardiac Enzymes: No results for input(s): CKTOTAL, CKMB, CKMBINDEX, TROPONINI in the last 168 hours. BNP (last 3 results) No results for input(s): PROBNP in the last 8760 hours. HbA1C: No  results for input(s): HGBA1C in the last 72 hours. CBG: Recent Labs  Lab 11/28/20 0608 11/29/20 1650  GLUCAP 122* 191*   Lipid Profile: No results for input(s): CHOL, HDL, LDLCALC, TRIG, CHOLHDL, LDLDIRECT in the last 72 hours. Thyroid Function Tests: No results for input(s): TSH, T4TOTAL, FREET4, T3FREE, THYROIDAB in the last 72 hours. Anemia Panel: No results for input(s): VITAMINB12, FOLATE, FERRITIN, TIBC, IRON, RETICCTPCT in the last 72 hours. Sepsis Labs: No results for input(s): PROCALCITON, LATICACIDVEN in the last 168 hours.  Recent Results (from the past 240 hour(s))  SARS CORONAVIRUS 2 (TAT 6-24 HRS) Nasopharyngeal Nasopharyngeal Swab     Status: None   Collection Time: 11/27/20 11:45 PM   Specimen: Nasopharyngeal Swab  Result Value Ref Range Status   SARS Coronavirus 2 NEGATIVE NEGATIVE Final    Comment: (NOTE) SARS-CoV-2 target nucleic acids are NOT DETECTED.  The SARS-CoV-2 RNA is generally detectable in upper and lower respiratory specimens during the acute phase of infection. Negative results do not preclude SARS-CoV-2 infection, do not rule out co-infections with other pathogens, and should not be used as the sole basis for treatment or other patient management decisions. Negative results must be combined with clinical observations, patient history, and epidemiological information. The expected result is Negative.  Fact Sheet for Patients: SugarRoll.be  Fact Sheet for Healthcare Providers: https://www.woods-mathews.com/  This test is not yet approved or cleared by the [place] FDA and  has been authorized for detection and/or diagnosis of SARS-CoV-2 by FDA under an Emergency Use Authorization (EUA). This EUA will remain  in effect (meaning this test can be used) for the duration of the COVID-19 declaration under Se ction 564(b)(1) of the Act, 21 U.S.C. section 360bbb-3(b)(1), unless the authorization is  terminated or revoked sooner.  Performed at [facility], [place] [address], [place], [place] [address]   Surgical pcr screen     Status: Abnormal   Collection Time: 11/28/20 10:49 PM   Specimen: Nasal Mucosa; Nasal Swab  Result Value Ref Range Status   MRSA, PCR NEGATIVE NEGATIVE Final   Staphylococcus aureus POSITIVE (A) NEGATIVE Final    Comment: (NOTE) The Xpert SA Assay (FDA approved for NASAL specimens in patients 57 years of age and older), is one component of a comprehensive surveillance program. It is not intended to diagnose infection nor to guide or monitor treatment. Performed at [facility], [place] [address], [place], [place] [address]   Surgical pcr screen     Status: Abnormal   Collection Time: 11/29/20  7:57 AM   Specimen: Nasal Mucosa; Nasal Swab  Result Value Ref Range Status   MRSA, PCR NEGATIVE NEGATIVE Final   Staphylococcus aureus POSITIVE (A) NEGATIVE Final    Comment: (NOTE) The Xpert SA Assay (FDA approved for NASAL specimens  in patients 38 years of age and older), is one component of a comprehensive surveillance program. It is not intended to diagnose infection nor to guide or monitor treatment. Performed at [facility], [place] [address], [place], [place] [address]          Radiology Studies: DG Chest 1 View  Result Date: 11/28/2020 CLINICAL DATA:  Status post fall. EXAM: CHEST  1 VIEW COMPARISON:  September 25, 2018 FINDINGS: Low lung volumes are seen with mild, diffuse, chronic appearing increased lung markings. Mild prominence of the pulmonary vasculature is noted. There is no evidence of an acute infiltrate, pleural effusion or pneumothorax. The heart size and mediastinal contours are within normal limits. There is moderate severity calcification of the aortic arch. Degenerative changes are seen throughout the thoracic spine. IMPRESSION: Chronic appearing increased lung markings with findings suggestive of mild  pulmonary vascular congestion. Electronically Signed   By: Virgina Norfolk M.D.   On: 11/28/2020 00:27   Pelvis Portable  Result Date: 11/29/2020 CLINICAL DATA:  Right hip replacement anterior approach EXAM: PORTABLE PELVIS 1-2 VIEWS COMPARISON:  11/28/2020 FINDINGS: Right hip replacement in satisfactory position alignment. Hemiarthroplasty prosthesis. No fracture or complication. IMPRESSION: Satisfactory right hip replacement Electronically Signed   By: Franchot Gallo M.D.   On: 11/29/2020 17:04   DG Knee Right Port  Result Date: 11/28/2020 CLINICAL DATA:  Fall. EXAM: PORTABLE RIGHT KNEE - 1-2 VIEW COMPARISON:  None. FINDINGS: Two-view exam is limited by positioning. Within this limitation there is no gross fracture or dislocation evident. Assessment for joint effusion is limited by positioning. IMPRESSION: Limited study without acute bony abnormality Electronically Signed   By: Misty Stanley M.D.   On: 11/28/2020 09:30   DG C-Arm 1-60 Min-No Report  Result Date: 11/29/2020 Fluoroscopy was utilized by the requesting physician.  No radiographic interpretation.   DG HIP OPERATIVE UNILAT W OR W/O PELVIS RIGHT  Result Date: 11/29/2020 CLINICAL DATA:  Hip arthroplasty EXAM: OPERATIVE RIGHT HIP (WITH PELVIS IF PERFORMED)  VIEWS TECHNIQUE: Fluoroscopic spot image(s) were submitted for interpretation post-operatively. COMPARISON:  None. FINDINGS: Intraoperative fluoroscopic views of the right hip demonstrate total arthroplasty. No obvious perihardware fracture or component malpositioning. IMPRESSION: Intraoperative fluoroscopic views of the right hip demonstrate total arthroplasty. No obvious perihardware fracture or component malpositioning. Electronically Signed   By: Eddie Candle M.D.   On: 11/29/2020 14:00   DG Hip Unilat With Pelvis 2-3 Views Right  Result Date: 11/28/2020 CLINICAL DATA:  Status post fall. EXAM: DG HIP (WITH OR WITHOUT PELVIS) 2-3V RIGHT COMPARISON:  None. FINDINGS: Acute  fracture deformity is seen extending through the neck of the proximal right femur. Approximately 1/2 shaft width inferior displacement of the distal fracture site is seen. There is no evidence of dislocation. Soft tissue structures are unremarkable. IMPRESSION: Acute fracture of the proximal right femur. Electronically Signed   By: Virgina Norfolk M.D.   On: 11/28/2020 00:26        Scheduled Meds:  albuterol       [START ON 11/30/2020] aspirin EC  325 mg Oral Q breakfast   [START ON 11/30/2020] calcium-vitamin D  1 tablet Oral Q breakfast   chlorhexidine  5 mL Mouth/Throat Daily   docusate sodium  100 mg Oral BID   loratadine  10 mg Oral QHS   memantine  28 mg Oral QHS   metoCLOPramide  5 mg Oral BID   pantoprazole  40 mg Oral Daily   senna  1 tablet Oral  BID   sodium chloride HYPERTONIC       Continuous Infusions:  sodium chloride      ceFAZolin (ANCEF) IV     methocarbamol (ROBAXIN) IV     tranexamic acid       LOS: 1 day    Time spent: 30 minutes    Barb Merino, MD [facility] Pager [phone]

## 2020-11-29 NOTE — Anesthesia Preprocedure Evaluation (Addendum)
Anesthesia Evaluation  Patient identified by MRN, date of birth, ID band Patient awake    Reviewed: Allergy & Precautions, NPO status , Patient's Chart, lab work & pertinent test results  Airway Mallampati: II  TM Distance: >3 FB Neck ROM: Full    Dental no notable dental hx.    Pulmonary asthma , pneumonia, unresolved, former smoker,  Left pleural effusion S/P Thoracentesis Left basilar/lingular pneumonia   Pulmonary exam normal breath sounds clear to auscultation       Cardiovascular hypertension, Pt. on medications Normal cardiovascular exam Rhythm:Regular Rate:Normal  EKG 09/24/2018 NSR, LAFB, early transition V leads, LVH   Neuro/Psych PSYCHIATRIC DISORDERS Depression Dementia TIA   GI/Hepatic Neg liver ROS, hiatal hernia, GERD  Medicated,Achalasia Distal esophageal stricture   Endo/Other  diabetes, Well Controlled, Type 2Hyperlipidemia  Renal/GU negative Renal ROS  negative genitourinary   Musculoskeletal  (+) Arthritis , Osteoarthritis,  Osteoporosis   Abdominal   Peds  Hematology  (+) anemia , Thrombocytosis   Anesthesia Other Findings   Reproductive/Obstetrics                           Anesthesia Physical  Anesthesia Plan  ASA: 4  Anesthesia Plan: General   Post-op Pain Management:    Induction: Intravenous  PONV Risk Score and Plan: 3 and Ondansetron, Treatment may vary due to age or medical condition and Dexamethasone  Airway Management Planned: Oral ETT and Video Laryngoscope Planned  Additional Equipment: None  Intra-op Plan:   Post-operative Plan: Extubation in OR  Informed Consent: I have reviewed the patients History and Physical, chart, labs and discussed the procedure including the risks, benefits and alternatives for the proposed anesthesia with the patient or authorized representative who has indicated his/her understanding and acceptance.   Patient has  DNR.  Discussed DNR with power of attorney and Suspend DNR.   Dental advisory given and Consent reviewed with POA  Plan Discussed with: CRNA  Anesthesia Plan Comments: (Discussed plan of care with POA. She asked if I planned on spinal anesthetic. I explained that it would likely not be of benefit to the patient, and could pose some additional risks over GETA. Ms. Neena Rhymes has a history of silent aspiration and with sedation for spinal she could be at risk for aspiration. POA expressed understanding and felt comfortable with planning for GETA.)   Anesthesia Quick Evaluation

## 2020-11-29 NOTE — Op Note (Signed)
OPERATIVE REPORT  SURGEON: Rod Can, MD   ASSISTANT: Cherlynn June, PA-C  PREOPERATIVE DIAGNOSIS: Displaced Right femoral neck fracture.   POSTOPERATIVE DIAGNOSIS: Displaced Right femoral neck fracture.   PROCEDURE: Right hip hemiarthroplasty, anterior approach.   IMPLANTS: DePuy Tri Lock stem, size 3, hi offset, with a -3 mm spacer and a 43 mm monopolar head ball.  ANESTHESIA:  General  ANTIBIOTICS: 2g ancef.  ESTIMATED BLOOD LOSS:-50 mL    DRAINS: None.  COMPLICATIONS: None   CONDITION: PACU - hemodynamically stable.   BRIEF CLINICAL NOTE: Grace Gilbert is a 87 y.o. female with a displaced Right femoral neck fracture. The patient was admitted to the hospitalist service and underwent perioperative risk stratification and medical optimization. The risks, benefits, and alternatives to hemiarthroplasty were explained, and the POA elected to proceed due to the patient's severe dementia.  PROCEDURE IN DETAIL: The patient was taken to the operating room and general anesthesia was induced on the hospital bed.  The patient was then positioned on the Hana table.  All bony prominences were well padded.  The hip was prepped and draped in the normal sterile surgical fashion.  A time-out was called verifying side and site of surgery. Antibiotics were given within 60 minutes of beginning the procedure.   Bikini incision was made, and te direct anterior approach to the hip was performed through the Hueter interval.  Lateral femoral circumflex vessels were treated with the Auqumantys. The anterior capsule was exposed and an inverted T capsulotomy was made.  Fracture hematoma was encountered and evacuated. The patient was found to have a comminuted Right subcapital femoral neck fracture.  I freshened the femoral neck cut with a saw.  I removed the femoral neck fragment.  A corkscrew was placed into the head and the head was removed.  This was passed to the back table and was measured. The  pubofemoral ligament was released subperiosteally to the lesser trochanter.   Acetabular exposure was achieved.  I examined the articular cartilage which was intact.  The labrum was intact. A 43 mm trial head was placed and found to have excellent fit.   I then gained femoral exposure taking care to protect the abductors and greater trochanter.  This was performed using standard external rotation, extension, and adduction.  The superior capsule was incised, taking care to stay lateral to the posterior border of the femoral neck. A cookie cutter was used to enter the femoral canal, and then the femoral canal finder was used to confirm location.  I then sequentially broached up to a size 3.  Calcar planer was used on the femoral neck remnant.  I paced a hi neck and a 36 + 1.5 head ball. The hip was reduced.  Leg lengths were checked fluoroscopically.  The hip was dislocated and trial components were removed.  I placed the real stem followed by the real spacer and head ball.  A single reduction maneuver was performed and the hip was reduced.  Fluoroscopy was used to confirm component position and leg lengths.  At 90 degrees of external rotation and extension, the hip was stable to an anterior directed force.   The wound was copiously irrigated with Irrisept solution and normal saline using pule lavage.  Marcaine solution was injected into the periarticular soft tissue.  The wound was closed in layers using #1 Vicryl and V-Loc for the fascia, 2-0 Vicryl for the subcutaneous fat, 2-0 Monocryl for the deep dermal layer, 3-0 running Monocryl subcuticular stitch and  glue for the skin.  Once the glue was fully dried, an Aquacell Ag dressing was applied.  The patient was then awakened from anesthesia and transported to the recovery room in stable condition.  Sponge, needle, and instrument counts were correct at the end of the case x2.  The patient tolerated the procedure well and there were no known  complications.  Please note that a surgical assistant was a medical necessity for this procedure to perform it in a safe and expeditious manner. Assistant was necessary to provide appropriate retraction of vital neurovascular structures, to prevent femoral fracture, and to allow for anatomic placement of the prosthesis.

## 2020-11-29 NOTE — Transfer of Care (Signed)
Immediate Anesthesia Transfer of Care Note  Patient: Grace Gilbert  Procedure(s) Performed: Procedure(s): ANTERIOR APPROACH HEMI HIP ARTHROPLASTY (Right)  Patient Location: PACU  Anesthesia Type:General  Level of Consciousness:  sedated, patient cooperative and responds to stimulation  Airway & Oxygen Therapy:Patient Spontanous Breathing and Patient connected to face mask oxgen  Post-op Assessment:  Report given to PACU RN and Post -op Vital signs reviewed and stable  Post vital signs:  Reviewed and stable  Last Vitals:  Vitals:   11/29/20 1412 11/29/20 1415  BP: (!) 147/59 (!) 137/57  Pulse: 79 76  Resp: 13 14  Temp: 37.1 C   SpO2: 123456 [id number]    Complications: No apparent anesthesia complications

## 2020-11-30 ENCOUNTER — Encounter (HOSPITAL_COMMUNITY): Payer: Self-pay | Admitting: Orthopedic Surgery

## 2020-11-30 LAB — CBC
HCT: 28.3 % — ABNORMAL LOW (ref 36.0–46.0)
Hemoglobin: 9.3 g/dL — ABNORMAL LOW (ref 12.0–15.0)
MCH: 30.7 pg (ref 26.0–34.0)
MCHC: 32.9 g/dL (ref 30.0–36.0)
MCV: 93.4 fL (ref 80.0–100.0)
Platelets: 198 10*3/uL (ref 150–400)
RBC: 3.03 MIL/uL — ABNORMAL LOW (ref 3.87–5.11)
RDW: 16.3 % — ABNORMAL HIGH (ref 11.5–15.5)
WBC: 19.6 10*3/uL — ABNORMAL HIGH (ref 4.0–10.5)
nRBC: 0 % (ref 0.0–0.2)

## 2020-11-30 LAB — BASIC METABOLIC PANEL
Anion gap: 9 (ref 5–15)
BUN: 25 mg/dL — ABNORMAL HIGH (ref 8–23)
CO2: 28 mmol/L (ref 22–32)
Calcium: 8 mg/dL — ABNORMAL LOW (ref 8.9–10.3)
Chloride: 102 mmol/L (ref 98–111)
Creatinine, Ser: 0.73 mg/dL (ref 0.44–1.00)
GFR, Estimated: >60 mL/min (ref >60)
Glucose, Bld: 153 mg/dL — ABNORMAL HIGH (ref 70–99)
Potassium: 3.3 mmol/L — ABNORMAL LOW (ref 3.5–5.1)
Sodium: 139 mmol/L (ref 135–145)

## 2020-11-30 LAB — GLUCOSE, CAPILLARY
Glucose-Capillary: 158 mg/dL — ABNORMAL HIGH (ref 70–99)
Glucose-Capillary: 149 mg/dL — ABNORMAL HIGH (ref 70–99)
Glucose-Capillary: 128 mg/dL — ABNORMAL HIGH (ref 70–99)
Glucose-Capillary: 139 mg/dL — ABNORMAL HIGH (ref 70–99)

## 2020-11-30 MED ORDER — TRAMADOL HCL 50 MG PO TABS: 50.0000 mg | ORAL_TABLET | Freq: Four times a day (QID) | ORAL | 0 refills | Status: DC | PRN

## 2020-11-30 MED ORDER — ASPIRIN 81 MG PO CHEW: 81.0000 mg | CHEWABLE_TABLET | Freq: Two times a day (BID) | ORAL | 0 refills | Status: AC

## 2020-11-30 MED ORDER — POTASSIUM CHLORIDE CRYS ER 20 MEQ PO TBCR
40.0000 meq | EXTENDED_RELEASE_TABLET | Freq: Two times a day (BID) | ORAL | Status: AC
  Administered 2020-11-30 – 2020-12-01 (×4): 40 meq via ORAL
  Filled 2020-11-30 (×4): qty 2

## 2020-11-30 NOTE — Evaluation (Signed)
Occupational Therapy Evaluation Patient Details Name: Grace Gilbert MRN: [id number] DOB: 08/21/1932 Today's Date: 11/30/2020    History of Present Illness Patient is an 87 year old female had fall at SNF resulting in R hip femoral neck fracture. S/p R hemiarthroplasty anterior approach. PMH significant for dementia, hypertension and hyperlipidemia.   Clinical Impression   Per chart review patient from [facility] SNF and had a "shuffled gait" at baseline, otherwise unsure what level of assistance patient needed for basic self care/mobility. Currently patient needing total A x2 for bed mobility and attempt sit to stand from edge of bed however patient with posterior lean and unable to extend fully at hips. Initially patient needing min assist for static sitting, progress to fair sitting balance for ~15 minutes and needing total A for oral care and taking sips of water. Acute OT to follow to facilitate increased activity tolerance, balance and ROM to minimize burden of care for ADL tasks.     Follow Up Recommendations  SNF    Equipment Recommendations  None recommended by OT       Precautions / Restrictions Precautions Precautions: Fall Restrictions Weight Bearing Restrictions: No RLE Weight Bearing: Weight bearing as tolerated      Mobility Bed Mobility Overal bed mobility: Needs Assistance Bed Mobility: Rolling;Supine to Sit;Sit to Supine Rolling: Total assist   Supine to sit: Total assist;+2 for physical assistance;+2 for safety/equipment Sit to supine: Total assist;+2 for physical assistance;+2 for safety/equipment   General bed mobility comments: patient does not initiate, utilize bed pad to transition from supine <> sit    Transfers Overall transfer level: Needs assistance Equipment used: 2 person hand held assist Transfers: Sit to/from Stand Sit to Stand: Total assist;+2 physical assistance;+2 safety/equipment         General transfer comment: patient does  not initiate standing with cues, total A with use of bed pad to bring to 3/4 standing position as patient unable to fully extend at hips with posterior lean.    Balance Overall balance assessment: History of Falls;Needs assistance Sitting-balance support: Feet supported Sitting balance-Leahy Scale: Fair Sitting balance - Comments: initially needing external support then progress to maintaining static sitting balance without UE support or therapist   Standing balance support: Bilateral upper extremity supported Standing balance-Leahy Scale: Zero Standing balance comment: total A x2                           ADL either performed or assessed with clinical judgement   ADL Overall ADL's : Needs assistance/impaired Eating/Feeding: Total assistance;Sitting Eating/Feeding Details (indicate cue type and reason): patient unable to hold cup of water. Initially unable to utilize straw therefore spoon fed sips of water. With repeated instruction was able to use straw to take few sips of water as well. due to flexed position of hands, elbows would have difficulty trying to feed herself. Grooming: Oral care;Total assistance;Sitting Grooming Details (indicate cue type and reason): used gren swab and mouth wash to perform oral care, patient initially biting down on swab but then able to moderately cleanse patient's mouth Upper Body Bathing: Total assistance   Lower Body Bathing: Total assistance   Upper Body Dressing : Total assistance   Lower Body Dressing: Total assistance   Toilet Transfer: +2 for physical assistance;Total assistance Toilet Transfer Details (indicate cue type and reason): for sit to stand from edge of bed, unable to come to complete stand/extend at hips Toileting- Clothing Manipulation and  Hygiene: Total assistance;Bed level Toileting - Clothing Manipulation Details (indicate cue type and reason): note pure wick leaked. total A in bed to perform peri care      Functional mobility during ADLs: Total assistance;+2 for physical assistance;+2 for safety/equipment General ADL Comments: unsure of patient's baseline regarding basic self care tasks however currently needing total assistance due to cognition, limited ROM B upper and lower extremities and pain in R hip/arm      Pertinent Vitals/Pain Pain Assessment: Faces Faces Pain Scale: Hurts even more Pain Location: R hip with bed mobility Pain Descriptors / Indicators: Grimacing;Moaning Pain Intervention(s): Limited activity within patient's tolerance;Monitored during session;Ice applied     Hand Dominance  (unable to specify)   Extremity/Trunk Assessment Upper Extremity Assessment Upper Extremity Assessment: RUE deficits/detail;LUE deficits/detail;Difficult to assess due to impaired cognition RUE Deficits / Details: patient keeping arm in flexed position and grimaces with PROM at hand, elbow and shoulder. minimally brings hand to mouth when instructed RUE: Unable to fully assess due to pain LUE Deficits / Details: Patient keeping arm in flexed position, a little more volitional mobility on this side (per chart review patient fell on R elbow and has skin tear) however still significantly limited ROM for functional use. unsure of baseline LUE: Unable to fully assess due to pain   Lower Extremity Assessment Lower Extremity Assessment: Defer to PT evaluation       Communication Communication Communication: No difficulties   Cognition Arousal/Alertness: Awake/alert Behavior During Therapy: WFL for tasks assessed/performed Overall Cognitive Status: History of cognitive impairments - at baseline                                 General Comments: patient was pleasant and interactive with therapists however some answers to questions nonsensical. disoriented x4              Home Living Family/patient expects to be discharged to:: Skilled nursing facility                                         Prior Functioning/Environment          Comments: patient unable to provide history due to dementia. Per chart review she had "shuffled gait" at nursing facility        OT Problem List: Decreased range of motion;Decreased activity tolerance;Impaired balance (sitting and/or standing);Pain;Impaired UE functional use      OT Treatment/Interventions: Self-care/ADL training;Therapeutic activities;Balance training;Therapeutic exercise    OT Goals(Current goals can be found in the care plan section) Acute Rehab OT Goals Patient Stated Goal: "it hurts" OT Goal Formulation: With patient Time For Goal Achievement: 12/14/20 Potential to Achieve Goals: Fair  OT Frequency: Min 2X/week           Co-evaluation PT/OT/SLP Co-Evaluation/Treatment: Yes Reason for Co-Treatment: To address functional/ADL transfers;For patient/therapist safety PT goals addressed during session: Mobility/safety with mobility OT goals addressed during session: ADL's and self-care      AM-PAC OT "6 Clicks" Daily Activity     Outcome Measure Help from another person eating meals?: Total Help from another person taking care of personal grooming?: Total Help from another person toileting, which includes using toliet, bedpan, or urinal?: Total Help from another person bathing (including washing, rinsing, drying)?: Total Help from another person to put on and taking off regular upper body clothing?:  Total Help from another person to put on and taking off regular lower body clothing?: Total 6 Click Score: 6   End of Session Equipment Utilized During Treatment: Oxygen Nurse Communication: Mobility status  Activity Tolerance: Patient tolerated treatment well Patient left: in bed;with call bell/phone within reach;with bed alarm set  OT Visit Diagnosis: Other abnormalities of gait and mobility (R26.89);History of falling (Z91.81);Pain Pain - Right/Left: Right Pain - part of body:  Arm;Hip                Time: [id number] OT Time Calculation (min): 36 min Charges:  OT General Charges $OT Visit: 1 Visit OT Evaluation $OT Eval Low Complexity: [place] OT OT pager: [place] 11/30/2020, 9:17 AM

## 2020-11-30 NOTE — Plan of Care (Signed)
  Problem: Pain Managment: Goal: General experience of comfort will improve Outcome: Progressing   

## 2020-11-30 NOTE — Evaluation (Signed)
Physical Therapy Evaluation Patient Details Name: Grace Gilbert MRN: [id number] DOB: [birth year] Today's Date: 11/30/2020   History of Present Illness  Patient is an 87 year old female had fall at SNF resulting in R hip femoral neck fracture. S/p R hemiarthroplasty anterior approach. PMH significant for dementia, hypertension and hyperlipidemia.  Clinical Impression  The patient is very pleasant, did verbally respond coherently at times, at times  non sensible. Patient  required 2 person total assistance for rolling and sitting on side of bed. Partial stand from bed with 2 total assist. Pt admitted with above diagnosis.  Pt currently with functional limitations due to the deficits listed below (see PT Problem List). Pt will benefit from skilled PT to increase their independence and safety with mobility to allow discharge to the venue listed below.       Follow Up Recommendations SNF    Equipment Recommendations  None recommended by PT    Recommendations for Other Services       Precautions / Restrictions Precautions Precautions: Fall Restrictions Weight Bearing Restrictions: No RLE Weight Bearing: Weight bearing as tolerated      Mobility  Bed Mobility Overal bed mobility: Needs Assistance Bed Mobility: Rolling;Supine to Sit;Sit to Supine Rolling: Total assist   Supine to sit: Total assist;+2 for physical assistance;+2 for safety/equipment Sit to supine: Total assist;+2 for physical assistance;+2 for safety/equipment   General bed mobility comments: patient does not initiate, utilize bed pad to transition from supine <> sit    Transfers Overall transfer level: Needs assistance Equipment used: 2 person hand held assist Transfers: Sit to/from Stand Sit to Stand: Total assist;+2 physical assistance;+2 safety/equipment         General transfer comment: patient does not initiate standing with cues, total A with use of bed pad to bring to 3/4 standing position as patient  unable to fully extend at hips with posterior lean.  Ambulation/Gait                Stairs            Wheelchair Mobility    Modified Rankin (Stroke Patients Only)       Balance Overall balance assessment: History of Falls;Needs assistance Sitting-balance support: Feet supported Sitting balance-Leahy Scale: Fair Sitting balance - Comments: initially needing external support then progress to maintaining static sitting balance without UE support or therapist   Standing balance support: Bilateral upper extremity supported Standing balance-Leahy Scale: Zero Standing balance comment: total A x2                             Pertinent Vitals/Pain Pain Assessment: Faces Faces Pain Scale: Hurts even more Pain Location: R hip with bed mobility Pain Descriptors / Indicators: Grimacing;Moaning Pain Intervention(s): Monitored during session;Repositioned;Ice applied    Home Living Family/patient expects to be discharged to:: Skilled nursing facility                      Prior Function           Comments: patient unable to provide history due to dementia. Per chart review she had "shuffled gait" at nursing facility     Hand Dominance   Dominant Hand:  (unable to specify)    Extremity/Trunk Assessment   Upper Extremity Assessment Upper Extremity Assessment: Defer to OT evaluation RUE Deficits / Details: patient keeping arm in flexed position and grimaces with PROM at hand, elbow and shoulder. minimally  brings hand to mouth when instructed RUE: Unable to fully assess due to pain LUE Deficits / Details: Patient keeping arm in flexed position, a little more volitional mobility on this side (per chart review patient fell on R elbow and has skin tear) however still significantly limited ROM for functional use. unsure of baseline LUE: Unable to fully assess due to pain    Lower Extremity Assessment Lower Extremity Assessment: RLE  deficits/detail RLE Deficits / Details: does not move the leg, moans with PROM and sitting bending the hip    Cervical / Trunk Assessment Cervical / Trunk Assessment: Normal  Communication   Communication: Receptive difficulties;Expressive difficulties (at times does make sensible words, mostly gibberish)  Cognition Arousal/Alertness: Awake/alert Behavior During Therapy: WFL for tasks assessed/performed Overall Cognitive Status: History of cognitive impairments - at baseline                                 General Comments: patient was pleasant and interactive with therapists however some answers to questions nonsensical. disoriented x4      General Comments      Exercises     Assessment/Plan    PT Assessment Patient needs continued PT services  PT Problem List Decreased strength;Decreased mobility;Decreased safety awareness;Decreased range of motion;Decreased knowledge of precautions;Decreased cognition;Decreased balance;Decreased knowledge of use of DME;Pain       PT Treatment Interventions Therapeutic activities;Therapeutic exercise;Cognitive remediation;Functional mobility training;Balance training    PT Goals (Current goals can be found in the Care Plan section)  Acute Rehab PT Goals Patient Stated Goal: "it hurts" PT Goal Formulation: Patient unable to participate in goal setting Time For Goal Achievement: 12/14/20 Potential to Achieve Goals: Fair    Frequency Min 2X/week   Barriers to discharge        Co-evaluation PT/OT/SLP Co-Evaluation/Treatment: Yes Reason for Co-Treatment: For patient/therapist safety PT goals addressed during session: Mobility/safety with mobility OT goals addressed during session: ADL's and self-care       AM-PAC PT "6 Clicks" Mobility  Outcome Measure Help needed turning from your back to your side while in a flat bed without using bedrails?: Total Help needed moving from lying on your back to sitting on the side of  a flat bed without using bedrails?: Total Help needed moving to and from a bed to a chair (including a wheelchair)?: Total Help needed standing up from a chair using your arms (e.g., wheelchair or bedside chair)?: Total Help needed to walk in hospital room?: Total Help needed climbing 3-5 steps with a railing? : Total 6 Click Score: 6    End of Session   Activity Tolerance: Patient tolerated treatment well Patient left: in bed;with call bell/phone within reach;with bed alarm set Nurse Communication: Mobility status PT Visit Diagnosis: Unsteadiness on feet (R26.81)    [id number] PT Time Calculation (min) (ACUTE ONLY): 30 min   Charges:   PT Evaluation $PT Eval Low Complexity: [place] PT Acute Rehabilitation Services Pager [phone] Office [phone]   Claretha Cooper 11/30/2020, 12:00 PM

## 2020-11-30 NOTE — Plan of Care (Signed)
  Problem: Pain Managment: Goal: General experience of comfort will improve Outcome: Progressing   Problem: Activity: Goal: Risk for activity intolerance will decrease Outcome: Progressing   

## 2020-11-30 NOTE — Evaluation (Signed)
Clinical/Bedside Swallow Evaluation Patient Details  Name: Grace Gilbert MRN: [id number] Date of Birth: Nov 27, 1932  Today's Date: 11/30/2020 Time: SLP Start Time (ACUTE ONLY): 1200 SLP Stop Time (ACUTE ONLY): 1225 SLP Time Calculation (min) (ACUTE ONLY): 25 min  Past Medical History:  Past Medical History:  Diagnosis Date   Abnormal CT scan, chest September 2011   Scar tissue   Allergic rhinitis    Alzheimer's dementia ([place])    Asthma    Diabetes mellitus    borderline and under control-diet ,exercise   Esophageal stricture    Family history of colon cancer    Mother   GERD (gastroesophageal reflux disease)    Hiatal hernia    HLD (hyperlipidemia)    Hypertension    Osteopenia    Pneumonia 12-15 yrs.ago   yrs. ago   TIA (transient ischemic attack)    Vitamin D deficiency    Past Surgical History:  Past Surgical History:  Procedure Laterality Date   ABDOMINAL HYSTERECTOMY  1980    BALLOON DILATION N/A 09/27/2018   Procedure: BALLOON DILATION;  Surgeon: Jackquline Denmark, MD;  Location: [facility];  Service: Endoscopy;  Laterality: N/A;   BIOPSY  09/27/2018   Procedure: BIOPSY;  Surgeon: Jackquline Denmark, MD;  Location: [facility];  Service: Endoscopy;;   CATARACT EXTRACTION Left 2014   ESOPHAGOGASTRODUODENOSCOPY (EGD) WITH PROPOFOL N/A 09/27/2018   Procedure: ESOPHAGOGASTRODUODENOSCOPY (EGD) WITH PROPOFOL;  Surgeon: Jackquline Denmark, MD;  Location: [facility];  Service: Endoscopy;  Laterality: N/A;   SHOULDER OPEN ROTATOR CUFF REPAIR  05/22/2011   Procedure: ROTATOR CUFF REPAIR SHOULDER OPEN;  Surgeon: Tobi Bastos, MD;  Location: [facility];  Service: Orthopedics;  Laterality: Left;   THORACOTOMY / DECORTICATION PARIETAL PLEURA Right 204   empyema   TONSILLECTOMY     HPI:  87yo female admitted from [facility] after a fall resulting in R femoral neck fracture. PMH: dementia, HTN, HLD, DM, esophageal stricture, GERD, Hiatal hernia, PNA. CXR = Chronic appearing increased lung  markings with findings suggestive of mild pulmonary vascular congestion.   Assessment / Plan / Recommendation Clinical Impression  Pt seen at bedside for assessment of swallow function and safety. Pt is known to [facility], having had bedside and instrumental assessments in 2020. Recommendations at that time were for dys 2 diet and thin liquids, crushed meds. Today, pt was seated upright in bed getting ready to be fed lunch. She was awake and alert, mumbling, largely unintelligible, asking the whereabouts of her mother. Pt presents with adequate natural dentition. No obvious facial asymmetry. Pt was observed with regular consistency solids and thin liquids. These were presented in 1/2-1tsp boluses. Extended oral prep noted on solid textures with mild oral residue after the swallow. Thin liquids were tolerated via straw without overt s/s aspiration. Congested cough was noted x2 during this assessment. Will downgrade diet to dys 2 (finely chopped) with thin liquids. Recommend crushed meds and adherence to esophageal precautions, given history of stricture, hiatal hernia, and GERD. Safe swallow precautions and esophageal precautions were posted at [facility]. RN and MD informed. SLP will follow briefly for education.  SLP Visit Diagnosis: Dysphagia, unspecified (R13.10)    Aspiration Risk  Moderate aspiration risk;Mild aspiration risk;Risk for inadequate nutrition/hydration    Diet Recommendation Dysphagia 2 (Fine chop);Thin liquid   Liquid Administration via: Straw Medication Administration: Crushed with puree Supervision: Full supervision/cueing for compensatory strategies Compensations: Minimize environmental distractions;Slow rate;Small sips/bites Postural Changes: Seated upright at 90 degrees;Remain upright for at least 30  minutes after po intake    Other  Recommendations Oral Care Recommendations: Oral care BID   Follow up Recommendations 24 hour supervision/assistance;Skilled Nursing facility       Frequency and Duration min 1 x/week  1 week       Prognosis Prognosis for Safe Diet Advancement: Guarded Barriers to Reach Goals: Cognitive deficits      Swallow Study   General Date of Onset: 12/28/20 HPI: 87yo female admitted from [facility] 11/27/20 after a fall resulting in R femoral neck fracture. PMH: dementia, HTN, HLD, DM, esophageal stricture, GERD, Hiatal hernia, PNA. CXR = Chronic appearing increased lung markings with findings suggestive of mild pulmonary vascular congestion. Type of Study: Bedside Swallow Evaluation Previous Swallow Assessment: MBS June 2020 - dys2/thin, crushed meds. "Pt is a very inefficent consumer therefore recommend to continue to maximize liquid nutritional intake.  Given her dementia causing lack of initiation to eat, risk for dehydration and malnutrition likely present/ongoing" Diet Prior to this Study: Regular;Thin liquids Temperature Spikes Noted: No Respiratory Status: Nasal cannula History of Recent Intubation: No Behavior/Cognition: Alert;Cooperative;Pleasant mood;Confused;Requires cueing;Distractible Oral Cavity Assessment: Within Functional Limits Oral Care Completed by SLP: No Oral Cavity - Dentition: Adequate natural dentition Vision: Functional for self-feeding Self-Feeding Abilities: Total assist Patient Positioning: Upright in bed Baseline Vocal Quality: Low vocal intensity Volitional Cough: Weak Volitional Swallow: Able to elicit    Oral/Motor/Sensory Function Overall Oral Motor/Sensory Function: Generalized oral weakness   Ice Chips Ice chips: Not tested   Thin Liquid Thin Liquid: Within functional limits Presentation: Straw    Nectar Thick Nectar Thick Liquid: Not tested   Honey Thick Honey Thick Liquid: Not tested   Puree Puree: Within functional limits Presentation: Spoon   Solid     Solid: Impaired Oral Phase Impairments: Impaired mastication Oral Phase Functional Implications: Impaired mastication;Prolonged oral  transit;Oral residue Other Comments: cough noted x1 during lunch     Stefanie Hodgens B. Quentin Ore, [facility], [place] Speech Language Pathologist Office: [phone]  Shonna Chock 11/30/2020,12:54 PM

## 2020-11-30 NOTE — Progress Notes (Signed)
PROGRESS NOTE    Grace Gilbert  [id number] DOB: October 24, 1932 DOA: 11/27/2020 PCP: [name], MD    Brief Narrative:  87 year old female with history of hypertension and hyperlipidemia, advanced dementia and nonverbal presented by EMS with fall at a skilled nursing facility.  In the emergency room, hemodynamically stable.  CBC 15,000.  X-ray with right femoral neck fracture.   Assessment & Plan:   Principal Problem:   Closed displaced fracture of right femoral neck (HCC) Active Problems:   Hypertension   Alzheimer's dementia ([place])   Normocytic anemia  Close traumatic fracture of the right femoral neck: Status post right hip hemiarthroplasty 8/31, Dr. Lyla Glassing Adequate pain medications.  Weightbearing as tolerated.  Working with PT OT today. Anticipate discharge back to nursing home for rehab and ultimately to long-term care. Continue IV fluid hydration until oral intake.  Essential hypertension: Blood pressure stable.  Alzheimer's dementia: Long-term nursing home resident.  Stable.  Continue supportive care.  Leukocytosis: Probably reactive.  No evidence of infection.  Will monitor.  Urinalysis was clear.  Work with PT OT.  Recheck electrolytes and CBC tomorrow morning.  Anticipate she can be discharged back to [facility] for [facility] where she is a long-term resident.   DVT prophylaxis: SCDs Start: 11/29/20 1736   Code Status: DNR Family Communication: Patient's cousin, Dr. Aron Baba on the phone. Disposition Plan: Status is: Inpatient  Remains inpatient appropriate because:Inpatient level of care appropriate due to severity of illness  Dispo: The patient is from: SNF              Anticipated d/c is to: SNF              Patient currently is not medically stable to d/c.   Difficult to place patient No    Consultants:  Orthopedics  Procedures:  Right hip hemiarthroplasty 8/31  Antimicrobials:  None   Subjective: Patient seen and examined.  Unable to  talk much but she looks comfortable and was happy when nursing students were feeding her.  Denies any other complaints. Discussed with her cousin who is her healthcare power of attorney and she is stated patient is on some kind of modified diet, speech therapy evaluation today. Patient unable to explain if she has any pain on her legs.  Objective: Vitals:   11/29/20 2105 11/30/20 0243 11/30/20 0616 11/30/20 0731  BP: 118/77 (!) 146/52 (!) 141/73 140/72  Pulse: 88 79 78 81  Resp: '18 17 17 18  '$ Temp: 98.8 F (37.1 C) 98.8 F (37.1 C) 99.3 F (37.4 C) 98.8 F (37.1 C)  TempSrc: Oral Oral Oral Oral  SpO2: 96% 96% 96% 95%  Weight:      Height:        Intake/Output Summary (Last 24 hours) at 11/30/2020 1151 Last data filed at 11/30/2020 1046 Gross per 24 hour  Intake 1546.96 ml  Output 410 ml  Net 1136.96 ml   Filed Weights   11/28/20 2026  Weight: 62.2 kg    Examination:  General: Chronically sick looking.  Not in any distress.  On room air. Looks comfortable sitting up in the bed.  Leukocytosis Cardiovascular: S1-S2 normal.  Regular rate rhythm. Respiratory: Bilateral clear. Gastrointestinal: Soft and nontender. Ext: Right hip with immediate postop dressing.  Distal neurovascular status intact. Neuro: Alert and awake.  Not oriented.  Speaks few words but incomprehensible. Does not follow any commands.   Data Reviewed: I have personally reviewed following labs and imaging studies  CBC: Recent  Labs  Lab 11/27/20 2340 11/28/20 0520 11/29/20 0435 11/30/20 0335  WBC 15.0* 14.0* 20.6* 19.6*  NEUTROABS 11.1*  --  16.5*  --   HGB 11.8* 11.2* 12.2 9.3*  HCT 36.1 34.6* 38.0 28.3*  MCV 92.6 93.5 94.5 93.4  PLT 223 205 224 [id number]   Basic Metabolic Panel: Recent Labs  Lab 11/27/20 2340 11/28/20 0520 11/29/20 0435 11/30/20 0335  NA 141 141 143 139  K 3.9 4.1 3.7 3.3*  CL 106 107 103 102  CO2 '27 29 30 28  '$ GLUCOSE 119* 122* 144* 153*  BUN '12 12 22 '$ 25*  CREATININE 0.58  0.48 0.66 0.73  CALCIUM 9.0 8.8* 9.1 8.0*  MG  --   --  2.1  --    GFR: Estimated Creatinine Clearance: 42.4 mL/min (by C-G formula based on SCr of 0.73 mg/dL). Liver Function Tests: No results for input(s): AST, ALT, ALKPHOS, BILITOT, PROT, ALBUMIN in the last 168 hours. No results for input(s): LIPASE, AMYLASE in the last 168 hours. No results for input(s): AMMONIA in the last 168 hours. Coagulation Profile: Recent Labs  Lab 11/27/20 2340  INR 1.0   Cardiac Enzymes: No results for input(s): CKTOTAL, CKMB, CKMBINDEX, TROPONINI in the last 168 hours. BNP (last 3 results) No results for input(s): PROBNP in the last 8760 hours. HbA1C: No results for input(s): HGBA1C in the last 72 hours. CBG: Recent Labs  Lab 11/28/20 0608 11/29/20 1650 11/29/20 2111 11/30/20 0733 11/30/20 1140  GLUCAP 122* 191* 187* 158* 149*   Lipid Profile: No results for input(s): CHOL, HDL, LDLCALC, TRIG, CHOLHDL, LDLDIRECT in the last 72 hours. Thyroid Function Tests: No results for input(s): TSH, T4TOTAL, FREET4, T3FREE, THYROIDAB in the last 72 hours. Anemia Panel: No results for input(s): VITAMINB12, FOLATE, FERRITIN, TIBC, IRON, RETICCTPCT in the last 72 hours. Sepsis Labs: No results for input(s): PROCALCITON, LATICACIDVEN in the last 168 hours.  Recent Results (from the past 240 hour(s))  SARS CORONAVIRUS 2 (TAT 6-24 HRS) Nasopharyngeal Nasopharyngeal Swab     Status: None   Collection Time: 11/27/20 11:45 PM   Specimen: Nasopharyngeal Swab  Result Value Ref Range Status   SARS Coronavirus 2 NEGATIVE NEGATIVE Final    Comment: (NOTE) SARS-CoV-2 target nucleic acids are NOT DETECTED.  The SARS-CoV-2 RNA is generally detectable in upper and lower respiratory specimens during the acute phase of infection. Negative results do not preclude SARS-CoV-2 infection, do not rule out co-infections with other pathogens, and should not be used as the sole basis for treatment or other patient  management decisions. Negative results must be combined with clinical observations, patient history, and epidemiological information. The expected result is Negative.  Fact Sheet for Patients: SugarRoll.be  Fact Sheet for Healthcare Providers: https://www.woods-mathews.com/  This test is not yet approved or cleared by the [place] FDA and  has been authorized for detection and/or diagnosis of SARS-CoV-2 by FDA under an Emergency Use Authorization (EUA). This EUA will remain  in effect (meaning this test can be used) for the duration of the COVID-19 declaration under Se ction 564(b)(1) of the Act, 21 U.S.C. section 360bbb-3(b)(1), unless the authorization is terminated or revoked sooner.  Performed at [facility], [place] [address], [place], [place] [address]   Surgical pcr screen     Status: Abnormal   Collection Time: 11/28/20 10:49 PM   Specimen: Nasal Mucosa; Nasal Swab  Result Value Ref Range Status   MRSA, PCR NEGATIVE NEGATIVE Final   Staphylococcus aureus POSITIVE (A)  NEGATIVE Final    Comment: (NOTE) The Xpert SA Assay (FDA approved for NASAL specimens in patients 20 years of age and older), is one component of a comprehensive surveillance program. It is not intended to diagnose infection nor to guide or monitor treatment. Performed at [facility], [place] [address] [place], [place] [address]   Surgical pcr screen     Status: Abnormal   Collection Time: 11/29/20  7:57 AM   Specimen: Nasal Mucosa; Nasal Swab  Result Value Ref Range Status   MRSA, PCR NEGATIVE NEGATIVE Final   Staphylococcus aureus POSITIVE (A) NEGATIVE Final    Comment: (NOTE) The Xpert SA Assay (FDA approved for NASAL specimens in patients 18 years of age and older), is one component of a comprehensive surveillance program. It is not intended to diagnose infection nor to guide or monitor treatment. Performed at [facility], [place] [address], [place], [place] [address]          Radiology Studies: Pelvis Portable  Result Date: 11/29/2020 CLINICAL DATA:  Right hip replacement anterior approach EXAM: PORTABLE PELVIS 1-2 VIEWS COMPARISON:  11/28/2020 FINDINGS: Right hip replacement in satisfactory position alignment. Hemiarthroplasty prosthesis. No fracture or complication. IMPRESSION: Satisfactory right hip replacement Electronically Signed   By: Franchot Gallo M.D.   On: 11/29/2020 17:04   DG C-Arm 1-60 Min-No Report  Result Date: 11/29/2020 Fluoroscopy was utilized by the requesting physician.  No radiographic interpretation.   DG HIP OPERATIVE UNILAT W OR W/O PELVIS RIGHT  Result Date: 11/29/2020 CLINICAL DATA:  Hip arthroplasty EXAM: OPERATIVE RIGHT HIP (WITH PELVIS IF PERFORMED)  VIEWS TECHNIQUE: Fluoroscopic spot image(s) were submitted for interpretation post-operatively. COMPARISON:  None. FINDINGS: Intraoperative fluoroscopic views of the right hip demonstrate total arthroplasty. No obvious perihardware fracture or component malpositioning. IMPRESSION: Intraoperative fluoroscopic views of the right hip demonstrate total arthroplasty. No obvious perihardware fracture or component malpositioning. Electronically Signed   By: Eddie Candle M.D.   On: 11/29/2020 14:00        Scheduled Meds:  aspirin EC  325 mg Oral Q breakfast   calcium-vitamin D  1 tablet Oral Q breakfast   chlorhexidine  5 mL Mouth/Throat Daily   docusate sodium  100 mg Oral BID   loratadine  10 mg Oral QHS   memantine  28 mg Oral QHS   metoCLOPramide  5 mg Oral BID   pantoprazole  40 mg Oral Daily   senna  1 tablet Oral BID   Continuous Infusions:  sodium chloride 50 mL/hr at 11/29/20 2200   methocarbamol (ROBAXIN) IV       LOS: 2 days    Time spent: 30 minutes    Barb Merino, MD [facility] Pager [phone]

## 2020-11-30 NOTE — Progress Notes (Signed)
    Subjective:  No events o/n. Patient mostly mumbles incomprehensibly. She did ask to see her mother.   Objective:   VITALS:   Vitals:   11/29/20 2105 11/30/20 0243 11/30/20 0616 11/30/20 0731  BP: 118/77 (!) 146/52 (!) 141/73 140/72  Pulse: 88 79 78 81  Resp: '18 17 17 18  '$ Temp: 98.8 F (37.1 C) 98.8 F (37.1 C) 99.3 F (37.4 C) 98.8 F (37.1 C)  TempSrc: Oral Oral Oral Oral  SpO2: 96% 96% 96% 95%  Weight:      Height:        NAD Intact pulses distally Incision: dressing C/D/I Unable to assess sensory due to MS Will wiggle toes on command inconsistently  Lab Results  Component Value Date   WBC 19.6 (H) 11/30/2020   HGB 9.3 (L) 11/30/2020   HCT 28.3 (L) 11/30/2020   MCV 93.4 11/30/2020   PLT 198 11/30/2020   BMET    Component Value Date/Time   NA 139 11/30/2020 0335   NA 141 07/28/2020 0000   K 3.3 (L) 11/30/2020 0335   CL 102 11/30/2020 0335   CL 104 01/08/2019 0000   CO2 28 11/30/2020 0335   CO2 28 01/08/2019 0000   GLUCOSE 153 (H) 11/30/2020 0335   BUN 25 (H) 11/30/2020 0335   BUN 13 07/28/2020 0000   CREATININE 0.73 11/30/2020 0335   CREATININE 0.73 04/06/2018 0000   CALCIUM 8.0 (L) 11/30/2020 0335   CALCIUM 9.3 01/08/2019 0000   GFRNONAA >60 11/30/2020 0335   GFRNONAA 76 04/06/2018 0000   GFRAA 92 07/28/2020 0000   GFRAA 88 04/06/2018 0000     Assessment/Plan: 1 Day Post-Op   Principal Problem:   Closed displaced fracture of right femoral neck (HCC) Active Problems:   Hypertension   Alzheimer's dementia (HCC)   Normocytic anemia   WBAT with walker DVT ppx: Aspirin, SCDs, TEDS PO pain control PT/OT Dispo: D/C to SNF when medically ready   Hilton Cork Tevis Conger 11/30/2020, 11:54 AM   Rod Can, MD [phone] [place] [facility] now [facility]  [facility] [address], [address], [place], [place] [address] Phone: [phone] [email or web address] Facebook  [employer]

## 2020-12-01 ENCOUNTER — Inpatient Hospital Stay (HOSPITAL_COMMUNITY): Payer: Medicare PPO

## 2020-12-01 LAB — BASIC METABOLIC PANEL
Anion gap: 7 (ref 5–15)
BUN: 17 mg/dL (ref 8–23)
CO2: 27 mmol/L (ref 22–32)
Calcium: 7.8 mg/dL — ABNORMAL LOW (ref 8.9–10.3)
Chloride: 103 mmol/L (ref 98–111)
Creatinine, Ser: 0.52 mg/dL (ref 0.44–1.00)
GFR, Estimated: >60 mL/min (ref >60)
Glucose, Bld: 144 mg/dL — ABNORMAL HIGH (ref 70–99)
Potassium: 4.1 mmol/L (ref 3.5–5.1)
Sodium: 137 mmol/L (ref 135–145)

## 2020-12-01 LAB — CBC
HCT: 24.9 % — ABNORMAL LOW (ref 36.0–46.0)
Hemoglobin: 8.1 g/dL — ABNORMAL LOW (ref 12.0–15.0)
MCH: 30.6 pg (ref 26.0–34.0)
MCHC: 32.5 g/dL (ref 30.0–36.0)
MCV: 94 fL (ref 80.0–100.0)
Platelets: 191 10*3/uL (ref 150–400)
RBC: 2.65 MIL/uL — ABNORMAL LOW (ref 3.87–5.11)
RDW: 16.5 % — ABNORMAL HIGH (ref 11.5–15.5)
WBC: 14 10*3/uL — ABNORMAL HIGH (ref 4.0–10.5)
nRBC: 0.1 % (ref 0.0–0.2)

## 2020-12-01 LAB — GLUCOSE, CAPILLARY
Glucose-Capillary: 108 mg/dL — ABNORMAL HIGH (ref 70–99)
Glucose-Capillary: 127 mg/dL — ABNORMAL HIGH (ref 70–99)
Glucose-Capillary: 129 mg/dL — ABNORMAL HIGH (ref 70–99)
Glucose-Capillary: 126 mg/dL — ABNORMAL HIGH (ref 70–99)

## 2020-12-01 LAB — RESP PANEL BY RT-PCR (FLU A&B, COVID) ARPGX2
Influenza A by PCR: NEGATIVE
Influenza B by PCR: NEGATIVE
SARS Coronavirus 2 by RT PCR: NEGATIVE

## 2020-12-01 MED ORDER — FUROSEMIDE 10 MG/ML IJ SOLN
40.0000 mg | Freq: Once | INTRAMUSCULAR | Status: AC
  Administered 2020-12-01: 40 mg via INTRAVENOUS
  Filled 2020-12-01: qty 4

## 2020-12-01 NOTE — Plan of Care (Signed)
  Problem: Activity: Goal: Risk for activity intolerance will decrease Outcome: Progressing   Problem: Nutrition: Goal: Adequate nutrition will be maintained Outcome: Progressing   Problem: Pain Managment: Goal: General experience of comfort will improve Outcome: Progressing   

## 2020-12-01 NOTE — TOC Progression Note (Signed)
Transition of Care [facility]) - Progression Note    Patient Details  Name: Grace Gilbert MRN: [id number] Date of Birth: 08-10-1932  Transition of Care [facility]) CM/SW Contact  Lennart Pall, LCSW Phone Number: 12/01/2020, 2:35 PM  Clinical Narrative:    Per MD, not medically ready to dc to SNF today - possibly tomorrow.  COVID test ordered.  [facility] is submitting for insurance auth and can admit pt tomorrow if cleared.  Continue to follow.        Expected Discharge Plan and Services           Expected Discharge Date: 12/01/20                                     Social Determinants of Health (SDOH) Interventions    Readmission Risk Interventions Readmission Risk Prevention Plan 09/22/2018  Home Care Screening Complete  Some recent data might be hidden

## 2020-12-01 NOTE — Plan of Care (Signed)
  Problem: Pain Managment: Goal: General experience of comfort will improve Outcome: Progressing   

## 2020-12-01 NOTE — NC FL2 (Signed)
[place] LEVEL OF CARE SCREENING TOOL     IDENTIFICATION  Patient Name: Grace Gilbert Birthdate: [birth year] Sex: female Admission Date (Current Location): 11/27/2020  [facility] and [place] Number:  Herbalist and Address:  [name],  [place], [place]      Provider Number: [id number]  Attending Physician Name and Address:  Barb Merino, MD  Relative Name and Phone Number:  Yesica Chelette  [phone]    Current Level of Care: Hospital Recommended Level of Care: [place] Prior Approval Number:    Date Approved/Denied:   PASRR Number: [id number]  Discharge Plan: SNF    Current Diagnoses: Patient Active Problem List   Diagnosis Date Noted   Closed displaced fracture of right femoral neck ([place]) 11/28/2020   Frequent falls 08/15/2020   Aspiration pneumonia ([place]) 02/07/2020   Generalized weakness 02/07/2020   Fall 12/31/2019   Choking episode 11/19/2019   Choking 08/27/2019   Weight gain 06/22/2019   GERD (gastroesophageal reflux disease) 05/25/2019   Unsteady gait 01/06/2019   Depression, recurrent ([place]) 01/06/2019   Dysphagia    Pleural effusion on left    FTT (failure to thrive) in adult    Vascular dementia without behavioral disturbance (HCC)    Normocytic anemia    Thrombocytosis    HCAP (healthcare-associated pneumonia) 09/17/2018   Allergic rhinitis 06/02/2017   History of TIA (transient ischemic attack) 03/05/2017   History of rotator cuff tear 03/05/2017   Age-related osteoporosis without current pathological fracture 12/09/2016   Prediabetes 11/20/2016   Hypertension    Alzheimer's dementia ([place])    HLD (hyperlipidemia)    Esophageal stricture     Orientation RESPIRATION BLADDER Height & Weight     Self  O2 Incontinent Weight: 137 lb 2 oz (62.2 kg) Height:  5' (152.4 cm)  BEHAVIORAL SYMPTOMS/MOOD NEUROLOGICAL BOWEL NUTRITION STATUS      Incontinent Diet (Dys 2,  thin liquids)  AMBULATORY STATUS COMMUNICATION OF NEEDS Skin   Extensive Assist Verbally Surgical wounds                       Personal Care Assistance Level of Assistance              Functional Limitations Info  Hearing   Hearing Info: Impaired      SPECIAL CARE FACTORS FREQUENCY  PT (By licensed PT), OT (By licensed OT)     PT Frequency: 5x/wk OT Frequency: 5x/wk            Contractures Contractures Info: Not present    Additional Factors Info                  Current Medications (12/01/2020):  This is the current hospital active medication list Current Facility-Administered Medications  Medication Dose Route Frequency Provider Last Rate Last Admin   0.9 %  sodium chloride infusion   Intravenous Continuous Rod Can, MD 50 mL/hr at 11/30/20 1754 Infusion Verify at 11/30/20 1754   acetaminophen (TYLENOL) tablet 325-650 mg  325-650 mg Oral Q6H PRN Rod Can, MD   650 mg at 11/30/20 2309   aspirin EC tablet 325 mg  325 mg Oral Q breakfast Rod Can, MD   325 mg at 12/01/20 [id number]   calcium-vitamin D (OSCAL WITH D) 500-200 MG-UNIT per tablet 1 tablet  1 tablet Oral Q breakfast Barb Merino, MD   1 tablet at 12/01/20 0837   chlorhexidine (PERIDEX) 0.12 %  solution 5 mL  5 mL Mouth/Throat Daily Swinteck, Aaron Edelman, MD   5 mL at 12/01/20 [id number]   docusate sodium (COLACE) capsule 100 mg  100 mg Oral BID Rod Can, MD   100 mg at 12/01/20 [id number]   hydrALAZINE (APRESOLINE) injection 5 mg  5 mg Intravenous Q6H PRN Rod Can, MD   5 mg at 11/28/20 1837   HYDROcodone-acetaminophen (NORCO) 7.5-325 MG per tablet 1-2 tablet  1-2 tablet Oral Q4H PRN Rod Can, MD       HYDROcodone-acetaminophen (NORCO/VICODIN) 5-325 MG per tablet 1-2 tablet  1-2 tablet Oral Q4H PRN Swinteck, Aaron Edelman, MD       loratadine (CLARITIN) tablet 10 mg  10 mg Oral QHS Swinteck, Aaron Edelman, MD   10 mg at 11/30/20 2309   memantine (NAMENDA XR) 24 hr capsule 28 mg  28 mg Oral QHS  Swinteck, Aaron Edelman, MD   28 mg at 11/30/20 2328   menthol-cetylpyridinium (CEPACOL) lozenge 3 mg  1 lozenge Oral PRN Swinteck, Aaron Edelman, MD       Or   phenol (CHLORASEPTIC) mouth spray 1 spray  1 spray Mouth/Throat PRN Swinteck, Aaron Edelman, MD       methocarbamol (ROBAXIN) tablet 500 mg  500 mg Oral Q6H PRN Rod Can, MD   500 mg at 11/30/20 2309   Or   methocarbamol (ROBAXIN) 500 mg in dextrose 5 % 50 mL IVPB  500 mg Intravenous Q6H PRN Swinteck, Aaron Edelman, MD       metoCLOPramide (REGLAN) tablet 5-10 mg  5-10 mg Oral Q8H PRN Swinteck, Aaron Edelman, MD       Or   metoCLOPramide (REGLAN) injection 5-10 mg  5-10 mg Intravenous Q8H PRN Swinteck, Aaron Edelman, MD       metoCLOPramide (REGLAN) tablet 5 mg  5 mg Oral BID Swinteck, Aaron Edelman, MD   5 mg at 12/01/20 [id number]   morphine 2 MG/ML injection 0.5-1 mg  0.5-1 mg Intravenous Q2H PRN Swinteck, Aaron Edelman, MD       ondansetron [facility]) tablet 4 mg  4 mg Oral Q6H PRN Swinteck, Aaron Edelman, MD       Or   ondansetron (ZOFRAN) injection 4 mg  4 mg Intravenous Q6H PRN Swinteck, Aaron Edelman, MD       pantoprazole (PROTONIX) EC tablet 40 mg  40 mg Oral Daily Swinteck, Aaron Edelman, MD   40 mg at 12/01/20 [id number]   polyethylene glycol (MIRALAX / GLYCOLAX) packet 17 g  17 g Oral Daily PRN Swinteck, Aaron Edelman, MD       potassium chloride SA (KLOR-CON) CR tablet 40 mEq  40 mEq Oral BID Barb Merino, MD   40 mEq at 12/01/20 [id number]   senna (SENOKOT) tablet 8.6 mg  1 tablet Oral BID Rod Can, MD   8.6 mg at 12/01/20 0837   zolpidem (AMBIEN) tablet 5 mg  5 mg Oral QHS PRN Rod Can, MD         Discharge Medications: Please see discharge summary for a list of discharge medications.  Relevant Imaging Results:  Relevant Lab Results:   Additional Information [id number]  Katalyn Matin, LCSW

## 2020-12-01 NOTE — Progress Notes (Signed)
PROGRESS NOTE    Grace Gilbert  [id number] DOB: 02-26-1933 DOA: 11/27/2020 PCP: [name], MD    Brief Narrative:  87 year old female with history of hypertension and hyperlipidemia, advanced dementia and nonverbal presented by EMS with fall at a skilled nursing facility.  In the emergency room, hemodynamically stable.  CBC 15,000.  X-ray with right femoral neck fracture.   Assessment & Plan:   Principal Problem:   Closed displaced fracture of right femoral neck (HCC) Active Problems:   Hypertension   Alzheimer's dementia ([place])   Normocytic anemia  Close traumatic fracture of the right femoral neck: Status post right hip hemiarthroplasty 8/31, Dr. Lyla Glassing Adequate pain medications.  Weightbearing as tolerated.  Working with PT OT Anticipate discharge back to nursing home for rehab and ultimately to long-term care.  Essential hypertension: Blood pressure stable.  Alzheimer's dementia: Long-term nursing home resident.  Stable.  Continue supportive care.  Leukocytosis: Probably reactive.  No evidence of infection.  Will monitor.  Urinalysis was clear.  Acute hypoxemic respiratory failure: Patient noted to be on supplemental oxygen since admission.  Chest x-ray with evidence of prominent bronchovascular markings.  She does have history of chronic recurrent aspiration pneumonia, however currently less likely pneumonia. Wean off oxygen.  May keep on supplemental oxygen to keep saturation more than 90%.  Discontinue IV fluids. Give 1 dose of IV Lasix today.  Once her oxygenation improves, she will be able to go back to skilled nursing facility.  Anemia of acute blood loss: Anticipated from long bone fractures and also from procedure.  Hemoglobin is 8.1.  No evidence of active bleeding.  Will monitor.   DVT prophylaxis: SCDs Start: 11/29/20 1736   Code Status: DNR Family Communication: Patient's cousin unable to pick up phone.  I called and discussed her care with  nursing staff at friend's house. Disposition Plan: Status is: Inpatient  Remains inpatient appropriate because:Inpatient level of care appropriate due to severity of illness  Dispo: The patient is from: SNF              Anticipated d/c is to: SNF              Patient currently is not medically stable to d/c.   Difficult to place patient No    Consultants:  Orthopedics  Procedures:  Right hip hemiarthroplasty 8/31  Antimicrobials:  None   Subjective: Patient seen and examined.  Nonverbal and unable to give any complaints as usual. Noted to be on oxygen.  Not in any obvious distress.  Objective: Vitals:   11/30/20 1300 11/30/20 2201 12/01/20 0522 12/01/20 1313  BP: 133/65 124/69 (!) 135/58 138/66  Pulse: 77 79 78 68  Resp: '17 17 17 18  '$ Temp: 98.6 F (37 C) 98.8 F (37.1 C) 98.6 F (37 C) 97.7 F (36.5 C)  TempSrc: Oral Oral Oral Oral  SpO2: 97% 95% 100% 98%  Weight:      Height:        Intake/Output Summary (Last 24 hours) at 12/01/2020 1422 Last data filed at 12/01/2020 0522 Gross per 24 hour  Intake 1308.97 ml  Output 350 ml  Net 958.97 ml   Filed Weights   11/28/20 2026  Weight: 62.2 kg   Examination:  General: Chronically sick looking.  Not in any distress.  SpO2: 98 % O2 Flow Rate (L/min): 6 L/min  Looks comfortable sitting up in the bed.   Cardiovascular: S1-S2 normal.  Regular rate rhythm. Respiratory: Bilateral clear. Gastrointestinal: Soft and  nontender. Ext: Right hip with immediate postop dressing.  Distal neurovascular status intact. Neuro: Alert and awake.  Not oriented.  Speaks few words but incomprehensible. Does not follow any commands.   Data Reviewed: I have personally reviewed following labs and imaging studies  CBC: Recent Labs  Lab 11/27/20 2340 11/28/20 0520 11/29/20 0435 11/30/20 0335 12/01/20 0320  WBC 15.0* 14.0* 20.6* 19.6* 14.0*  NEUTROABS 11.1*  --  16.5*  --   --   HGB 11.8* 11.2* 12.2 9.3* 8.1*  HCT 36.1 34.6*  38.0 28.3* 24.9*  MCV 92.6 93.5 94.5 93.4 94.0  PLT 223 205 224 198 [id number]   Basic Metabolic Panel: Recent Labs  Lab 11/27/20 2340 11/28/20 0520 11/29/20 0435 11/30/20 0335 12/01/20 0320  NA 141 141 143 139 137  K 3.9 4.1 3.7 3.3* 4.1  CL 106 107 103 102 103  CO2 '27 29 30 28 27  '$ GLUCOSE 119* 122* 144* 153* 144*  BUN '12 12 22 '$ 25* 17  CREATININE 0.58 0.48 0.66 0.73 0.52  CALCIUM 9.0 8.8* 9.1 8.0* 7.8*  MG  --   --  2.1  --   --    GFR: Estimated Creatinine Clearance: 42.4 mL/min (by C-G formula based on SCr of 0.52 mg/dL). Liver Function Tests: No results for input(s): AST, ALT, ALKPHOS, BILITOT, PROT, ALBUMIN in the last 168 hours. No results for input(s): LIPASE, AMYLASE in the last 168 hours. No results for input(s): AMMONIA in the last 168 hours. Coagulation Profile: Recent Labs  Lab 11/27/20 2340  INR 1.0   Cardiac Enzymes: No results for input(s): CKTOTAL, CKMB, CKMBINDEX, TROPONINI in the last 168 hours. BNP (last 3 results) No results for input(s): PROBNP in the last 8760 hours. HbA1C: No results for input(s): HGBA1C in the last 72 hours. CBG: Recent Labs  Lab 11/30/20 1140 11/30/20 1552 11/30/20 2212 12/01/20 0738 12/01/20 1130  GLUCAP 149* 128* 139* 108* 127*   Lipid Profile: No results for input(s): CHOL, HDL, LDLCALC, TRIG, CHOLHDL, LDLDIRECT in the last 72 hours. Thyroid Function Tests: No results for input(s): TSH, T4TOTAL, FREET4, T3FREE, THYROIDAB in the last 72 hours. Anemia Panel: No results for input(s): VITAMINB12, FOLATE, FERRITIN, TIBC, IRON, RETICCTPCT in the last 72 hours. Sepsis Labs: No results for input(s): PROCALCITON, LATICACIDVEN in the last 168 hours.  Recent Results (from the past 240 hour(s))  SARS CORONAVIRUS 2 (TAT 6-24 HRS) Nasopharyngeal Nasopharyngeal Swab     Status: None   Collection Time: 11/27/20 11:45 PM   Specimen: Nasopharyngeal Swab  Result Value Ref Range Status   SARS Coronavirus 2 NEGATIVE NEGATIVE Final     Comment: (NOTE) SARS-CoV-2 target nucleic acids are NOT DETECTED.  The SARS-CoV-2 RNA is generally detectable in upper and lower respiratory specimens during the acute phase of infection. Negative results do not preclude SARS-CoV-2 infection, do not rule out co-infections with other pathogens, and should not be used as the sole basis for treatment or other patient management decisions. Negative results must be combined with clinical observations, patient history, and epidemiological information. The expected result is Negative.  Fact Sheet for Patients: SugarRoll.be  Fact Sheet for Healthcare Providers: https://www.woods-mathews.com/  This test is not yet approved or cleared by the [place] FDA and  has been authorized for detection and/or diagnosis of SARS-CoV-2 by FDA under an Emergency Use Authorization (EUA). This EUA will remain  in effect (meaning this test can be used) for the duration of the COVID-19 declaration under Se ction 564(b)(1) of the Act,  21 U.S.C. section 360bbb-3(b)(1), unless the authorization is terminated or revoked sooner.  Performed at [facility], [place] [address] [place], [place] [address]   Surgical pcr screen     Status: Abnormal   Collection Time: 11/28/20 10:49 PM   Specimen: Nasal Mucosa; Nasal Swab  Result Value Ref Range Status   MRSA, PCR NEGATIVE NEGATIVE Final   Staphylococcus aureus POSITIVE (A) NEGATIVE Final    Comment: (NOTE) The Xpert SA Assay (FDA approved for NASAL specimens in patients 3 years of age and older), is one component of a comprehensive surveillance program. It is not intended to diagnose infection nor to guide or monitor treatment. Performed at [facility], [place] [address] [place], [place] [address]   Surgical pcr screen     Status: Abnormal   Collection Time: 11/29/20  7:57 AM   Specimen: Nasal Mucosa; Nasal Swab  Result Value Ref Range Status    MRSA, PCR NEGATIVE NEGATIVE Final   Staphylococcus aureus POSITIVE (A) NEGATIVE Final    Comment: (NOTE) The Xpert SA Assay (FDA approved for NASAL specimens in patients 36 years of age and older), is one component of a comprehensive surveillance program. It is not intended to diagnose infection nor to guide or monitor treatment. Performed at [facility], [place] [address], [place],  [address]          Radiology Studies: Pelvis Portable  Result Date: 11/29/2020 CLINICAL DATA:  Right hip replacement anterior approach EXAM: PORTABLE PELVIS 1-2 VIEWS COMPARISON:  11/28/2020 FINDINGS: Right hip replacement in satisfactory position alignment. Hemiarthroplasty prosthesis. No fracture or complication. IMPRESSION: Satisfactory right hip replacement Electronically Signed   By: Franchot Gallo M.D.   On: 11/29/2020 17:04        Scheduled Meds:  aspirin EC  325 mg Oral Q breakfast   calcium-vitamin D  1 tablet Oral Q breakfast   chlorhexidine  5 mL Mouth/Throat Daily   docusate sodium  100 mg Oral BID   loratadine  10 mg Oral QHS   memantine  28 mg Oral QHS   metoCLOPramide  5 mg Oral BID   pantoprazole  40 mg Oral Daily   potassium chloride  40 mEq Oral BID   senna  1 tablet Oral BID   Continuous Infusions:  sodium chloride 50 mL/hr at 11/30/20 1754   methocarbamol (ROBAXIN) IV       LOS: 3 days    Time spent: 30 minutes    Barb Merino, MD [facility] Pager [phone]

## 2020-12-02 LAB — GLUCOSE, CAPILLARY
Glucose-Capillary: 117 mg/dL — ABNORMAL HIGH (ref 70–99)
Glucose-Capillary: 199 mg/dL — ABNORMAL HIGH (ref 70–99)

## 2020-12-02 LAB — CBC
HCT: 23.9 % — ABNORMAL LOW (ref 36.0–46.0)
Hemoglobin: 7.7 g/dL — ABNORMAL LOW (ref 12.0–15.0)
MCH: 30.1 pg (ref 26.0–34.0)
MCHC: 32.2 g/dL (ref 30.0–36.0)
MCV: 93.4 fL (ref 80.0–100.0)
Platelets: 234 10*3/uL (ref 150–400)
RBC: 2.56 MIL/uL — ABNORMAL LOW (ref 3.87–5.11)
RDW: 16.2 % — ABNORMAL HIGH (ref 11.5–15.5)
WBC: 11.8 10*3/uL — ABNORMAL HIGH (ref 4.0–10.5)
nRBC: 0 % (ref 0.0–0.2)

## 2020-12-02 NOTE — TOC Transition Note (Signed)
Transition of Care [facility]) - CM/SW Discharge Note   Patient Details  Name: Grace Gilbert MRN: [id number] Date of Birth: 1933/01/09  Transition of Care [facility] CM/SW Contact:  Lennart Pall, LCSW Phone Number: 12/02/2020, 10:38 AM   Clinical Narrative:    Pt medically cleared for return to SNF at [facility] today.  Ins authorization received.  Pt's POA aware.  PTAR called at 1030 and RN to call report to [phone] ext [phone].  No further TOC needs.   Final next level of care: Skilled Nursing Facility Barriers to Discharge: Barriers Resolved   Patient Goals and CMS Choice        Discharge Placement   Existing PASRR number confirmed : 11/30/20          Patient chooses bed at: [facility] Patient to be transferred to facility by: [place] Name of family member notified: guardian, [name] Patient and family notified of of transfer: 12/01/20  Discharge Plan and Services                DME Arranged: N/A DME Agency: NA                  Social Determinants of Health ([place]) Interventions     Readmission Risk Interventions Readmission Risk Prevention Plan 09/22/2018  Home Care Screening Complete  Some recent data might be hidden

## 2020-12-02 NOTE — Plan of Care (Signed)
Discharge instructions given to Time at [facility].

## 2020-12-02 NOTE — Plan of Care (Signed)
?  Problem: Clinical Measurements: ?Goal: Will remain free from infection ?Outcome: Progressing ?  ?Problem: Nutrition: ?Goal: Adequate nutrition will be maintained ?Outcome: Progressing ?  ?Problem: Pain Managment: ?Goal: General experience of comfort will improve ?Outcome: Progressing ?  ?

## 2020-12-02 NOTE — Discharge Summary (Signed)
Physician Discharge Summary  Grace Gilbert [id number] DOB: [birth year] DOA: 11/27/2020  PCP: [name], MD  Admit date: 11/27/2020 Discharge date: 12/02/2020  Admitted From: Long-term nursing home Disposition: Skilled nursing facility  Recommendations for Outpatient Follow-up:  Schedule follow-up with orthopedics in 2 weeks. Please obtain BMP/CBC in one week All-time aspiration precautions.  Home Health: N/A Equipment/Devices: N/A  Discharge Condition: Fair CODE STATUS: DNR Diet recommendation: Dysphagia 2 diet, one-to-one assist.  All aspiration precautions  Discharge summary: 87 year old female with history of hypertension and hyperlipidemia, advanced dementia and minimally verbal, chronic aspiration and dysphagia presented by EMS with fall at a skilled nursing facility.  In the emergency room, hemodynamically stable.  CBC 15,000.  X-ray with right femoral neck fracture.     Assessment & plan of care:   Principal Problem:   Closed displaced fracture of right femoral neck (HCC) Active Problems:   Hypertension   Alzheimer's dementia ([place])   Normocytic anemia   Close traumatic fracture of the right femoral neck: Status post right hip hemiarthroplasty 8/31, Dr. Lyla Glassing Adequate pain medications.  Weightbearing as tolerated.  Aspirin for DVT prophylaxis.  Dose changed. Working with PT OT discharge back to nursing home for rehab and ultimately to long-term care.   Essential hypertension: Blood pressure stable.   Alzheimer's dementia: Long-term nursing home resident.  Stable.  Continue supportive care.   Acute hypoxemic respiratory failure: Patient noted to be on supplemental oxygen since admission.  Chest x-ray with evidence of prominent bronchovascular markings.  She does have history of chronic recurrent aspiration pneumonia, however currently less likely pneumonia. Repeat chest x-ray with left basilar atelectasis, no evidence of pneumonia. Weaned off to 2 L oxygen  today.  Discharged with oxygen, continue to gradually wean off.   Anemia of acute blood loss: Anticipated from long bone fractures and also from procedure.  Hemoglobin is 7.7  No evidence of active bleeding.  Please recheck in 1 week to ensure stabilization.   Patient is chronically sick with multiple medical issues and advanced debilitation, however she is a stable as she is going to a skilled level of care.   Discharge Diagnoses:  Principal Problem:   Closed displaced fracture of right femoral neck (Grace Gilbert) Active Problems:   Hypertension   Alzheimer's dementia ([name])   Normocytic anemia    Discharge Instructions  Discharge Instructions     Diet - low sodium heart healthy   Complete by: As directed    Dys2/thin Crush meds 1:1 assist Hx esophageal dysphagia - follow posted precautions please Thank you!   Increase activity slowly   Complete by: As directed    No wound care   Complete by: As directed    Reinforce dressing until follow up   No wound care   Complete by: As directed       Allergies as of 12/02/2020       Reactions   Aricept [donepezil Hcl] Diarrhea   Crestor [rosuvastatin Calcium]    Myalgia   Lipitor [atorvastatin Calcium]    Lovastatin    Myalgia   Micardis [telmisartan]    Fatigue   Sertraline Diarrhea   Welchol [colesevelam Hcl]    Constipation   Zetia [ezetimibe]    Myalgia   Zocor [simvastatin]    Myalgia        Medication List     STOP taking these medications    aspirin EC 81 MG tablet Replaced by: aspirin 81 MG chewable tablet  TAKE these medications    acetaminophen 325 MG tablet Commonly known as: TYLENOL Take 650 mg by mouth every 6 (six) hours as needed.   acetaminophen 500 MG tablet Commonly known as: TYLENOL Take 2 tablets (1,000 mg total) by mouth in the morning and at bedtime.   acetaminophen 500 MG tablet Commonly known as: TYLENOL Take 2 tablets (1,000 mg total) by mouth daily as needed.   aspirin 81 MG  chewable tablet Commonly known as: Aspirin Childrens Chew 1 tablet (81 mg total) by mouth 2 (two) times daily with a meal. Replaces: aspirin EC 81 MG tablet   Calcium 600-200 MG-UNIT tablet Take 1 tablet by mouth daily. In the morning 7-11 am   cetirizine 10 MG tablet Commonly known as: ZYRTEC Take 10 mg by mouth daily.   CHLORHEXIDINE GLUCONATE MT Use as directed 1 Dose in the mouth or throat daily. 1 tsp of solution to each teeth and gums with a toothbrush. Spit excess and do not rinse.   escitalopram 10 MG tablet Commonly known as: LEXAPRO Take 1 tablet (10 mg total) by mouth daily.   memantine 28 MG Cp24 24 hr capsule Commonly known as: NAMENDA XR TAKE ONE CAPSULE ONCE A DAY TO PRESERVE MEMORY.   metoCLOPramide 5 MG tablet Commonly known as: REGLAN Take 5 mg by mouth in the morning and at bedtime.   NON FORMULARY Med Pass 2.0 liquid; 2.0; amt: 180ML.; oral Special Instructions: Med Pass 2.0 180ML QD @ 2 PM. Doc. % consumed. Once a day   NON FORMULARY Med Pass 2.0 liquid; 2.0; amt: 180ML.; oral Special Instructions: Med Pass 2.0 180ML QD @ 2 PM. Doc. % consumed. Three times a day   omeprazole 20 MG capsule Commonly known as: PRILOSEC Take 20 mg by mouth daily. Do not crush; Open capsule and sprinkle on applesauce   PreserVision AREDS 2 Chew Chew 1 tablet by mouth in the morning and at bedtime.   PreviDent 5000 Booster Plus 1.1 % Pste Generic drug: Sodium Fluoride Place 1 application onto teeth every evening.   rivastigmine 9.5 mg/24hr Commonly known as: EXELON Apply fresh patch daily and remove old patch to help preserve memory   traMADol 50 MG tablet Commonly known as: Ultram Take 1 tablet (50 mg total) by mouth every 6 (six) hours as needed.   zinc oxide 20 % ointment Apply 1 application topically as needed for irritation. Apply to buttocks/peri topical as needed        Contact information for follow-up providers     Swinteck, Aaron Edelman, MD. Schedule  an appointment as soon as possible for a visit in 2 week(s).   Specialty: Orthopedic Surgery Why: For suture removal, For wound re-check Contact information: [address] [place] [address] [phone]              Contact information for after-discharge care     Destination     [facility] .   Service: Skilled Nursing Contact information: [address] [address] [phone]                    Allergies  Allergen Reactions   Aricept [Donepezil Hcl] Diarrhea   Crestor [Rosuvastatin Calcium]     Myalgia   Lipitor [Atorvastatin Calcium]    Lovastatin     Myalgia   Micardis [Telmisartan]     Fatigue   Sertraline Diarrhea   Welchol [Colesevelam Hcl]     Constipation  Zetia [Ezetimibe]     Myalgia   Zocor [Simvastatin]     Myalgia    Consultations: Orthopedics   Procedures/Studies: DG Chest 1 View  Result Date: 11/28/2020 CLINICAL DATA:  Status post fall. EXAM: CHEST  1 VIEW COMPARISON:  September 25, 2018 FINDINGS: Low lung volumes are seen with mild, diffuse, chronic appearing increased lung markings. Mild prominence of the pulmonary vasculature is noted. There is no evidence of an acute infiltrate, pleural effusion or pneumothorax. The heart size and mediastinal contours are within normal limits. There is moderate severity calcification of the aortic arch. Degenerative changes are seen throughout the thoracic spine. IMPRESSION: Chronic appearing increased lung markings with findings suggestive of mild pulmonary vascular congestion. Electronically Signed   By: Virgina Norfolk M.D.   On: 11/28/2020 00:27   Pelvis Portable  Result Date: 11/29/2020 CLINICAL DATA:  Right hip replacement anterior approach EXAM: PORTABLE PELVIS 1-2 VIEWS COMPARISON:  11/28/2020 FINDINGS: Right hip replacement in satisfactory position alignment. Hemiarthroplasty prosthesis. No fracture or complication.  IMPRESSION: Satisfactory right hip replacement Electronically Signed   By: Franchot Gallo M.D.   On: 11/29/2020 17:04   DG CHEST PORT 1 VIEW  Result Date: 12/01/2020 CLINICAL DATA:  Hypoxia. EXAM: PORTABLE CHEST 1 VIEW COMPARISON:  Radiographs 11/28/2020 and 09/25/2018.  CT 09/24/2018. FINDINGS: 1235 hours. The heart size and mediastinal contours are stable with aortic atherosclerosis. There is chronic lung disease with diffuse interstitial prominence and asymmetric left basilar scarring. Left basilar opacity may be slightly increased compared with the radiographs of 3 days ago. There is no significant recurrent pleural effusion. The bones appear unchanged. IMPRESSION: Left basilar opacity appears mildly increased, potentially reflecting atelectasis or developing infiltrate. Otherwise stable chronic interstitial prominence and pulmonary scarring. Electronically Signed   By: Richardean Sale M.D.   On: 12/01/2020 15:09   DG Knee Right Port  Result Date: 11/28/2020 CLINICAL DATA:  Fall. EXAM: PORTABLE RIGHT KNEE - 1-2 VIEW COMPARISON:  None. FINDINGS: Two-view exam is limited by positioning. Within this limitation there is no gross fracture or dislocation evident. Assessment for joint effusion is limited by positioning. IMPRESSION: Limited study without acute bony abnormality Electronically Signed   By: Misty Stanley M.D.   On: 11/28/2020 09:30   DG C-Arm 1-60 Min-No Report  Result Date: 11/29/2020 Fluoroscopy was utilized by the requesting physician.  No radiographic interpretation.   DG HIP OPERATIVE UNILAT W OR W/O PELVIS RIGHT  Result Date: 11/29/2020 CLINICAL DATA:  Hip arthroplasty EXAM: OPERATIVE RIGHT HIP (WITH PELVIS IF PERFORMED)  VIEWS TECHNIQUE: Fluoroscopic spot image(s) were submitted for interpretation post-operatively. COMPARISON:  None. FINDINGS: Intraoperative fluoroscopic views of the right hip demonstrate total arthroplasty. No obvious perihardware fracture or component  malpositioning. IMPRESSION: Intraoperative fluoroscopic views of the right hip demonstrate total arthroplasty. No obvious perihardware fracture or component malpositioning. Electronically Signed   By: Eddie Candle M.D.   On: 11/29/2020 14:00   DG Hip Unilat With Pelvis 2-3 Views Right  Result Date: 11/28/2020 CLINICAL DATA:  Status post fall. EXAM: DG HIP (WITH OR WITHOUT PELVIS) 2-3V RIGHT COMPARISON:  None. FINDINGS: Acute fracture deformity is seen extending through the neck of the proximal right femur. Approximately 1/2 shaft width inferior displacement of the distal fracture site is seen. There is no evidence of dislocation. Soft tissue structures are unremarkable. IMPRESSION: Acute fracture of the proximal right femur. Electronically Signed   By: Virgina Norfolk M.D.   On: 11/28/2020 00:26   (Echo, Carotid, EGD, Colonoscopy, ERCP)  Subjective: Patient seen and examined.  Mostly sleepy.  Apparently, she took her medications in the morning with the nurses and went back to sleep.  Noted no difficulty swallowing medications with applesauce.  Currently on 2 L oxygen.  She is unable to tell me whether she is hurting or not.   Discharge Exam: Vitals:   12/02/20 0508 12/02/20 0730  BP: 123/66   Pulse: 68   Resp: 16   Temp: 99.4 F (37.4 C)   SpO2: 93% 94%   Vitals:   12/01/20 1313 12/01/20 2046 12/02/20 0508 12/02/20 0730  BP: 138/66 114/62 123/66   Pulse: 68 78 68   Resp: '18 16 16   '$ Temp: 97.7 F (36.5 C) 98.9 F (37.2 C) 99.4 F (37.4 C)   TempSrc: Oral Oral Axillary   SpO2: 98% 97% 93% 94%  Weight:      Height:        General: Pt is frail and debilitated.  Mostly comfortable.  On 2 L oxygen today. Alert on stimulation but unable to stay awake on converse. She does not follow any commands. Cardiovascular: RRR, S1/S2 +, no rubs, no gallops Respiratory: CTA bilaterally, no wheezing, no rhonchi.  Mostly conducted upper airway sounds. Abdominal: Soft, NT, ND, bowel sounds +,  obese and pendulous. Extremities: no edema, no cyanosis Right lateral hip thigh incision clean and dry.  Nontender.    The results of significant diagnostics from this hospitalization (including imaging, microbiology, ancillary and laboratory) are listed below for reference.     Microbiology: Recent Results (from the past 240 hour(s))  SARS CORONAVIRUS 2 (TAT 6-24 HRS) Nasopharyngeal Nasopharyngeal Swab     Status: None   Collection Time: 11/27/20 11:45 PM   Specimen: Nasopharyngeal Swab  Result Value Ref Range Status   SARS Coronavirus 2 NEGATIVE NEGATIVE Final    Comment: (NOTE) SARS-CoV-2 target nucleic acids are NOT DETECTED.  The SARS-CoV-2 RNA is generally detectable in upper and lower respiratory specimens during the acute phase of infection. Negative results do not preclude SARS-CoV-2 infection, do not rule out co-infections with other pathogens, and should not be used as the sole basis for treatment or other patient management decisions. Negative results must be combined with clinical observations, patient history, and epidemiological information. The expected result is Negative.  Fact Sheet for Patients: SugarRoll.be  Fact Sheet for Healthcare Providers: https://www.woods-mathews.com/  This test is not yet approved or cleared by the [place] FDA and  has been authorized for detection and/or diagnosis of SARS-CoV-2 by FDA under an Emergency Use Authorization (EUA). This EUA will remain  in effect (meaning this test can be used) for the duration of the COVID-19 declaration under Se ction 564(b)(1) of the Act, 21 U.S.C. section 360bbb-3(b)(1), unless the authorization is terminated or revoked sooner.  Performed at [facility], [place] [address] [place], [place] [address]   Surgical pcr screen     Status: Abnormal   Collection Time: 11/28/20 10:49 PM   Specimen: Nasal Mucosa; Nasal Swab  Result Value Ref Range  Status   MRSA, PCR NEGATIVE NEGATIVE Final   Staphylococcus aureus POSITIVE (A) NEGATIVE Final    Comment: (NOTE) The Xpert SA Assay (FDA approved for NASAL specimens in patients 31 years of age and older), is one component of a comprehensive surveillance program. It is not intended to diagnose infection nor to guide or monitor treatment. Performed at [facility], [place] [address] [place], [place] [address]   Surgical pcr screen  Status: Abnormal   Collection Time: 11/29/20  7:57 AM   Specimen: Nasal Mucosa; Nasal Swab  Result Value Ref Range Status   MRSA, PCR NEGATIVE NEGATIVE Final   Staphylococcus aureus POSITIVE (A) NEGATIVE Final    Comment: (NOTE) The Xpert SA Assay (FDA approved for NASAL specimens in patients 5 years of age and older), is one component of a comprehensive surveillance program. It is not intended to diagnose infection nor to guide or monitor treatment. Performed at [facility], [place] [address], [place], [place] [address]   Resp Panel by RT-PCR (Flu A&B, Covid) Nasopharyngeal Swab     Status: None   Collection Time: 12/01/20  7:30 PM   Specimen: Nasopharyngeal Swab; Nasopharyngeal(NP) swabs in vial transport medium  Result Value Ref Range Status   SARS Coronavirus 2 by RT PCR NEGATIVE NEGATIVE Final    Comment: (NOTE) SARS-CoV-2 target nucleic acids are NOT DETECTED.  The SARS-CoV-2 RNA is generally detectable in upper respiratory specimens during the acute phase of infection. The lowest concentration of SARS-CoV-2 viral copies this assay can detect is 138 copies/mL. A negative result does not preclude SARS-Cov-2 infection and should not be used as the sole basis for treatment or other patient management decisions. A negative result may occur with  improper specimen collection/handling, submission of specimen other than nasopharyngeal swab, presence of viral mutation(s) within the areas targeted by this assay,  and inadequate number of viral copies(<138 copies/mL). A negative result must be combined with clinical observations, patient history, and epidemiological information. The expected result is Negative.  Fact Sheet for Patients:  EntrepreneurPulse.com.au  Fact Sheet for Healthcare Providers:  IncredibleEmployment.be  This test is no t yet approved or cleared by the [place] FDA and  has been authorized for detection and/or diagnosis of SARS-CoV-2 by FDA under an Emergency Use Authorization (EUA). This EUA will remain  in effect (meaning this test can be used) for the duration of the COVID-19 declaration under Section 564(b)(1) of the Act, 21 U.S.C.section 360bbb-3(b)(1), unless the authorization is terminated  or revoked sooner.       Influenza A by PCR NEGATIVE NEGATIVE Final   Influenza B by PCR NEGATIVE NEGATIVE Final    Comment: (NOTE) The Xpert Xpress SARS-CoV-2/FLU/RSV plus assay is intended as an aid in the diagnosis of influenza from Nasopharyngeal swab specimens and should not be used as a sole basis for treatment. Nasal washings and aspirates are unacceptable for Xpert Xpress SARS-CoV-2/FLU/RSV testing.  Fact Sheet for Patients: EntrepreneurPulse.com.au  Fact Sheet for Healthcare Providers: IncredibleEmployment.be  This test is not yet approved or cleared by the [place] FDA and has been authorized for detection and/or diagnosis of SARS-CoV-2 by FDA under an Emergency Use Authorization (EUA). This EUA will remain in effect (meaning this test can be used) for the duration of the COVID-19 declaration under Section 564(b)(1) of the Act, 21 U.S.C. section 360bbb-3(b)(1), unless the authorization is terminated or revoked.  Performed at [facility], [place] [address], [place], [place] [address]      Labs: BNP (last 3 results) No results for input(s): BNP in the last  8760 hours. Basic Metabolic Panel: Recent Labs  Lab 11/27/20 2340 11/28/20 0520 11/29/20 0435 11/30/20 0335 12/01/20 0320  NA 141 141 143 139 137  K 3.9 4.1 3.7 3.3* 4.1  CL 106 107 103 102 103  CO2 '27 29 30 28 27  '$ GLUCOSE 119* 122* 144* 153* 144*  BUN '12 12 22 '$ 25* 17  CREATININE 0.58 0.48 0.66 0.73 0.52  CALCIUM 9.0 8.8* 9.1 8.0* 7.8*  MG  --   --  2.1  --   --    Liver Function Tests: No results for input(s): AST, ALT, ALKPHOS, BILITOT, PROT, ALBUMIN in the last 168 hours. No results for input(s): LIPASE, AMYLASE in the last 168 hours. No results for input(s): AMMONIA in the last 168 hours. CBC: Recent Labs  Lab 11/27/20 2340 11/28/20 0520 11/29/20 0435 11/30/20 0335 12/01/20 0320 12/02/20 0304  WBC 15.0* 14.0* 20.6* 19.6* 14.0* 11.8*  NEUTROABS 11.1*  --  16.5*  --   --   --   HGB 11.8* 11.2* 12.2 9.3* 8.1* 7.7*  HCT 36.1 34.6* 38.0 28.3* 24.9* 23.9*  MCV 92.6 93.5 94.5 93.4 94.0 93.4  PLT 223 205 224 198 191 234   Cardiac Enzymes: No results for input(s): CKTOTAL, CKMB, CKMBINDEX, TROPONINI in the last 168 hours. BNP: Invalid input(s): POCBNP CBG: Recent Labs  Lab 12/01/20 0738 12/01/20 1130 12/01/20 1650 12/01/20 2047 12/02/20 0800  GLUCAP 108* 127* 129* 126* 117*   D-Dimer No results for input(s): DDIMER in the last 72 hours. Hgb A1c No results for input(s): HGBA1C in the last 72 hours. Lipid Profile No results for input(s): CHOL, HDL, LDLCALC, TRIG, CHOLHDL, LDLDIRECT in the last 72 hours. Thyroid function studies No results for input(s): TSH, T4TOTAL, T3FREE, THYROIDAB in the last 72 hours.  Invalid input(s): FREET3 Anemia work up No results for input(s): VITAMINB12, FOLATE, FERRITIN, TIBC, IRON, RETICCTPCT in the last 72 hours. Urinalysis    Component Value Date/Time   COLORURINE YELLOW 11/29/2020 2100   APPEARANCEUR CLEAR 11/29/2020 2100   LABSPEC >1.030 (H) 11/29/2020 2100   PHURINE 5.5 11/29/2020 2100   GLUCOSEU NEGATIVE 11/29/2020  2100   HGBUR TRACE (A) 11/29/2020 2100   BILIRUBINUR NEGATIVE 11/29/2020 2100   KETONESUR NEGATIVE 11/29/2020 2100   PROTEINUR TRACE (A) 11/29/2020 2100   UROBILINOGEN 0.2 05/15/2011 1358   NITRITE NEGATIVE 11/29/2020 2100   LEUKOCYTESUR TRACE (A) 11/29/2020 2100   Sepsis Labs Invalid input(s): PROCALCITONIN,  WBC,  LACTICIDVEN Microbiology Recent Results (from the past 240 hour(s))  SARS CORONAVIRUS 2 (TAT 6-24 HRS) Nasopharyngeal Nasopharyngeal Swab     Status: None   Collection Time: 11/27/20 11:45 PM   Specimen: Nasopharyngeal Swab  Result Value Ref Range Status   SARS Coronavirus 2 NEGATIVE NEGATIVE Final    Comment: (NOTE) SARS-CoV-2 target nucleic acids are NOT DETECTED.  The SARS-CoV-2 RNA is generally detectable in upper and lower respiratory specimens during the acute phase of infection. Negative results do not preclude SARS-CoV-2 infection, do not rule out co-infections with other pathogens, and should not be used as the sole basis for treatment or other patient management decisions. Negative results must be combined with clinical observations, patient history, and epidemiological information. The expected result is Negative.  Fact Sheet for Patients: SugarRoll.be  Fact Sheet for Healthcare Providers: https://www.woods-mathews.com/  This test is not yet approved or cleared by the [place] FDA and  has been authorized for detection and/or diagnosis of SARS-CoV-2 by FDA under an Emergency Use Authorization (EUA). This EUA will remain  in effect (meaning this test can be used) for the duration of the COVID-19 declaration under Se ction 564(b)(1) of the Act, 21 U.S.C. section 360bbb-3(b)(1), unless the authorization is terminated or revoked sooner.  Performed at [facility], [place] [address] [place], [place] [address]   Surgical pcr screen     Status: Abnormal  Collection Time: 11/28/20 10:49 PM   Specimen:  Nasal Mucosa; Nasal Swab  Result Value Ref Range Status   MRSA, PCR NEGATIVE NEGATIVE Final   Staphylococcus aureus POSITIVE (A) NEGATIVE Final    Comment: (NOTE) The Xpert SA Assay (FDA approved for NASAL specimens in patients 7 years of age and older), is one component of a comprehensive surveillance program. It is not intended to diagnose infection nor to guide or monitor treatment. Performed at [facility], [place] [address] [place], [place] [address]   Surgical pcr screen     Status: Abnormal   Collection Time: 11/29/20  7:57 AM   Specimen: Nasal Mucosa; Nasal Swab  Result Value Ref Range Status   MRSA, PCR NEGATIVE NEGATIVE Final   Staphylococcus aureus POSITIVE (A) NEGATIVE Final    Comment: (NOTE) The Xpert SA Assay (FDA approved for NASAL specimens in patients 19 years of age and older), is one component of a comprehensive surveillance program. It is not intended to diagnose infection nor to guide or monitor treatment. Performed at [facility], [place] [address], [place], [place] [address]   Resp Panel by RT-PCR (Flu A&B, Covid) Nasopharyngeal Swab     Status: None   Collection Time: 12/01/20  7:30 PM   Specimen: Nasopharyngeal Swab; Nasopharyngeal(NP) swabs in vial transport medium  Result Value Ref Range Status   SARS Coronavirus 2 by RT PCR NEGATIVE NEGATIVE Final    Comment: (NOTE) SARS-CoV-2 target nucleic acids are NOT DETECTED.  The SARS-CoV-2 RNA is generally detectable in upper respiratory specimens during the acute phase of infection. The lowest concentration of SARS-CoV-2 viral copies this assay can detect is 138 copies/mL. A negative result does not preclude SARS-Cov-2 infection and should not be used as the sole basis for treatment or other patient management decisions. A negative result may occur with  improper specimen collection/handling, submission of specimen other than nasopharyngeal swab, presence of viral  mutation(s) within the areas targeted by this assay, and inadequate number of viral copies(<138 copies/mL). A negative result must be combined with clinical observations, patient history, and epidemiological information. The expected result is Negative.  Fact Sheet for Patients:  EntrepreneurPulse.com.au  Fact Sheet for Healthcare Providers:  IncredibleEmployment.be  This test is no t yet approved or cleared by the [place] FDA and  has been authorized for detection and/or diagnosis of SARS-CoV-2 by FDA under an Emergency Use Authorization (EUA). This EUA will remain  in effect (meaning this test can be used) for the duration of the COVID-19 declaration under Section 564(b)(1) of the Act, 21 U.S.C.section 360bbb-3(b)(1), unless the authorization is terminated  or revoked sooner.       Influenza A by PCR NEGATIVE NEGATIVE Final   Influenza B by PCR NEGATIVE NEGATIVE Final    Comment: (NOTE) The Xpert Xpress SARS-CoV-2/FLU/RSV plus assay is intended as an aid in the diagnosis of influenza from Nasopharyngeal swab specimens and should not be used as a sole basis for treatment. Nasal washings and aspirates are unacceptable for Xpert Xpress SARS-CoV-2/FLU/RSV testing.  Fact Sheet for Patients: EntrepreneurPulse.com.au  Fact Sheet for Healthcare Providers: [employer]  This test is not yet approved or cleared by the [place] FDA and has been authorized for detection and/or diagnosis of SARS-CoV-2 by FDA under an Emergency Use Authorization (EUA). This EUA will remain in effect (meaning this test can be used) for the duration of the COVID-19 declaration under Section 564(b)(1) of the Act, 21 U.S.C. section 360bbb-3(b)(1), unless the  authorization is terminated or revoked.  Performed at [facility], [place] [address] [place], [place] [address]      Time coordinating  discharge:  35 minutes  SIGNED:   Barb Merino, MD  [facility] 12/02/2020, 9:43 AM

## 2020-12-04 NOTE — Anesthesia Postprocedure Evaluation (Signed)
Anesthesia Post Note  Patient: Grace Gilbert  Procedure(s) Performed: ANTERIOR APPROACH HEMI HIP ARTHROPLASTY (Right)     Patient location during evaluation: PACU Anesthesia Type: General Level of consciousness: sedated and patient cooperative Pain management: pain level controlled Vital Signs Assessment: post-procedure vital signs reviewed and stable Respiratory status: spontaneous breathing Cardiovascular status: stable Anesthetic complications: no   No notable events documented.  Last Vitals:  Vitals:   12/02/20 0730 12/02/20 1357  BP:  (!) 144/72  Pulse:  72  Resp:  16  Temp:    SpO2: 94% 96%    Last Pain:  Vitals:   12/02/20 0730  TempSrc:   PainSc: Asleep                 Nolon Nations

## 2020-12-05 ENCOUNTER — Encounter: Payer: Self-pay | Admitting: Internal Medicine

## 2020-12-05 ENCOUNTER — Non-Acute Institutional Stay (SKILLED_NURSING_FACILITY): Payer: Medicare PPO | Admitting: Internal Medicine

## 2020-12-05 DIAGNOSIS — R1319 Other dysphagia: Secondary | ICD-10-CM

## 2020-12-05 DIAGNOSIS — Z96641 Presence of right artificial hip joint: Secondary | ICD-10-CM | POA: Diagnosis not present

## 2020-12-05 DIAGNOSIS — K219 Gastro-esophageal reflux disease without esophagitis: Secondary | ICD-10-CM | POA: Diagnosis not present

## 2020-12-05 DIAGNOSIS — F0280 Dementia in other diseases classified elsewhere without behavioral disturbance: Secondary | ICD-10-CM

## 2020-12-05 DIAGNOSIS — G301 Alzheimer's disease with late onset: Secondary | ICD-10-CM

## 2020-12-05 DIAGNOSIS — D649 Anemia, unspecified: Secondary | ICD-10-CM

## 2020-12-05 DIAGNOSIS — E785 Hyperlipidemia, unspecified: Secondary | ICD-10-CM

## 2020-12-05 DIAGNOSIS — M810 Age-related osteoporosis without current pathological fracture: Secondary | ICD-10-CM

## 2020-12-05 DIAGNOSIS — F339 Major depressive disorder, recurrent, unspecified: Secondary | ICD-10-CM

## 2020-12-05 NOTE — Progress Notes (Signed)
Provider:  Veleta Miners MD Location:   [place] Room Number: 3 Place of Service:  SNF (31)  PCP: [name], MD Patient Care Team: [name], MD as PCP - General (Internal Medicine) Lindwood Coke, MD as Consulting Physician (Dermatology) Christophe Louis, MD as Consulting Physician (Obstetrics and Gynecology) Latanya Maudlin, MD as Consulting Physician (Orthopedic Surgery) Garlan Fair, MD as Consulting Physician (Gastroenterology) Barnett Abu., MD as Consulting Physician (Cardiology) Neldon Mc, Donnamarie Poag, MD as Consulting Physician (Allergy and Immunology) Rexene Alberts, MD (Inactive) as Consulting Physician (Cardiothoracic Surgery) Melida Quitter, MD as Consulting Physician (Otolaryngology) Ngetich, Nelda Bucks, NP as Nurse Practitioner (Family Medicine)  Extended Emergency Contact Information Primary Emergency Contact: Kathie Rhodes of [place] Phone: [phone] Mobile Phone: [phone] Relation: Legal Guardian Secondary Emergency Contact: Leana G[place] of [place] Phone: [phone] Relation: Uncle  Code Status: DNR Managed Care Goals of Care: Advanced Directive information Advanced Directives 12/05/2020  Does Patient Have a Medical Advance Directive? Yes  Type of Paramedic of [place];Out of facility DNR (pink MOST or yellow form)  Does patient want to make changes to medical advance directive? No - Patient declined  Copy of [place] in Chart? Yes - validated most recent copy scanned in chart (See row information)  Pre-existing out of facility DNR order (yellow form or pink MOST form) Yellow form placed in chart (order not valid for inpatient use);Pink MOST form placed in chart (order not valid for inpatient use)      Chief Complaint  Patient presents with   Re admit To SNF    Admission to SNF    HPI: Patient is a 87 y.o. female seen today  for Readmission to SNF  Was admitted in the hospital from 8/29-9/3 for Displaced Fracture of Right Femoral Neck after Mechanical fall  Patient has h/o Hyperlipidemia, Osteoporosis, Allergic Rhinitis and Dementia Alzheimer, Dysphagia  s/p Dilatation 6/20 Has h/o Choking Episodes on Reglan now.  She is Long term resident of SN[facility] when was standing near her Wheelchair Was send to ED Was found to have Right hip fracture Underwent Right hip Hemi arthroplasty on 8/31 She did have some Hypoxia after surgery Chest Xray was negative HGB dropped to 7.7 Post Op  Patient seems sleepy today Did have another Episode of Choking yesterday but back to her baseline this morning No cough or Fever   Past Medical History:  Diagnosis Date   Abnormal CT scan, chest September 2011   Scar tissue   Allergic rhinitis    Alzheimer's dementia ([place])    Asthma    Diabetes mellitus    borderline and under control-diet ,exercise   Esophageal stricture    Family history of colon cancer    Mother   GERD (gastroesophageal reflux disease)    Hiatal hernia    HLD (hyperlipidemia)    Hypertension    Osteopenia    Pneumonia 12-15 yrs.ago   yrs. ago   TIA (transient ischemic attack)    Vitamin D deficiency    Past Surgical History:  Procedure Laterality Date   ABDOMINAL HYSTERECTOMY  1980    ANTERIOR APPROACH HEMI HIP ARTHROPLASTY Right 11/29/2020   Procedure: ANTERIOR APPROACH HEMI HIP ARTHROPLASTY;  Surgeon: Rod Can, MD;  Location: [facility];  Service: Orthopedics;  Laterality: Right;   BALLOON DILATION N/A 09/27/2018   Procedure: BALLOON DILATION;  Surgeon: Jackquline Denmark, MD;  Location: [facility];  Service:  Endoscopy;  Laterality: N/A;   BIOPSY  09/27/2018   Procedure: BIOPSY;  Surgeon: Jackquline Denmark, MD;  Location: [facility];  Service: Endoscopy;;   CATARACT EXTRACTION Left 2014   ESOPHAGOGASTRODUODENOSCOPY (EGD) WITH PROPOFOL N/A 09/27/2018   Procedure: ESOPHAGOGASTRODUODENOSCOPY (EGD)  WITH PROPOFOL;  Surgeon: Jackquline Denmark, MD;  Location: [facility];  Service: Endoscopy;  Laterality: N/A;   SHOULDER OPEN ROTATOR CUFF REPAIR  05/22/2011   Procedure: ROTATOR CUFF REPAIR SHOULDER OPEN;  Surgeon: Tobi Bastos, MD;  Location: [facility];  Service: Orthopedics;  Laterality: Left;   THORACOTOMY / DECORTICATION PARIETAL PLEURA Right 204   empyema   TONSILLECTOMY      reports that she quit smoking about 33 years ago. Her smoking use included cigarettes. She has a 30.00 pack-year smoking history. She has never used smokeless tobacco. She reports that she does not drink alcohol and does not use drugs. Social History   Socioeconomic History   Marital status: Single    Spouse name: Not on file   Number of children: Not on file   Years of education: Not on file   Highest education level: Not on file  Occupational History   Occupation: retired Pharmacist, hospital  Tobacco Use   Smoking status: Former    Packs/day: 1.50    Years: 20.00    Pack years: 30.00    Types: Cigarettes    Quit date: 05/15/1987    Years since quitting: 33.5   Smokeless tobacco: Never  Vaping Use   Vaping Use: Never used  Substance and Sexual Activity   Alcohol use: No    Alcohol/week: 1.0 standard drink    Types: 1 Glasses of wine per week   Drug use: No   Sexual activity: Never  Other Topics Concern   Not on file  Social History Narrative   Lives at [facility] since 07/27/2014   Previous math Pharmacist, hospital.   Never married   Former smoker stopped 1989   Alcohol occasionally    POA niece Ronalee Belts, [place]   Social Determinants of Health   Financial Resource Strain: Not on file  Food Insecurity: Not on file  Transportation Needs: Not on file  Physical Activity: Not on file  Stress: Not on file  Social Connections: Not on file  Intimate Partner Violence: Not on file    Functional Status Survey:    History reviewed. No pertinent family history.  Health Maintenance  Topic Date Due    Zoster Vaccines- Shingrix (1 of 2) Never done   URINE MICROALBUMIN  02/11/2017   INFLUENZA VACCINE  10/30/2020   OPHTHALMOLOGY EXAM  03/31/2021 (Originally 12/06/1942)   HEMOGLOBIN A1C  01/27/2021   FOOT EXAM  08/15/2021   TETANUS/TDAP  12/16/2021   DEXA SCAN  Completed   COVID-19 Vaccine  Completed   PNA vac Low Risk Adult  Completed   HPV VACCINES  Aged Out    Allergies  Allergen Reactions   Aricept [Donepezil Hcl] Diarrhea   Crestor [Rosuvastatin Calcium]     Myalgia   Lipitor [Atorvastatin Calcium]    Lovastatin     Myalgia   Micardis [Telmisartan]     Fatigue   Sertraline Diarrhea   Welchol [Colesevelam Hcl]     Constipation   Zetia [Ezetimibe]     Myalgia   Zocor [Simvastatin]     Myalgia    Allergies as of 12/05/2020       Reactions   Aricept [donepezil Hcl] Diarrhea   Crestor [rosuvastatin Calcium]  Myalgia   Lipitor [atorvastatin Calcium]    Lovastatin    Myalgia   Micardis [telmisartan]    Fatigue   Sertraline Diarrhea   Welchol [colesevelam Hcl]    Constipation   Zetia [ezetimibe]    Myalgia   Zocor [simvastatin]    Myalgia        Medication List        Accurate as of December 05, 2020  9:40 AM. If you have any questions, ask your nurse or doctor.          acetaminophen 325 MG tablet Commonly known as: TYLENOL Take 650 mg by mouth every 6 (six) hours as needed.   acetaminophen 500 MG tablet Commonly known as: TYLENOL Take 2 tablets (1,000 mg total) by mouth in the morning and at bedtime.   acetaminophen 500 MG tablet Commonly known as: TYLENOL Take 2 tablets (1,000 mg total) by mouth daily as needed.   aspirin 81 MG chewable tablet Commonly known as: Aspirin Childrens Chew 1 tablet (81 mg total) by mouth 2 (two) times daily with a meal.   Calcium 600-200 MG-UNIT tablet Take 1 tablet by mouth daily. In the morning 7-11 am   cetirizine 10 MG tablet Commonly known as: ZYRTEC Take 10 mg by mouth daily.   CHLORHEXIDINE  GLUCONATE MT Use as directed 1 Dose in the mouth or throat daily. 1 tsp of solution to each teeth and gums with a toothbrush. Spit excess and do not rinse.   escitalopram 10 MG tablet Commonly known as: LEXAPRO Take 1 tablet (10 mg total) by mouth daily.   memantine 28 MG Cp24 24 hr capsule Commonly known as: NAMENDA XR TAKE ONE CAPSULE ONCE A DAY TO PRESERVE MEMORY.   metoCLOPramide 5 MG tablet Commonly known as: REGLAN Take 5 mg by mouth in the morning and at bedtime.   NON FORMULARY Med Pass 2.0 liquid; 2.0; amt: 180ML.; oral Special Instructions: Med Pass 2.0 180ML QD @ 2 PM. Doc. % consumed. Once a day   NON FORMULARY Med Pass 2.0 liquid; 2.0; amt: 180ML.; oral Special Instructions: Med Pass 2.0 180ML QD @ 2 PM. Doc. % consumed. Three times a day   omeprazole 20 MG capsule Commonly known as: PRILOSEC Take 20 mg by mouth daily. Do not crush; Open capsule and sprinkle on applesauce   PreserVision AREDS 2 Chew Chew 1 tablet by mouth in the morning and at bedtime.   PreviDent 5000 Booster Plus 1.1 % Pste Generic drug: Sodium Fluoride Place 1 application onto teeth every evening.   rivastigmine 9.5 mg/24hr Commonly known as: EXELON Apply fresh patch daily and remove old patch to help preserve memory   Robafen DM Cough Clear 10-100 MG/5ML liquid Generic drug: Dextromethorphan-guaiFENesin Take 5 mLs by mouth every 6 (six) hours as needed.   traMADol 50 MG tablet Commonly known as: Ultram Take 1 tablet (50 mg total) by mouth every 6 (six) hours as needed.   zinc oxide 20 % ointment Apply 1 application topically as needed for irritation. Apply to buttocks/peri topical as needed        Review of Systems  Unable to perform ROS: Dementia   Vitals:   12/05/20 0921  BP: (!) 115/58  Pulse: 69  Resp: 18  Temp: 98 F (36.7 C)  SpO2: 96%  Weight: 146 lb (66.2 kg)  Height: 5' (1.524 m)   Body mass index is 28.51 kg/m. Physical Exam Vitals reviewed.   Constitutional:      Appearance: Normal  appearance.  HENT:     Head: Normocephalic.     Nose: Nose normal.     Mouth/Throat:     Mouth: Mucous membranes are moist.     Pharynx: Oropharynx is clear.  Eyes:     Pupils: Pupils are equal, round, and reactive to light.  Cardiovascular:     Rate and Rhythm: Normal rate and regular rhythm.     Pulses: Normal pulses.  Pulmonary:     Effort: Pulmonary effort is normal.     Breath sounds: Normal breath sounds. No wheezing or rales.     Comments: Some rales in Right Lower side Abdominal:     General: Abdomen is flat. Bowel sounds are normal.     Palpations: Abdomen is soft.  Musculoskeletal:        General: No swelling.     Cervical back: Neck supple.  Skin:    General: Skin is warm.  Neurological:     General: No focal deficit present.     Mental Status: She is alert.     Comments: Alert and Responding Does have Aphasia Also Looks Sleepy  Psychiatric:        Mood and Affect: Mood normal.        Thought Content: Thought content normal.    Labs reviewed: Basic Metabolic Panel: Recent Labs    11/29/20 0435 11/30/20 0335 12/01/20 0320  NA 143 139 137  K 3.7 3.3* 4.1  CL 103 102 103  CO2 '30 28 27  '$ GLUCOSE 144* 153* 144*  BUN 22 25* 17  CREATININE 0.66 0.73 0.52  CALCIUM 9.1 8.0* 7.8*  MG 2.1  --   --    Liver Function Tests: Recent Labs    02/17/20 0000 04/13/20 0000 07/28/20 0000  AST '13 15 13  '$ ALT '12 12 12  '$ ALKPHOS 63 68 52  ALBUMIN 3.2* 4.0 3.6   No results for input(s): LIPASE, AMYLASE in the last 8760 hours. No results for input(s): AMMONIA in the last 8760 hours. CBC: Recent Labs    07/28/20 0000 11/27/20 2340 11/28/20 0520 11/29/20 0435 11/30/20 0335 12/01/20 0320 12/02/20 0304  WBC 6.6 15.0*   < > 20.6* 19.6* 14.0* 11.8*  NEUTROABS 3,109.00 11.1*  --  16.5*  --   --   --   HGB 11.2* 11.8*   < > 12.2 9.3* 8.1* 7.7*  HCT 34* 36.1   < > 38.0 28.3* 24.9* 23.9*  MCV  --  92.6   < > 94.5 93.4 94.0  93.4  PLT 251 223   < > 224 198 191 234   < > = values in this interval not displayed.   Cardiac Enzymes: No results for input(s): CKTOTAL, CKMB, CKMBINDEX, TROPONINI in the last 8760 hours. BNP: Invalid input(s): POCBNP Lab Results  Component Value Date   HGBA1C 5.5 07/28/2020   Lab Results  Component Value Date   TSH 2.73 07/28/2020   Lab Results  Component Value Date   VITAMINB12 440 01/08/2019   No results found for: FOLATE Lab Results  Component Value Date   FERRITIN 549 (H) 09/24/2018    Imaging and Procedures obtained prior to SNF admission: DG Chest 1 View  Result Date: 11/28/2020 CLINICAL DATA:  Status post fall. EXAM: CHEST  1 VIEW COMPARISON:  September 25, 2018 FINDINGS: Low lung volumes are seen with mild, diffuse, chronic appearing increased lung markings. Mild prominence of the pulmonary vasculature is noted. There is no evidence of an acute infiltrate, pleural  effusion or pneumothorax. The heart size and mediastinal contours are within normal limits. There is moderate severity calcification of the aortic arch. Degenerative changes are seen throughout the thoracic spine. IMPRESSION: Chronic appearing increased lung markings with findings suggestive of mild pulmonary vascular congestion. Electronically Signed   By: Virgina Norfolk M.D.   On: 11/28/2020 00:27   DG Knee Right Port  Result Date: 11/28/2020 CLINICAL DATA:  Fall. EXAM: PORTABLE RIGHT KNEE - 1-2 VIEW COMPARISON:  None. FINDINGS: Two-view exam is limited by positioning. Within this limitation there is no gross fracture or dislocation evident. Assessment for joint effusion is limited by positioning. IMPRESSION: Limited study without acute bony abnormality Electronically Signed   By: Misty Stanley M.D.   On: 11/28/2020 09:30   DG Hip Unilat With Pelvis 2-3 Views Right  Result Date: 11/28/2020 CLINICAL DATA:  Status post fall. EXAM: DG HIP (WITH OR WITHOUT PELVIS) 2-3V RIGHT COMPARISON:  None. FINDINGS: Acute  fracture deformity is seen extending through the neck of the proximal right femur. Approximately 1/2 shaft width inferior displacement of the distal fracture site is seen. There is no evidence of dislocation. Soft tissue structures are unremarkable. IMPRESSION: Acute fracture of the proximal right femur. Electronically Signed   By: Virgina Norfolk M.D.   On: 11/28/2020 00:26    Assessment/Plan S/P total right hip arthroplasty Pain Control on Tramdol and Tylenol WBAT Therapy started but is going to be hard due to her Cognition On Aspirin higher dose for  DVT Follow with Ortho Anemia, post op Will start on PO iron for 2 weeks Repeat CBC in 1 week  Late onset Alzheimer's dementia without behavioral disturbance ([place]) On Namenda and Exelon Continues to be dependent for her ADLS  Esophageal dysphagia Continue on Reglan Will work with Speech Continues to be high risk for Aspiration On D2 diet Gastroesophageal reflux disease, unspecified whether esophagitis present On Prilosec Cough Silent  aspiration Chest xray was negative Rales on right side Hold Xray Will Continue Due Nebs  Depression, recurrent ([place]) On Lexapro Age-related osteoporosis  Could not tolerate Fosmax     Family/ staff Communication:   Labs/tests ordered:

## 2020-12-11 LAB — COMPREHENSIVE METABOLIC PANEL
Albumin: 3.1 — AB (ref 3.5–5.0)
Calcium: 8.6 — AB (ref 8.7–10.7)
Globulin: 2.6

## 2020-12-11 LAB — BASIC METABOLIC PANEL
BUN: 17 (ref 4–21)
CO2: 34 — AB (ref 13–22)
Chloride: 102 (ref 99–108)
Creatinine: 0.6 (ref 0.5–1.1)
Glucose: 87
Potassium: 4.4 (ref 3.4–5.3)
Sodium: 141 (ref 137–147)

## 2020-12-11 LAB — CBC AND DIFFERENTIAL
HCT: 27 — AB (ref 36–46)
Hemoglobin: 8.5 — AB (ref 12.0–16.0)
Neutrophils Absolute: 8112
Platelets: 392 (ref 150–399)
WBC: 12

## 2020-12-11 LAB — HEPATIC FUNCTION PANEL
ALT: 10 (ref 7–35)
AST: 14 (ref 13–35)
Alkaline Phosphatase: 57 (ref 25–125)
Bilirubin, Total: 0.5

## 2020-12-11 LAB — CBC: RBC: 2.89 — AB (ref 3.87–5.11)

## 2021-01-01 ENCOUNTER — Encounter: Payer: Self-pay | Admitting: Orthopedic Surgery

## 2021-01-01 ENCOUNTER — Non-Acute Institutional Stay (SKILLED_NURSING_FACILITY): Payer: Medicare PPO | Admitting: Orthopedic Surgery

## 2021-01-01 DIAGNOSIS — Z96641 Presence of right artificial hip joint: Secondary | ICD-10-CM

## 2021-01-01 DIAGNOSIS — F0280 Dementia in other diseases classified elsewhere without behavioral disturbance: Secondary | ICD-10-CM

## 2021-01-01 DIAGNOSIS — D649 Anemia, unspecified: Secondary | ICD-10-CM

## 2021-01-01 DIAGNOSIS — G301 Alzheimer's disease with late onset: Secondary | ICD-10-CM | POA: Diagnosis not present

## 2021-01-01 DIAGNOSIS — R1319 Other dysphagia: Secondary | ICD-10-CM | POA: Diagnosis not present

## 2021-01-01 DIAGNOSIS — F339 Major depressive disorder, recurrent, unspecified: Secondary | ICD-10-CM

## 2021-01-01 DIAGNOSIS — K219 Gastro-esophageal reflux disease without esophagitis: Secondary | ICD-10-CM

## 2021-01-01 DIAGNOSIS — M810 Age-related osteoporosis without current pathological fracture: Secondary | ICD-10-CM

## 2021-01-01 NOTE — Progress Notes (Signed)
Location:  [place] Room Number: 3 Place of Service:  SNF [phone]) Provider:  Windell Moulding, AGNP-C  [name], MD  Patient Care Team: [name], MD as PCP - General (Internal Medicine) Lindwood Coke, MD as Consulting Physician (Dermatology) Christophe Louis, MD as Consulting Physician (Obstetrics and Gynecology) Latanya Maudlin, MD as Consulting Physician (Orthopedic Surgery) Garlan Fair, MD as Consulting Physician (Gastroenterology) Barnett Abu., MD as Consulting Physician (Cardiology) Neldon Mc Donnamarie Poag, MD as Consulting Physician (Allergy and Immunology) Rexene Alberts, MD (Inactive) as Consulting Physician (Cardiothoracic Surgery) Melida Quitter, MD as Consulting Physician (Otolaryngology) Ngetich, Nelda Bucks, NP as Nurse Practitioner (Family Medicine)  Extended Emergency Contact Information Primary Emergency Contact: Kathie Rhodes of [place] Phone: [phone] Mobile Phone: [phone] Relation: Legal Guardian Secondary Emergency Contact: Leana Gamer [place] of [place] Phone: [phone] Relation: Uncle  Code Status:  DNR Goals of care: Advanced Directive information Advanced Directives 12/05/2020  Does Patient Have a Medical Advance Directive? Yes  Type of Paramedic of [place];Out of facility DNR (pink MOST or yellow form)  Does patient want to make changes to medical advance directive? No - Patient declined  Copy of [place] in Chart? Yes - validated most recent copy scanned in chart (See row information)  Pre-existing out of facility DNR order (yellow form or pink MOST form) Yellow form placed in chart (order not valid for inpatient use);Pink MOST form placed in chart (order not valid for inpatient use)     Chief Complaint  Patient presents with   Medical Management of Chronic Issues    HPI:  Pt is a 87 y.o. female seen today for medical  management of chronic diseases.    She currently resides on the skilled nursing unit at [facility]. Past medical history includes: hypertension, dysphagia, GERD, Alzheimer's disease, osteoporosis, thrombocytosis, unsteady gait and frequent falls.   Right hip fracture- Hospitalized at [facility] for right hip fracture 08/29- 09/03. Right hemiarthroplasty by Dr. Lyla Glassing 08/31. Hypoxia after surgery. Hgb dropped to 7.7 postop. Hgb 09/12 8.5. Weaned off oxygen at this time. Incision healed. Tylenol for pain. Aspirin 81 mg bid till 10/17 for dvt prophylaxis. Ambulating with wheelchair at this time. Continues to work with PT/OT. Alzheimer's- no recent behavioral outbursts, follows commands, aphasia, poor safety awareness, namenda, Exelon patch.  Dysphagia- no recent aspirations, remains on pureed foods/nectar thick diet, Reglan daily GERD- hgb 8.5 12/11/2020, omeprazole daily Depression- lexapro daily Osteoporosis- DEXA 2108, remains on calcium , intolerance to fosamax  12/20/2020- received 3rd covid booster vaccine, tolerated well.   Recent blooc pressures:  09/27- 135/76  09/20- 121/66  09/14- 119/83  Recent weights:  09/28- 141.4 lbs  09/04- 146 lbs  08/01- 145 lbs     Past Medical History:  Diagnosis Date   Abnormal CT scan, chest September 2011   Scar tissue   Allergic rhinitis    Alzheimer's dementia ([place])    Asthma    Diabetes mellitus    borderline and under control-diet ,exercise   Esophageal stricture    Family history of colon cancer    Mother   GERD (gastroesophageal reflux disease)    Hiatal hernia    HLD (hyperlipidemia)    Hypertension    Osteopenia    Pneumonia 12-15 yrs.ago   yrs. ago   TIA (transient ischemic attack)    Vitamin D deficiency    Past Surgical History:  Procedure Laterality Date  ABDOMINAL HYSTERECTOMY  1980    ANTERIOR APPROACH HEMI HIP ARTHROPLASTY Right 11/29/2020   Procedure: ANTERIOR APPROACH HEMI HIP ARTHROPLASTY;  Surgeon:  Rod Can, MD;  [facility]: [facility];  Service: Orthopedics;  Laterality: Right;   BALLOON DILATION N/A 09/27/2018   Procedure: BALLOON DILATION;  Surgeon: Jackquline Denmark, MD;  [facility];  Service: Endoscopy;  Laterality: N/A;   BIOPSY  09/27/2018   Procedure: BIOPSY;  Surgeon: Jackquline Denmark, MD;  [facility];  Service: Endoscopy;;   CATARACT EXTRACTION Left 2014   ESOPHAGOGASTRODUODENOSCOPY (EGD) WITH PROPOFOL N/A 09/27/2018   Procedure: ESOPHAGOGASTRODUODENOSCOPY (EGD) WITH PROPOFOL;  Surgeon: Jackquline Denmark, MD;  [facility];  Service: Endoscopy;  Laterality: N/A;   SHOULDER OPEN ROTATOR CUFF REPAIR  05/22/2011   Procedure: ROTATOR CUFF REPAIR SHOULDER OPEN;  Surgeon: Tobi Bastos, MD;  [facility];  Service: Orthopedics;  Laterality: Left;   THORACOTOMY / DECORTICATION PARIETAL PLEURA Right 204   empyema   TONSILLECTOMY      Allergies  Allergen Reactions   Aricept [Donepezil Hcl] Diarrhea   Crestor [Rosuvastatin Calcium]     Myalgia   Lipitor [Atorvastatin Calcium]    Lovastatin     Myalgia   Micardis [Telmisartan]     Fatigue   Sertraline Diarrhea   Welchol [Colesevelam Hcl]     Constipation   Zetia [Ezetimibe]     Myalgia   Zocor [Simvastatin]     Myalgia    Outpatient Encounter Medications as of 01/01/2021  Medication Sig   acetaminophen (TYLENOL) 325 MG tablet Take 650 mg by mouth every 6 (six) hours as needed.   acetaminophen (TYLENOL) 500 MG tablet Take 2 tablets (1,000 mg total) by mouth in the morning and at bedtime.   acetaminophen (TYLENOL) 500 MG tablet Take 2 tablets (1,000 mg total) by mouth daily as needed.   aspirin (ASPIRIN CHILDRENS) 81 MG chewable tablet Chew 1 tablet (81 mg total) by mouth 2 (two) times daily with a meal.   Calcium 600-200 MG-UNIT tablet Take 1 tablet by mouth daily. In the morning 7-11 am   cetirizine (ZYRTEC) 10 MG tablet Take 10 mg by mouth daily.   CHLORHEXIDINE GLUCONATE MT Use as directed 1  Dose in the mouth or throat daily. 1 tsp of solution to each teeth and gums with a toothbrush. Spit excess and do not rinse.   Dextromethorphan-guaiFENesin (ROBAFEN DM COUGH CLEAR) 10-100 MG/5ML liquid Take 5 mLs by mouth every 6 (six) hours as needed.   escitalopram (LEXAPRO) 10 MG tablet Take 1 tablet (10 mg total) by mouth daily.   memantine (NAMENDA XR) 28 MG CP24 24 hr capsule TAKE ONE CAPSULE ONCE A DAY TO PRESERVE MEMORY.   metoCLOPramide (REGLAN) 5 MG tablet Take 5 mg by mouth in the morning and at bedtime.   Multiple Vitamins-Minerals (PRESERVISION AREDS 2) CHEW Chew 1 tablet by mouth in the morning and at bedtime.   NON FORMULARY Med Pass 2.0 liquid; 2.0; amt: 180ML.; oral Special Instructions: Med Pass 2.0 180ML QD @ 2 PM. Doc. % consumed. Once a day   NON FORMULARY Med Pass 2.0 liquid; 2.0; amt: 180ML.; oral Special Instructions: Med Pass 2.0 180ML QD @ 2 PM. Doc. % consumed. Three times a day   omeprazole (PRILOSEC) 20 MG capsule Take 20 mg by mouth daily. Do not crush; Open capsule and sprinkle on applesauce   rivastigmine (EXELON) 9.5 mg/24hr Apply fresh patch daily and remove old patch to help preserve memory  Sodium Fluoride (PREVIDENT 5000 BOOSTER PLUS) 1.1 % PSTE Place 1 application onto teeth every evening.   traMADol (ULTRAM) 50 MG tablet Take 1 tablet (50 mg total) by mouth every 6 (six) hours as needed.   zinc oxide 20 % ointment Apply 1 application topically as needed for irritation. Apply to buttocks/peri topical as needed   No facility-administered encounter medications on file as of 01/01/2021.    Review of Systems  Unable to perform ROS: Dementia   Immunization History  Administered Date(s) Administered   Influenza, High Dose Seasonal PF 01/14/2019, 01/04/2020   Influenza,inj,Quad PF,6+ Mos 12/29/2017   Influenza-Unspecified 12/01/2014, 01/11/2016, 01/04/2020   Moderna SARS-COV2 Booster Vaccination 09/06/2020   Moderna Sars-Covid-2 Vaccination 04/05/2019,  05/03/2019, 02/14/2020   Pneumococcal Conjugate-13 11/22/2013   Pneumococcal Polysaccharide-23 07/01/2006   Tdap 12/17/2011   Zoster, Live 12/25/2005   Pertinent  Health Maintenance Due  Topic Date Due   URINE MICROALBUMIN  02/11/2017   INFLUENZA VACCINE  10/30/2020   OPHTHALMOLOGY EXAM  03/31/2021 (Originally 12/06/1942)   HEMOGLOBIN A1C  01/27/2021   FOOT EXAM  08/15/2021   DEXA SCAN  Completed   Fall Risk  09/29/2020 06/17/2018 04/22/2018 01/22/2017 07/23/2016  Falls in the past year? 1 - 0 No No  Number falls in past yr: 1 - 0 - -  Injury with Fall? 0 - 0 - -  Risk for fall due to : History of fall(s);Impaired balance/gait;Impaired mobility;Impaired vision;Mental status change Impaired vision - - -  Follow up Falls evaluation completed;Education provided;Falls prevention discussed - - - -   Functional Status Survey:    Vitals:   01/01/21 1055  BP: 135/76  Pulse: 60  Resp: 17  Temp: (!) 97.1 F (36.2 C)  SpO2: 95%  Weight: 141 lb 6.4 oz (64.1 kg)  Height: 5' (1.524 m)   Body mass index is 27.62 kg/m. Physical Exam Vitals reviewed.  Constitutional:      General: She is not in acute distress. HENT:     Head: Normocephalic.     Right Ear: There is no impacted cerumen.     Left Ear: There is no impacted cerumen.     Nose: Nose normal.     Mouth/Throat:     Mouth: Mucous membranes are moist.  Eyes:     General:        Right eye: No discharge.        Left eye: No discharge.  Neck:     Vascular: No carotid bruit.  Cardiovascular:     Rate and Rhythm: Normal rate and regular rhythm.     Pulses: Normal pulses.     Heart sounds: Normal heart sounds. No murmur heard. Pulmonary:     Effort: Pulmonary effort is normal. No respiratory distress.     Breath sounds: Normal breath sounds. No wheezing.  Abdominal:     General: Bowel sounds are normal. There is no distension.     Palpations: Abdomen is soft.     Tenderness: There is no abdominal tenderness.  Musculoskeletal:      Cervical back: Normal range of motion.     Right lower leg: No edema.     Left lower leg: No edema.  Feet:     Right foot:     Skin integrity: Skin integrity normal.     Toenail Condition: Right toenails are normal.     Left foot:     Skin integrity: Skin integrity normal.     Toenail Condition: Left toenails are  normal.  Lymphadenopathy:     Cervical: No cervical adenopathy.  Skin:    General: Skin is warm and dry.     Capillary Refill: Capillary refill takes less than 2 seconds.     Comments: Right hip incision closed, CDI, surrounding skin intact.   Neurological:     General: No focal deficit present.     Mental Status: She is alert. Mental status is at baseline.     Motor: Weakness present.     Gait: Gait abnormal.     Comments: wheelchair  Psychiatric:        Mood and Affect: Mood normal. Affect is flat.        Behavior: Behavior normal.        Cognition and Memory: Memory is impaired.     Comments: aphasia    Labs reviewed: Recent Labs    11/29/20 0435 11/30/20 0335 12/01/20 0320  NA 143 139 137  K 3.7 3.3* 4.1  CL 103 102 103  CO2 30 28 27   GLUCOSE 144* 153* 144*  BUN 22 25* 17  CREATININE 0.66 0.73 0.52  CALCIUM 9.1 8.0* 7.8*  MG 2.1  --   --    Recent Labs    02/17/20 0000 04/13/20 0000 07/28/20 0000  AST 13 15 13   ALT 12 12 12   ALKPHOS 63 68 52  ALBUMIN 3.2* 4.0 3.6   Recent Labs    07/28/20 0000 11/27/20 2340 11/28/20 0520 11/29/20 0435 11/30/20 0335 12/01/20 0320 12/02/20 0304  WBC 6.6 15.0*   < > 20.6* 19.6* 14.0* 11.8*  NEUTROABS 3,109.00 11.1*  --  16.5*  --   --   --   HGB 11.2* 11.8*   < > 12.2 9.3* 8.1* 7.7*  HCT 34* 36.1   < > 38.0 28.3* 24.9* 23.9*  MCV  --  92.6   < > 94.5 93.4 94.0 93.4  PLT 251 223   < > 224 198 191 234   < > = values in this interval not displayed.   Lab Results  Component Value Date   TSH 2.73 07/28/2020   Lab Results  Component Value Date   HGBA1C 5.5 07/28/2020   Lab Results  Component  Value Date   CHOL 275 (H) 04/06/2018   HDL 52 04/06/2018   LDLCALC 190 (H) 04/06/2018   TRIG 161 (H) 04/06/2018   CHOLHDL 5.3 (H) 04/06/2018    Significant Diagnostic Results in last 30 days:  No results found.  Assessment/Plan 1. S/P total right hip arthroplasty - right hemiarthroplasty 08/31- Dr. Lyla Glassing - minimal pain, taking tylenol  - aspirin 81 mg bid till 10/17 - incision healed, CDI  2. Late onset Alzheimer's dementia without behavioral disturbance ([place]) - no recent behavioral outbursts - cont namenda - cont skilled nursing care  3. Esophageal dysphagia - no recent aspirations - cont pureed diet with nectar thick fluids  4. Gastroesophageal reflux disease, unspecified whether esophagitis present - stable with omeprazole daily  5. Depression, recurrent ([place]) - stable with lexapro  6. Age-related osteoporosis without current pathological fracture - cont calcium/ vit d supplement - could intolerance to fosamax in past  7. Anemia, unspecified type - hgb 8.5 12/11/2020 - given ferrous sulfate x 2 weeks - cbc/diff 01/15/2021    Family/ staff Communication: plan discussed with patient and nurse  Labs/tests ordered:  cbc/diff 01/15/2021

## 2021-01-15 LAB — CBC: RBC: 3.5 — AB (ref 3.87–5.11)

## 2021-01-15 LAB — CBC AND DIFFERENTIAL
HCT: 32 — AB (ref 36–46)
Hemoglobin: 10.5 — AB (ref 12.0–16.0)
Neutrophils Absolute: 3927
Platelets: 265 (ref 150–399)
WBC: 7

## 2021-01-30 ENCOUNTER — Encounter: Payer: Self-pay | Admitting: Nurse Practitioner

## 2021-01-30 ENCOUNTER — Non-Acute Institutional Stay (SKILLED_NURSING_FACILITY): Payer: Medicare PPO | Admitting: Nurse Practitioner

## 2021-01-30 DIAGNOSIS — D649 Anemia, unspecified: Secondary | ICD-10-CM | POA: Diagnosis not present

## 2021-01-30 NOTE — Assessment & Plan Note (Signed)
Post op, had 2 weeks of iron supplement, Hgb 10.5 01/15/21

## 2021-01-30 NOTE — Progress Notes (Signed)
Location:   SNF [place] Room Number: 3 Place of Service:  SNF (31) Provider: [facility] Stacie Knutzen NP  [name], MD  Patient Care Team: [name], MD as PCP - General (Internal Medicine) Lindwood Coke, MD as Consulting Physician (Dermatology) Christophe Louis, MD as Consulting Physician (Obstetrics and Gynecology) Latanya Maudlin, MD as Consulting Physician (Orthopedic Surgery) Garlan Fair, MD as Consulting Physician (Gastroenterology) Barnett Abu., MD as Consulting Physician (Cardiology) Neldon Mc Donnamarie Poag, MD as Consulting Physician (Allergy and Immunology) Rexene Alberts, MD (Inactive) as Consulting Physician (Cardiothoracic Surgery) Melida Quitter, MD as Consulting Physician (Otolaryngology) Ngetich, Nelda Bucks, NP as Nurse Practitioner (Family Medicine)  Extended Emergency Contact Information Primary Emergency Contact: Kathie Rhodes of [place] Phone: [phone] Mobile Phone: [phone] Relation: Legal Guardian Secondary Emergency Contact: Leana Gamer [place] of [place] Phone: [phone] Relation: Uncle  Code Status:  DNR Goals of care: Advanced Directive information Advanced Directives 12/05/2020  Does Patient Have a Medical Advance Directive? Yes  Type of Paramedic of [place];Out of facility DNR (pink MOST or yellow form)  Does patient want to make changes to medical advance directive? No - Patient declined  Copy of [place] in Chart? Yes - validated most recent copy scanned in chart (See row information)  Pre-existing out of facility DNR order (yellow form or pink MOST form) Yellow form placed in chart (order not valid for inpatient use);Pink MOST form placed in chart (order not valid for inpatient use)     Chief Complaint  Patient presents with   Medical Management of Chronic Issues    HPI:  Pt is a 87 y.o. female seen today for medical management of chronic  diseases.    OA s/p total R hip arthroplasty 8/31/2, w/c for mobility.   Frequent falls, contributory factors are  increased frailty and limited safety awareness.             Dysphagia, choking episodes in the past. Takes Reglan. Risk for aspiration. Pureed diet. Nectar thick fluid             Dementia, takes Memantine, Rivastigmine, ASA 81mg  qd, progressing, needs to be fed.              Her mood is stable, on Escitalopram 10mg  qd. TSH 2.73 07/28/20             GERD, stable, on Omeprazole, Reglan             Allergic rhinitis, takes Zyrtec 10mg  qd             Prediabetes diet controlled, Hgb 5.5 07/28/20. Normal urine micro albumin 11/24/19,  had eye exam 09/22/19        OP, Ca, Vit D, did not tolerate Fosamax.   Post Op anemia, off Fe, Hgb 10.5 01/15/21                 Past Medical History:  Diagnosis Date   Abnormal CT scan, chest September 2011   Scar tissue   Allergic rhinitis    Alzheimer's dementia ([place])    Asthma    Diabetes mellitus    borderline and under control-diet ,exercise   Esophageal stricture    Family history of colon cancer    Mother   GERD (gastroesophageal reflux disease)    Hiatal hernia    HLD (hyperlipidemia)    Hypertension    Osteopenia    Pneumonia 12-15 yrs.ago  yrs. ago   TIA (transient ischemic attack)    Vitamin D deficiency    Past Surgical History:  Procedure Laterality Date   ABDOMINAL HYSTERECTOMY  1980    ANTERIOR APPROACH HEMI HIP ARTHROPLASTY Right 11/29/2020   Procedure: ANTERIOR APPROACH HEMI HIP ARTHROPLASTY;  Surgeon: Rod Can, MD;  [facility];  Service: Orthopedics;  Laterality: Right;   BALLOON DILATION N/A 09/27/2018   Procedure: BALLOON DILATION;  Surgeon: Jackquline Denmark, MD;  [facility];  Service: Endoscopy;  Laterality: N/A;   BIOPSY  09/27/2018   Procedure: BIOPSY;  Surgeon: Jackquline Denmark, MD;  [facility];  Service: Endoscopy;;   CATARACT EXTRACTION Left 2014   ESOPHAGOGASTRODUODENOSCOPY  (EGD) WITH PROPOFOL N/A 09/27/2018   Procedure: ESOPHAGOGASTRODUODENOSCOPY (EGD) WITH PROPOFOL;  Surgeon: Jackquline Denmark, MD;  [facility];  Service: Endoscopy;  Laterality: N/A;   SHOULDER OPEN ROTATOR CUFF REPAIR  05/22/2011   Procedure: ROTATOR CUFF REPAIR SHOULDER OPEN;  Surgeon: Tobi Bastos, MD;  [facility];  Service: Orthopedics;  Laterality: Left;   THORACOTOMY / DECORTICATION PARIETAL PLEURA Right 204   empyema   TONSILLECTOMY      Allergies  Allergen Reactions   Aricept [Donepezil Hcl] Diarrhea   Crestor [Rosuvastatin Calcium]     Myalgia   Lipitor [Atorvastatin Calcium]    Lovastatin     Myalgia   Micardis [Telmisartan]     Fatigue   Sertraline Diarrhea   Welchol [Colesevelam Hcl]     Constipation   Zetia [Ezetimibe]     Myalgia   Zocor [Simvastatin]     Myalgia    Allergies as of 01/30/2021       Reactions   Aricept [donepezil Hcl] Diarrhea   Crestor [rosuvastatin Calcium]    Myalgia   Lipitor [atorvastatin Calcium]    Lovastatin    Myalgia   Micardis [telmisartan]    Fatigue   Sertraline Diarrhea   Welchol [colesevelam Hcl]    Constipation   Zetia [ezetimibe]    Myalgia   Zocor [simvastatin]    Myalgia        Medication List        Accurate as of January 30, 2021 11:59 PM. If you have any questions, ask your nurse or doctor.          acetaminophen 325 MG tablet Commonly known as: TYLENOL Take 650 mg by mouth every 6 (six) hours as needed.   acetaminophen 500 MG tablet Commonly known as: TYLENOL Take 2 tablets (1,000 mg total) by mouth in the morning and at bedtime.   acetaminophen 500 MG tablet Commonly known as: TYLENOL Take 2 tablets (1,000 mg total) by mouth daily as needed.   Calcium 600-200 MG-UNIT tablet Take 1 tablet by mouth daily. In the morning 7-11 am   cetirizine 10 MG tablet Commonly known as: ZYRTEC Take 10 mg by mouth daily.   CHLORHEXIDINE GLUCONATE MT Use as directed 1 Dose in the mouth or  throat daily. 1 tsp of solution to each teeth and gums with a toothbrush. Spit excess and do not rinse.   escitalopram 10 MG tablet Commonly known as: LEXAPRO Take 1 tablet (10 mg total) by mouth daily.   memantine 28 MG Cp24 24 hr capsule Commonly known as: NAMENDA XR TAKE ONE CAPSULE ONCE A DAY TO PRESERVE MEMORY.   metoCLOPramide 5 MG tablet Commonly known as: REGLAN Take 5 mg by mouth in the morning and at bedtime.   NON FORMULARY Med Pass 2.0 liquid;  2.0; amt: 180ML.; oral Special Instructions: Med Pass 2.0 180ML QD @ 2 PM. Doc. % consumed. Once a day   NON FORMULARY Med Pass 2.0 liquid; 2.0; amt: 180ML.; oral Special Instructions: Med Pass 2.0 180ML QD @ 2 PM. Doc. % consumed. Three times a day   omeprazole 20 MG capsule Commonly known as: PRILOSEC Take 20 mg by mouth daily. Do not crush; Open capsule and sprinkle on applesauce   PreserVision AREDS 2 Chew Chew 1 tablet by mouth in the morning and at bedtime.   PreviDent 5000 Booster Plus 1.1 % Pste Generic drug: Sodium Fluoride Place 1 application onto teeth every evening.   rivastigmine 9.5 mg/24hr Commonly known as: EXELON Apply fresh patch daily and remove old patch to help preserve memory   Robafen DM Cough Clear 10-100 MG/5ML liquid Generic drug: Dextromethorphan-guaiFENesin Take 5 mLs by mouth every 6 (six) hours as needed.   traMADol 50 MG tablet Commonly known as: Ultram Take 1 tablet (50 mg total) by mouth every 6 (six) hours as needed.   zinc oxide 20 % ointment Apply 1 application topically as needed for irritation. Apply to buttocks/peri topical as needed        Review of Systems  Immunization History  Administered Date(s) Administered   Influenza, High Dose Seasonal PF 01/14/2019, 01/04/2020   Influenza,inj,Quad PF,6+ Mos 12/29/2017   Influenza-Unspecified 12/01/2014, 01/11/2016, 01/04/2020   Moderna SARS-COV2 Booster Vaccination 09/06/2020   Moderna Sars-Covid-2 Vaccination 04/05/2019,  05/03/2019, 02/14/2020   Pneumococcal Conjugate-13 11/22/2013   Pneumococcal Polysaccharide-23 07/01/2006   Tdap 12/17/2011   Zoster, Live 12/25/2005   Pertinent  Health Maintenance Due  Topic Date Due   URINE MICROALBUMIN  02/11/2017   INFLUENZA VACCINE  10/30/2020   HEMOGLOBIN A1C  01/27/2021   OPHTHALMOLOGY EXAM  03/31/2021 (Originally 12/06/1942)   FOOT EXAM  08/15/2021   DEXA SCAN  Completed   Fall Risk 11/30/2020 12/01/2020 12/01/2020 12/01/2020 12/02/2020  Falls in the past year? - - - - -  Was there an injury with Fall? - - - - -  Fall Risk Category Calculator - - - - -  Fall Risk Category - - - - -  Patient Fall Risk Level High fall risk High fall risk High fall risk High fall risk High fall risk  Patient at Risk for Falls Due to - - - - -  Fall risk Follow up - - - - -   Functional Status Survey:    Vitals:   01/30/21 1346  BP: 118/65  Pulse: 70  Resp: 20  Temp: (!) 97.3 F (36.3 C)  SpO2: 96%   There is no height or weight on file to calculate BMI. Physical Exam  Labs reviewed: Recent Labs    11/29/20 0435 11/30/20 0335 12/01/20 0320  NA 143 139 137  K 3.7 3.3* 4.1  CL 103 102 103  CO2 30 28 27   GLUCOSE 144* 153* 144*  BUN 22 25* 17  CREATININE 0.66 0.73 0.52  CALCIUM 9.1 8.0* 7.8*  MG 2.1  --   --    Recent Labs    02/17/20 0000 04/13/20 0000 07/28/20 0000  AST 13 15 13   ALT 12 12 12   ALKPHOS 63 68 52  ALBUMIN 3.2* 4.0 3.6   Recent Labs    07/28/20 0000 11/27/20 2340 11/28/20 0520 11/29/20 0435 11/30/20 0335 12/01/20 0320 12/02/20 0304  WBC 6.6 15.0*   < > 20.6* 19.6* 14.0* 11.8*  NEUTROABS 3,109.00 11.1*  --  16.5*  --   --   --  HGB 11.2* 11.8*   < > 12.2 9.3* 8.1* 7.7*  HCT 34* 36.1   < > 38.0 28.3* 24.9* 23.9*  MCV  --  92.6   < > 94.5 93.4 94.0 93.4  PLT 251 223   < > 224 198 191 234   < > = values in this interval not displayed.   Lab Results  Component Value Date   TSH 2.73 07/28/2020   Lab Results  Component Value Date    HGBA1C 5.5 07/28/2020   Lab Results  Component Value Date   CHOL 275 (H) 04/06/2018   HDL 52 04/06/2018   LDLCALC 190 (H) 04/06/2018   TRIG 161 (H) 04/06/2018   CHOLHDL 5.3 (H) 04/06/2018    Significant Diagnostic Results in last 30 days:  No results found.  Assessment/Plan Normocytic anemia Post op, had 2 weeks of iron supplement, Hgb 10.5 01/15/21   Family/ staff Communication: plan of care reviewed with the patient and charge nurse.   Labs/tests ordered:  none  Time spend 25 minutes.

## 2021-03-01 ENCOUNTER — Non-Acute Institutional Stay (SKILLED_NURSING_FACILITY): Payer: Medicare PPO | Admitting: Internal Medicine

## 2021-03-01 ENCOUNTER — Encounter: Payer: Self-pay | Admitting: Internal Medicine

## 2021-03-01 DIAGNOSIS — F0280 Dementia in other diseases classified elsewhere without behavioral disturbance: Secondary | ICD-10-CM

## 2021-03-01 DIAGNOSIS — G301 Alzheimer's disease with late onset: Secondary | ICD-10-CM | POA: Diagnosis not present

## 2021-03-01 DIAGNOSIS — R1319 Other dysphagia: Secondary | ICD-10-CM

## 2021-03-01 DIAGNOSIS — K219 Gastro-esophageal reflux disease without esophagitis: Secondary | ICD-10-CM

## 2021-03-01 DIAGNOSIS — D649 Anemia, unspecified: Secondary | ICD-10-CM | POA: Diagnosis not present

## 2021-03-01 DIAGNOSIS — Z96641 Presence of right artificial hip joint: Secondary | ICD-10-CM

## 2021-03-01 DIAGNOSIS — M810 Age-related osteoporosis without current pathological fracture: Secondary | ICD-10-CM

## 2021-03-01 DIAGNOSIS — F339 Major depressive disorder, recurrent, unspecified: Secondary | ICD-10-CM

## 2021-03-01 NOTE — Progress Notes (Signed)
Location:   [facility] Room Number: [place] of Service:  SNF [phone]) Provider:  Veleta Miners MD   [name], MD  Patient Care Team: [name], MD as PCP - General (Internal Medicine) Lindwood Coke, MD as Consulting Physician (Dermatology) Christophe Louis, MD as Consulting Physician (Obstetrics and Gynecology) Latanya Maudlin, MD as Consulting Physician (Orthopedic Surgery) Garlan Fair, MD as Consulting Physician (Gastroenterology) Barnett Abu., MD as Consulting Physician (Cardiology) Neldon Mc, Donnamarie Poag, MD as Consulting Physician (Allergy and Immunology) Rexene Alberts, MD (Inactive) as Consulting Physician (Cardiothoracic Surgery) Melida Quitter, MD as Consulting Physician (Otolaryngology) Ngetich, Nelda Bucks, NP as Nurse Practitioner (Family Medicine)  Extended Emergency Contact Information Primary Emergency Contact: Kathie Rhodes of [place] Phone: [phone] Mobile Phone: [phone] Relation: Legal Guardian Secondary Emergency Contact: Leana G[place] of [place] Phone: [phone] Relation: Uncle  Code Status:  [facility] Care Goals of care: Advanced Directive information Advanced Directives 03/01/2021  Does Patient Have a Medical Advance Directive? Yes  Type of Advance Directive [facility]  Does patient want to make changes to medical advance directive? No - Patient declined  Copy of [place] in Chart? Yes - validated most recent copy scanned in chart (See row information)  Pre-existing out of facility DNR order (yellow form or pink MOST form) -     Chief Complaint  Patient presents with   Medical Management of Chronic Issues   Quality Metric Gaps    Zoster Vaccines- Shingrix (1 of 2)    URINE MICROALBUMIN (Yearly) HEMOGLOBIN A1C (Every 6 Months)  #6 COVID-19 Vaccine      HPI:  Pt is a 87 y.o. female seen today for medical management of  chronic diseases.    Patient has h/o Hyperlipidemia, Osteoporosis, Allergic Rhinitis and Dementia Alzheimer, Dysphagia  s/p Dilatation 6/20 Has h/o Choking Episodes on Reglan now. Was admitted in the hospital from 8/29-9/3 for Displaced Fracture of Right Femoral Neck after Mechanical fall  Since then she has not been walking . Needs helps with her ADLS and Feeding. Sleeps more and her Aphasia seems worse No Behavior Issues NO Falls   Wt Readings from Last 3 Encounters:  03/01/21 141 lb 1.6 oz (64 kg)  01/01/21 141 lb 6.4 oz (64.1 kg)  12/05/20 146 lb (66.2 kg)    Past Medical History:  Diagnosis Date   Abnormal CT scan, chest September 2011   Scar tissue   Allergic rhinitis    Alzheimer's dementia ([place])    Asthma    Diabetes mellitus    borderline and under control-diet ,exercise   Esophageal stricture    Family history of colon cancer    Mother   GERD (gastroesophageal reflux disease)    Hiatal hernia    HLD (hyperlipidemia)    Hypertension    Osteopenia    Pneumonia 12-15 yrs.ago   yrs. ago   TIA (transient ischemic attack)    Vitamin D deficiency    Past Surgical History:  Procedure Laterality Date   ABDOMINAL HYSTERECTOMY  1980    ANTERIOR APPROACH HEMI HIP ARTHROPLASTY Right 11/29/2020   Procedure: ANTERIOR APPROACH HEMI HIP ARTHROPLASTY;  Surgeon: Rod Can, MD;  Location: [facility];  Service: Orthopedics;  Laterality: Right;   BALLOON DILATION N/A 09/27/2018   Procedure: BALLOON DILATION;  Surgeon: Jackquline Denmark, MD;  Location: [facility];  Service: Endoscopy;  Laterality: N/A;   BIOPSY  09/27/2018   Procedure:  BIOPSY;  Surgeon: Jackquline Denmark, MD;  Location: [facility];  Service: Endoscopy;;   CATARACT EXTRACTION Left 2014   ESOPHAGOGASTRODUODENOSCOPY (EGD) WITH PROPOFOL N/A 09/27/2018   Procedure: ESOPHAGOGASTRODUODENOSCOPY (EGD) WITH PROPOFOL;  Surgeon: Jackquline Denmark, MD;  Location: [facility];  Service: Endoscopy;  Laterality: N/A;   SHOULDER OPEN  ROTATOR CUFF REPAIR  05/22/2011   Procedure: ROTATOR CUFF REPAIR SHOULDER OPEN;  Surgeon: Tobi Bastos, MD;  Location: [facility];  Service: Orthopedics;  Laterality: Left;   THORACOTOMY / DECORTICATION PARIETAL PLEURA Right 204   empyema   TONSILLECTOMY      Allergies  Allergen Reactions   Aricept [Donepezil Hcl] Diarrhea   Crestor [Rosuvastatin Calcium]     Myalgia   Lipitor [Atorvastatin Calcium]    Lovastatin     Myalgia   Micardis [Telmisartan]     Fatigue   Sertraline Diarrhea   Welchol [Colesevelam Hcl]     Constipation   Zetia [Ezetimibe]     Myalgia   Zocor [Simvastatin]     Myalgia    Allergies as of 03/01/2021       Reactions   Aricept [donepezil Hcl] Diarrhea   Crestor [rosuvastatin Calcium]    Myalgia   Lipitor [atorvastatin Calcium]    Lovastatin    Myalgia   Micardis [telmisartan]    Fatigue   Sertraline Diarrhea   Welchol [colesevelam Hcl]    Constipation   Zetia [ezetimibe]    Myalgia   Zocor [simvastatin]    Myalgia        Medication List        Accurate as of March 01, 2021 11:26 AM. If you have any questions, ask your nurse or doctor.          STOP taking these medications    Robafen DM Cough Clear 10-100 MG/5ML liquid Generic drug: Dextromethorphan-guaiFENesin Stopped by: [name], MD       TAKE these medications    acetaminophen 500 MG tablet Commonly known as: TYLENOL Take 2 tablets (1,000 mg total) by mouth in the morning and at bedtime. What changed: Another medication with the same name was removed. Continue taking this medication, and follow the directions you see here. Changed by: [name], MD   acetaminophen 500 MG tablet Commonly known as: TYLENOL Take 2 tablets (1,000 mg total) by mouth daily as needed. What changed: Another medication with the same name was removed. Continue taking this medication, and follow the directions you see here. Changed by: [name], MD   aspirin EC 81 MG  tablet Take 81 mg by mouth daily. Swallow whole.   Calcium 600-200 MG-UNIT tablet Take 1 tablet by mouth daily. In the morning 7-11 am   cetirizine 10 MG tablet Commonly known as: ZYRTEC Take 10 mg by mouth daily.   CHLORHEXIDINE GLUCONATE MT Use as directed 1 Dose in the mouth or throat daily. 1 tsp of solution to each teeth and gums with a toothbrush. Spit excess and do not rinse.   escitalopram 10 MG tablet Commonly known as: LEXAPRO Take 1 tablet (10 mg total) by mouth daily.   ipratropium-albuterol 0.5-2.5 (3) MG/3ML Soln Commonly known as: DUONEB Take 3 mLs by nebulization every 6 (six) hours as needed.   memantine 28 MG Cp24 24 hr capsule Commonly known as: NAMENDA XR TAKE ONE CAPSULE ONCE A DAY TO PRESERVE MEMORY.   metoCLOPramide 5 MG tablet Commonly known as: REGLAN Take 5 mg by mouth in the morning and at bedtime.  NON FORMULARY Med Pass 2.0 liquid; 2.0; amt: 180ML.; oral Special Instructions: Med Pass 2.0 180ML QD @ 2 PM. Doc. % consumed. Once a day   NON FORMULARY Med Pass 2.0 liquid; 2.0; amt: 180ML.; oral Special Instructions: Med Pass 2.0 180ML QD @ 2 PM. Doc. % consumed. Three times a day   omeprazole 20 MG capsule Commonly known as: PRILOSEC Take 20 mg by mouth daily. Do not crush; Open capsule and sprinkle on applesauce   PreserVision AREDS 2 Chew Chew 1 tablet by mouth in the morning and at bedtime.   PreviDent 5000 Booster Plus 1.1 % Pste Generic drug: Sodium Fluoride Place 1 application onto teeth every evening.   rivastigmine 9.5 mg/24hr Commonly known as: EXELON Apply fresh patch daily and remove old patch to help preserve memory   traMADol 50 MG tablet Commonly known as: Ultram Take 1 tablet (50 mg total) by mouth every 6 (six) hours as needed.   zinc oxide 20 % ointment Apply 1 application topically as needed for irritation. Apply to buttocks/peri topical as needed        Review of Systems  Unable to perform ROS: Dementia    Immunization History  Administered Date(s) Administered   Influenza, High Dose Seasonal PF 01/14/2019, 01/04/2020   Influenza,inj,Quad PF,6+ Mos 12/29/2017   Influenza-Unspecified 12/01/2014, 01/11/2016, 01/04/2020, 01/24/2021   Moderna SARS-COV2 Booster Vaccination 09/06/2020   Moderna Sars-Covid-2 Vaccination 04/05/2019, 05/03/2019, 02/14/2020   PFIZER(Purple Top)SARS-COV-2 Vaccination 12/20/2020   Pneumococcal Conjugate-13 11/22/2013   Pneumococcal Polysaccharide-23 07/01/2006   Tdap 12/17/2011   Zoster, Live 12/25/2005   Pertinent  Health Maintenance Due  Topic Date Due   URINE MICROALBUMIN  02/11/2017   HEMOGLOBIN A1C  01/27/2021   OPHTHALMOLOGY EXAM  03/31/2021 (Originally 12/06/1942)   FOOT EXAM  08/15/2021   INFLUENZA VACCINE  Completed   DEXA SCAN  Completed   Fall Risk 11/30/2020 12/01/2020 12/01/2020 12/01/2020 12/02/2020  Falls in the past year? - - - - -  Was there an injury with Fall? - - - - -  Fall Risk Category Calculator - - - - -  Fall Risk Category - - - - -  Patient Fall Risk Level High fall risk High fall risk High fall risk High fall risk High fall risk  Patient at Risk for Falls Due to - - - - -  Fall risk Follow up - - - - -   Functional Status Survey:    Vitals:   03/01/21 1058  BP: (!) 143/74  Pulse: 64  Resp: 20  Temp: (!) 96.6 F (35.9 C)  SpO2: 96%  Weight: 141 lb 1.6 oz (64 kg)  Height: 5' (1.524 m)   Body mass index is 27.56 kg/m. Physical Exam Vitals reviewed.  Constitutional:      Appearance: Normal appearance.  HENT:     Head: Normocephalic.     Nose: Nose normal.     Mouth/Throat:     Mouth: Mucous membranes are moist.     Pharynx: Oropharynx is clear.  Eyes:     Pupils: Pupils are equal, round, and reactive to light.  Cardiovascular:     Rate and Rhythm: Normal rate and regular rhythm.     Pulses: Normal pulses.     Heart sounds: Normal heart sounds. No murmur heard. Pulmonary:     Effort: Pulmonary effort is normal.      Breath sounds: Normal breath sounds.  Abdominal:     General: Abdomen is flat. Bowel sounds are normal.  Palpations: Abdomen is soft.  Musculoskeletal:        General: No swelling.     Cervical back: Neck supple.  Skin:    General: Skin is warm.  Neurological:     General: No focal deficit present.     Mental Status: She is alert.     Comments: Has aphasia Does not follow Commands now  Psychiatric:        Mood and Affect: Mood normal.        Thought Content: Thought content normal.    Labs reviewed: Recent Labs    11/29/20 0435 11/30/20 0335 12/01/20 0320 12/11/20 0000  NA 143 139 137 141  K 3.7 3.3* 4.1 4.4  CL 103 102 103 102  CO2 30 28 27  34*  GLUCOSE 144* 153* 144*  --   BUN 22 25* 17 17  CREATININE 0.66 0.73 0.52 0.6  CALCIUM 9.1 8.0* 7.8* 8.6*  MG 2.1  --   --   --    Recent Labs    04/13/20 0000 07/28/20 0000 12/11/20 0000  AST 15 13 14   ALT 12 12 10   ALKPHOS 68 52 57  ALBUMIN 4.0 3.6 3.1*   Recent Labs    11/29/20 0435 11/30/20 0335 12/01/20 0320 12/02/20 0304 12/11/20 0000 01/15/21 0000  WBC 20.6* 19.6* 14.0* 11.8* 12.0 7.0  NEUTROABS 16.5*  --   --   --  8,112.00 3,927.00  HGB 12.2 9.3* 8.1* 7.7* 8.5* 10.5*  HCT 38.0 28.3* 24.9* 23.9* 27* 32*  MCV 94.5 93.4 94.0 93.4  --   --   PLT 224 198 191 234 392 265   Lab Results  Component Value Date   TSH 2.73 07/28/2020   Lab Results  Component Value Date   HGBA1C 5.5 07/28/2020   Lab Results  Component Value Date   CHOL 275 (H) 04/06/2018   HDL 52 04/06/2018   LDLCALC 190 (H) 04/06/2018   TRIG 161 (H) 04/06/2018   CHOLHDL 5.3 (H) 04/06/2018    Significant Diagnostic Results in last 30 days:  No results found.  Assessment/Plan S/P total right hip arthroplasty Not Ambulatory anymore Tylenol for Pain Discontinue tramadol and Wonder guard  Anemia, unspecified type Hgb stable now  Late onset Alzheimer's dementia without behavioral disturbance (HCC) On Namenda and Exelon    Esophageal dysphagia Continue Reglan  GERD On Prilosec Depression, recurrent (HCC) Continue Lexapro Age-related osteoporosis without current pathological fracture Did not tolerate Fosamax   Family/ staff Communication:   Labs/tests ordered:

## 2021-03-17 ENCOUNTER — Emergency Department (HOSPITAL_COMMUNITY): Payer: Medicare PPO

## 2021-03-17 ENCOUNTER — Emergency Department (HOSPITAL_COMMUNITY)
Admission: EM | Admit: 2021-03-17 | Discharge: 2021-03-17 | Disposition: A | Discharge status: Home / Self Care | Payer: Medicare PPO | Attending: Emergency Medicine | Admitting: Emergency Medicine

## 2021-03-17 DIAGNOSIS — M25552 Pain in left hip: Secondary | ICD-10-CM | POA: Diagnosis not present

## 2021-03-17 DIAGNOSIS — W19XXXA Unspecified fall, initial encounter: Secondary | ICD-10-CM | POA: Insufficient documentation

## 2021-03-17 DIAGNOSIS — S51012A Laceration without foreign body of left elbow, initial encounter: Secondary | ICD-10-CM | POA: Diagnosis not present

## 2021-03-17 DIAGNOSIS — G309 Alzheimer's disease, unspecified: Secondary | ICD-10-CM | POA: Diagnosis not present

## 2021-03-17 DIAGNOSIS — Z7982 Long term (current) use of aspirin: Secondary | ICD-10-CM | POA: Insufficient documentation

## 2021-03-17 DIAGNOSIS — F02C0 Dementia in other diseases classified elsewhere, severe, without behavioral disturbance, psychotic disturbance, mood disturbance, and anxiety: Secondary | ICD-10-CM

## 2021-03-17 DIAGNOSIS — R001 Bradycardia, unspecified: Secondary | ICD-10-CM | POA: Insufficient documentation

## 2021-03-17 DIAGNOSIS — S50312A Abrasion of left elbow, initial encounter: Secondary | ICD-10-CM | POA: Diagnosis not present

## 2021-03-17 DIAGNOSIS — R296 Repeated falls: Secondary | ICD-10-CM

## 2021-03-17 DIAGNOSIS — F0390 Unspecified dementia without behavioral disturbance: Secondary | ICD-10-CM | POA: Insufficient documentation

## 2021-03-17 DIAGNOSIS — Z87891 Personal history of nicotine dependence: Secondary | ICD-10-CM | POA: Diagnosis not present

## 2021-03-17 DIAGNOSIS — M25551 Pain in right hip: Secondary | ICD-10-CM | POA: Insufficient documentation

## 2021-03-17 DIAGNOSIS — S40021A Contusion of right upper arm, initial encounter: Secondary | ICD-10-CM | POA: Diagnosis not present

## 2021-03-17 DIAGNOSIS — Z96641 Presence of right artificial hip joint: Secondary | ICD-10-CM | POA: Insufficient documentation

## 2021-03-17 DIAGNOSIS — S59902A Unspecified injury of left elbow, initial encounter: Secondary | ICD-10-CM | POA: Diagnosis present

## 2021-03-17 DIAGNOSIS — I10 Essential (primary) hypertension: Secondary | ICD-10-CM | POA: Diagnosis not present

## 2021-03-17 DIAGNOSIS — Z79899 Other long term (current) drug therapy: Secondary | ICD-10-CM | POA: Diagnosis not present

## 2021-03-17 NOTE — ED Notes (Signed)
Pt lying in bed, NAD. A/ox1. Per previous RN report, pt BIBA from a facility for fall. Pt cannot seem to form complete thoughts or answer questions. On physical assessment, pt does not have any gross deformities and does not express any pain on palpation from head to toe. Hips stable, -rotation or shortening to legs. +pmsc

## 2021-03-17 NOTE — ED Notes (Signed)
Pt DC with [facility] to transport home. NAD. Pt a/ox1 as previously. Report given to vernon at [facility].

## 2021-03-17 NOTE — ED Notes (Signed)
Pt transported back from XR

## 2021-03-17 NOTE — ED Notes (Signed)
[facility] called at 4:44 pm  by Pauline Good.

## 2021-03-17 NOTE — Discharge Instructions (Addendum)
We did x-rays to your shoulders, chest, and hips which showed no fractures or dislocations. There was also no loosening of your previous hip replacement, which is good! You can continue to alternate Tylenol and Ibuprofen for the pain as needed. Thank you for allowing [place] to care for you.

## 2021-03-17 NOTE — ED Notes (Signed)
Patient transported to X-ray 

## 2021-03-17 NOTE — ED Triage Notes (Signed)
Patient here from [facility] reporting fall on the 13th untreated. Facility requesting imaging to right side - shoulder, clavicle, ribs, and hip.

## 2021-03-17 NOTE — ED Provider Notes (Addendum)
[facility] DEPT Provider Note   CSN: [id number] Arrival date & time: 03/17/21  1412     History Chief Complaint  Patient presents with   Grace Gilbert    Grace Gilbert is a 87 y.o. female.  Grace Gilbert is a 87 y.o. female with PMHx of alzheimer's dementia, HTN, HLD, osteopenia, proximal right femur fracture (10/2020) s/p right hip arthroplasty, TIA, who presents to the ED from [facility] s/p unwitnessed mechanical fall on 12/13.  Level 5 caveat as patient is altered secondary to Alzheimer's dementia.  He was able to discuss with Katie at the facility who states that patient had a fall 4 days ago within the facility.  It was unwitnessed and she was found on the floor, face down.  States that the time she was able to move all extremities, they did note a skin tear to left lateral elbow.  There is uncertainty of whether or not she hit her head but she does not think that she did. Today, CNA noticed large bruise on upper right bicep, also a knot and some swelling. She has seemed more guarded of that side. Patient unable to state whether she is in pain or not and POA wanted evaluation. Baseline mental status: knows her name and can answer simple yes or no questions but it takes a lot of redirection. She is wheelchair bound, walks only with therapy for short periods of time. Since the fall, she has had seated therapy sessions. Grace Gilbert does not feel that she has been extra sleepy lately or that there have been any changes to her mentation. She receives scheduled Tylenol 1000 mg BID for pain and received a dose this morning.      Past Medical History:  Diagnosis Date   Abnormal CT scan, chest September 2011   Scar tissue   Allergic rhinitis    Alzheimer's dementia ([place])    Asthma    Diabetes mellitus    borderline and under control-diet ,exercise   Esophageal stricture    Family history of colon cancer    Mother   GERD (gastroesophageal reflux  disease)    Hiatal hernia    HLD (hyperlipidemia)    Hypertension    Osteopenia    Pneumonia 12-15 yrs.ago   yrs. ago   TIA (transient ischemic attack)    Vitamin D deficiency     Patient Active Problem List   Diagnosis Date Noted   Closed displaced fracture of right femoral neck ([place]) 11/28/2020   Frequent falls 08/15/2020   Aspiration pneumonia ([place]) 02/07/2020   Generalized weakness 02/07/2020   Fall 12/31/2019   Choking episode 11/19/2019   Choking 08/27/2019   Weight gain 06/22/2019   GERD (gastroesophageal reflux disease) 05/25/2019   Unsteady gait 01/06/2019   Depression, recurrent ([place]) 01/06/2019   Dysphagia    Pleural effusion on left    FTT (failure to thrive) in adult    Vascular dementia without behavioral disturbance (HCC)    Normocytic anemia    Thrombocytosis    HCAP (healthcare-associated pneumonia) 09/17/2018   Allergic rhinitis 06/02/2017   History of TIA (transient ischemic attack) 03/05/2017   History of rotator cuff tear 03/05/2017   Age-related osteoporosis without current pathological fracture 12/09/2016   Prediabetes 11/20/2016   Hypertension    Alzheimer's dementia ([place])    HLD (hyperlipidemia)    Esophageal stricture     Past Surgical History:  Procedure Laterality Date   [place]  ANTERIOR APPROACH HEMI HIP ARTHROPLASTY Right 11/29/2020   Procedure: ANTERIOR APPROACH HEMI HIP ARTHROPLASTY;  Surgeon: Rod Can, MD;  Location: [facility];  Service: Orthopedics;  Laterality: Right;   BALLOON DILATION N/A 09/27/2018   Procedure: BALLOON DILATION;  Surgeon: Jackquline Denmark, MD;  Location: [facility];  Service: Endoscopy;  Laterality: N/A;   BIOPSY  09/27/2018   Procedure: BIOPSY;  Surgeon: Jackquline Denmark, MD;  Location: [facility];  Service: Endoscopy;;   CATARACT EXTRACTION Left 2014   ESOPHAGOGASTRODUODENOSCOPY (EGD) WITH PROPOFOL N/A 09/27/2018   Procedure: ESOPHAGOGASTRODUODENOSCOPY (EGD) WITH PROPOFOL;  Surgeon:  Jackquline Denmark, MD;  Location: [facility];  Service: Endoscopy;  Laterality: N/A;   SHOULDER OPEN ROTATOR CUFF REPAIR  05/22/2011   Procedure: ROTATOR CUFF REPAIR SHOULDER OPEN;  Surgeon: Tobi Bastos, MD;  Location: [facility];  Service: Orthopedics;  Laterality: Left;   THORACOTOMY / DECORTICATION PARIETAL PLEURA Right 204   empyema   TONSILLECTOMY       OB History   No obstetric history on file.     No family history on file.  Social History   Tobacco Use   Smoking status: Former    Packs/day: 1.50    Years: 20.00    Pack years: 30.00    Types: Cigarettes    Quit date: 05/15/1987    Years since quitting: 33.8   Smokeless tobacco: Never  Vaping Use   Vaping Use: Never used  Substance Use Topics   Alcohol use: No    Alcohol/week: 1.0 standard drink    Types: 1 Glasses of wine per week   Drug use: No    Home Medications Prior to Admission medications   Medication Sig Start Date End Date Taking? Authorizing Provider  acetaminophen (TYLENOL) 500 MG tablet Take 2 tablets (1,000 mg total) by mouth in the morning and at bedtime. 11/27/20   Fargo, Amy E, NP  acetaminophen (TYLENOL) 500 MG tablet Take 2 tablets (1,000 mg total) by mouth daily as needed. 11/27/20   [name], NP  aspirin EC 81 MG tablet Take 81 mg by mouth daily. Swallow whole.    [provider]  Calcium 600-200 MG-UNIT tablet Take 1 tablet by mouth daily. In the morning 7-11 am    [provider]  cetirizine (ZYRTEC) 10 MG tablet Take 10 mg by mouth daily. 10/19/18   [provider]  CHLORHEXIDINE GLUCONATE MT Use as directed 1 Dose in the mouth or throat daily. 1 tsp of solution to each teeth and gums with a toothbrush. Spit excess and do not rinse.    [provider]  escitalopram (LEXAPRO) 10 MG tablet Take 1 tablet (10 mg total) by mouth daily. 06/04/19   [name], MD  ipratropium-albuterol (DUONEB) 0.5-2.5 (3) MG/3ML SOLN Take 3 mLs by nebulization every 6 (six)  hours as needed.    [provider]  memantine (NAMENDA XR) 28 MG CP24 24 hr capsule TAKE ONE CAPSULE ONCE A DAY TO PRESERVE MEMORY. 02/03/18   Ngetich, Dinah C, NP  metoCLOPramide (REGLAN) 5 MG tablet Take 5 mg by mouth in the morning and at bedtime.    [provider]  Multiple Vitamins-Minerals (PRESERVISION AREDS 2) CHEW Chew 1 tablet by mouth in the morning and at bedtime.    [provider]  NON FORMULARY Med Pass 2.0 liquid; 2.0; amt: 180ML.; oral Special Instructions: Med Pass 2.0 180ML QD @ 2 PM. Doc. % consumed. Once a day    [provider]  NON FORMULARY Med Pass 2.0 liquid; 2.0; amt: 180ML.; oral Special Instructions: Med Pass 2.0 180ML QD @ 2 PM. Doc. % consumed. Three times a day    [provider]  omeprazole (PRILOSEC) 20 MG capsule Take 20 mg by mouth daily. Do not crush; Open capsule and sprinkle on applesauce 11/09/18   [provider]  rivastigmine (EXELON) 9.5 mg/24hr Apply fresh patch daily and remove old patch to help preserve memory 10/07/17   Blanchie Serve, MD  Sodium Fluoride (PREVIDENT 5000 BOOSTER PLUS) 1.1 % PSTE Place 1 application onto teeth every evening.    [provider]  traMADol (ULTRAM) 50 MG tablet Take 1 tablet (50 mg total) by mouth every 6 (six) hours as needed. 11/30/20 11/30/21  Swinteck, Aaron Edelman, MD  zinc oxide 20 % ointment Apply 1 application topically as needed for irritation. Apply to buttocks/peri topical as needed    [provider]    Allergies    Aricept [donepezil hcl], Crestor [rosuvastatin calcium], Lipitor [atorvastatin calcium], Lovastatin, Micardis [telmisartan], Sertraline, Welchol [colesevelam hcl], Zetia [ezetimibe], and Zocor [simvastatin]  Review of Systems   Review of Systems  Reason unable to perform ROS: AMS 2/2 dementia.  Musculoskeletal:  Positive for arthralgias.  Skin:  Positive for wound.   Physical Exam Updated Vital Signs BP (!) 144/63    Pulse 61     Temp 97.9 F (36.6 C) (Oral)    Resp 16    SpO2 100%   Physical Exam Constitutional:      General: She is not in acute distress.    Appearance: She is not ill-appearing or toxic-appearing.     Comments: Elderly woman, alert and oriented to self only, very tangential speech   HENT:     Head: Normocephalic.  Cardiovascular:     Rate and Rhythm: Regular rhythm. Bradycardia present.  Abdominal:     General: There is no distension.     Palpations: Abdomen is soft. There is no mass.  Musculoskeletal:     Cervical back: Neck supple.     Comments: Able to passively raise b/l arms up to 45 degrees. Difficult to assess ROM given dementia. Reports some ttp of b/l shoulder joints on palpation. Reports some right clavicular pain. No obvious deformities. No edema to b/l shoulders. Able to raise left lower extremity up against gravity, unable to lift right lower extremity up against gravity. Internal and external rotation of hip without significant pain b/l. Reported pain on palpation to b/l hips on palpation.  Skin:    General: Skin is warm and dry.     Comments: Skin abrasion, approximately 2x1.5 cm to left lateral elbow. Large area of ecchymosis, about 10x7cm to right bicep, appears to be healing and has a linear abrasion on top of it (pictures below).  Neurological:     Comments: Oriented to self only. Demented. Not able to follow commands well.         ED Results / Procedures / Treatments   Labs (all labs ordered are listed, but only abnormal results are displayed) Labs Reviewed - No data to display  EKG None  Radiology DG Chest 1 View  Result Date: 03/17/2021 CLINICAL DATA:  Fall, continued pain EXAM: CHEST  1 VIEW COMPARISON:  Radiograph 12/01/2020, chest CT 09/24/2018 FINDINGS: Unchanged cardiomediastinal silhouette. Stable chronic interstitial prominence. Mild streaky left basilar opacities, likely subsegmental atelectasis. No large pleural effusion. No visible pneumothorax. No  acute osseous abnormality on single frontal view of the chest. IMPRESSION: Left basilar  subsegmental atelectasis. Stable chronic interstitial prominence and pulmonary scarring. Electronically Signed   By: Maurine Simmering M.D.   On: 03/17/2021 15:51   DG Shoulder Right  Result Date: 03/17/2021 CLINICAL DATA:  [place] on 03/13/2021. Persistent pain in multiple locations. EXAM: RIGHT SHOULDER - 2+ VIEW COMPARISON:  None. FINDINGS: No fracture or bone lesion. Glenohumeral joint normally aligned. Mild AC joint osteoarthritis. AC joint normally aligned. Significant narrowing of the subacromial space consistent with a chronic full-thickness rotator cuff tear. Skeletal structures are demineralized. Soft tissues are unremarkable. IMPRESSION: 1. No fracture or dislocation.  No acute finding. Electronically Signed   By: Lajean Manes M.D.   On: 03/17/2021 15:45   DG Shoulder Left  Result Date: 03/17/2021 CLINICAL DATA:  Fall on 03/13/2021. Persistent pain in multiple locations including both shoulders and hips. EXAM: LEFT SHOULDER - 2+ VIEW COMPARISON:  None. FINDINGS: No fracture or bone lesion. Glenohumeral joint normally spaced and aligned. AC joint normally spaced and aligned. Mild narrowing of the subacromial space suspicious for a chronic full-thickness rotator cuff tear. Skeletal structures are demineralized. Soft tissues are unremarkable. IMPRESSION: 1. No fracture or dislocation.  No acute finding. Electronically Signed   By: Lajean Manes M.D.   On: 03/17/2021 15:46   DG HIP UNILAT WITH PELVIS 2-3 VIEWS LEFT  Result Date: 03/17/2021 CLINICAL DATA:  Fall EXAM: DG HIP (WITH OR WITHOUT PELVIS) 2-3V LEFT COMPARISON:  None. FINDINGS: There is no evidence of acute left hip fracture. Partially visualized right hip arthroplasty. Mild left hip osteoarthritis. IMPRESSION: No evidence of left hip fracture. Electronically Signed   By: Maurine Simmering M.D.   On: 03/17/2021 15:54   DG HIP UNILAT WITH PELVIS 2-3 VIEWS  RIGHT  Result Date: 03/17/2021 CLINICAL DATA:  Fall on 03/13/2021. Persistent pain in multiple locations including both shoulders and hips. EXAM: DG HIP (WITH OR WITHOUT PELVIS) 2-3V RIGHT COMPARISON:  None. FINDINGS: No fracture or bone lesion. Right hip hemiarthroplasty is well seated and aligned. Left hip joint, SI joints and symphysis pubis are normally spaced and aligned. Soft tissues are unremarkable. IMPRESSION: 1. No fracture or dislocation. 2. No evidence of loosening of the right hip hemiarthroplasty. Electronically Signed   By: Lajean Manes M.D.   On: 03/17/2021 15:47    Procedures Procedures   Medications Ordered in ED Medications - No data to display  ED Course  I have reviewed the triage vital signs and the nursing notes.  Pertinent labs & imaging results that were available during my care of the patient were reviewed by me and considered in my medical decision making (see chart for details).   MDM Rules/Calculators/A&P                         SIARRA GILKERSON is a 87 y.o. female with PMHx of alzheimer's dementia, HTN, HLD, osteopenia who presents to the ED today after an unwitnessed mechanical fall on 12/13. Vitals here notable for HR 59, BP 148/69, saturating 100% on room air. History is very limited given her dementia. Discussed with living facility and will proceed with imaging to b/l shoulders, hips and chest.   She is DNR/DNI. Per report from facility that traveled with her here, request was for imaging and discharge back to facility. Reports "no admission". MOST form accompanying her notes IV fluids and antibiotics are okay if needed. Do not feel that she needs IVF or blood work at this time. Will plan to call and update  POA when results return.   1555 PM: Chest x-ray notable for left basilar subsegmental atelectasis. Right and left shoulder x-rays without acute fractures or dislocations. Right hip x-ray without loosening of the right hip hemiarthroplasty. Left hip x-ray  without acute abnormalities. Attempted to call POA to update with results but went to voicemail x2.   1610: Attempted to call POA again, both mobile and voicemail. Patient safe to d/c back to the facility, will continue to try and update POA as able.   1727: Was able to speak with Ms. Tamala Julian, patients HPOA. Updated her on the results. She is very grateful for no fractures or dislocations. Will proceed with d/c back to facility. Ms. Tamala Julian is amenable to plan.  Final Clinical Impression(s) / ED Diagnoses Final diagnoses:  Fall  Unwitnessed fall  Traumatic ecchymosis of right upper arm, initial encounter  Severe Alzheimer's dementia, unspecified timing of dementia onset, unspecified whether behavioral, psychotic, or mood disturbance or anxiety [facility])    Rx / DC Orders ED Discharge Orders     None        Sharion Settler, DO 03/17/21 1617    Sharion Settler, DO 03/17/21 1728    Carmin Muskrat, MD 03/17/21 2253

## 2021-03-19 ENCOUNTER — Encounter: Payer: Self-pay | Admitting: Orthopedic Surgery

## 2021-03-19 ENCOUNTER — Non-Acute Institutional Stay (SKILLED_NURSING_FACILITY): Payer: Medicare PPO | Admitting: Orthopedic Surgery

## 2021-03-19 DIAGNOSIS — K219 Gastro-esophageal reflux disease without esophagitis: Secondary | ICD-10-CM

## 2021-03-19 DIAGNOSIS — R296 Repeated falls: Secondary | ICD-10-CM

## 2021-03-19 DIAGNOSIS — S40021S Contusion of right upper arm, sequela: Secondary | ICD-10-CM | POA: Diagnosis not present

## 2021-03-19 DIAGNOSIS — F339 Major depressive disorder, recurrent, unspecified: Secondary | ICD-10-CM

## 2021-03-19 DIAGNOSIS — R1319 Other dysphagia: Secondary | ICD-10-CM | POA: Diagnosis not present

## 2021-03-19 DIAGNOSIS — F0280 Dementia in other diseases classified elsewhere without behavioral disturbance: Secondary | ICD-10-CM

## 2021-03-19 DIAGNOSIS — G301 Alzheimer's disease with late onset: Secondary | ICD-10-CM | POA: Diagnosis not present

## 2021-03-19 DIAGNOSIS — M810 Age-related osteoporosis without current pathological fracture: Secondary | ICD-10-CM

## 2021-03-19 NOTE — Progress Notes (Signed)
Location:   [facility] Room Number: 3-A Place of Service:  SNF [phone]) Provider:  Windell Moulding, NP    Patient Care Team: [name], MD as PCP - General (Internal Medicine) Lindwood Coke, MD as Consulting Physician (Dermatology) Christophe Louis, MD as Consulting Physician (Obstetrics and Gynecology) Latanya Maudlin, MD as Consulting Physician (Orthopedic Surgery) Garlan Fair, MD as Consulting Physician (Gastroenterology) Barnett Abu., MD as Consulting Physician (Cardiology) Neldon Mc Donnamarie Poag, MD as Consulting Physician (Allergy and Immunology) Rexene Alberts, MD (Inactive) as Consulting Physician (Cardiothoracic Surgery) Melida Quitter, MD as Consulting Physician (Otolaryngology) Ngetich, Nelda Bucks, NP as Nurse Practitioner (Family Medicine)  Extended Emergency Contact Information Primary Emergency Contact: Kathie Rhodes of [place] Phone: [phone] Mobile Phone: [phone] Relation: Legal Guardian Secondary Emergency Contact: Leana G[place] of [place] Phone: [phone] Relation: Uncle  Code Status:  DNR Goals of care: Advanced Directive information Advanced Directives 03/19/2021  Does Patient Have a Medical Advance Directive? Yes  Type of Paramedic of [place];Out of facility DNR (pink MOST or yellow form)  Does patient want to make changes to medical advance directive? No - Patient declined  Copy of [place] in Chart? Yes - validated most recent copy scanned in chart (See row information)  Pre-existing out of facility DNR order (yellow form or pink MOST form) -     Chief Complaint  Patient presents with   Acute Visit    ED Follow Up Arm Pain.     HPI:  Pt is a 87 y.o. female seen today for an acute visit for right arm pain.   She currently resides on the skilled nursing unit at [facility]. Past medical history includes: hypertension,  dysphagia, GERD, Alzheimer's disease, osteoporosis, thrombocytosis, unsteady gait and frequent falls.  12/13 she was found on the floor with wheelchair behind her. No apparent injury at the time, vitals and neuro checks stable. 12/17 nursing staff noticed she was guarding her right arm. She also had a large bluish/purple bruise to area. Nursing also reports increased sleepiness earlier that day. History of right hemiarthroplasty 08/29-09/03. Remains on tylenol for pain. Tramadol and wander guard recently discontinued.  She was taken to [facility] ED for further evaluation. Xray left and right shoulder negative for fracture or dislocation. Xray right hip negative for fracture or dislocation, right hemiarthroplasty intact. Xray left hip negative for fracture or dislocation. CXR noted some left basilar atelectasis and chronic pulmonary scarring. She was discharged to [facility].   Today, she is able to move her arm, does not appear to be in pain. She is not guarding arm. Bruising has improved. Appears tired, does not follow commands.   Alzheimer's- no recent behavioral outbursts, follows commands, aphasia, poor safety awareness, namenda, Exelon patch.  Dysphagia- no recent aspirations, remains on pureed foods/nectar thick diet, Reglan daily GERD- hgb 10.5 01/15/2021, omeprazole daily Depression- lexapro daily Osteoporosis- DEXA 2108, remains on calcium , intolerance to fosamax             Past Medical History:  Diagnosis Date   Abnormal CT scan, chest September 2011   Scar tissue   Allergic rhinitis    Alzheimer's dementia ([place])    Asthma    Diabetes mellitus    borderline and under control-diet ,exercise   Esophageal stricture    Family history of colon cancer    Mother   GERD (gastroesophageal reflux disease)  Hiatal hernia    HLD (hyperlipidemia)    Hypertension    Osteopenia    Pneumonia 12-15 yrs.ago   yrs. ago   TIA (transient ischemic attack)    Vitamin D  deficiency    Past Surgical History:  Procedure Laterality Date   ABDOMINAL HYSTERECTOMY  1980    ANTERIOR APPROACH HEMI HIP ARTHROPLASTY Right 11/29/2020   Procedure: ANTERIOR APPROACH HEMI HIP ARTHROPLASTY;  Surgeon: Rod Can, MD;  Location: [facility];  Service: Orthopedics;  Laterality: Right;   BALLOON DILATION N/A 09/27/2018   Procedure: BALLOON DILATION;  Surgeon: Jackquline Denmark, MD;  Location: [facility];  Service: Endoscopy;  Laterality: N/A;   BIOPSY  09/27/2018   Procedure: BIOPSY;  Surgeon: Jackquline Denmark, MD;  Location: [facility];  Service: Endoscopy;;   CATARACT EXTRACTION Left 2014   ESOPHAGOGASTRODUODENOSCOPY (EGD) WITH PROPOFOL N/A 09/27/2018   Procedure: ESOPHAGOGASTRODUODENOSCOPY (EGD) WITH PROPOFOL;  Surgeon: Jackquline Denmark, MD;  Location: [facility];  Service: Endoscopy;  Laterality: N/A;   SHOULDER OPEN ROTATOR CUFF REPAIR  05/22/2011   Procedure: ROTATOR CUFF REPAIR SHOULDER OPEN;  Surgeon: Tobi Bastos, MD;  Location: [facility];  Service: Orthopedics;  Laterality: Left;   THORACOTOMY / DECORTICATION PARIETAL PLEURA Right 204   empyema   TONSILLECTOMY      Allergies  Allergen Reactions   Aricept [Donepezil Hcl] Diarrhea   Crestor [Rosuvastatin Calcium]     Myalgia   Lipitor [Atorvastatin Calcium]    Lovastatin     Myalgia   Micardis [Telmisartan]     Fatigue   Sertraline Diarrhea   Welchol [Colesevelam Hcl]     Constipation   Zetia [Ezetimibe]     Myalgia   Zocor [Simvastatin]     Myalgia    Allergies as of 03/19/2021       Reactions   Aricept [donepezil Hcl] Diarrhea   Crestor [rosuvastatin Calcium]    Myalgia   Lipitor [atorvastatin Calcium]    Lovastatin    Myalgia   Micardis [telmisartan]    Fatigue   Sertraline Diarrhea   Welchol [colesevelam Hcl]    Constipation   Zetia [ezetimibe]    Myalgia   Zocor [simvastatin]    Myalgia        Medication List        Accurate as of March 19, 2021 10:21 AM. If you have any  questions, ask your nurse or doctor.          STOP taking these medications    traMADol 50 MG tablet Commonly known as: Ultram Stopped by: [name], NP       TAKE these medications    acetaminophen 500 MG tablet Commonly known as: TYLENOL Take 2 tablets (1,000 mg total) by mouth in the morning and at bedtime.   acetaminophen 500 MG tablet Commonly known as: TYLENOL Take 2 tablets (1,000 mg total) by mouth daily as needed.   aspirin EC 81 MG tablet Take 81 mg by mouth daily. Swallow whole.   Calcium 600-200 MG-UNIT tablet Take 1 tablet by mouth daily. In the morning 7-11 am   cetirizine 10 MG tablet Commonly known as: ZYRTEC Take 10 mg by mouth daily.   CHLORHEXIDINE GLUCONATE MT Use as directed 1 Dose in the mouth or throat daily. 1 tsp of solution to each teeth and gums with a toothbrush. Spit excess and do not rinse.   CHLORHEXIDINE GLUCONATE MT Use as directed 1 Dose in the mouth or throat 2 (two) times daily. 1 tsp  of solution to each teeth and gums with a toothbrush. Spit excess and do not rinse.   escitalopram 10 MG tablet Commonly known as: LEXAPRO Take 1 tablet (10 mg total) by mouth daily.   ipratropium-albuterol 0.5-2.5 (3) MG/3ML Soln Commonly known as: DUONEB Take 3 mLs by nebulization every 6 (six) hours as needed.   memantine 28 MG Cp24 24 hr capsule Commonly known as: NAMENDA XR TAKE ONE CAPSULE ONCE A DAY TO PRESERVE MEMORY.   metoCLOPramide 5 MG tablet Commonly known as: REGLAN Take 5 mg by mouth in the morning and at bedtime.   NON FORMULARY Med Pass 2.0 liquid; 2.0; amt: 180ML.; oral Special Instructions: Med Pass 2.0 180ML QD @ 2 PM. Doc. % consumed. Once a day   omeprazole 20 MG capsule Commonly known as: PRILOSEC Take 20 mg by mouth daily. Do not crush; Open capsule and sprinkle on applesauce   PreserVision AREDS 2 Chew Chew 1 tablet by mouth in the morning and at bedtime.   PreviDent 5000 Booster Plus 1.1 % Pste Generic  drug: Sodium Fluoride Place 1 application onto teeth every evening.   rivastigmine 9.5 mg/24hr Commonly known as: EXELON Apply fresh patch daily and remove old patch to help preserve memory   zinc oxide 20 % ointment Apply 1 application topically as needed for irritation. Apply to buttocks/peri topical as needed        Review of Systems  Unable to perform ROS: Dementia   Immunization History  Administered Date(s) Administered   Influenza, High Dose Seasonal PF 01/14/2019, 01/04/2020   Influenza,inj,Quad PF,6+ Mos 12/29/2017   Influenza-Unspecified 12/01/2014, 01/11/2016, 01/04/2020, 01/24/2021   Moderna SARS-COV2 Booster Vaccination 09/06/2020   Moderna Sars-Covid-2 Vaccination 04/05/2019, 05/03/2019, 02/14/2020   PFIZER(Purple Top)SARS-COV-2 Vaccination 12/20/2020   Pneumococcal Conjugate-13 11/22/2013   Pneumococcal Polysaccharide-23 07/01/2006   Tdap 12/17/2011   Zoster, Live 12/25/2005   Pertinent  Health Maintenance Due  Topic Date Due   URINE MICROALBUMIN  02/11/2017   HEMOGLOBIN A1C  01/27/2021   OPHTHALMOLOGY EXAM  03/31/2021 (Originally 12/06/1942)   FOOT EXAM  08/15/2021   INFLUENZA VACCINE  Completed   DEXA SCAN  Completed   Fall Risk 12/01/2020 12/01/2020 12/01/2020 12/02/2020 03/17/2021  Falls in the past year? - - - - -  Was there an injury with Fall? - - - - -  Fall Risk Category Calculator - - - - -  Fall Risk Category - - - - -  Patient Fall Risk Level High fall risk High fall risk High fall risk High fall risk High fall risk  Patient at Risk for Falls Due to - - - - -  Fall risk Follow up - - - - -   Functional Status Survey:    Vitals:   03/19/21 1010  BP: (!) 148/77  Pulse: 67  Resp: 18  Temp: (!) 96.4 F (35.8 C)  SpO2: 96%  Weight: 138 lb 11.2 oz (62.9 kg)  Height: 5' (1.524 m)   Body mass index is 27.09 kg/m. Physical Exam Vitals reviewed.  Constitutional:      General: She is not in acute distress. HENT:     Head: Normocephalic.   Eyes:     General:        Right eye: No discharge.        Left eye: No discharge.  Neck:     Vascular: No carotid bruit.  Cardiovascular:     Rate and Rhythm: Normal rate and regular rhythm.  Pulses: Normal pulses.     Heart sounds: Normal heart sounds. No murmur heard. Pulmonary:     Effort: Pulmonary effort is normal. No respiratory distress.     Breath sounds: Normal breath sounds. No wheezing.  Abdominal:     General: Bowel sounds are normal. There is no distension.     Palpations: Abdomen is soft.     Tenderness: There is no abdominal tenderness.  Musculoskeletal:     Right shoulder: Normal. Normal range of motion.     Left shoulder: Normal. Normal range of motion.     Right upper arm: No swelling or tenderness.     Left upper arm: No swelling or tenderness.     Right elbow: Normal. Normal range of motion.     Left elbow: Normal. Normal range of motion.     Cervical back: Normal range of motion.     Right lower leg: No edema.     Left lower leg: No edema.  Lymphadenopathy:     Cervical: No cervical adenopathy.  Skin:    General: Skin is warm and dry.     Capillary Refill: Capillary refill takes less than 2 seconds.     Findings: Bruising and lesion present.     Comments: Bruising to right upper arm improved, skin appears yellow/bluish color, no skin breakdown or swelling. Left elbow with small skin tear, CDI, no drainage, surrounding skin intact.   Neurological:     General: No focal deficit present.     Mental Status: She is alert. Mental status is at baseline.     Motor: Weakness present.     Gait: Gait abnormal.  Psychiatric:        Mood and Affect: Mood normal.        Speech: She is noncommunicative.        Behavior: Behavior normal.        Cognition and Memory: Memory is impaired.    Labs reviewed: Recent Labs    11/29/20 0435 11/30/20 0335 12/01/20 0320 12/11/20 0000  NA 143 139 137 141  K 3.7 3.3* 4.1 4.4  CL 103 102 103 102  CO2 30 28 27  34*   GLUCOSE 144* 153* 144*  --   BUN 22 25* 17 17  CREATININE 0.66 0.73 0.52 0.6  CALCIUM 9.1 8.0* 7.8* 8.6*  MG 2.1  --   --   --    Recent Labs    04/13/20 0000 07/28/20 0000 12/11/20 0000  AST 15 13 14   ALT 12 12 10   ALKPHOS 68 52 57  ALBUMIN 4.0 3.6 3.1*   Recent Labs    11/29/20 0435 11/30/20 0335 12/01/20 0320 12/02/20 0304 12/11/20 0000 01/15/21 0000  WBC 20.6* 19.6* 14.0* 11.8* 12.0 7.0  NEUTROABS 16.5*  --   --   --  8,112.00 3,927.00  HGB 12.2 9.3* 8.1* 7.7* 8.5* 10.5*  HCT 38.0 28.3* 24.9* 23.9* 27* 32*  MCV 94.5 93.4 94.0 93.4  --   --   PLT 224 198 191 234 392 265   Lab Results  Component Value Date   TSH 2.73 07/28/2020   Lab Results  Component Value Date   HGBA1C 5.5 07/28/2020   Lab Results  Component Value Date   CHOL 275 (H) 04/06/2018   HDL 52 04/06/2018   LDLCALC 190 (H) 04/06/2018   TRIG 161 (H) 04/06/2018   CHOLHDL 5.3 (H) 04/06/2018    Significant Diagnostic Results in last 30 days:  DG Chest 1 View  Result Date: 03/17/2021 CLINICAL DATA:  Fall, continued pain EXAM: CHEST  1 VIEW COMPARISON:  Radiograph 12/01/2020, chest CT 09/24/2018 FINDINGS: Unchanged cardiomediastinal silhouette. Stable chronic interstitial prominence. Mild streaky left basilar opacities, likely subsegmental atelectasis. No large pleural effusion. No visible pneumothorax. No acute osseous abnormality on single frontal view of the chest. IMPRESSION: Left basilar subsegmental atelectasis. Stable chronic interstitial prominence and pulmonary scarring. Electronically Signed   By: Maurine Simmering M.D.   On: 03/17/2021 15:51   DG Shoulder Right  Result Date: 03/17/2021 CLINICAL DATA:  [place] on 03/13/2021. Persistent pain in multiple locations. EXAM: RIGHT SHOULDER - 2+ VIEW COMPARISON:  None. FINDINGS: No fracture or bone lesion. Glenohumeral joint normally aligned. Mild AC joint osteoarthritis. AC joint normally aligned. Significant narrowing of the subacromial space consistent  with a chronic full-thickness rotator cuff tear. Skeletal structures are demineralized. Soft tissues are unremarkable. IMPRESSION: 1. No fracture or dislocation.  No acute finding. Electronically Signed   By: Lajean Manes M.D.   On: 03/17/2021 15:45   DG Shoulder Left  Result Date: 03/17/2021 CLINICAL DATA:  Fall on 03/13/2021. Persistent pain in multiple locations including both shoulders and hips. EXAM: LEFT SHOULDER - 2+ VIEW COMPARISON:  None. FINDINGS: No fracture or bone lesion. Glenohumeral joint normally spaced and aligned. AC joint normally spaced and aligned. Mild narrowing of the subacromial space suspicious for a chronic full-thickness rotator cuff tear. Skeletal structures are demineralized. Soft tissues are unremarkable. IMPRESSION: 1. No fracture or dislocation.  No acute finding. Electronically Signed   By: Lajean Manes M.D.   On: 03/17/2021 15:46   DG HIP UNILAT WITH PELVIS 2-3 VIEWS LEFT  Result Date: 03/17/2021 CLINICAL DATA:  Fall EXAM: DG HIP (WITH OR WITHOUT PELVIS) 2-3V LEFT COMPARISON:  None. FINDINGS: There is no evidence of acute left hip fracture. Partially visualized right hip arthroplasty. Mild left hip osteoarthritis. IMPRESSION: No evidence of left hip fracture. Electronically Signed   By: Maurine Simmering M.D.   On: 03/17/2021 15:54   DG HIP UNILAT WITH PELVIS 2-3 VIEWS RIGHT  Result Date: 03/17/2021 CLINICAL DATA:  Fall on 03/13/2021. Persistent pain in multiple locations including both shoulders and hips. EXAM: DG HIP (WITH OR WITHOUT PELVIS) 2-3V RIGHT COMPARISON:  None. FINDINGS: No fracture or bone lesion. Right hip hemiarthroplasty is well seated and aligned. Left hip joint, SI joints and symphysis pubis are normally spaced and aligned. Soft tissues are unremarkable. IMPRESSION: 1. No fracture or dislocation. 2. No evidence of loosening of the right hip hemiarthroplasty. Electronically Signed   By: Lajean Manes M.D.   On: 03/17/2021 15:47    Assessment/Plan 1.  Unwitnessed fall - incident occurred 12/13 - developed bruising to right upper arm, ? Increased sleepiness - ED 12/17- no fracture/ dislocation to bilateral shoulders/hips  2. Superficial bruising of arm, right, sequela - bruising improved - no tenderness or swelling noted - cont tylenol for pain  3. Late onset Alzheimer's dementia without behavioral disturbance ([place]) - nonverbal today - suspect further decline in disease - consider hospice if she shows more decline - cont skilled nursing care  4. Esophageal dysphagia - no recent aspirations - cont pureed diet and nectar thick liquids  5. Gastroesophageal reflux disease, unspecified whether esophagitis present - cont omeprazole  6. Depression, recurrent ([place]) - cont lexapro  7. Age-related osteoporosis without current pathological fracture - intolerance to fosamax - cont calcium/vit d supplement    Family/ staff Communication: plan discussed with nurse  Labs/tests ordered:  none

## 2021-04-04 ENCOUNTER — Encounter: Payer: Self-pay | Admitting: Orthopedic Surgery

## 2021-04-04 ENCOUNTER — Non-Acute Institutional Stay (SKILLED_NURSING_FACILITY): Payer: Medicare PPO | Admitting: Orthopedic Surgery

## 2021-04-04 DIAGNOSIS — F0280 Dementia in other diseases classified elsewhere without behavioral disturbance: Secondary | ICD-10-CM

## 2021-04-04 DIAGNOSIS — R634 Abnormal weight loss: Secondary | ICD-10-CM

## 2021-04-04 DIAGNOSIS — M810 Age-related osteoporosis without current pathological fracture: Secondary | ICD-10-CM

## 2021-04-04 DIAGNOSIS — G301 Alzheimer's disease with late onset: Secondary | ICD-10-CM

## 2021-04-04 DIAGNOSIS — K219 Gastro-esophageal reflux disease without esophagitis: Secondary | ICD-10-CM

## 2021-04-04 DIAGNOSIS — F339 Major depressive disorder, recurrent, unspecified: Secondary | ICD-10-CM

## 2021-04-04 DIAGNOSIS — R1319 Other dysphagia: Secondary | ICD-10-CM | POA: Diagnosis not present

## 2021-04-04 DIAGNOSIS — Z96641 Presence of right artificial hip joint: Secondary | ICD-10-CM

## 2021-04-04 DIAGNOSIS — D649 Anemia, unspecified: Secondary | ICD-10-CM

## 2021-04-04 NOTE — Progress Notes (Signed)
Location:   [facility] Room Number: [name] of Service:  SNF ([phone]) Provider:  [name], NP  [name], MD  Patient Care Team: [name], MD as PCP - General (Internal Medicine) Lindwood Coke, MD as Consulting Physician (Dermatology) Christophe Louis, MD as Consulting Physician (Obstetrics and Gynecology) Latanya Maudlin, MD as Consulting Physician (Orthopedic Surgery) Garlan Fair, MD as Consulting Physician (Gastroenterology) Barnett Abu., MD as Consulting Physician (Cardiology) Neldon Mc Donnamarie Poag, MD as Consulting Physician (Allergy and Immunology) Rexene Alberts, MD (Inactive) as Consulting Physician (Cardiothoracic Surgery) Melida Quitter, MD as Consulting Physician (Otolaryngology) Ngetich, Nelda Bucks, NP as Nurse Practitioner (Family Medicine)  Extended Emergency Contact Information Primary Emergency Contact: Kathie Rhodes of [place] Phone: [phone] Mobile Phone: [phone] Relation: Legal Guardian Secondary Emergency Contact: Leana Gamer [place] of [place] Phone: [phone] Relation: Uncle  Code Status:  DNR Managed Care Goals of care: Advanced Directive information Advanced Directives 04/04/2021  Does Patient Have a Medical Advance Directive? Yes  Type of Paramedic of [place];Out of facility DNR (pink MOST or yellow form)  Does patient want to make changes to medical advance directive? No - Patient declined  Copy of [place] in Chart? Yes - validated most recent copy scanned in chart (See row information)  Pre-existing out of facility DNR order (yellow form or pink MOST form) -     Chief Complaint  Patient presents with   Medical Management of Chronic Issues   Quality Metric Gaps    OPHTHALMOLOGY EXAM (Yearly)    Zoster Vaccines- Shingrix (1 of 2) 2018 URINE MICROALBUMIN (Yearly) 2022 HEMOGLOBIN A1C (Every 6 Months) COVID-19  Vaccine      HPI:  Pt is a 88 y.o. female seen today for medical management of chronic diseases.    She currently resides on the skilled nursing unit at [facility] Past medical history includes: hypertension, HLD, dysphagia, GERD, Alzheimer's disease, osteoporosis, thrombocytosis, unsteady gait and frequent falls  Alzheimer's- no recent behavioral outbursts, less talkative today, cannot complete sentence, spending more time in bed, remains on Namenda and Exelon  S/p total right hip arthroplasty- hospitalized 11/27/2020-12/02/2020 for right femoral neck fracture due to fall, ambulates less after incident, uses walker only with PT, uses wheelchair for ambulation, remains on scheduled tylenol Dysphagia- no recent aspirations, remains on pureed foods and nectar thick fluids, remains on Reglan Anemia- Hgb 10.5 01/15/2021 GERD- remains on Prilosec Depression- no mood changes, remains on Lexapro Osteoporosis- DEXA 2018 t score -2.6, did poorly with Fosamax, remains on calcium  Weight loss- gradual weight loss since right hip fracture, remains on med pass  No recent falls or injuries.   Recent blood pressures:  01/03- 136/78  12/27- 109/66  12/20- 146/75  Recent weights:  01/01- no recorded weight for January  12/03- 138.7 lbs  11/07- 141.1 lbs  10/05- 145.2 lbs      Past Medical History:  Diagnosis Date   Abnormal CT scan, chest September 2011   Scar tissue   Allergic rhinitis    Alzheimer's dementia ([place])    Asthma    Diabetes mellitus    borderline and under control-diet ,exercise   Esophageal stricture    Family history of colon cancer    Mother   GERD (gastroesophageal reflux disease)    Hiatal hernia    HLD (hyperlipidemia)    Hypertension    Osteopenia    Pneumonia 12-15 yrs.ago  yrs. ago   TIA (transient ischemic attack)    Vitamin D deficiency    Past Surgical History:  Procedure Laterality Date   ABDOMINAL HYSTERECTOMY  1980    ANTERIOR APPROACH  HEMI HIP ARTHROPLASTY Right 11/29/2020   Procedure: ANTERIOR APPROACH HEMI HIP ARTHROPLASTY;  Surgeon: Rod Can, MD;  Location: [facility];  Service: Orthopedics;  Laterality: Right;   BALLOON DILATION N/A 09/27/2018   Procedure: BALLOON DILATION;  Surgeon: Jackquline Denmark, MD;  Location: [facility];  Service: Endoscopy;  Laterality: N/A;   BIOPSY  09/27/2018   Procedure: BIOPSY;  Surgeon: Jackquline Denmark, MD;  Location: [facility];  Service: Endoscopy;;   CATARACT EXTRACTION Left 2014   ESOPHAGOGASTRODUODENOSCOPY (EGD) WITH PROPOFOL N/A 09/27/2018   Procedure: ESOPHAGOGASTRODUODENOSCOPY (EGD) WITH PROPOFOL;  Surgeon: Jackquline Denmark, MD;  Location: [facility];  Service: Endoscopy;  Laterality: N/A;   SHOULDER OPEN ROTATOR CUFF REPAIR  05/22/2011   Procedure: ROTATOR CUFF REPAIR SHOULDER OPEN;  Surgeon: Tobi Bastos, MD;  Location: [facility];  Service: Orthopedics;  Laterality: Left;   THORACOTOMY / DECORTICATION PARIETAL PLEURA Right 204   empyema   TONSILLECTOMY      Allergies  Allergen Reactions   Aricept [Donepezil Hcl] Diarrhea   Crestor [Rosuvastatin Calcium]     Myalgia   Lipitor [Atorvastatin Calcium]    Lovastatin     Myalgia   Micardis [Telmisartan]     Fatigue   Sertraline Diarrhea   Welchol [Colesevelam Hcl]     Constipation   Zetia [Ezetimibe]     Myalgia   Zocor [Simvastatin]     Myalgia    Allergies as of 04/04/2021       Reactions   Aricept [donepezil Hcl] Diarrhea   Crestor [rosuvastatin Calcium]    Myalgia   Lipitor [atorvastatin Calcium]    Lovastatin    Myalgia   Micardis [telmisartan]    Fatigue   Sertraline Diarrhea   Welchol [colesevelam Hcl]    Constipation   Zetia [ezetimibe]    Myalgia   Zocor [simvastatin]    Myalgia        Medication List        Accurate as of April 04, 2021  9:24 AM. If you have any questions, ask your nurse or doctor.          acetaminophen 500 MG tablet Commonly known as: TYLENOL Take 2 tablets (1,000  mg total) by mouth in the morning and at bedtime.   acetaminophen 500 MG tablet Commonly known as: TYLENOL Take 2 tablets (1,000 mg total) by mouth daily as needed.   aspirin EC 81 MG tablet Take 81 mg by mouth daily. Swallow whole.   Calcium 600-200 MG-UNIT tablet Take 1 tablet by mouth daily. In the morning 7-11 am   cetirizine 10 MG tablet Commonly known as: ZYRTEC Take 10 mg by mouth daily.   CHLORHEXIDINE GLUCONATE MT Use as directed 1 Dose in the mouth or throat daily. 1 tsp of solution to each teeth and gums with a toothbrush. Spit excess and do not rinse.   CHLORHEXIDINE GLUCONATE MT Use as directed 1 Dose in the mouth or throat 2 (two) times daily. 1 tsp of solution to each teeth and gums with a toothbrush. Spit excess and do not rinse.   escitalopram 10 MG tablet Commonly known as: LEXAPRO Take 1 tablet (10 mg total) by mouth daily.   ipratropium-albuterol 0.5-2.5 (3) MG/3ML Soln Commonly known as: DUONEB Take 3 mLs by nebulization every 6 (six) hours  as needed.   memantine 28 MG Cp24 24 hr capsule Commonly known as: NAMENDA XR TAKE ONE CAPSULE ONCE A DAY TO PRESERVE MEMORY.   metoCLOPramide 5 MG tablet Commonly known as: REGLAN Take 5 mg by mouth in the morning and at bedtime.   NON FORMULARY Med Pass 2.0 liquid; 2.0; amt: 180ML.; oral Special Instructions: Med Pass 2.0 180ML QD @ 2 PM. Doc. % consumed. Once a day   omeprazole 20 MG capsule Commonly known as: PRILOSEC Take 20 mg by mouth daily. Do not crush; Open capsule and sprinkle on applesauce   PreserVision AREDS 2 Chew Chew 1 tablet by mouth in the morning and at bedtime.   PreviDent 5000 Booster Plus 1.1 % Pste Generic drug: Sodium Fluoride Place 1 application onto teeth every evening.   rivastigmine 9.5 mg/24hr Commonly known as: EXELON Apply fresh patch daily and remove old patch to help preserve memory   zinc oxide 20 % ointment Apply 1 application topically as needed for irritation.  Apply to buttocks/peri topical as needed        Review of Systems  Unable to perform ROS: Dementia   Immunization History  Administered Date(s) Administered   Influenza, High Dose Seasonal PF 01/14/2019, 01/04/2020   Influenza,inj,Quad PF,6+ Mos 12/29/2017   Influenza-Unspecified 12/01/2014, 01/11/2016, 01/04/2020, 01/24/2021   Moderna SARS-COV2 Booster Vaccination 09/06/2020   Moderna Sars-Covid-2 Vaccination 04/05/2019, 05/03/2019, 02/14/2020   PFIZER(Purple Top)SARS-COV-2 Vaccination 12/20/2020   Pneumococcal Conjugate-13 11/22/2013   Pneumococcal Polysaccharide-23 07/01/2006   Tdap 12/17/2011   Zoster, Live 12/25/2005   Pertinent  Health Maintenance Due  Topic Date Due   OPHTHALMOLOGY EXAM  Never done   URINE MICROALBUMIN  02/11/2017   HEMOGLOBIN A1C  01/27/2021   FOOT EXAM  08/15/2021   INFLUENZA VACCINE  Completed   DEXA SCAN  Completed   Fall Risk 12/01/2020 12/01/2020 12/01/2020 12/02/2020 03/17/2021  Falls in the past year? - - - - -  Was there an injury with Fall? - - - - -  Fall Risk Category Calculator - - - - -  Fall Risk Category - - - - -  Patient Fall Risk Level High fall risk High fall risk High fall risk High fall risk High fall risk  Patient at Risk for Falls Due to - - - - -  Fall risk Follow up - - - - -   Functional Status Survey:    Vitals:   04/04/21 0915  BP: 136/78  Pulse: 63  Resp: 14  Temp: (!) 97 F (36.1 C)  SpO2: 94%  Weight: 138 lb 11.2 oz (62.9 kg)  Height: 5' (1.524 m)   Body mass index is 27.09 kg/m. Physical Exam Vitals reviewed.  Constitutional:      General: She is not in acute distress. HENT:     Head: Normocephalic.     Right Ear: There is no impacted cerumen.     Left Ear: There is no impacted cerumen.     Nose: Nose normal.     Mouth/Throat:     Mouth: Mucous membranes are moist.     Pharynx: No posterior oropharyngeal erythema.  Eyes:     General:        Right eye: No discharge.        Left eye: No discharge.   Neck:     Vascular: No carotid bruit.  Cardiovascular:     Rate and Rhythm: Normal rate and regular rhythm.     Pulses: Normal pulses.  Heart sounds: Normal heart sounds. No murmur heard. Pulmonary:     Effort: Pulmonary effort is normal. No respiratory distress.     Breath sounds: Normal breath sounds. No wheezing.  Abdominal:     General: Bowel sounds are normal. There is no distension.     Palpations: Abdomen is soft.     Tenderness: There is no abdominal tenderness.  Musculoskeletal:     Cervical back: Normal range of motion.     Right lower leg: No edema.     Left lower leg: No edema.  Lymphadenopathy:     Cervical: No cervical adenopathy.  Skin:    General: Skin is warm and dry.     Capillary Refill: Capillary refill takes less than 2 seconds.  Neurological:     General: No focal deficit present.     Mental Status: She is alert. Mental status is at baseline.     Motor: Weakness present.     Gait: Gait abnormal.     Comments: Wheelchair/walker with PT  Psychiatric:        Mood and Affect: Mood normal. Affect is flat.        Speech: Speech is delayed.        Behavior: Behavior normal.        Cognition and Memory: Memory is impaired.     Comments: Alert to self, did not follow commands, cannot finish sentence    Labs reviewed: Recent Labs    11/29/20 0435 11/30/20 0335 12/01/20 0320 12/11/20 0000  NA 143 139 137 141  K 3.7 3.3* 4.1 4.4  CL 103 102 103 102  CO2 30 28 27  34*  GLUCOSE 144* 153* 144*  --   BUN 22 25* 17 17  CREATININE 0.66 0.73 0.52 0.6  CALCIUM 9.1 8.0* 7.8* 8.6*  MG 2.1  --   --   --    Recent Labs    04/13/20 0000 07/28/20 0000 12/11/20 0000  AST 15 13 14   ALT 12 12 10   ALKPHOS 68 52 57  ALBUMIN 4.0 3.6 3.1*   Recent Labs    11/29/20 0435 11/30/20 0335 12/01/20 0320 12/02/20 0304 12/11/20 0000 01/15/21 0000  WBC 20.6* 19.6* 14.0* 11.8* 12.0 7.0  NEUTROABS 16.5*  --   --   --  8,112.00 3,927.00  HGB 12.2 9.3* 8.1* 7.7*  8.5* 10.5*  HCT 38.0 28.3* 24.9* 23.9* 27* 32*  MCV 94.5 93.4 94.0 93.4  --   --   PLT 224 198 191 234 392 265   Lab Results  Component Value Date   TSH 2.73 07/28/2020   Lab Results  Component Value Date   HGBA1C 5.5 07/28/2020   Lab Results  Component Value Date   CHOL 275 (H) 04/06/2018   HDL 52 04/06/2018   LDLCALC 190 (H) 04/06/2018   TRIG 161 (H) 04/06/2018   CHOLHDL 5.3 (H) 04/06/2018    Significant Diagnostic Results in last 30 days:  DG Chest 1 View  Result Date: 03/17/2021 CLINICAL DATA:  Fall, continued pain EXAM: CHEST  1 VIEW COMPARISON:  Radiograph 12/01/2020, chest CT 09/24/2018 FINDINGS: Unchanged cardiomediastinal silhouette. Stable chronic interstitial prominence. Mild streaky left basilar opacities, likely subsegmental atelectasis. No large pleural effusion. No visible pneumothorax. No acute osseous abnormality on single frontal view of the chest. IMPRESSION: Left basilar subsegmental atelectasis. Stable chronic interstitial prominence and pulmonary scarring. Electronically Signed   By: Maurine Simmering M.D.   On: 03/17/2021 15:51   DG Shoulder Right  Result  Date: 03/17/2021 CLINICAL DATA:  [place] on 03/13/2021. Persistent pain in multiple locations. EXAM: RIGHT SHOULDER - 2+ VIEW COMPARISON:  None. FINDINGS: No fracture or bone lesion. Glenohumeral joint normally aligned. Mild AC joint osteoarthritis. AC joint normally aligned. Significant narrowing of the subacromial space consistent with a chronic full-thickness rotator cuff tear. Skeletal structures are demineralized. Soft tissues are unremarkable. IMPRESSION: 1. No fracture or dislocation.  No acute finding. Electronically Signed   By: Lajean Manes M.D.   On: 03/17/2021 15:45   DG Shoulder Left  Result Date: 03/17/2021 CLINICAL DATA:  Fall on 03/13/2021. Persistent pain in multiple locations including both shoulders and hips. EXAM: LEFT SHOULDER - 2+ VIEW COMPARISON:  None. FINDINGS: No fracture or bone lesion.  Glenohumeral joint normally spaced and aligned. AC joint normally spaced and aligned. Mild narrowing of the subacromial space suspicious for a chronic full-thickness rotator cuff tear. Skeletal structures are demineralized. Soft tissues are unremarkable. IMPRESSION: 1. No fracture or dislocation.  No acute finding. Electronically Signed   By: Lajean Manes M.D.   On: 03/17/2021 15:46   DG HIP UNILAT WITH PELVIS 2-3 VIEWS LEFT  Result Date: 03/17/2021 CLINICAL DATA:  Fall EXAM: DG HIP (WITH OR WITHOUT PELVIS) 2-3V LEFT COMPARISON:  None. FINDINGS: There is no evidence of acute left hip fracture. Partially visualized right hip arthroplasty. Mild left hip osteoarthritis. IMPRESSION: No evidence of left hip fracture. Electronically Signed   By: Maurine Simmering M.D.   On: 03/17/2021 15:54   DG HIP UNILAT WITH PELVIS 2-3 VIEWS RIGHT  Result Date: 03/17/2021 CLINICAL DATA:  Fall on 03/13/2021. Persistent pain in multiple locations including both shoulders and hips. EXAM: DG HIP (WITH OR WITHOUT PELVIS) 2-3V RIGHT COMPARISON:  None. FINDINGS: No fracture or bone lesion. Right hip hemiarthroplasty is well seated and aligned. Left hip joint, SI joints and symphysis pubis are normally spaced and aligned. Soft tissues are unremarkable. IMPRESSION: 1. No fracture or dislocation. 2. No evidence of loosening of the right hip hemiarthroplasty. Electronically Signed   By: Lajean Manes M.D.   On: 03/17/2021 15:47    Assessment/Plan 1. Late onset Alzheimer's dementia without behavioral disturbance ([place]) - ambulates only with PT - spending more time in bed - less talkative - cont skilled nursing care - cong Namenda and Exelon  2. S/P total right hip arthroplasty - pain controlled with scheduled tylenol - cont PT  3. Esophageal dysphagia - no recent aspirations - cont Reglan - cont Pureed diet and nectar thick fluids  4. Anemia, unspecified type - hgb stable  5. Gastroesophageal reflux disease, unspecified  whether esophagitis present - cont Prilosec  6. Depression, recurrent ([place]) - no mood changes - cont Lexapro  7. Age-related osteoporosis without current pathological fracture - DEXA 2018, tscore -2.6 - did poorly with Fosamax - cont calcium supplement  8. Weight loss - losing 3-4 lbs per month - January weight not recorded - cont Med pass    Family/ staff Communication: plan discussed with patient and nurse  Labs/tests ordered:  none

## 2021-05-02 ENCOUNTER — Non-Acute Institutional Stay (SKILLED_NURSING_FACILITY): Payer: Medicare PPO | Admitting: Orthopedic Surgery

## 2021-05-02 ENCOUNTER — Encounter: Payer: Self-pay | Admitting: Orthopedic Surgery

## 2021-05-02 DIAGNOSIS — G301 Alzheimer's disease with late onset: Secondary | ICD-10-CM | POA: Diagnosis not present

## 2021-05-02 DIAGNOSIS — D649 Anemia, unspecified: Secondary | ICD-10-CM

## 2021-05-02 DIAGNOSIS — K219 Gastro-esophageal reflux disease without esophagitis: Secondary | ICD-10-CM

## 2021-05-02 DIAGNOSIS — M810 Age-related osteoporosis without current pathological fracture: Secondary | ICD-10-CM

## 2021-05-02 DIAGNOSIS — M24549 Contracture, unspecified hand: Secondary | ICD-10-CM

## 2021-05-02 DIAGNOSIS — R1319 Other dysphagia: Secondary | ICD-10-CM | POA: Diagnosis not present

## 2021-05-02 DIAGNOSIS — R634 Abnormal weight loss: Secondary | ICD-10-CM

## 2021-05-02 DIAGNOSIS — F0280 Dementia in other diseases classified elsewhere without behavioral disturbance: Secondary | ICD-10-CM

## 2021-05-02 DIAGNOSIS — F339 Major depressive disorder, recurrent, unspecified: Secondary | ICD-10-CM

## 2021-05-02 NOTE — Progress Notes (Signed)
Location:  [facility] Room Number: N03/A Place of Service:  SNF [phone]) Provider:  [name], NP  Patient Care Team: [name], MD as PCP - General (Internal Medicine) Lindwood Coke, MD as Consulting Physician (Dermatology) Christophe Louis, MD as Consulting Physician (Obstetrics and Gynecology) Latanya Maudlin, MD as Consulting Physician (Orthopedic Surgery) Garlan Fair, MD as Consulting Physician (Gastroenterology) Barnett Abu., MD as Consulting Physician (Cardiology) Neldon Mc Donnamarie Poag, MD as Consulting Physician (Allergy and Immunology) Rexene Alberts, MD (Inactive) as Consulting Physician (Cardiothoracic Surgery) Melida Quitter, MD as Consulting Physician (Otolaryngology) Ngetich, Nelda Bucks, NP as Nurse Practitioner (Family Medicine)  Extended Emergency Contact Information Primary Emergency Contact: Kathie Rhodes of [place] Phone: [phone] Mobile Phone: [phone] Relation: Legal Guardian Secondary Emergency Contact: Leana Gamer [place] of [place] Phone: [phone] Relation: Uncle  Code Status:  DNR Goals of care: Advanced Directive information Advanced Directives 05/02/2021  Does Patient Have a Medical Advance Directive? Yes  Type of Paramedic of [place];Out of facility DNR (pink MOST or yellow form)  Does patient want to make changes to medical advance directive? No - Patient declined  Copy of [place] in Chart? Yes - validated most recent copy scanned in chart (See row information)  Pre-existing out of facility DNR order (yellow form or pink MOST form) Yellow form placed in chart (order not valid for inpatient use);Pink MOST form placed in chart (order not valid for inpatient use)     Chief Complaint  Patient presents with   Medical Management of Chronic Issues    Routine visit. Discuss need for eye exam, Shingrix, MALB, and A1C or postpone if  patient refuses.    HPI:  Pt is a 88 y.o. female seen today for medical management of chronic diseases.    She currently resides on the skilled nursing unit at [facility]. Past medical history includes: hypertension, HLD, dysphagia, GERD, Alzheimer's disease, osteoporosis, thrombocytosis, unsteady gait and frequent falls   Alzheimer's- no recent behavioral outbursts, talking less, aphasia worse, ambulates with wheelchair after right hip fracture 11/27/2020,  remains on Namenda and Exelon  Dysphagia- no recent aspirations, remains on pureed foods and nectar thick fluids, remains on Reglan Anemia- Hgb 10.5 01/15/2021 GERD- remains on Prilosec Depression- no mood changes, remains on Lexapro Osteoporosis- DEXA 2018 t score -2.6, did poorly with Fosamax, remains on calcium  Hand contractures- her hands have become more contracted making it harder to feed herself, hands in splints today, nursing staff assists with feedings Weight loss- gradual weight loss since right hip fracture, see above, remains on med pass  No recent falls or injuries. Ambulates with wheelchair.   Recent blood pressures:  01/31- 132/64  01/24- 131/67  01/17- 106/63  Recent weights:  01/04- 135.9 lbs  12/03- 138.7 lbs  11/07- 141.1 lbs      Past Medical History:  Diagnosis Date   Abnormal CT scan, chest September 2011   Scar tissue   Allergic rhinitis    Alzheimer's dementia ([place])    Asthma    Diabetes mellitus    borderline and under control-diet ,exercise   Esophageal stricture    Family history of colon cancer    Mother   GERD (gastroesophageal reflux disease)    Hiatal hernia    HLD (hyperlipidemia)    Hypertension    Osteopenia    Pneumonia 12-15 yrs.ago   yrs. ago   TIA (transient ischemic attack)  Vitamin D deficiency    Past Surgical History:  Procedure Laterality Date   ABDOMINAL HYSTERECTOMY  1980    ANTERIOR APPROACH HEMI HIP ARTHROPLASTY Right 11/29/2020   Procedure:  ANTERIOR APPROACH HEMI HIP ARTHROPLASTY;  Surgeon: Rod Can, MD;  [facility]: [facility];  Service: Orthopedics;  Laterality: Right;   BALLOON DILATION N/A 09/27/2018   Procedure: BALLOON DILATION;  Surgeon: Jackquline Denmark, MD;  [facility];  Service: Endoscopy;  Laterality: N/A;   BIOPSY  09/27/2018   Procedure: BIOPSY;  Surgeon: Jackquline Denmark, MD;  [facility];  Service: Endoscopy;;   CATARACT EXTRACTION Left 2014   ESOPHAGOGASTRODUODENOSCOPY (EGD) WITH PROPOFOL N/A 09/27/2018   Procedure: ESOPHAGOGASTRODUODENOSCOPY (EGD) WITH PROPOFOL;  Surgeon: Jackquline Denmark, MD;  [facility];  Service: Endoscopy;  Laterality: N/A;   SHOULDER OPEN ROTATOR CUFF REPAIR  05/22/2011   Procedure: ROTATOR CUFF REPAIR SHOULDER OPEN;  Surgeon: Tobi Bastos, MD;  [facility];  Service: Orthopedics;  Laterality: Left;   THORACOTOMY / DECORTICATION PARIETAL PLEURA Right 204   empyema   TONSILLECTOMY      Allergies  Allergen Reactions   Aricept [Donepezil Hcl] Diarrhea   Crestor [Rosuvastatin Calcium]     Myalgia   Lipitor [Atorvastatin Calcium]    Lovastatin     Myalgia   Micardis [Telmisartan]     Fatigue   Sertraline Diarrhea   Welchol [Colesevelam Hcl]     Constipation   Zetia [Ezetimibe]     Myalgia   Zocor [Simvastatin]     Myalgia    Outpatient Encounter Medications as of 05/02/2021  Medication Sig   acetaminophen (TYLENOL) 500 MG tablet Take 2 tablets (1,000 mg total) by mouth in the morning and at bedtime.   acetaminophen (TYLENOL) 500 MG tablet Take 2 tablets (1,000 mg total) by mouth daily as needed.   aspirin EC 81 MG tablet Take 81 mg by mouth daily. Swallow whole.   Calcium 600-200 MG-UNIT tablet Take 1 tablet by mouth daily. In the morning 7-11 am   cetirizine (ZYRTEC) 10 MG tablet Take 10 mg by mouth daily.   CHLORHEXIDINE GLUCONATE MT Use as directed 1 Dose in the mouth or throat daily. 1 tsp of solution to each teeth and gums with a toothbrush.  Spit excess and do not rinse.   CHLORHEXIDINE GLUCONATE MT Use as directed 1 Dose in the mouth or throat 2 (two) times daily. 1 tsp of solution to each teeth and gums with a toothbrush. Spit excess and do not rinse.   escitalopram (LEXAPRO) 10 MG tablet Take 1 tablet (10 mg total) by mouth daily.   ipratropium-albuterol (DUONEB) 0.5-2.5 (3) MG/3ML SOLN Take 3 mLs by nebulization every 6 (six) hours as needed.   memantine (NAMENDA XR) 28 MG CP24 24 hr capsule TAKE ONE CAPSULE ONCE A DAY TO PRESERVE MEMORY.   metoCLOPramide (REGLAN) 5 MG tablet Take 5 mg by mouth in the morning and at bedtime.   Multiple Vitamins-Minerals (PRESERVISION AREDS 2) CHEW Chew 1 tablet by mouth in the morning and at bedtime.   NON FORMULARY Med Pass 2.0 liquid; 2.0; amt: 180ML.; oral Special Instructions: Med Pass 2.0 180ML QD @ 2 PM. Doc. % consumed. Once a day   omeprazole (PRILOSEC) 20 MG capsule Take 20 mg by mouth daily. Do not crush; Open capsule and sprinkle on applesauce   rivastigmine (EXELON) 9.5 mg/24hr Apply fresh patch daily and remove old patch to help preserve memory   Sodium Fluoride (PREVIDENT 5000 BOOSTER PLUS)  1.1 % PSTE Place 1 application onto teeth every evening.   zinc oxide 20 % ointment Apply 1 application topically as needed for irritation. Apply to buttocks/peri topical as needed   No facility-administered encounter medications on file as of 05/02/2021.    Review of Systems  Unable to perform ROS: Dementia   Immunization History  Administered Date(s) Administered   Influenza, High Dose Seasonal PF 01/14/2019, 01/04/2020   Influenza,inj,Quad PF,6+ Mos 12/29/2017   Influenza-Unspecified 12/01/2014, 01/11/2016, 01/04/2020, 01/24/2021   Moderna SARS-COV2 Booster Vaccination 09/06/2020   Moderna Sars-Covid-2 Vaccination 04/05/2019, 05/03/2019, 02/14/2020   PFIZER(Purple Top)SARS-COV-2 Vaccination 12/20/2020   Pfizer Covid-19 Vaccine Bivalent Booster 40yrs & up 12/20/2020   Pneumococcal  Conjugate-13 11/22/2013   Pneumococcal Polysaccharide-23 07/01/2006   Tdap 12/17/2011   Zoster, Live 12/25/2005   Pertinent  Health Maintenance Due  Topic Date Due   OPHTHALMOLOGY EXAM  Never done   URINE MICROALBUMIN  02/11/2017   HEMOGLOBIN A1C  01/27/2021   FOOT EXAM  08/15/2021   INFLUENZA VACCINE  Completed   DEXA SCAN  Completed   Fall Risk 12/01/2020 12/01/2020 12/01/2020 12/02/2020 03/17/2021  Falls in the past year? - - - - -  Was there an injury with Fall? - - - - -  Fall Risk Category Calculator - - - - -  Fall Risk Category - - - - -  Patient Fall Risk Level High fall risk High fall risk High fall risk High fall risk High fall risk  Patient at Risk for Falls Due to - - - - -  Fall risk Follow up - - - - -   Functional Status Survey:    Vitals:   05/02/21 0927  BP: 132/64  Pulse: 72  Resp: 20  Temp: 97.7 F (36.5 C)  SpO2: 94%  Weight: 135 lb 14.4 oz (61.6 kg)  Height: 5' (1.524 m)   Body mass index is 26.54 kg/m. Physical Exam Vitals reviewed.  Constitutional:      General: She is not in acute distress. HENT:     Head: Normocephalic.     Right Ear: There is no impacted cerumen.     Left Ear: There is no impacted cerumen.     Nose: Nose normal.     Mouth/Throat:     Mouth: Mucous membranes are moist.     Pharynx: No posterior oropharyngeal erythema.  Eyes:     General:        Right eye: No discharge.        Left eye: No discharge.  Neck:     Vascular: No carotid bruit.  Cardiovascular:     Rate and Rhythm: Normal rate and regular rhythm.     Pulses: Normal pulses.     Heart sounds: Normal heart sounds. No murmur heard. Pulmonary:     Effort: Pulmonary effort is normal. No respiratory distress.     Breath sounds: Normal breath sounds. No wheezing.  Abdominal:     General: Bowel sounds are normal. There is no distension.     Palpations: Abdomen is soft.     Tenderness: There is no abdominal tenderness.  Musculoskeletal:     Right upper arm: No  swelling, edema or tenderness.     Left upper arm: No swelling, edema or tenderness.     Right hand: Deformity present. No tenderness. Decreased range of motion. Decreased strength. Normal sensation. Normal capillary refill. Normal pulse.     Left hand: Deformity present. No tenderness. Decreased range of motion.  Decreased strength. Normal sensation. Normal capillary refill. Normal pulse.     Cervical back: Normal range of motion.     Right lower leg: No swelling, deformity or tenderness. No edema.     Left lower leg: No swelling, deformity or tenderness. No edema.     Comments: Rigidity to extremities  Lymphadenopathy:     Cervical: No cervical adenopathy.  Skin:    General: Skin is warm and dry.     Capillary Refill: Capillary refill takes less than 2 seconds.  Neurological:     General: No focal deficit present.     Mental Status: She is alert. Mental status is at baseline.     Motor: Weakness present.     Gait: Gait abnormal.  Psychiatric:        Mood and Affect: Mood normal.        Behavior: Behavior normal.        Cognition and Memory: Memory is impaired.     Comments: Aphasia, does not follow some commands, alert to self    Labs reviewed: Recent Labs    11/29/20 0435 11/30/20 0335 12/01/20 0320 12/11/20 0000  NA 143 139 137 141  K 3.7 3.3* 4.1 4.4  CL 103 102 103 102  CO2 30 28 27  34*  GLUCOSE 144* 153* 144*  --   BUN 22 25* 17 17  CREATININE 0.66 0.73 0.52 0.6  CALCIUM 9.1 8.0* 7.8* 8.6*  MG 2.1  --   --   --    Recent Labs    07/28/20 0000 12/11/20 0000  AST 13 14  ALT 12 10  ALKPHOS 52 57  ALBUMIN 3.6 3.1*   Recent Labs    11/29/20 0435 11/30/20 0335 12/01/20 0320 12/02/20 0304 12/11/20 0000 01/15/21 0000  WBC 20.6* 19.6* 14.0* 11.8* 12.0 7.0  NEUTROABS 16.5*  --   --   --  8,112.00 3,927.00  HGB 12.2 9.3* 8.1* 7.7* 8.5* 10.5*  HCT 38.0 28.3* 24.9* 23.9* 27* 32*  MCV 94.5 93.4 94.0 93.4  --   --   PLT 224 198 191 234 392 265   Lab Results   Component Value Date   TSH 2.73 07/28/2020   Lab Results  Component Value Date   HGBA1C 5.5 07/28/2020   Lab Results  Component Value Date   CHOL 275 (H) 04/06/2018   HDL 52 04/06/2018   LDLCALC 190 (H) 04/06/2018   TRIG 161 (H) 04/06/2018   CHOLHDL 5.3 (H) 04/06/2018    Significant Diagnostic Results in last 30 days:  No results found.  Assessment/Plan 1. Late onset Alzheimer's dementia without behavioral disturbance ([place]) - no recent behavioral outbursts - aphasia worse - ambulates with wheelchair - extremities seem more rigid - cont skilled nursing care - cont namenda and Exelon patch  2. Esophageal dysphagia - no recent aspirations - cont pureed diet - cont Reglan  3. Anemia, unspecified type - hgb stable  4. Gastroesophageal reflux disease, unspecified whether esophagitis present - hgb stable - cont Prilosec  5. Depression, recurrent ([place]) - no mood changes - continues to attend activities offered - cont Lexapro  6. Age-related osteoporosis without current pathological fracture - s/p right hip fracture 10/2020 - did poorly with Fosamax - cont calcium  7. Contracture of hand - requiring meal assistance - cont hand bracing  8. Weight loss - down 3 lbs from last month - cont med pass     Family/ staff Communication: plan discussed with nurse  Labs/tests ordered:  none

## 2021-05-24 ENCOUNTER — Non-Acute Institutional Stay (SKILLED_NURSING_FACILITY): Payer: Medicare PPO | Admitting: Internal Medicine

## 2021-05-24 DIAGNOSIS — L89152 Pressure ulcer of sacral region, stage 2: Secondary | ICD-10-CM | POA: Diagnosis not present

## 2021-05-25 ENCOUNTER — Encounter: Payer: Self-pay | Admitting: Internal Medicine

## 2021-05-25 NOTE — Progress Notes (Signed)
Location:   [facility] Room Number: 3 Place of Service:  SNF (31) Provider:  Mast, Lennie Odor NP  [name], MD  Patient Care Team: [name], MD as PCP - General (Internal Medicine) Lindwood Coke, MD as Consulting Physician (Dermatology) Christophe Louis, MD as Consulting Physician (Obstetrics and Gynecology) Latanya Maudlin, MD as Consulting Physician (Orthopedic Surgery) Garlan Fair, MD as Consulting Physician (Gastroenterology) Barnett Abu., MD as Consulting Physician (Cardiology) Neldon Mc Donnamarie Poag, MD as Consulting Physician (Allergy and Immunology) Rexene Alberts, MD (Inactive) as Consulting Physician (Cardiothoracic Surgery) Melida Quitter, MD as Consulting Physician (Otolaryngology) Ngetich, Nelda Bucks, NP as Nurse Practitioner (Family Medicine)  Extended Emergency Contact Information Primary Emergency Contact: Kathie Rhodes of [place] Phone: [phone] Mobile Phone: [phone] Relation: Legal Guardian Secondary Emergency Contact: Leana Gamer [place] of [place] Phone: [phone] Relation: Uncle  Code Status:  DNR Managed Care Goals of care: Advanced Directive information Advanced Directives 05/25/2021  Does Patient Have a Medical Advance Directive? Yes  Type of Paramedic of [place];Out of facility DNR (pink MOST or yellow form)  Does patient want to make changes to medical advance directive? No - Patient declined  Copy of [place] in Chart? Yes - validated most recent copy scanned in chart (See row information)  Pre-existing out of facility DNR order (yellow form or pink MOST form) Yellow form placed in chart (order not valid for inpatient use);Pink MOST form placed in chart (order not valid for inpatient use)     Chief Complaint  Patient presents with   Quality Metric Gaps    OPHTHALMOLOGY EXAM (Yearly)   Zoster Vaccines- Shingrix (1 of  2) 2018 URINE MICROALBUMIN (Yearly) 2022 HEMOGLOBIN A1C NCIR/Matrix verified    Acute Visit    HPI:  Pt is a 88 y.o. female seen today for an acute visit for Pressure wound  Patient has h/o Hyperlipidemia, Osteoporosis, Allergic Rhinitis and Dementia Alzheimer, Dysphagia  s/p Dilatation 6/20 Has h/o Choking Episodes on Reglan now. Displaced Fracture of Right Femoral Neck after Mechanical fall in 08/22  S since her surgery last year patient has mostly been wheelchair-bound.  Seen today for a pressure wound in her sacral area. Patient has aphasia due to her dementia Denies any pain. Wt Readings from Last 3 Encounters:  05/25/21 134 lb (60.8 kg)  05/02/21 135 lb 14.4 oz (61.6 kg)  04/04/21 138 lb 11.2 oz (62.9 kg)    Past Medical History:  Diagnosis Date   Abnormal CT scan, chest September 2011   Scar tissue   Allergic rhinitis    Alzheimer's dementia ([place])    Asthma    Diabetes mellitus    borderline and under control-diet ,exercise   Esophageal stricture    Family history of colon cancer    Mother   GERD (gastroesophageal reflux disease)    Hiatal hernia    HLD (hyperlipidemia)    Hypertension    Osteopenia    Pneumonia 12-15 yrs.ago   yrs. ago   TIA (transient ischemic attack)    Vitamin D deficiency    Past Surgical History:  Procedure Laterality Date   ABDOMINAL HYSTERECTOMY  1980    ANTERIOR APPROACH HEMI HIP ARTHROPLASTY Right 11/29/2020   Procedure: ANTERIOR APPROACH HEMI HIP ARTHROPLASTY;  Surgeon: Rod Can, MD;  [facility];  Service: Orthopedics;  Laterality: Right;   BALLOON DILATION N/A 09/27/2018   Procedure: BALLOON DILATION;  Surgeon: Jackquline Denmark,  MD;  [facility];  Service: Endoscopy;  Laterality: N/A;   BIOPSY  09/27/2018   Procedure: BIOPSY;  Surgeon: Jackquline Denmark, MD;  [facility];  Service: Endoscopy;;   CATARACT EXTRACTION Left 2014   ESOPHAGOGASTRODUODENOSCOPY (EGD) WITH PROPOFOL N/A 09/27/2018   Procedure:  ESOPHAGOGASTRODUODENOSCOPY (EGD) WITH PROPOFOL;  Surgeon: Jackquline Denmark, MD;  [facility];  Service: Endoscopy;  Laterality: N/A;   SHOULDER OPEN ROTATOR CUFF REPAIR  05/22/2011   Procedure: ROTATOR CUFF REPAIR SHOULDER OPEN;  Surgeon: Tobi Bastos, MD;  [facility];  Service: Orthopedics;  Laterality: Left;   THORACOTOMY / DECORTICATION PARIETAL PLEURA Right 204   empyema   TONSILLECTOMY      Allergies  Allergen Reactions   Aricept [Donepezil Hcl] Diarrhea   Crestor [Rosuvastatin Calcium]     Myalgia   Lipitor [Atorvastatin Calcium]    Lovastatin     Myalgia   Micardis [Telmisartan]     Fatigue   Sertraline Diarrhea   Welchol [Colesevelam Hcl]     Constipation   Zetia [Ezetimibe]     Myalgia   Zocor [Simvastatin]     Myalgia    Allergies as of 05/24/2021       Reactions   Aricept [donepezil Hcl] Diarrhea   Crestor [rosuvastatin Calcium]    Myalgia   Lipitor [atorvastatin Calcium]    Lovastatin    Myalgia   Micardis [telmisartan]    Fatigue   Sertraline Diarrhea   Welchol [colesevelam Hcl]    Constipation   Zetia [ezetimibe]    Myalgia   Zocor [simvastatin]    Myalgia        Medication List        Accurate as of May 24, 2021 11:59 PM. If you have any questions, ask your nurse or doctor.          acetaminophen 500 MG tablet Commonly known as: TYLENOL Take 2 tablets (1,000 mg total) by mouth in the morning and at bedtime.   acetaminophen 500 MG tablet Commonly known as: TYLENOL Take 2 tablets (1,000 mg total) by mouth daily as needed.   aspirin EC 81 MG tablet Take 81 mg by mouth daily. Swallow whole.   Calcium 600-200 MG-UNIT tablet Take 1 tablet by mouth daily. In the morning 7-11 am   cetirizine 10 MG tablet Commonly known as: ZYRTEC Take 10 mg by mouth daily.   CHLORHEXIDINE GLUCONATE MT Use as directed 1 Dose in the mouth or throat daily. 1 tsp of solution to each teeth and gums with a toothbrush. Spit excess and  do not rinse.   CHLORHEXIDINE GLUCONATE MT Use as directed 1 Dose in the mouth or throat 2 (two) times daily. 1 tsp of solution to each teeth and gums with a toothbrush. Spit excess and do not rinse.   escitalopram 10 MG tablet Commonly known as: LEXAPRO Take 1 tablet (10 mg total) by mouth daily.   ipratropium-albuterol 0.5-2.5 (3) MG/3ML Soln Commonly known as: DUONEB Take 3 mLs by nebulization every 6 (six) hours as needed.   memantine 28 MG Cp24 24 hr capsule Commonly known as: NAMENDA XR TAKE ONE CAPSULE ONCE A DAY TO PRESERVE MEMORY.   metoCLOPramide 5 MG tablet Commonly known as: REGLAN Take 5 mg by mouth in the morning and at bedtime.   NON FORMULARY Med Pass 2.0 liquid; 2.0; amt: 180ML.; oral Special Instructions: Med Pass 2.0 180ML QD @ 2 PM. Doc. % consumed. Once a day   omeprazole 20 MG  capsule Commonly known as: PRILOSEC Take 20 mg by mouth daily. Do not crush; Open capsule and sprinkle on applesauce   PreserVision AREDS 2 Chew Chew 1 tablet by mouth in the morning and at bedtime.   PreviDent 5000 Booster Plus 1.1 % Pste Generic drug: Sodium Fluoride Place 1 application onto teeth every evening.   rivastigmine 9.5 mg/24hr Commonly known as: EXELON Apply fresh patch daily and remove old patch to help preserve memory   zinc oxide 20 % ointment Apply 1 application topically as needed for irritation. Apply to buttocks/peri topical as needed        Review of Systems  Unable to perform ROS: Dementia   Immunization History  Administered Date(s) Administered   Influenza, High Dose Seasonal PF 01/14/2019, 01/04/2020   Influenza,inj,Quad PF,6+ Mos 12/29/2017   Influenza-Unspecified 12/01/2014, 01/11/2016, 01/04/2020, 01/24/2021   Moderna SARS-COV2 Booster Vaccination 09/06/2020   Moderna Sars-Covid-2 Vaccination 04/05/2019, 05/03/2019, 02/14/2020   PFIZER(Purple Top)SARS-COV-2 Vaccination 12/20/2020   Pfizer Covid-19 Vaccine Bivalent Booster 86yrs & up  12/20/2020   Pneumococcal Conjugate-13 11/22/2013   Pneumococcal Polysaccharide-23 07/01/2006   Tdap 12/17/2011   Zoster, Live 12/25/2005   Pertinent  Health Maintenance Due  Topic Date Due   OPHTHALMOLOGY EXAM  Never done   URINE MICROALBUMIN  02/11/2017   HEMOGLOBIN A1C  01/27/2021   FOOT EXAM  08/15/2021   INFLUENZA VACCINE  Completed   DEXA SCAN  Completed   Fall Risk 12/01/2020 12/01/2020 12/01/2020 12/02/2020 03/17/2021  Falls in the past year? - - - - -  Was there an injury with Fall? - - - - -  Fall Risk Category Calculator - - - - -  Fall Risk Category - - - - -  Patient Fall Risk Level High fall risk High fall risk High fall risk High fall risk High fall risk  Patient at Risk for Falls Due to - - - - -  Fall risk Follow up - - - - -   Functional Status Survey:    Vitals:   05/25/21 1034  BP: 127/71  Pulse: 70  Resp: 20  Temp: (!) 97 F (36.1 C)  SpO2: 94%  Weight: 134 lb (60.8 kg)  Height: 5' (1.524 m)   Body mass index is 26.17 kg/m. Physical Exam Vitals reviewed.  Constitutional:      Appearance: Normal appearance.  HENT:     Head: Normocephalic.     Nose: Nose normal.     Mouth/Throat:     Mouth: Mucous membranes are moist.     Pharynx: Oropharynx is clear.  Eyes:     Pupils: Pupils are equal, round, and reactive to light.  Cardiovascular:     Rate and Rhythm: Normal rate and regular rhythm.     Pulses: Normal pulses.     Heart sounds: Normal heart sounds. No murmur heard. Pulmonary:     Effort: Pulmonary effort is normal.     Breath sounds: Normal breath sounds.  Abdominal:     General: Abdomen is flat. Bowel sounds are normal.     Palpations: Abdomen is soft.  Musculoskeletal:        General: No swelling.     Cervical back: Neck supple.  Skin:    General: Skin is warm.     Comments: Stage 2 Pressure wound. Clean Base 1x1 cm  Neurological:     General: No focal deficit present.     Mental Status: She is alert.  Psychiatric:  Mood  and Affect: Mood normal.        Thought Content: Thought content normal.    Labs reviewed: Recent Labs    11/29/20 0435 11/30/20 0335 12/01/20 0320 12/11/20 0000  NA 143 139 137 141  K 3.7 3.3* 4.1 4.4  CL 103 102 103 102  CO2 30 28 27  34*  GLUCOSE 144* 153* 144*  --   BUN 22 25* 17 17  CREATININE 0.66 0.73 0.52 0.6  CALCIUM 9.1 8.0* 7.8* 8.6*  MG 2.1  --   --   --    Recent Labs    07/28/20 0000 12/11/20 0000  AST 13 14  ALT 12 10  ALKPHOS 52 57  ALBUMIN 3.6 3.1*   Recent Labs    11/29/20 0435 11/30/20 0335 12/01/20 0320 12/02/20 0304 12/11/20 0000 01/15/21 0000  WBC 20.6* 19.6* 14.0* 11.8* 12.0 7.0  NEUTROABS 16.5*  --   --   --  8,112.00 3,927.00  HGB 12.2 9.3* 8.1* 7.7* 8.5* 10.5*  HCT 38.0 28.3* 24.9* 23.9* 27* 32*  MCV 94.5 93.4 94.0 93.4  --   --   PLT 224 198 191 234 392 265   Lab Results  Component Value Date   TSH 2.73 07/28/2020   Lab Results  Component Value Date   HGBA1C 5.5 07/28/2020   Lab Results  Component Value Date   CHOL 275 (H) 04/06/2018   HDL 52 04/06/2018   LDLCALC 190 (H) 04/06/2018   TRIG 161 (H) 04/06/2018   CHOLHDL 5.3 (H) 04/06/2018    Significant Diagnostic Results in last 30 days:  No results found.  Assessment/Plan 1. Pressure injury of coccygeal region, stage 2 ([place]) Cover with Foam Dressing QOD  S/P total right hip arthroplasty Not Ambulatory anymore Tylenol for Pain    Anemia, unspecified type Hgb stable now   Late onset Alzheimer's dementia without behavioral disturbance ([facility]) On Namenda and Exelon    Esophageal dysphagia Continue Reglan  GERD On Prilosec Depression, recurrent ([facility]) Continue Lexapro Age-related osteoporosis without current pathological fracture Did not tolerate Fosamax       Family/ staff Communication:   Labs/tests ordered:

## 2021-06-22 ENCOUNTER — Non-Acute Institutional Stay (SKILLED_NURSING_FACILITY): Payer: Medicare PPO | Admitting: Orthopedic Surgery

## 2021-06-22 ENCOUNTER — Encounter: Payer: Self-pay | Admitting: Orthopedic Surgery

## 2021-06-22 DIAGNOSIS — D649 Anemia, unspecified: Secondary | ICD-10-CM

## 2021-06-22 DIAGNOSIS — M810 Age-related osteoporosis without current pathological fracture: Secondary | ICD-10-CM

## 2021-06-22 DIAGNOSIS — F0280 Dementia in other diseases classified elsewhere without behavioral disturbance: Secondary | ICD-10-CM

## 2021-06-22 DIAGNOSIS — M24549 Contracture, unspecified hand: Secondary | ICD-10-CM

## 2021-06-22 DIAGNOSIS — F339 Major depressive disorder, recurrent, unspecified: Secondary | ICD-10-CM

## 2021-06-22 DIAGNOSIS — R1319 Other dysphagia: Secondary | ICD-10-CM

## 2021-06-22 DIAGNOSIS — K219 Gastro-esophageal reflux disease without esophagitis: Secondary | ICD-10-CM

## 2021-06-22 DIAGNOSIS — R634 Abnormal weight loss: Secondary | ICD-10-CM

## 2021-06-22 DIAGNOSIS — R7303 Prediabetes: Secondary | ICD-10-CM

## 2021-06-22 DIAGNOSIS — L89152 Pressure ulcer of sacral region, stage 2: Secondary | ICD-10-CM

## 2021-06-22 DIAGNOSIS — G301 Alzheimer's disease with late onset: Secondary | ICD-10-CM

## 2021-06-22 NOTE — Progress Notes (Signed)
?Location:  [place] Room Number: N03/A ?Place of Service:  SNF (31) ?Provider: [name], NP ? ?Patient Care Team: ?[name], MD as PCP - General (Internal Medicine) ?Lindwood Coke, MD as Consulting Physician (Dermatology) ?Christophe Louis, MD as Consulting Physician (Obstetrics and Gynecology) ?Latanya Maudlin, MD as Consulting Physician (Orthopedic Surgery) ?Garlan Fair, MD as Consulting Physician (Gastroenterology) ?Barnett Abu., MD as Consulting Physician (Cardiology) ?Kozlow, Donnamarie Poag, MD as Consulting Physician (Allergy and Immunology) ?Rexene Alberts, MD (Inactive) as Consulting Physician (Cardiothoracic Surgery) ?Melida Quitter, MD as Consulting Physician (Otolaryngology) ?Ngetich, Nelda Bucks, NP as Nurse Practitioner (Family Medicine) ? ?Extended Emergency Contact Information ?Primary Emergency Contact: Kishbaugh-Smith,[name] ? [place] ?Home Phone: [phone] ?Mobile Phone: [phone] ?Relation: Legal Guardian ?Secondary Emergency Contact: Criscione,David ? [place] ?Home Phone: [phone] ?Relation: Uncle ? ?Code Status:  DNR ?Goals of care: Advanced Directive information ? ?  06/22/2021  ? 10:56 AM  ?Advanced Directives  ?Does Patient Have a Medical Advance Directive? Yes  ?Type of Paramedic of [place];Out of facility DNR (pink MOST or yellow form)  ?Does patient want to make changes to medical advance directive? No - Patient declined  ?Copy of [place] in Chart? Yes - validated most recent copy scanned in chart (See row information)  ?Pre-existing out of facility DNR order (yellow form or pink MOST form) Yellow form placed in chart (order not valid for inpatient use);Pink MOST form placed in chart (order not valid for inpatient use)  ? ? ? ?Chief Complaint  ?Patient presents with  ? Medical Management of Chronic Issues  ?  Routine visit.  ? Quality Metric Gaps  ?  Discuss the need for eye  exam, A1C, urine microalbumin, and Shingrix vaccine, or post pone if patient refuses.  ? ? ?HPI:  ?Pt is a 88 y.o. female seen today for medical management of chronic diseases.   ? ?She currently resides on the skilled nursing unit at [facility]. Past medical history includes: hypertension, HLD, dysphagia, GERD, Alzheimer's disease, osteoporosis, thrombocytosis, unsteady gait and frequent falls ? ?Alzheimer's- no recent behavioral outbursts, does not follow commands, aphasia worse, ambulates in W/C after hip fracture 10/2020, remains on Namenda and Exelon  ?Dysphagia- no recent aspirations, remains on pureed foods and nectar thick fluids, remains on Reglan ?Prediabetes- A1c 5.5 06/2020, not on  medication ?Anemia- Hgb 10.5 01/15/2021 ?GERD- remains on Prilosec ?Depression- no mood changes, remains on Lexapro ?Pressure wound to sacrum- noticed last month, skin now blanchable, remains on foam dressings daily and Prostat ?Osteoporosis- DEXA 2018 t score -2.6, did poorly with Fosamax, remains on calcium  ?Hand contractures- wearing hand splints, nursing staff assists with feedings ?Weight loss- see weights below, remains on med pass  ? ?No recent falls or injuries.  ? ?Recent blood pressures: ? 03/21- 119/65 ? 03/14- 123/54 ? 03/07- 133/70 ? ?Recent weights: ? 03/04- 132 lbs ? 02/02- 134 lbs ? 01/04- 135.9 lbs ? ? ?Past Medical History:  ?Diagnosis Date  ? Abnormal CT scan, chest September 2011  ? Scar tissue  ? Allergic rhinitis   ? Alzheimer's dementia ([place])   ? Asthma   ? Diabetes mellitus   ? borderline and under control-diet ,exercise  ? Esophageal stricture   ? Family history of colon cancer   ? Mother  ? GERD (gastroesophageal reflux disease)   ? Hiatal hernia   ? HLD (hyperlipidemia)   ? Hypertension   ?  Osteopenia   ? Pneumonia 12-15 yrs.ago  ? yrs. ago  ? TIA (transient ischemic attack)   ? Vitamin D deficiency   ? ?Past Surgical History:  ?Procedure Laterality Date  ? ABDOMINAL HYSTERECTOMY  1980   ?  ANTERIOR APPROACH HEMI HIP ARTHROPLASTY Right 11/29/2020  ? Procedure: ANTERIOR APPROACH HEMI HIP ARTHROPLASTY;  Surgeon: Rod Can, MD;  Location: [facility];  Service: Orthopedics;  Laterality: Right;  ? BALLOON DILATION N/A 09/27/2018  ? Procedure: BALLOON DILATION;  Surgeon: Jackquline Denmark, MD;  Location: [facility];  Service: Endoscopy;  Laterality: N/A;  ? BIOPSY  09/27/2018  ? Procedure: BIOPSY;  Surgeon: Jackquline Denmark, MD;  Location: [facility];  Service: Endoscopy;;  ? CATARACT EXTRACTION Left 2014  ? ESOPHAGOGASTRODUODENOSCOPY (EGD) WITH PROPOFOL N/A 09/27/2018  ? Procedure: ESOPHAGOGASTRODUODENOSCOPY (EGD) WITH PROPOFOL;  Surgeon: Jackquline Denmark, MD;  Location: [facility];  Service: Endoscopy;  Laterality: N/A;  ? SHOULDER OPEN ROTATOR CUFF REPAIR  05/22/2011  ? Procedure: ROTATOR CUFF REPAIR SHOULDER OPEN;  Surgeon: Tobi Bastos, MD;  Location: [facility];  Service: Orthopedics;  Laterality: Left;  ? THORACOTOMY / DECORTICATION PARIETAL PLEURA Right 204  ? empyema  ? TONSILLECTOMY    ? ? ?Allergies  ?Allergen Reactions  ? Aricept [Donepezil Hcl] Diarrhea  ? Crestor [Rosuvastatin Calcium]   ?  Myalgia  ? Lipitor [Atorvastatin Calcium]   ? Lovastatin   ?  Myalgia  ? Micardis [Telmisartan]   ?  Fatigue  ? Sertraline Diarrhea  ? Welchol [Colesevelam Hcl]   ?  Constipation  ? Zetia [Ezetimibe]   ?  Myalgia  ? Zocor [Simvastatin]   ?  Myalgia  ? ? ?Outpatient Encounter Medications as of 06/22/2021  ?Medication Sig  ? acetaminophen (TYLENOL) 500 MG tablet Take 2 tablets (1,000 mg total) by mouth in the morning and at bedtime.  ? acetaminophen (TYLENOL) 500 MG tablet Take 2 tablets (1,000 mg total) by mouth daily as needed.  ? Amino Acids-Protein Hydrolys (FEEDING SUPPLEMENT, PRO-STAT SUGAR FREE 64,) LIQD Take 30 mLs by mouth in the morning and at bedtime.  ? aspirin EC 81 MG tablet Take 81 mg by mouth daily. Swallow whole.  ? Calcium 600-200 MG-UNIT tablet Take 1 tablet by mouth daily. In the morning 7-11 am  ?  cetirizine (ZYRTEC) 10 MG tablet Take 10 mg by mouth daily.  ? CHLORHEXIDINE GLUCONATE MT Use as directed 1 Dose in the mouth or throat daily. 1 tsp of solution to each teeth and gums with a toothbrush. Spit excess and do not rinse.  ? CHLORHEXIDINE GLUCONATE MT Use as directed 1 Dose in the mouth or throat 2 (two) times daily. 1 tsp of solution to each teeth and gums with a toothbrush. Spit excess and do not rinse.  ? escitalopram (LEXAPRO) 10 MG tablet Take 1 tablet (10 mg total) by mouth daily.  ? ipratropium-albuterol (DUONEB) 0.5-2.5 (3) MG/3ML SOLN Take 3 mLs by nebulization every 6 (six) hours as needed.  ? memantine (NAMENDA XR) 28 MG CP24 24 hr capsule TAKE ONE CAPSULE ONCE A DAY TO PRESERVE MEMORY.  ? metoCLOPramide (REGLAN) 5 MG tablet Take 5 mg by mouth in the morning and at bedtime.  ? Multiple Vitamins-Minerals (PRESERVISION AREDS 2) CHEW Chew 1 tablet by mouth in the morning and at bedtime.  ? NON FORMULARY Med Pass 2.0 liquid; 2.0; amt: 180ML.; oral ?Special Instructions: Med Pass 2.0 180ML QD @ 2 PM. Doc. % consumed. ?Once a day  ? omeprazole (PRILOSEC) 20  MG capsule Take 20 mg by mouth daily. Do not crush; Open capsule and sprinkle on applesauce  ? rivastigmine (EXELON) 9.5 mg/24hr Apply fresh patch daily and remove old patch to help preserve memory  ? Sodium Fluoride (PREVIDENT 5000 BOOSTER PLUS) 1.1 % PSTE Place 1 application onto teeth every evening.  ? zinc oxide 20 % ointment Apply 1 application topically as needed for irritation. Apply to buttocks/peri topical as needed  ? ?No facility-administered encounter medications on file as of 06/22/2021.  ? ? ?Review of Systems  ?Unable to perform ROS: Dementia  ? ?Immunization History  ?Administered Date(s) Administered  ? Influenza, High Dose Seasonal PF 01/14/2019, 01/04/2020  ? Influenza,inj,Quad PF,6+ Mos 12/29/2017  ? Influenza-Unspecified 12/01/2014, 01/11/2016, 01/04/2020, 01/24/2021  ? Moderna SARS-COV2 Booster Vaccination 09/06/2020  ?  Moderna Sars-Covid-2 Vaccination 04/05/2019, 05/03/2019, 02/14/2020  ? PFIZER(Purple Top)SARS-COV-2 Vaccination 12/20/2020  ? Pension scheme manager 46yr & up 12/20/2020  ? Pneumococcal C

## 2021-06-25 LAB — BASIC METABOLIC PANEL
BUN: 15 (ref 4–21)
CO2: 29 — AB (ref 13–22)
Chloride: 106 (ref 99–108)
Creatinine: 0.5 (ref 0.5–1.1)
Glucose: 81
Potassium: 4.3 mEq/L (ref 3.5–5.1)
Sodium: 142 (ref 137–147)

## 2021-06-25 LAB — HEPATIC FUNCTION PANEL
ALT: 40 U/L — AB (ref 7–35)
AST: 35 (ref 13–35)
Alkaline Phosphatase: 59 (ref 25–125)
Bilirubin, Total: 0.7

## 2021-06-25 LAB — COMPREHENSIVE METABOLIC PANEL WITH GFR
Albumin: 3.5 (ref 3.5–5.0)
Calcium: 8.9 (ref 8.7–10.7)
Globulin: 2.5
eGFR: 90

## 2021-06-25 LAB — TSH: TSH: 3.09 (ref 0.41–5.90)

## 2021-06-25 LAB — HEMOGLOBIN A1C: Hemoglobin A1C: 5.3

## 2021-07-09 ENCOUNTER — Encounter: Payer: Self-pay | Admitting: Orthopedic Surgery

## 2021-07-09 ENCOUNTER — Non-Acute Institutional Stay (SKILLED_NURSING_FACILITY): Payer: Medicare PPO | Admitting: Orthopedic Surgery

## 2021-07-09 DIAGNOSIS — R7303 Prediabetes: Secondary | ICD-10-CM

## 2021-07-09 DIAGNOSIS — G301 Alzheimer's disease with late onset: Secondary | ICD-10-CM | POA: Diagnosis not present

## 2021-07-09 DIAGNOSIS — F339 Major depressive disorder, recurrent, unspecified: Secondary | ICD-10-CM

## 2021-07-09 DIAGNOSIS — M810 Age-related osteoporosis without current pathological fracture: Secondary | ICD-10-CM

## 2021-07-09 DIAGNOSIS — R400 Somnolence: Secondary | ICD-10-CM | POA: Diagnosis not present

## 2021-07-09 DIAGNOSIS — F0280 Dementia in other diseases classified elsewhere without behavioral disturbance: Secondary | ICD-10-CM

## 2021-07-09 DIAGNOSIS — K219 Gastro-esophageal reflux disease without esophagitis: Secondary | ICD-10-CM

## 2021-07-09 DIAGNOSIS — M24549 Contracture, unspecified hand: Secondary | ICD-10-CM

## 2021-07-09 DIAGNOSIS — L89152 Pressure ulcer of sacral region, stage 2: Secondary | ICD-10-CM

## 2021-07-09 DIAGNOSIS — D649 Anemia, unspecified: Secondary | ICD-10-CM

## 2021-07-09 DIAGNOSIS — R1319 Other dysphagia: Secondary | ICD-10-CM | POA: Diagnosis not present

## 2021-07-09 DIAGNOSIS — R634 Abnormal weight loss: Secondary | ICD-10-CM

## 2021-07-09 NOTE — Progress Notes (Signed)
?Location:  [place] Room Number: N03/A ?Place of Service:  SNF (31) ?Provider: [name], NP ? ?Patient Care Team: ?[name], MD as PCP - General (Internal Medicine) ?Lindwood Coke, MD as Consulting Physician (Dermatology) ?Christophe Louis, MD as Consulting Physician (Obstetrics and Gynecology) ?Latanya Maudlin, MD as Consulting Physician (Orthopedic Surgery) ?Garlan Fair, MD as Consulting Physician (Gastroenterology) ?Barnett Abu., MD as Consulting Physician (Cardiology) ?Kozlow, Donnamarie Poag, MD as Consulting Physician (Allergy and Immunology) ?Rexene Alberts, MD (Inactive) as Consulting Physician (Cardiothoracic Surgery) ?Melida Quitter, MD as Consulting Physician (Otolaryngology) ?Ngetich, Nelda Bucks, NP as Nurse Practitioner (Family Medicine) ? ?Extended Emergency Contact Information ?Primary Emergency Contact: Ambrosini-Smith,[name] ? [place] ?Home Phone: [phone] ?Mobile Phone: [phone] ?Relation: Legal Guardian ?Secondary Emergency Contact: Weikel,David ? [place] ?Home Phone: [phone] ?Relation: Uncle ? ?Code Status:  DNR ?Goals of care: Advanced Directive information ? ?  07/09/2021  ?  9:30 AM  ?Advanced Directives  ?Does Patient Have a Medical Advance Directive? Yes  ?Type of Paramedic of [place];Out of facility DNR (pink MOST or yellow form)  ?Does patient want to make changes to medical advance directive? No - Patient declined  ?Copy of [place] in Chart? Yes - validated most recent copy scanned in chart (See row information)  ?Pre-existing out of facility DNR order (yellow form or pink MOST form) Yellow form placed in chart (order not valid for inpatient use);Pink MOST form placed in chart (order not valid for inpatient use)  ? ? ? ?Chief Complaint  ?Patient presents with  ? Acute Visit  ?  Aspiration  ? ? ?HPI:  ?Pt is a 88 y.o. female seen today for an acute visit for aspiration.   ? ?04/09 she was being fed by nursing when she began choking. She was able to cough up food without difficulty. She was also oral suctioned. Today, she is lethargic during encounter. Nursing was able to feed her without difficulty. Diet :pureed foods and nectar thick fluids. Remains on reglan bid.  ? ?Alzheimer's- no recent behavioral outbursts, does not follow commands, aphasia worse, ambulates in W/C after hip fracture 10/2020, remains on Namenda and Exelon  ?Prediabetes- A1c 5.3 06/25/2021, not on medication ?Anemia- Hgb 10.5 01/15/2021 ?GERD- remains on Prilosec ?Depression- no mood changes, remains on Lexapro ?Pressure wound to sacrum- skin blanchable, remains on foam dressings daily and Prostat ?Osteoporosis- DEXA 2018 t score -2.6, did poorly with Fosamax, remains on calcium  ?Hand contractures- wearing hand splints, nursing staff assists with feedings ?Weight loss- TSH 3.09 06/25/2021, remains on med pass  ? ? ? ?Past Medical History:  ?Diagnosis Date  ? Abnormal CT scan, chest September 2011  ? Scar tissue  ? Allergic rhinitis   ? Alzheimer's dementia ([place])   ? Asthma   ? Diabetes mellitus   ? borderline and under control-diet ,exercise  ? Esophageal stricture   ? Family history of colon cancer   ? Mother  ? GERD (gastroesophageal reflux disease)   ? Hiatal hernia   ? HLD (hyperlipidemia)   ? Hypertension   ? Osteopenia   ? Pneumonia 12-15 yrs.ago  ? yrs. ago  ? TIA (transient ischemic attack)   ? Vitamin D deficiency   ? ?Past Surgical History:  ?Procedure Laterality Date  ? ABDOMINAL HYSTERECTOMY  1980   ? ANTERIOR APPROACH HEMI HIP ARTHROPLASTY Right 11/29/2020  ? Procedure: ANTERIOR APPROACH HEMI HIP ARTHROPLASTY;  Surgeon: Lyla Glassing,  Aaron Edelman, MD;  Location: [facility];  Service: Orthopedics;  Laterality: Right;  ? BALLOON DILATION N/A 09/27/2018  ? Procedure: BALLOON DILATION;  Surgeon: Jackquline Denmark, MD;  Location: [facility];  Service: Endoscopy;  Laterality: N/A;  ? BIOPSY  09/27/2018  ? Procedure: BIOPSY;   Surgeon: Jackquline Denmark, MD;  Location: [facility];  Service: Endoscopy;;  ? CATARACT EXTRACTION Left 2014  ? ESOPHAGOGASTRODUODENOSCOPY (EGD) WITH PROPOFOL N/A 09/27/2018  ? Procedure: ESOPHAGOGASTRODUODENOSCOPY (EGD) WITH PROPOFOL;  Surgeon: Jackquline Denmark, MD;  Location: [facility];  Service: Endoscopy;  Laterality: N/A;  ? SHOULDER OPEN ROTATOR CUFF REPAIR  05/22/2011  ? Procedure: ROTATOR CUFF REPAIR SHOULDER OPEN;  Surgeon: Tobi Bastos, MD;  Location: [facility];  Service: Orthopedics;  Laterality: Left;  ? THORACOTOMY / DECORTICATION PARIETAL PLEURA Right 204  ? empyema  ? TONSILLECTOMY    ? ? ?Allergies  ?Allergen Reactions  ? Aricept [Donepezil Hcl] Diarrhea  ? Crestor [Rosuvastatin Calcium]   ?  Myalgia  ? Lipitor [Atorvastatin Calcium]   ? Lovastatin   ?  Myalgia  ? Micardis [Telmisartan]   ?  Fatigue  ? Sertraline Diarrhea  ? Welchol [Colesevelam Hcl]   ?  Constipation  ? Zetia [Ezetimibe]   ?  Myalgia  ? Zocor [Simvastatin]   ?  Myalgia  ? ? ?Outpatient Encounter Medications as of 07/09/2021  ?Medication Sig  ? acetaminophen (TYLENOL) 500 MG tablet Take 2 tablets (1,000 mg total) by mouth in the morning and at bedtime.  ? acetaminophen (TYLENOL) 500 MG tablet Take 2 tablets (1,000 mg total) by mouth daily as needed.  ? Amino Acids-Protein Hydrolys (FEEDING SUPPLEMENT, PRO-STAT SUGAR FREE 64,) LIQD Take 30 mLs by mouth in the morning and at bedtime.  ? aspirin EC 81 MG tablet Take 81 mg by mouth daily. Swallow whole.  ? Calcium 600-200 MG-UNIT tablet Take 1 tablet by mouth daily. In the morning 7-11 am  ? cetirizine (ZYRTEC) 10 MG tablet Take 10 mg by mouth daily.  ? CHLORHEXIDINE GLUCONATE MT Use as directed 1 Dose in the mouth or throat daily. 1 tsp of solution to each teeth and gums with a toothbrush. Spit excess and do not rinse.  ? CHLORHEXIDINE GLUCONATE MT Use as directed 1 Dose in the mouth or throat 2 (two) times daily. 1 tsp of solution to each teeth and gums with a toothbrush. Spit excess and  do not rinse.  ? escitalopram (LEXAPRO) 10 MG tablet Take 1 tablet (10 mg total) by mouth daily.  ? ipratropium-albuterol (DUONEB) 0.5-2.5 (3) MG/3ML SOLN Take 3 mLs by nebulization every 6 (six) hours as needed.  ? memantine (NAMENDA XR) 28 MG CP24 24 hr capsule TAKE ONE CAPSULE ONCE A DAY TO PRESERVE MEMORY.  ? metoCLOPramide (REGLAN) 5 MG tablet Take 5 mg by mouth in the morning and at bedtime.  ? Multiple Vitamins-Minerals (PRESERVISION AREDS 2) CHEW Chew 1 tablet by mouth in the morning and at bedtime.  ? NON FORMULARY Med Pass 2.0 liquid; 2.0; amt: 180ML.; oral ?Special Instructions: Med Pass 2.0 180ML QD @ 2 PM. Doc. % consumed. ?Once a day  ? omeprazole (PRILOSEC) 20 MG capsule Take 20 mg by mouth daily. Do not crush; Open capsule and sprinkle on applesauce  ? rivastigmine (EXELON) 9.5 mg/24hr Apply fresh patch daily and remove old patch to help preserve memory  ? Sodium Fluoride (PREVIDENT 5000 BOOSTER PLUS) 1.1 % PSTE Place 1 application onto teeth every evening.  ? zinc oxide 20 % ointment  Apply 1 application topically as needed for irritation. Apply to buttocks/peri topical as needed  ? ?No facility-administered encounter medications on file as of 07/09/2021.  ? ? ?Review of Systems  ?Unable to perform ROS: Dementia  ? ?Immunization History  ?Administered Date(s) Administered  ? Influenza, High Dose Seasonal PF 01/14/2019, 01/04/2020  ? Influenza,inj,Quad PF,6+ Mos 12/29/2017  ? Influenza-Unspecified 12/01/2014, 01/11/2016, 01/04/2020, 01/24/2021  ? Moderna SARS-COV2 Booster Vaccination 09/06/2020  ? Moderna Sars-Covid-2 Vaccination 04/05/2019, 05/03/2019, 02/14/2020  ? PFIZER(Purple Top)SARS-COV-2 Vaccination 12/20/2020  ? Pension scheme manager 1yr & up 12/20/2020  ? Pneumococcal Conjugate-13 11/22/2013  ? Pneumococcal Polysaccharide-23 07/01/2006  ? Tdap 12/17/2011  ? Zoster, Live 12/25/2005  ? ?Pertinent  Health Maintenance Due  ?Topic Date Due  ? OPHTHALMOLOGY EXAM  Never done  ?  URINE MICROALBUMIN  02/11/2017  ? FOOT EXAM  08/15/2021  ? INFLUENZA VACCINE  10/30/2021  ? HEMOGLOBIN A1C  12/26/2021  ? DEXA SCAN  Completed  ? ? ?  12/01/2020  ?  1:00 AM 12/01/2020  ?  7:55 AM 9/2/

## 2021-07-11 LAB — CBC: RBC: 3.42 — AB (ref 3.87–5.11)

## 2021-07-11 LAB — BASIC METABOLIC PANEL
BUN: 16 (ref 4–21)
CO2: 27 — AB (ref 13–22)
Chloride: 106 (ref 99–108)
Creatinine: 0.5 (ref 0.5–1.1)
Glucose: 86
Potassium: 4.1 mEq/L (ref 3.5–5.1)
Sodium: 141 (ref 137–147)

## 2021-07-11 LAB — CBC AND DIFFERENTIAL
HCT: 32 — AB (ref 36–46)
Hemoglobin: 10.5 — AB (ref 12.0–16.0)
Neutrophils Absolute: 3435
Platelets: 232 10*3/uL (ref 150–400)
WBC: 7.5

## 2021-07-11 LAB — COMPREHENSIVE METABOLIC PANEL: Calcium: 8.6 — AB (ref 8.7–10.7)

## 2021-07-19 ENCOUNTER — Non-Acute Institutional Stay (SKILLED_NURSING_FACILITY): Payer: Medicare PPO | Admitting: Internal Medicine

## 2021-07-19 ENCOUNTER — Encounter: Payer: Self-pay | Admitting: Internal Medicine

## 2021-07-19 DIAGNOSIS — G301 Alzheimer's disease with late onset: Secondary | ICD-10-CM

## 2021-07-19 DIAGNOSIS — D649 Anemia, unspecified: Secondary | ICD-10-CM

## 2021-07-19 DIAGNOSIS — K219 Gastro-esophageal reflux disease without esophagitis: Secondary | ICD-10-CM | POA: Diagnosis not present

## 2021-07-19 DIAGNOSIS — R1319 Other dysphagia: Secondary | ICD-10-CM

## 2021-07-19 DIAGNOSIS — F0280 Dementia in other diseases classified elsewhere without behavioral disturbance: Secondary | ICD-10-CM

## 2021-07-19 DIAGNOSIS — F339 Major depressive disorder, recurrent, unspecified: Secondary | ICD-10-CM

## 2021-07-19 DIAGNOSIS — M810 Age-related osteoporosis without current pathological fracture: Secondary | ICD-10-CM

## 2021-07-19 NOTE — Progress Notes (Signed)
?Location:   [facility] Room Number: 3 ?Place of Service:  SNF (31) ?Provider:  Veleta Miners MD ? ?[name], MD ? ?Patient Care Team: ?[name], MD as PCP - General (Internal Medicine) ?Lindwood Coke, MD as Consulting Physician (Dermatology) ?Christophe Louis, MD as Consulting Physician (Obstetrics and Gynecology) ?Latanya Maudlin, MD as Consulting Physician (Orthopedic Surgery) ?Garlan Fair, MD as Consulting Physician (Gastroenterology) ?Barnett Abu., MD as Consulting Physician (Cardiology) ?Kozlow, Donnamarie Poag, MD as Consulting Physician (Allergy and Immunology) ?Rexene Alberts, MD (Inactive) as Consulting Physician (Cardiothoracic Surgery) ?Melida Quitter, MD as Consulting Physician (Otolaryngology) ?Ngetich, Nelda Bucks, NP as Nurse Practitioner (Family Medicine) ? ?Extended Emergency Contact Information ?Primary Emergency Contact: Pierpoint-Smith,[name] ? [place] ?Home Phone: [phone] ?Mobile Phone: [phone] ?Relation: Legal Guardian ?Secondary Emergency Contact: Mitrano,David ? [place] ?Home Phone: [phone] ?Relation: Uncle ? ?Code Status:  DNR Managed Care ?Goals of care: Advanced Directive information ? ?  07/19/2021  ? 11:16 AM  ?Advanced Directives  ?Does Patient Have a Medical Advance Directive? Yes  ?Type of Paramedic of [place];Out of facility DNR (pink MOST or yellow form)  ?Does patient want to make changes to medical advance directive? No - Patient declined  ?Copy of [place] in Chart? Yes - validated most recent copy scanned in chart (See row information)  ?Pre-existing out of facility DNR order (yellow form or pink MOST form) Yellow form placed in chart (order not valid for inpatient use);Pink MOST form placed in chart (order not valid for inpatient use)  ? ? ? ?Chief Complaint  ?Patient presents with  ? Medical Management of Chronic Issues  ? Quality Metric Gaps  ?  Verified  [facility] and [facility] patient is due for eye exam, Shingrix, and urine microalbumin. ?  ? ? ?HPI:  ?Pt is a 88 y.o. female seen today for medical management of chronic diseases.   ? ? ?Patient has h/o Hyperlipidemia, ?Osteoporosis, Allergic Rhinitis and ?Dementia Alzheimer, ?Dysphagia  s/p Dilatation 6/20 Has h/o Choking Episodes on Reglan now. ?Displaced Fracture of Right Femoral Neck after Mechanical fall in 08/22 ? ?  ? ?She is stable. No new Nursing issues. No Behavior issues ?Her weight is stable ?Stays in her wheelchair most of the time ?No Falls ?Wt Readings from Last 3 Encounters:  ?07/19/21 135 lb 3.2 oz (61.3 kg)  ?07/09/21 132 lb 1.6 oz (59.9 kg)  ?06/22/21 135 lb 14.4 oz (61.6 kg)  ?Had one episode of Choking again due to her Dysphagia but no coughing today ?Cannot give much history due to her Aphasia ? ?Past Medical History:  ?Diagnosis Date  ? Abnormal CT scan, chest September 2011  ? Scar tissue  ? Allergic rhinitis   ? Alzheimer's dementia ([place])   ? Asthma   ? Diabetes mellitus   ? borderline and under control-diet ,exercise  ? Esophageal stricture   ? Family history of colon cancer   ? Mother  ? GERD (gastroesophageal reflux disease)   ? Hiatal hernia   ? HLD (hyperlipidemia)   ? Hypertension   ? Osteopenia   ? Pneumonia 12-15 yrs.ago  ? yrs. ago  ? TIA (transient ischemic attack)   ? Vitamin D deficiency   ? ?Past Surgical History:  ?Procedure Laterality Date  ? ABDOMINAL HYSTERECTOMY  1980   ? ANTERIOR APPROACH HEMI HIP ARTHROPLASTY Right 11/29/2020  ? Procedure: ANTERIOR APPROACH HEMI HIP ARTHROPLASTY;  Surgeon: Rod Can, MD;  Location: [facility];  Service: Orthopedics;  Laterality: Right;  ? BALLOON DILATION N/A 09/27/2018  ? Procedure: BALLOON DILATION;  Surgeon: Jackquline Denmark, MD;  Location: [facility];  Service: Endoscopy;  Laterality: N/A;  ? BIOPSY  09/27/2018  ? Procedure: BIOPSY;  Surgeon: Jackquline Denmark, MD;  Location: [facility];  Service: Endoscopy;;  ? CATARACT EXTRACTION Left 2014  ?  ESOPHAGOGASTRODUODENOSCOPY (EGD) WITH PROPOFOL N/A 09/27/2018  ? Procedure: ESOPHAGOGASTRODUODENOSCOPY (EGD) WITH PROPOFOL;  Surgeon: Jackquline Denmark, MD;  Location: [facility];  Service: Endoscopy;  Laterality: N/A;  ? SHOULDER OPEN ROTATOR CUFF REPAIR  05/22/2011  ? Procedure: ROTATOR CUFF REPAIR SHOULDER OPEN;  Surgeon: Tobi Bastos, MD;  Location: [facility];  Service: Orthopedics;  Laterality: Left;  ? THORACOTOMY / DECORTICATION PARIETAL PLEURA Right 204  ? empyema  ? TONSILLECTOMY    ? ? ?Allergies  ?Allergen Reactions  ? Aricept [Donepezil Hcl] Diarrhea  ? Crestor [Rosuvastatin Calcium]   ?  Myalgia  ? Lipitor [Atorvastatin Calcium]   ? Lovastatin   ?  Myalgia  ? Micardis [Telmisartan]   ?  Fatigue  ? Sertraline Diarrhea  ? Welchol [Colesevelam Hcl]   ?  Constipation  ? Zetia [Ezetimibe]   ?  Myalgia  ? Zocor [Simvastatin]   ?  Myalgia  ? ? ?Allergies as of 07/19/2021   ? ?   Reactions  ? Aricept [donepezil Hcl] Diarrhea  ? Crestor [rosuvastatin Calcium]   ? Myalgia  ? Lipitor [atorvastatin Calcium]   ? Lovastatin   ? Myalgia  ? Micardis [telmisartan]   ? Fatigue  ? Sertraline Diarrhea  ? Welchol [colesevelam Hcl]   ? Constipation  ? Zetia [ezetimibe]   ? Myalgia  ? Zocor [simvastatin]   ? Myalgia  ? ?  ? ?  ?Medication List  ?  ? ?  ? Accurate as of July 19, 2021 11:23 AM. If you have any questions, ask your nurse or doctor.  ?  ?  ? ?  ? ?acetaminophen 500 MG tablet ?Commonly known as: TYLENOL ?Take 2 tablets (1,000 mg total) by mouth in the morning and at bedtime. ?  ?acetaminophen 500 MG tablet ?Commonly known as: TYLENOL ?Take 2 tablets (1,000 mg total) by mouth daily as needed. ?  ?aspirin EC 81 MG tablet ?Take 81 mg by mouth daily. Swallow whole. ?  ?Calcium 600-200 MG-UNIT tablet ?Take 1 tablet by mouth daily. In the morning 7-11 am ?  ?cetirizine 10 MG tablet ?Commonly known as: ZYRTEC ?Take 10 mg by mouth daily. ?  ?CHLORHEXIDINE GLUCONATE MT ?Use as directed 1 Dose in the mouth or throat daily. 1  tsp of solution to each teeth and gums with a toothbrush. Spit excess and do not rinse. ?  ?CHLORHEXIDINE GLUCONATE MT ?Use as directed 1 Dose in the mouth or throat 2 (two) times daily. 1 tsp of solution to each teeth and gums with a toothbrush. Spit excess and do not rinse. ?  ?escitalopram 10 MG tablet ?Commonly known as: LEXAPRO ?Take 1 tablet (10 mg total) by mouth daily. ?  ?feeding supplement (PRO-STAT SUGAR FREE 64) Liqd ?Take 30 mLs by mouth in the morning and at bedtime. ?  ?ipratropium-albuterol 0.5-2.5 (3) MG/3ML Soln ?Commonly known as: DUONEB ?Take 3 mLs by nebulization every 6 (six) hours as needed. ?  ?memantine 28 MG Cp24 24 hr capsule ?Commonly known as: NAMENDA XR ?TAKE ONE CAPSULE ONCE A DAY TO PRESERVE MEMORY. ?  ?metoCLOPramide 5 MG tablet ?Commonly known as: REGLAN ?  Take 5 mg by mouth in the morning and at bedtime. ?  ?NON FORMULARY ?Med Pass 2.0 liquid; 2.0; amt: 180ML.; oral ?Special Instructions: Med Pass 2.0 180ML QD @ 2 PM. Doc. % consumed. ?Once a day ?  ?omeprazole 20 MG capsule ?Commonly known as: PRILOSEC ?Take 20 mg by mouth daily. Do not crush; Open capsule and sprinkle on applesauce ?  ?PreserVision AREDS 2 Chew ?Chew 1 tablet by mouth in the morning and at bedtime. ?  ?PreviDent 5000 Booster Plus 1.1 % Pste ?Generic drug: Sodium Fluoride ?Place 1 application onto teeth every evening. ?  ?rivastigmine 9.5 mg/24hr ?Commonly known as: EXELON ?Apply fresh patch daily and remove old patch to help preserve memory ?  ?zinc oxide 20 % ointment ?Apply 1 application topically as needed for irritation. Apply to buttocks/peri topical as needed ?  ? ?  ? ? ?Review of Systems  ?Unable to perform ROS: Dementia  ? ?Immunization History  ?Administered Date(s) Administered  ? Influenza, High Dose Seasonal PF 01/14/2019, 01/04/2020  ? Influenza,inj,Quad PF,6+ Mos 12/29/2017  ? Influenza-Unspecified 12/01/2014, 01/11/2016, 01/04/2020, 01/24/2021  ? Moderna SARS-COV2 Booster Vaccination 09/06/2020  ?  Moderna Sars-Covid-2 Vaccination 04/05/2019, 05/03/2019, 02/14/2020  ? PFIZER(Purple Top)SARS-COV-2 Vaccination 12/20/2020  ? Pension scheme manager 23yr & up 12/20/2020  ? Pneumococc

## 2021-08-13 ENCOUNTER — Non-Acute Institutional Stay (SKILLED_NURSING_FACILITY): Payer: Medicare PPO | Admitting: Orthopedic Surgery

## 2021-08-13 ENCOUNTER — Encounter: Payer: Self-pay | Admitting: Orthopedic Surgery

## 2021-08-13 DIAGNOSIS — G301 Alzheimer's disease with late onset: Secondary | ICD-10-CM | POA: Diagnosis not present

## 2021-08-13 DIAGNOSIS — R1319 Other dysphagia: Secondary | ICD-10-CM | POA: Diagnosis not present

## 2021-08-13 DIAGNOSIS — F0280 Dementia in other diseases classified elsewhere without behavioral disturbance: Secondary | ICD-10-CM

## 2021-08-13 DIAGNOSIS — D649 Anemia, unspecified: Secondary | ICD-10-CM

## 2021-08-13 DIAGNOSIS — R634 Abnormal weight loss: Secondary | ICD-10-CM

## 2021-08-13 DIAGNOSIS — K219 Gastro-esophageal reflux disease without esophagitis: Secondary | ICD-10-CM

## 2021-08-13 DIAGNOSIS — R7303 Prediabetes: Secondary | ICD-10-CM | POA: Diagnosis not present

## 2021-08-13 DIAGNOSIS — M810 Age-related osteoporosis without current pathological fracture: Secondary | ICD-10-CM

## 2021-08-13 DIAGNOSIS — F339 Major depressive disorder, recurrent, unspecified: Secondary | ICD-10-CM

## 2021-08-13 NOTE — Progress Notes (Signed)
?Location:  [facility] Room Number: N03/A ?Place of Service:  SNF (31) ?Provider: [name], NP ? ? ?Patient Care Team: ?[name], MD as PCP - General (Internal Medicine) ?Lindwood Coke, MD as Consulting Physician (Dermatology) ?Christophe Louis, MD as Consulting Physician (Obstetrics and Gynecology) ?Latanya Maudlin, MD as Consulting Physician (Orthopedic Surgery) ?Garlan Fair, MD as Consulting Physician (Gastroenterology) ?Barnett Abu., MD as Consulting Physician (Cardiology) ?Kozlow, Donnamarie Poag, MD as Consulting Physician (Allergy and Immunology) ?Rexene Alberts, MD (Inactive) as Consulting Physician (Cardiothoracic Surgery) ?Melida Quitter, MD as Consulting Physician (Otolaryngology) ?Ngetich, Nelda Bucks, NP as Nurse Practitioner (Family Medicine) ? ?Extended Emergency Contact Information ?Primary Emergency Contact: Furrow-Smith,[name] ? [place] ?Home Phone: [phone] ?Mobile Phone: [phone] ?Relation: Legal Guardian ?Secondary Emergency Contact: Clendenin,David ? [place] ?Home Phone: [phone] ?Relation: Uncle ? ?Code Status:  DNR ?Goals of care: Advanced Directive information ? ?  08/13/2021  ?  9:28 AM  ?Advanced Directives  ?Does Patient Have a Medical Advance Directive? Yes  ?Type of Paramedic of [place];Out of facility DNR (pink MOST or yellow form)  ?Does patient want to make changes to medical advance directive? No - Patient declined  ?Copy of [place] in Chart? Yes - validated most recent copy scanned in chart (See row information)  ?Pre-existing out of facility DNR order (yellow form or pink MOST form) Yellow form placed in chart (order not valid for inpatient use);Pink MOST form placed in chart (order not valid for inpatient use)  ? ? ? ?Chief Complaint  ?Patient presents with  ? Medical Management of Chronic Issues  ?  Routine visit.  ? Quality Metric Gaps  ?  Discuss the need for  eye exam, Shingrix vaccine, and urine mircoalbumin, or post pone if patient refuses.  ? ? ?HPI:  ?Pt is a 88 y.o. female seen today for medical management of chronic diseases.   ? ?Alzheimer's- no recent behavioral outbursts, dependent with all ADLs- including feedings, aphasia, ambulates in w/c after hip fracture 10/2020, remains on Namenda and Exelon  ?Dysphagia- 04/09 concerns for aspiration, CXR negative for pulmonary infiltrate, remains on pureed diet with nectar thick fluids ?Prediabetes- A1c 5.3 06/25/2021, not on medication ?Anemia- Hgb 10.5 07/11/2021 ?GERD- remains on Prilosec ?Depression- no mood changes, remains on Lexapro ?Osteoporosis- DEXA 2018 t score -2.6, did poorly with Fosamax, remains on calcium  ?Weight loss- TSH 3.09 06/25/2021, remains on med pass  ? ?No recent falls or injuries.  ? ?Recent blood pressures: ? 05/09- 122/66 ? 05/02- 136/77 ? 04/25- 137/74 ? ?Recent weights: ? 05/03- 132.4 lbs ? 04/04- 132.1 lbs ? 03/04- 132 lbs ? ? ? ?Past Medical History:  ?Diagnosis Date  ? Abnormal CT scan, chest September 2011  ? Scar tissue  ? Allergic rhinitis   ? Alzheimer's dementia ([place])   ? Asthma   ? Diabetes mellitus   ? borderline and under control-diet ,exercise  ? Esophageal stricture   ? Family history of colon cancer   ? Mother  ? GERD (gastroesophageal reflux disease)   ? Hiatal hernia   ? HLD (hyperlipidemia)   ? Hypertension   ? Osteopenia   ? Pneumonia 12-15 yrs.ago  ? yrs. ago  ? TIA (transient ischemic attack)   ? Vitamin D deficiency   ? ?Past Surgical History:  ?Procedure Laterality Date  ? ABDOMINAL HYSTERECTOMY  1980   ? ANTERIOR APPROACH HEMI HIP ARTHROPLASTY Right 11/29/2020  ?  Procedure: ANTERIOR APPROACH HEMI HIP ARTHROPLASTY;  Surgeon: Rod Can, MD;  Location: [facility];  Service: Orthopedics;  Laterality: Right;  ? BALLOON DILATION N/A 09/27/2018  ? Procedure: BALLOON DILATION;  Surgeon: Jackquline Denmark, MD;  Location: [facility];  Service: Endoscopy;  Laterality: N/A;  ?  BIOPSY  09/27/2018  ? Procedure: BIOPSY;  Surgeon: Jackquline Denmark, MD;  Location: [facility];  Service: Endoscopy;;  ? CATARACT EXTRACTION Left 2014  ? ESOPHAGOGASTRODUODENOSCOPY (EGD) WITH PROPOFOL N/A 09/27/2018  ? Procedure: ESOPHAGOGASTRODUODENOSCOPY (EGD) WITH PROPOFOL;  Surgeon: Jackquline Denmark, MD;  [facility];  Service: Endoscopy;  Laterality: N/A;  ? SHOULDER OPEN ROTATOR CUFF REPAIR  05/22/2011  ? Procedure: ROTATOR CUFF REPAIR SHOULDER OPEN;  Surgeon: Tobi Bastos, MD;  Location: [facility];  Service: Orthopedics;  Laterality: Left;  ? THORACOTOMY / DECORTICATION PARIETAL PLEURA Right 204  ? empyema  ? TONSILLECTOMY    ? ? ?Allergies  ?Allergen Reactions  ? Aricept [Donepezil Hcl] Diarrhea  ? Crestor [Rosuvastatin Calcium]   ?  Myalgia  ? Lipitor [Atorvastatin Calcium]   ? Lovastatin   ?  Myalgia  ? Micardis [Telmisartan]   ?  Fatigue  ? Sertraline Diarrhea  ? Welchol [Colesevelam Hcl]   ?  Constipation  ? Zetia [Ezetimibe]   ?  Myalgia  ? Zocor [Simvastatin]   ?  Myalgia  ? ? ?Outpatient Encounter Medications as of 08/13/2021  ?Medication Sig  ? acetaminophen (TYLENOL) 500 MG tablet Take 2 tablets (1,000 mg total) by mouth in the morning and at bedtime.  ? acetaminophen (TYLENOL) 500 MG tablet Take 2 tablets (1,000 mg total) by mouth daily as needed.  ? Amino Acids-Protein Hydrolys (FEEDING SUPPLEMENT, PRO-STAT SUGAR FREE 64,) LIQD Take 30 mLs by mouth in the morning and at bedtime.  ? aspirin EC 81 MG tablet Take 81 mg by mouth daily. Swallow whole.  ? Calcium 600-200 MG-UNIT tablet Take 1 tablet by mouth daily. In the morning 7-11 am  ? cetirizine (ZYRTEC) 10 MG tablet Take 10 mg by mouth daily.  ? chlorhexidine (PERIDEX) 0.12 % solution Use as directed in the mouth or throat every evening. Brush on 1 tsp. of solution to teeth and gums with a  ?toothbrush after PM mouth care. Spit out excess and do not rinse.  ? CHLORHEXIDINE GLUCONATE MT Use as directed 1 Dose in the mouth or throat 2 (two)  times daily. 1 tsp of solution to each teeth and gums with a toothbrush. Spit excess and do not rinse.  ? escitalopram (LEXAPRO) 10 MG tablet Take 1 tablet (10 mg total) by mouth daily.  ? ipratropium-albuterol (DUONEB) 0.5-2.5 (3) MG/3ML SOLN Take 3 mLs by nebulization every 6 (six) hours as needed.  ? memantine (NAMENDA XR) 28 MG CP24 24 hr capsule TAKE ONE CAPSULE ONCE A DAY TO PRESERVE MEMORY.  ? metoCLOPramide (REGLAN) 5 MG tablet Take 5 mg by mouth in the morning and at bedtime.  ? Multiple Vitamins-Minerals (PRESERVISION AREDS 2) CHEW Chew 1 tablet by mouth in the morning and at bedtime.  ? NON FORMULARY Med Pass 2.0 liquid; 2.0; amt: 180ML.; oral ?Special Instructions: Med Pass 2.0 180ML QD @ 2 PM. Doc. % consumed. ?Once a day  ? omeprazole (PRILOSEC) 20 MG capsule Take 20 mg by mouth daily. Do not crush; Open capsule and sprinkle on applesauce  ? rivastigmine (EXELON) 9.5 mg/24hr Apply fresh patch daily and remove old patch to help preserve memory  ? Sodium Fluoride (PREVIDENT 5000 BOOSTER PLUS)  1.1 % PSTE Place 1 application onto teeth every evening.  ? zinc oxide 20 % ointment Apply 1 application topically as needed for irritation. Apply to buttocks/peri topical as needed  ? [DISCONTINUED] CHLORHEXIDINE GLUCONATE MT Use as directed 1 Dose in the mouth or throat daily. 1 tsp of solution to each teeth and gums with a toothbrush. Spit excess and do not rinse.  ? ?No facility-administered encounter medications on file as of 08/13/2021.  ? ? ?Review of Systems  ?Unable to perform ROS: Dementia  ? ?Immunization History  ?Administered Date(s) Administered  ? Influenza, High Dose Seasonal PF 01/14/2019, 01/04/2020  ? Influenza,inj,Quad PF,6+ Mos 12/29/2017  ? Influenza-Unspecified 12/01/2014, 01/11/2016, 01/04/2020, 01/24/2021  ? Moderna SARS-COV2 Booster Vaccination 09/06/2020  ? Moderna Sars-Covid-2 Vaccination 04/05/2019, 05/03/2019, 02/14/2020  ? PFIZER(Purple Top)SARS-COV-2 Vaccination 12/20/2020  ? Museum/gallery curator 24yr & up 12/20/2020  ? Pneumococcal Conjugate-13 11/22/2013  ? Pneumococcal Polysaccharide-23 07/01/2006  ? Tdap 12/17/2011  ? Zoster, Live 12/25/2005  ? ?Pertinent  Health M

## 2021-09-10 ENCOUNTER — Encounter: Payer: Self-pay | Admitting: Orthopedic Surgery

## 2021-09-10 ENCOUNTER — Non-Acute Institutional Stay (SKILLED_NURSING_FACILITY): Payer: Medicare PPO | Admitting: Orthopedic Surgery

## 2021-09-10 DIAGNOSIS — R1319 Other dysphagia: Secondary | ICD-10-CM | POA: Diagnosis not present

## 2021-09-10 DIAGNOSIS — F0280 Dementia in other diseases classified elsewhere without behavioral disturbance: Secondary | ICD-10-CM

## 2021-09-10 DIAGNOSIS — G301 Alzheimer's disease with late onset: Secondary | ICD-10-CM | POA: Diagnosis not present

## 2021-09-10 DIAGNOSIS — R634 Abnormal weight loss: Secondary | ICD-10-CM

## 2021-09-10 DIAGNOSIS — L89152 Pressure ulcer of sacral region, stage 2: Secondary | ICD-10-CM

## 2021-09-10 DIAGNOSIS — R7303 Prediabetes: Secondary | ICD-10-CM

## 2021-09-10 DIAGNOSIS — D649 Anemia, unspecified: Secondary | ICD-10-CM

## 2021-09-10 DIAGNOSIS — M810 Age-related osteoporosis without current pathological fracture: Secondary | ICD-10-CM

## 2021-09-10 DIAGNOSIS — K219 Gastro-esophageal reflux disease without esophagitis: Secondary | ICD-10-CM

## 2021-09-10 DIAGNOSIS — F339 Major depressive disorder, recurrent, unspecified: Secondary | ICD-10-CM

## 2021-09-10 NOTE — Progress Notes (Signed)
Location:  [facility] Room Number: N03/A Place of Service:  SNF [phone]) Provider: [name], NP  Patient Care Team: [name], MD as PCP - General (Internal Medicine) Lindwood Coke, MD as Consulting Physician (Dermatology) Christophe Louis, MD as Consulting Physician (Obstetrics and Gynecology) Latanya Maudlin, MD as Consulting Physician (Orthopedic Surgery) Garlan Fair, MD as Consulting Physician (Gastroenterology) Barnett Abu., MD as Consulting Physician (Cardiology) Neldon Mc Donnamarie Poag, MD as Consulting Physician (Allergy and Immunology) Rexene Alberts, MD (Inactive) as Consulting Physician (Cardiothoracic Surgery) Melida Quitter, MD as Consulting Physician (Otolaryngology) Ngetich, Nelda Bucks, NP as Nurse Practitioner (Family Medicine)  Extended Emergency Contact Information Primary Emergency Contact: Kathie [place] of [place] Phone: [phone] Mobile Phone: [phone] Relation: Legal Guardian Secondary Emergency Contact: Leana [place] of [place] Phone: [phone] Relation: Uncle  Code Status:  DNR Goals of care: Advanced Directive information    09/10/2021    9:25 AM  Advanced Directives  Does Patient Have a Medical Advance Directive? Yes  Type of Paramedic of [place];Out of facility DNR (pink MOST or yellow form)  Does patient want to make changes to medical advance directive? No - Patient declined  Copy of [place] in Chart? Yes - validated most recent copy scanned in chart (See row information)  Pre-existing out of facility DNR order (yellow form or pink MOST form) Yellow form placed in chart (order not valid for inpatient use);Pink MOST form placed in chart (order not valid for inpatient use)     Chief Complaint  Patient presents with   Medical Management of Chronic Issues    Routine visit.    Quality Metric Gaps    Foot exam today. Discuss  the need for eye exam, urine Microalbumin, and Shingrix vaccine, or post pone if patient refuses.     HPI:  Pt is a 88 y.o. female seen today for medical management of chronic diseases.    Weight loss- TSH 3.09 06/25/2021, see weights below, sleeping more during mealtime, does well at lunch, remains on med pass  Alzheimer's- no recent behavioral outbursts, dependent with all ADLs- including feedings, sleeping more during day, aphasia- will say one worded responses, ambulates in w/c after hip fracture 10/2020, remains on Namenda and Exelon  Dysphagia- no recent aspirations, remains on pureed diet with nectar thick fluids Prediabetes- A1c 5.3 06/25/2021, not on medication Anemia- Hgb 10.5 07/11/2021 GERD- hgb 10.5 07/11/2021, remains on Prilosec Depression- no mood changes, remains on Lexapro Osteoporosis- DEXA 2018 t score -2.6, did poorly with Fosamax, remains on calcium   No recent falls or injuries.   Recent blood pressures:  06/06- 116/63  05/30- 140/74  05/23- 119/68  Recent weights:  06/07- 131.2 lbs  05/10- 132.1 lbs  04/04- 132.1 lbs  12/03- 138.7 lbs  Past Medical History:  Diagnosis Date   Abnormal CT scan, chest September 2011   Scar tissue   Allergic rhinitis    Alzheimer's dementia ([place])    Asthma    Diabetes mellitus    borderline and under control-diet ,exercise   Esophageal stricture    Family history of colon cancer    Mother   GERD (gastroesophageal reflux disease)    Hiatal hernia    HLD (hyperlipidemia)    Hypertension    Osteopenia    Pneumonia 12-15 yrs.ago   yrs. ago   TIA (transient ischemic attack)    Vitamin D deficiency    Past  Surgical History:  Procedure Laterality Date   ABDOMINAL HYSTERECTOMY  1980    ANTERIOR APPROACH HEMI HIP ARTHROPLASTY Right 11/29/2020   Procedure: ANTERIOR APPROACH HEMI HIP ARTHROPLASTY;  Surgeon: Rod Can, MD;  Location: [facility];  Service: Orthopedics;  Laterality: Right;   BALLOON DILATION N/A 09/27/2018    Procedure: BALLOON DILATION;  Surgeon: Jackquline Denmark, MD;  Location: [facility];  Service: Endoscopy;  Laterality: N/A;   BIOPSY  09/27/2018   Procedure: BIOPSY;  Surgeon: Jackquline Denmark, MD;  Location: [facility];  Service: Endoscopy;;   CATARACT EXTRACTION Left 2014   ESOPHAGOGASTRODUODENOSCOPY (EGD) WITH PROPOFOL N/A 09/27/2018   Procedure: ESOPHAGOGASTRODUODENOSCOPY (EGD) WITH PROPOFOL;  Surgeon: Jackquline Denmark, MD;  Location: [facility];  Service: Endoscopy;  Laterality: N/A;   SHOULDER OPEN ROTATOR CUFF REPAIR  05/22/2011   Procedure: ROTATOR CUFF REPAIR SHOULDER OPEN;  Surgeon: Tobi Bastos, MD;  Location: [facility];  Service: Orthopedics;  Laterality: Left;   THORACOTOMY / DECORTICATION PARIETAL PLEURA Right 204   empyema   TONSILLECTOMY      Allergies  Allergen Reactions   Aricept [Donepezil Hcl] Diarrhea   Crestor [Rosuvastatin Calcium]     Myalgia   Lipitor [Atorvastatin Calcium]    Lovastatin     Myalgia   Micardis [Telmisartan]     Fatigue   Sertraline Diarrhea   Welchol [Colesevelam Hcl]     Constipation   Zetia [Ezetimibe]     Myalgia   Zocor [Simvastatin]     Myalgia    Outpatient Encounter Medications as of 09/10/2021  Medication Sig   acetaminophen (TYLENOL) 500 MG tablet Take 2 tablets (1,000 mg total) by mouth in the morning and at bedtime.   acetaminophen (TYLENOL) 500 MG tablet Take 2 tablets (1,000 mg total) by mouth daily as needed.   aspirin EC 81 MG tablet Take 81 mg by mouth daily. Swallow whole.   Calcium 600-200 MG-UNIT tablet Take 1 tablet by mouth daily. In the morning 7-11 am   cetirizine (ZYRTEC) 10 MG tablet Take 10 mg by mouth daily.   chlorhexidine (PERIDEX) 0.12 % solution Use as directed in the mouth or throat in the morning and at bedtime. Brush on 1 tsp. of solution to teeth and gums with a  toothbrush after PM mouth care. Spit out excess and do not rinse.   CHLORHEXIDINE GLUCONATE MT Use as directed 1 Dose in the mouth or throat 2  (two) times daily. 1 tsp of solution to each teeth and gums with a toothbrush. Spit excess and do not rinse.   escitalopram (LEXAPRO) 10 MG tablet Take 1 tablet (10 mg total) by mouth daily.   ipratropium-albuterol (DUONEB) 0.5-2.5 (3) MG/3ML SOLN Take 3 mLs by nebulization every 6 (six) hours as needed.   memantine (NAMENDA XR) 28 MG CP24 24 hr capsule TAKE ONE CAPSULE ONCE A DAY TO PRESERVE MEMORY.   metoCLOPramide (REGLAN) 5 MG tablet Take 5 mg by mouth in the morning and at bedtime.   Multiple Vitamins-Minerals (PRESERVISION AREDS 2) CHEW Chew 1 tablet by mouth in the morning and at bedtime.   NON FORMULARY Med Pass 2.0 liquid; 2.0; amt: 180ML.; oral Special Instructions: Med Pass 2.0 180ML QD @ 2 PM. Doc. % consumed. Once a day   omeprazole (PRILOSEC) 20 MG capsule Take 20 mg by mouth daily. Do not crush; Open capsule and sprinkle on applesauce   rivastigmine (EXELON) 9.5 mg/24hr Apply fresh patch daily and remove old patch to help preserve memory   Sodium  Fluoride (PREVIDENT 5000 BOOSTER PLUS) 1.1 % PSTE Place 1 application onto teeth every evening.   zinc oxide 20 % ointment Apply 1 application topically as needed for irritation. Apply to buttocks/peri topical as needed   [DISCONTINUED] Amino Acids-Protein Hydrolys (FEEDING SUPPLEMENT, PRO-STAT SUGAR FREE 64,) LIQD Take 30 mLs by mouth in the morning and at bedtime.   No facility-administered encounter medications on file as of 09/10/2021.    Review of Systems  Unable to perform ROS: Dementia    Immunization History  Administered Date(s) Administered   Influenza, High Dose Seasonal PF 01/14/2019, 01/04/2020   Influenza,inj,Quad PF,6+ Mos 12/29/2017   Influenza-Unspecified 12/01/2014, 01/11/2016, 01/04/2020, 01/24/2021   Moderna SARS-COV2 Booster Vaccination 09/06/2020   Moderna Sars-Covid-2 Vaccination 04/05/2019, 05/03/2019, 02/14/2020   PFIZER(Purple Top)SARS-COV-2 Vaccination 12/20/2020   Pfizer Covid-19 Vaccine Bivalent  Booster 19yr & up 12/20/2020   Pneumococcal Conjugate-13 11/22/2013   Pneumococcal Polysaccharide-23 07/01/2006   Tdap 12/17/2011   Zoster, Live 12/25/2005   Pertinent  Health Maintenance Due  Topic Date Due   OPHTHALMOLOGY EXAM  Never done   URINE MICROALBUMIN  02/11/2017   FOOT EXAM  08/15/2021   INFLUENZA VACCINE  10/30/2021   HEMOGLOBIN A1C  12/26/2021   DEXA SCAN  Completed      12/01/2020    1:00 AM 12/01/2020    7:55 AM 12/01/2020    9:35 PM 12/02/2020    7:30 AM 03/17/2021    2:28 PM  Fall Risk  Patient Fall Risk Level High fall risk High fall risk High fall risk High fall risk High fall risk   Functional Status Survey:    Vitals:   09/10/21 0921  BP: 116/63  Pulse: 90  Resp: 16  Temp: (!) 97.3 F (36.3 C)  SpO2: 95%  Weight: 131 lb 3.2 oz (59.5 kg)  Height: 5' (1.524 m)   Body mass index is 25.62 kg/m. Physical Exam Vitals reviewed.  Constitutional:      General: She is not in acute distress. HENT:     Head: Normocephalic.  Eyes:     General:        Right eye: No discharge.        Left eye: No discharge.  Cardiovascular:     Rate and Rhythm: Normal rate and regular rhythm.     Pulses: Normal pulses.     Heart sounds: Normal heart sounds.  Pulmonary:     Effort: Pulmonary effort is normal. No respiratory distress.     Breath sounds: Normal breath sounds. No wheezing.  Abdominal:     General: Bowel sounds are normal. There is no distension.     Palpations: Abdomen is soft.     Tenderness: There is no abdominal tenderness.  Musculoskeletal:     Cervical back: Neck supple.     Right lower leg: No edema.     Left lower leg: No edema.     Comments: Hands contracted  Skin:    General: Skin is warm and dry.     Capillary Refill: Capillary refill takes less than 2 seconds.     Comments: Small stage 2 pressure ulcer to sacrum, dressing CDI, surrounding skin intact  Neurological:     General: No focal deficit present.     Mental Status: She is easily  aroused. Mental status is at baseline.     Motor: Weakness present.     Gait: Gait abnormal.     Comments: Wheelchair  Psychiatric:        Mood  and Affect: Mood normal.        Behavior: Behavior normal.        Cognition and Memory: Cognition is impaired. Memory is impaired.     Comments: Easily aroused, non verbal today, does not follow commands, alert to self     Labs reviewed: Recent Labs    11/29/20 0435 11/30/20 0335 12/01/20 0320 12/11/20 0000 06/25/21 0000 07/11/21 0000  NA 143 139 137 141 142 141  K 3.7 3.3* 4.1 4.4 4.3 4.1  CL 103 102 103 102 106 106  CO2 '30 28 27 '$ 34* 29* 27*  GLUCOSE 144* 153* 144*  --   --   --   BUN 22 25* '17 17 15 16  '$ CREATININE 0.66 0.73 0.52 0.6 0.5 0.5  CALCIUM 9.1 8.0* 7.8* 8.6* 8.9 8.6*  MG 2.1  --   --   --   --   --    Recent Labs    12/11/20 0000 06/25/21 0000  AST 14 35  ALT 10 40*  ALKPHOS 57 59  ALBUMIN 3.1* 3.5   Recent Labs    11/30/20 0335 12/01/20 0320 12/02/20 0304 12/11/20 0000 01/15/21 0000 07/11/21 0000  WBC 19.6* 14.0* 11.8* 12.0 7.0 7.5  NEUTROABS  --   --   --  8,112.00 3,927.00 3,435.00  HGB 9.3* 8.1* 7.7* 8.5* 10.5* 10.5*  HCT 28.3* 24.9* 23.9* 27* 32* 32*  MCV 93.4 94.0 93.4  --   --   --   PLT 198 191 234 392 265 232   Lab Results  Component Value Date   TSH 3.09 06/25/2021   Lab Results  Component Value Date   HGBA1C 5.3 06/25/2021   Lab Results  Component Value Date   CHOL 275 (H) 04/06/2018   HDL 52 04/06/2018   LDLCALC 190 (H) 04/06/2018   TRIG 161 (H) 04/06/2018   CHOLHDL 5.3 (H) 04/06/2018    Significant Diagnostic Results in last 30 days:  No results found.  Assessment/Plan: 1. Weight loss - stable - eating less due to increased sleep - cont med pass - cont monthly weights  2. Pressure injury of sacral region, stage 2 ([place]) - followed by wound nurse - cont to cleanse with saline and cover with foam dressing  3. Late onset Alzheimer's dementia without behavioral  disturbance ([place]) - sleeping more during day - total care with all ADLs including feeding - plan to discuss hospice consult with family if weight loss/poor appetite progress  4. Esophageal dysphagia - no recent aspirations - cont pureed diet and nectar thick fluids  5. Prediabetes - A1c stable - not on medication  6. Anemia, unspecified type - hgb stable  7. Gastroesophageal reflux disease, unspecified whether esophagitis present - hgb able - cont Prilosec  8. Depression, recurrent ([place]) - no mood changes - cont Lexapro  9. Age-related osteoporosis without current pathological fracture - DEXA 2018, t score -2.6 - h/o hip fracture 10/2020 - unsuccessful trial of Fosamax - cont calcium supplement    Family/ staff Communication: plan discussed with patient and nurse  Labs/tests ordered:  none

## 2021-10-05 ENCOUNTER — Non-Acute Institutional Stay (SKILLED_NURSING_FACILITY): Payer: Medicare PPO | Admitting: Orthopedic Surgery

## 2021-10-05 ENCOUNTER — Encounter: Payer: Self-pay | Admitting: Orthopedic Surgery

## 2021-10-05 DIAGNOSIS — Z0000 Encounter for general adult medical examination without abnormal findings: Secondary | ICD-10-CM

## 2021-10-05 NOTE — Progress Notes (Signed)
Subjective:   Grace Gilbert is a 88 y.o. female who presents for Medicare Annual (Subsequent) preventive examination.  Place of Service: [facility] Provider: Windell Moulding, AGNP-C   Review of Systems           Objective:    Today's Vitals   10/05/21 1105  BP: 124/60  Pulse: 72  Resp: 20  Temp: (!) 97.4 F (36.3 C)  SpO2: 92%  Weight: 127 lb 4.8 oz (57.7 kg)  Height: 5' (1.524 m)   Body mass index is 24.86 kg/m.     09/10/2021    9:25 AM 08/13/2021    9:28 AM 07/19/2021   11:16 AM 07/09/2021    9:30 AM 06/22/2021   10:56 AM 05/25/2021   10:45 AM 05/02/2021    9:44 AM  Advanced Directives  Does Patient Have a Medical Advance Directive? Yes Yes Yes Yes Yes Yes Yes  Type of Paramedic of [place];Out of facility DNR (pink MOST or yellow form) [place];Out of facility DNR (pink MOST or yellow form) [place];Out of facility DNR (pink MOST or yellow form) [place];Out of facility DNR (pink MOST or yellow form) [place];Out of facility DNR (pink MOST or yellow form) [place];Out of facility DNR (pink MOST or yellow form) [place];Out of facility DNR (pink MOST or yellow form)  Does patient want to make changes to medical advance directive? No - Patient declined No - Patient declined No - Patient declined No - Patient declined No - Patient declined No - Patient declined No - Patient declined  Copy of [place] in Chart? Yes - validated most recent copy scanned in chart (See row information) Yes - validated most recent copy scanned in chart (See row information) Yes - validated most recent copy scanned in chart (See row information) Yes - validated most recent copy scanned in chart (See row information) Yes - validated most recent copy scanned in chart (See row information) Yes - validated most recent copy  scanned in chart (See row information) Yes - validated most recent copy scanned in chart (See row information)  Pre-existing out of facility DNR order (yellow form or pink MOST form) Yellow form placed in chart (order not valid for inpatient use);Pink MOST form placed in chart (order not valid for inpatient use) Yellow form placed in chart (order not valid for inpatient use);Pink MOST form placed in chart (order not valid for inpatient use) Yellow form placed in chart (order not valid for inpatient use);Pink MOST form placed in chart (order not valid for inpatient use) Yellow form placed in chart (order not valid for inpatient use);Pink MOST form placed in chart (order not valid for inpatient use) Yellow form placed in chart (order not valid for inpatient use);Pink MOST form placed in chart (order not valid for inpatient use) Yellow form placed in chart (order not valid for inpatient use);Pink MOST form placed in chart (order not valid for inpatient use) Yellow form placed in chart (order not valid for inpatient use);Pink MOST form placed in chart (order not valid for inpatient use)    Current Medications (verified) Outpatient Encounter Medications as of 10/05/2021  Medication Sig   acetaminophen (TYLENOL) 500 MG tablet Take 2 tablets (1,000 mg total) by mouth in the morning and at bedtime.   acetaminophen (TYLENOL) 500 MG tablet Take 2 tablets (1,000 mg total) by mouth daily  as needed.   aspirin EC 81 MG tablet Take 81 mg by mouth daily. Swallow whole.   Calcium 600-200 MG-UNIT tablet Take 1 tablet by mouth daily. In the morning 7-11 am   cetirizine (ZYRTEC) 10 MG tablet Take 10 mg by mouth daily.   chlorhexidine (PERIDEX) 0.12 % solution Use as directed in the mouth or throat in the morning and at bedtime. Brush on 1 tsp. of solution to teeth and gums with a  toothbrush after PM mouth care. Spit out excess and do not rinse.   CHLORHEXIDINE GLUCONATE MT Use as directed 1 Dose in the mouth or throat 2  (two) times daily. 1 tsp of solution to each teeth and gums with a toothbrush. Spit excess and do not rinse.   escitalopram (LEXAPRO) 10 MG tablet Take 1 tablet (10 mg total) by mouth daily.   ipratropium-albuterol (DUONEB) 0.5-2.5 (3) MG/3ML SOLN Take 3 mLs by nebulization every 6 (six) hours as needed.   memantine (NAMENDA XR) 28 MG CP24 24 hr capsule TAKE ONE CAPSULE ONCE A DAY TO PRESERVE MEMORY.   metoCLOPramide (REGLAN) 5 MG tablet Take 5 mg by mouth in the morning and at bedtime.   Multiple Vitamins-Minerals (PRESERVISION AREDS 2) CHEW Chew 1 tablet by mouth in the morning and at bedtime.   NON FORMULARY Med Pass 2.0 liquid; 2.0; amt: 180ML.; oral Special Instructions: Med Pass 2.0 180ML QD @ 2 PM. Doc. % consumed. Once a day   omeprazole (PRILOSEC) 20 MG capsule Take 20 mg by mouth daily. Do not crush; Open capsule and sprinkle on applesauce   rivastigmine (EXELON) 9.5 mg/24hr Apply fresh patch daily and remove old patch to help preserve memory   Sodium Fluoride (PREVIDENT 5000 BOOSTER PLUS) 1.1 % PSTE Place 1 application onto teeth every evening.   zinc oxide 20 % ointment Apply 1 application topically as needed for irritation. Apply to buttocks/peri topical as needed   No facility-administered encounter medications on file as of 10/05/2021.    Allergies (verified) Aricept [donepezil hcl], Crestor [rosuvastatin calcium], Lipitor [atorvastatin calcium], Lovastatin, Micardis [telmisartan], Sertraline, Welchol [colesevelam hcl], Zetia [ezetimibe], and Zocor [simvastatin]   History: Past Medical History:  Diagnosis Date   Abnormal CT scan, chest September 2011   Scar tissue   Allergic rhinitis    Alzheimer's dementia ([place])    Asthma    Diabetes mellitus    borderline and under control-diet ,exercise   Esophageal stricture    Family history of colon cancer    Mother   GERD (gastroesophageal reflux disease)    Hiatal hernia    HLD (hyperlipidemia)    Hypertension    Osteopenia     Pneumonia 12-15 yrs.ago   yrs. ago   TIA (transient ischemic attack)    Vitamin D deficiency    Past Surgical History:  Procedure Laterality Date   ABDOMINAL HYSTERECTOMY  1980    ANTERIOR APPROACH HEMI HIP ARTHROPLASTY Right 11/29/2020   Procedure: ANTERIOR APPROACH HEMI HIP ARTHROPLASTY;  Surgeon: Rod Can, MD;  Location: [facility];  Service: Orthopedics;  Laterality: Right;   BALLOON DILATION N/A 09/27/2018   Procedure: BALLOON DILATION;  Surgeon: Jackquline Denmark, MD;  Location: [facility];  Service: Endoscopy;  Laterality: N/A;   BIOPSY  09/27/2018   Procedure: BIOPSY;  Surgeon: Jackquline Denmark, MD;  Location: [facility];  Service: Endoscopy;;   CATARACT EXTRACTION Left 2014   ESOPHAGOGASTRODUODENOSCOPY (EGD) WITH PROPOFOL N/A 09/27/2018   Procedure: ESOPHAGOGASTRODUODENOSCOPY (EGD) WITH PROPOFOL;  Surgeon: Jackquline Denmark, MD;  Location: [facility];  Service: Endoscopy;  Laterality: N/A;   SHOULDER OPEN ROTATOR CUFF REPAIR  05/22/2011   Procedure: ROTATOR CUFF REPAIR SHOULDER OPEN;  Surgeon: Tobi Bastos, MD;  Location: [facility];  Service: Orthopedics;  Laterality: Left;   THORACOTOMY / DECORTICATION PARIETAL PLEURA Right 204   empyema   TONSILLECTOMY     No family history on file. Social History   Socioeconomic History   Marital status: Single    Spouse name: Not on file   Number of children: Not on file   Years of education: Not on file   Highest education level: Not on file  Occupational History   Occupation: retired Pharmacist, hospital  Tobacco Use   Smoking status: Former    Packs/day: 1.50    Years: 20.00    Total pack years: 30.00    Types: Cigarettes    Quit date: 05/15/1987    Years since quitting: 34.4   Smokeless tobacco: Never  Vaping Use   Vaping Use: Never used  Substance and Sexual Activity   Alcohol use: No    Alcohol/week: 1.0 standard drink of alcohol    Types: 1 Glasses of wine per week   Drug use: No   Sexual activity: Never  Other Topics Concern    Not on file  Social History Narrative   [facility] at [facility] since 07/27/2014   Previous math Pharmacist, hospital.   Never married   Former smoker stopped 1989   Alcohol occasionally    POA niece Terance Hart [place], [place]   Social Determinants of Health   Financial Resource Strain: Low Risk  (10/05/2021)   Overall Financial Resource Strain (CARDIA)    Difficulty of Paying Living Expenses: Not hard at all  Food Insecurity: Not on file  Transportation Needs: No Transportation Needs (10/05/2021)   PRAPARE - Hydrologist (Medical): No    Lack of Transportation (Non-Medical): No  Physical Activity: Inactive (10/05/2021)   Exercise Vital Sign    Days of Exercise per Week: 0 days    Minutes of Exercise per Session: 0 min  Stress: Not on file  Social Connections: Not on file    Tobacco Counseling Counseling given: Not Answered   Clinical Intake:  Pre-visit preparation completed: Yes  Pain : Faces Faces Pain Scale: No hurt Pain Type:  (no right hip pain) Pain Location: Hip Pain Orientation: Right Pain Frequency: Intermittent  Faces Pain Scale: No hurt  BMI - recorded: 24.86 Nutritional Status: BMI of 19-24  Normal Nutritional Risks: Unintentional weight loss (Alzheimers dementia- late stage) Diabetes: No  How often do you need to have someone help you when you read instructions, pamphlets, or other written materials from your doctor or pharmacy?: 5 - Always (Alzheimers dementia- late stage) What is the last grade level you completed in school?: unable to answer  Diabetic?No  Interpreter Needed?: No      Activities of Daily Living    11/28/2020    8:14 AM 11/28/2020    8:13 AM  In your present state of health, do you have any difficulty performing the following activities:  Hearing?  0  Vision?  0  Difficulty concentrating or making decisions?  1  Walking or climbing stairs?  1  Comment  secondary to right femur injury  Dressing or bathing?   1  Doing errands, shopping? 1     Patient Care Team: [name], MD as PCP - General (Internal Medicine) Lindwood Coke, MD as  Consulting Physician (Dermatology) Christophe Louis, MD as Consulting Physician (Obstetrics and Gynecology) Latanya Maudlin, MD as Consulting Physician (Orthopedic Surgery) Garlan Fair, MD as Consulting Physician (Gastroenterology) Barnett Abu., MD as Consulting Physician (Cardiology) Neldon Mc, Donnamarie Poag, MD as Consulting Physician (Allergy and Immunology) Rexene Alberts, MD (Inactive) as Consulting Physician (Cardiothoracic Surgery) Melida Quitter, MD as Consulting Physician (Otolaryngology) Ngetich, Nelda Bucks, NP as Nurse Practitioner (Family Medicine)  Indicate any recent Medical Services you may have received from other than [facility] providers in the past year (date may be approximate).     Assessment:   This is a routine wellness examination for [place].  Hearing/Vision screen No results found.  Dietary issues and exercise activities discussed:     Goals Addressed             This Visit's Progress    Maintain Mobility and Function   Not on track    Evidence-based guidance:  Emphasize the importance of physical activity and aerobic exercise as included in treatment plan; assess barriers to adherence; consider patient's abilities and preferences.  Encourage gradual increase in activity or exercise instead of stopping if pain occurs.  Reinforce individual therapy exercise prescription, such as strengthening, stabilization and stretching programs.  Promote optimal body mechanics to stabilize the spine with lifting and functional activity.  Encourage activity and mobility modifications to facilitate optimal function, such as using a log roll for bed mobility or dressing from a seated position.  Reinforce individual adaptive equipment recommendations to limit excessive spinal movements, such as a Systems analyst.  Assess adequacy of sleep;  encourage use of sleep hygiene techniques, such as bedtime routine; use of white noise; dark, cool bedroom; avoiding daytime naps, heavy meals or exercise before bedtime.  Promote positions and modification to optimize sleep and sexual activity; consider pillows or positioning devices to assist in maintaining neutral spine.  Explore options for applying ergonomic principles at work and home, such as frequent position changes, using ergonomically designed equipment and working at optimal height.  Promote modifications to increase comfort with driving such as lumbar support, optimizing seat and steering wheel position, using cruise control and taking frequent rest stops to stretch and walk.   Notes:        Depression Screen    10/05/2021   11:16 AM 09/29/2020    4:16 PM 04/22/2018    3:32 PM 01/22/2017   12:56 PM 07/23/2016    8:46 AM 03/30/2015    9:55 AM 03/13/2015    5:24 PM  PHQ 2/9 Scores  PHQ - 2 Score  0 0 0 0 0 0  Exception Documentation Other- indicate reason in comment box        Not completed Alzheimers dementia          Fall Risk    10/05/2021   11:19 AM 09/29/2020    4:15 PM 06/17/2018    2:49 PM 04/22/2018    3:31 PM 01/22/2017   12:56 PM  [place] in the past year? 1 1  0 No  Number falls in past yr: 1 1  0   Injury with Fall? 1 0  0   Risk for fall due to : History of fall(s);Impaired balance/gait;Impaired mobility History of fall(s);Impaired balance/gait;Impaired mobility;Impaired vision;Mental status change Impaired vision    Risk for fall due to: Comment Alzheimers dementia      Follow up Falls evaluation completed;Education provided Falls evaluation completed;Education provided;Falls prevention discussed  FALL RISK PREVENTION PERTAINING TO THE HOME:  Any stairs in or around the home? No  If so, are there any without handrails? No  Home free of loose throw rugs in walkways, pet beds, electrical cords, etc? Yes  Adequate lighting in your home to reduce  risk of falls? Yes  ASSISTIVE DEVICES UTILIZED TO PREVENT FALLS: Yes  Life alert? No  Use of a cane, walker or w/c? Yes  Grab bars in the bathroom? Yes  Shower chair or bench in shower? Yes  Elevated toilet seat or a handicapped toilet? Yes   TIMED UP AND GO:  Was the test performed? No .  Length of time to ambulate 10 feet: N/A sec.   Gait unsteady without use of assistive device, provider informed and interventions were implemented  Cognitive Function:    10/05/2021   11:19 AM 09/29/2020    4:17 PM 06/02/2017    1:36 PM 03/05/2017    9:15 AM 07/02/2016    1:02 PM  MMSE - Mini Mental State Exam  Not completed: Unable to complete Unable to complete Unable to complete    Orientation to time   0 1 1  Orientation to Place   '5 5 3  '$ Registration   0 3 3  Attention/ Calculation   0 0 0  Recall   0 0 1  Language- name 2 objects   '1 2 1  '$ Language- repeat   0 0 0  Language- follow 3 step command   '3 3 3  '$ Language- read & follow direction   1 1 0  Write a sentence   1 0 1  Copy design   0 0 0  Total score   '11 15 13        '$ Immunizations Immunization History  Administered Date(s) Administered   Influenza, High Dose Seasonal PF 01/14/2019, 01/04/2020   Influenza,inj,Quad PF,6+ Mos 12/29/2017   Influenza-Unspecified 12/01/2014, 01/11/2016, 01/04/2020, 01/24/2021   Moderna SARS-COV2 Booster Vaccination 09/06/2020   Moderna Sars-Covid-2 Vaccination 04/05/2019, 05/03/2019, 02/14/2020   PFIZER(Purple Top)SARS-COV-2 Vaccination 12/20/2020   Pfizer Covid-19 Vaccine Bivalent Booster 68yr & up 12/20/2020   Pneumococcal Conjugate-13 11/22/2013   Pneumococcal Polysaccharide-23 07/01/2006   Tdap 12/17/2011   Zoster, Live 12/25/2005    TDAP status: Up to date  Flu Vaccine status: Up to date  Pneumococcal vaccine status: Up to date  Covid-19 vaccine status: Completed vaccines  Qualifies for Shingles Vaccine? Yes   Zostavax completed Yes   Shingrix Completed?: No.    Education has  been provided regarding the importance of this vaccine. Patient has been advised to call insurance company to determine out of pocket expense if they have not yet received this vaccine. Advised may also receive vaccine at local pharmacy or Health Dept. Verbalized acceptance and understanding.  Screening Tests Health Maintenance  Topic Date Due   OPHTHALMOLOGY EXAM  Never done   Zoster Vaccines- Shingrix (1 of 2) Never done   URINE MICROALBUMIN  02/11/2017   FOOT EXAM  08/15/2021   COVID-19 Vaccine (5 - Moderna series) 01/15/2022 (Originally 02/14/2021)   INFLUENZA VACCINE  10/30/2021   TETANUS/TDAP  12/16/2021   HEMOGLOBIN A1C  12/26/2021   Pneumonia Vaccine 88 Years old  Completed   DEXA SCAN  Completed   HPV VACCINES  Aged Out    Health Maintenance  Health Maintenance Due  Topic Date Due   OPHTHALMOLOGY EXAM  Never done   Zoster Vaccines- Shingrix (1 of 2) Never done   URINE  MICROALBUMIN  02/11/2017   FOOT EXAM  08/15/2021    Colorectal cancer screening: No longer required.   Mammogram status: No longer required due to advanced age.  Bone Density status: Completed 2018- no further studies per goals of care. Results reflect: Bone density results: OSTEOPOROSIS. Repeat every 2 years.  Lung Cancer Screening: (Low Dose CT Chest recommended if Age 77-80 years, 30 pack-year currently smoking OR have quit w/in 15years.) does not qualify.   Lung Cancer Screening Referral: No  Additional Screening:  Hepatitis C Screening: does not qualify; Completed   Vision Screening: Recommended annual ophthalmology exams for early detection of glaucoma and other disorders of the eye. Is the patient up to date with their annual eye exam?  No  Who is the provider or what is the name of the office in which the patient attends annual eye exams? No If pt is not established with a provider, would they like to be referred to a provider to establish care? No .   Dental Screening: Recommended annual  dental exams for proper oral hygiene  Community Resource Referral / Chronic Care Management: CRR required this visit?  No   CCM required this visit?  No      Plan:     I have personally reviewed and noted the following in the patient's chart:   Medical and social history Use of alcohol, tobacco or illicit drugs  Current medications and supplements including opioid prescriptions.  Functional ability and status Nutritional status Physical activity Advanced directives List of other physicians Hospitalizations, surgeries, and ER visits in previous 12 months Vitals Screenings to include cognitive, depression, and falls Referrals and appointments  In addition, I have reviewed and discussed with patient certain preventive protocols, quality metrics, and best practice recommendations. A written personalized care plan for preventive services as well as general preventive health recommendations were provided to patient.     [name], NP   10/05/2021   Nurse Notes: none

## 2021-10-11 ENCOUNTER — Non-Acute Institutional Stay (SKILLED_NURSING_FACILITY): Payer: Medicare PPO | Admitting: Internal Medicine

## 2021-10-11 ENCOUNTER — Encounter: Payer: Self-pay | Admitting: Internal Medicine

## 2021-10-11 DIAGNOSIS — F0280 Dementia in other diseases classified elsewhere without behavioral disturbance: Secondary | ICD-10-CM

## 2021-10-11 DIAGNOSIS — K219 Gastro-esophageal reflux disease without esophagitis: Secondary | ICD-10-CM | POA: Diagnosis not present

## 2021-10-11 DIAGNOSIS — F339 Major depressive disorder, recurrent, unspecified: Secondary | ICD-10-CM

## 2021-10-11 DIAGNOSIS — R1319 Other dysphagia: Secondary | ICD-10-CM

## 2021-10-11 DIAGNOSIS — R634 Abnormal weight loss: Secondary | ICD-10-CM

## 2021-10-11 DIAGNOSIS — G301 Alzheimer's disease with late onset: Secondary | ICD-10-CM | POA: Diagnosis not present

## 2021-10-11 NOTE — Progress Notes (Signed)
Location:   [facility] Room Number: 3 Place of Service:  SNF [phone]) Provider:  Veleta Miners MD   [name], MD  Patient Care Team: [name], MD as PCP - General (Internal Medicine) Lindwood Coke, MD as Consulting Physician (Dermatology) Christophe Louis, MD as Consulting Physician (Obstetrics and Gynecology) Latanya Maudlin, MD as Consulting Physician (Orthopedic Surgery) Garlan Fair, MD as Consulting Physician (Gastroenterology) Barnett Abu., MD as Consulting Physician (Cardiology) Neldon Mc Donnamarie Poag, MD as Consulting Physician (Allergy and Immunology) Rexene Alberts, MD (Inactive) as Consulting Physician (Cardiothoracic Surgery) Melida Quitter, MD as Consulting Physician (Otolaryngology) Ngetich, Nelda Bucks, NP as Nurse Practitioner (Family Medicine)  Extended Emergency Contact Information Primary Emergency Contact: Kathie Rhodes of [place] Phone: [phone] Mobile Phone: [phone] Relation: Legal Guardian Secondary Emergency Contact: Leana Gamer [place] of [place] Phone: [phone] Relation: Uncle  Code Status:  DNR Managed Care Goals of care: Advanced Directive information    10/11/2021    1:33 PM  Advanced Directives  Does Patient Have a Medical Advance Directive? Yes  Type of Paramedic of [place];Out of facility DNR (pink MOST or yellow form)  Does patient want to make changes to medical advance directive? No - Patient declined  Copy of [place] in Chart? Yes - validated most recent copy scanned in chart (See row information)  Pre-existing out of facility DNR order (yellow form or pink MOST form) Yellow form placed in chart (order not valid for inpatient use);Pink MOST form placed in chart (order not valid for inpatient use)     Chief Complaint  Patient presents with   Medical Management of Chronic Issues    HPI:  Pt is a 88 y.o.  female seen today for medical management of chronic diseases.    Lives in SNF  Patient has h/o Hyperlipidemia, Osteoporosis, Allergic Rhinitis and Dementia Alzheimer, Dysphagia  s/p Dilatation 6/20 Has h/o Choking Episodes on Reglan now. Displaced Fracture of Right Femoral Neck after Mechanical fall in 08/22     She is stable. No new Nursing issues. No Behavior issues Has lost more then 5 lbs recently [employer] dependent Has aphasia Needs help with Feeding No Falls Wt Readings from Last 3 Encounters:  10/11/21 127 lb 4.8 oz (57.7 kg)  10/05/21 127 lb 4.8 oz (57.7 kg)  09/10/21 131 lb 3.2 oz (59.5 kg)   Past Medical History:  Diagnosis Date   Abnormal CT scan, chest September 2011   Scar tissue   Allergic rhinitis    Alzheimer's dementia ([place])    Asthma    Diabetes mellitus    borderline and under control-diet ,exercise   Esophageal stricture    Family history of colon cancer    Mother   GERD (gastroesophageal reflux disease)    Hiatal hernia    HLD (hyperlipidemia)    Hypertension    Osteopenia    Pneumonia 12-15 yrs.ago   yrs. ago   TIA (transient ischemic attack)    Vitamin D deficiency    Past Surgical History:  Procedure Laterality Date   ABDOMINAL HYSTERECTOMY  1980    ANTERIOR APPROACH HEMI HIP ARTHROPLASTY Right 11/29/2020   Procedure: ANTERIOR APPROACH HEMI HIP ARTHROPLASTY;  Surgeon: Rod Can, MD;  Location: [facility];  Service: Orthopedics;  Laterality: Right;   BALLOON DILATION N/A 09/27/2018   Procedure: BALLOON DILATION;  Surgeon: Jackquline Denmark, MD;  [facility]: [facility];  Service: Endoscopy;  Laterality:  N/A;   BIOPSY  09/27/2018   Procedure: BIOPSY;  Surgeon: Jackquline Denmark, MD;  [facility];  Service: Endoscopy;;   CATARACT EXTRACTION Left 2014   ESOPHAGOGASTRODUODENOSCOPY (EGD) WITH PROPOFOL N/A 09/27/2018   Procedure: ESOPHAGOGASTRODUODENOSCOPY (EGD) WITH PROPOFOL;  Surgeon: Jackquline Denmark, MD;  [facility];  Service:  Endoscopy;  Laterality: N/A;   SHOULDER OPEN ROTATOR CUFF REPAIR  05/22/2011   Procedure: ROTATOR CUFF REPAIR SHOULDER OPEN;  Surgeon: Tobi Bastos, MD;  Location: [facility];  Service: Orthopedics;  Laterality: Left;   THORACOTOMY / DECORTICATION PARIETAL PLEURA Right 204   empyema   TONSILLECTOMY      Allergies  Allergen Reactions   Aricept [Donepezil Hcl] Diarrhea   Crestor [Rosuvastatin Calcium]     Myalgia   Lipitor [Atorvastatin Calcium]    Lovastatin     Myalgia   Micardis [Telmisartan]     Fatigue   Sertraline Diarrhea   Welchol [Colesevelam Hcl]     Constipation   Zetia [Ezetimibe]     Myalgia   Zocor [Simvastatin]     Myalgia    Allergies as of 10/11/2021       Reactions   Aricept [donepezil Hcl] Diarrhea   Crestor [rosuvastatin Calcium]    Myalgia   Lipitor [atorvastatin Calcium]    Lovastatin    Myalgia   Micardis [telmisartan]    Fatigue   Sertraline Diarrhea   Welchol [colesevelam Hcl]    Constipation   Zetia [ezetimibe]    Myalgia   Zocor [simvastatin]    Myalgia        Medication List        Accurate as of October 11, 2021  1:33 PM. If you have any questions, ask your nurse or doctor.          acetaminophen 500 MG tablet Commonly known as: TYLENOL Take 2 tablets (1,000 mg total) by mouth in the morning and at bedtime.   acetaminophen 500 MG tablet Commonly known as: TYLENOL Take 2 tablets (1,000 mg total) by mouth daily as needed.   aspirin EC 81 MG tablet Take 81 mg by mouth daily. Swallow whole.   Calcium 600-200 MG-UNIT tablet Take 1 tablet by mouth daily. In the morning 7-11 am   cetirizine 10 MG tablet Commonly known as: ZYRTEC Take 10 mg by mouth daily.   CHLORHEXIDINE GLUCONATE MT Use as directed 1 Dose in the mouth or throat 2 (two) times daily. 1 tsp of solution to each teeth and gums with a toothbrush. Spit excess and do not rinse.   chlorhexidine 0.12 % solution Commonly known as: PERIDEX Use as directed in the  mouth or throat in the morning and at bedtime. Brush on 1 tsp. of solution to teeth and gums with a  toothbrush after PM mouth care. Spit out excess and do not rinse.   escitalopram 10 MG tablet Commonly known as: LEXAPRO Take 1 tablet (10 mg total) by mouth daily.   ipratropium-albuterol 0.5-2.5 (3) MG/3ML Soln Commonly known as: DUONEB Take 3 mLs by nebulization every 6 (six) hours as needed.   memantine 28 MG Cp24 24 hr capsule Commonly known as: NAMENDA XR TAKE ONE CAPSULE ONCE A DAY TO PRESERVE MEMORY.   metoCLOPramide 5 MG tablet Commonly known as: REGLAN Take 5 mg by mouth in the morning and at bedtime.   NON FORMULARY Med Pass 2.0 liquid; 2.0; amt: 180ML.; oral Special Instructions: Med Pass 2.0 180ML QD @ 2 PM. Doc. % consumed. Once a  day   omeprazole 20 MG capsule Commonly known as: PRILOSEC Take 20 mg by mouth daily. Do not crush; Open capsule and sprinkle on applesauce   PreserVision AREDS 2 Chew Chew 1 tablet by mouth in the morning and at bedtime.   PreviDent 5000 Booster Plus 1.1 % Pste Generic drug: Sodium Fluoride Place 1 application onto teeth every evening.   rivastigmine 9.5 mg/24hr Commonly known as: EXELON Apply fresh patch daily and remove old patch to help preserve memory   zinc oxide 20 % ointment Apply 1 application topically as needed for irritation. Apply to buttocks/peri topical as needed        Review of Systems  Unable to perform ROS: Dementia    Immunization History  Administered Date(s) Administered   Influenza, High Dose Seasonal PF 01/14/2019, 01/04/2020   Influenza,inj,Quad PF,6+ Mos 12/29/2017   Influenza-Unspecified 12/01/2014, 01/11/2016, 01/04/2020, 01/24/2021   Moderna SARS-COV2 Booster Vaccination 09/06/2020   Moderna Sars-Covid-2 Vaccination 04/05/2019, 05/03/2019, 02/14/2020   PFIZER(Purple Top)SARS-COV-2 Vaccination 12/20/2020   Pfizer Covid-19 Vaccine Bivalent Booster 58yr & up 12/20/2020   Pneumococcal  Conjugate-13 11/22/2013   Pneumococcal Polysaccharide-23 07/01/2006   Tdap 12/17/2011   Zoster, Live 12/25/2005   Pertinent  Health Maintenance Due  Topic Date Due   INFLUENZA VACCINE  10/30/2021   HEMOGLOBIN A1C  12/26/2021   DEXA SCAN  Completed   FOOT EXAM  Discontinued   OPHTHALMOLOGY EXAM  Discontinued   URINE MICROALBUMIN  Discontinued      12/01/2020    7:55 AM 12/01/2020    9:35 PM 12/02/2020    7:30 AM 03/17/2021    2:28 PM 10/05/2021   11:19 AM  Fall Risk  Falls in the past year?     1  Was there an injury with Fall?     1  Fall Risk Category Calculator     3  Fall Risk Category     High  Patient Fall Risk Level High fall risk High fall risk High fall risk High fall risk High fall risk  Patient at Risk for Falls Due to     History of fall(s);Impaired balance/gait;Impaired mobility  Patient at Risk for Falls Due to - Comments     Alzheimers dementia  Fall risk Follow up     Falls evaluation completed;Education provided   Functional Status Survey:    Vitals:   10/11/21 1314  BP: 106/70  Pulse: 62  Resp: 20  Temp: (!) 97.5 F (36.4 C)  SpO2: 91%  Weight: 127 lb 4.8 oz (57.7 kg)  Height: 5' (1.524 m)   Body mass index is 24.86 kg/m. Physical Exam Vitals reviewed.  Constitutional:      Appearance: Normal appearance.  HENT:     Head: Normocephalic.     Nose: Nose normal.     Mouth/Throat:     Mouth: Mucous membranes are moist.     Pharynx: Oropharynx is clear.  Eyes:     Pupils: Pupils are equal, round, and reactive to light.  Cardiovascular:     Rate and Rhythm: Normal rate and regular rhythm.     Pulses: Normal pulses.     Heart sounds: Normal heart sounds. No murmur heard. Pulmonary:     Effort: Pulmonary effort is normal.     Breath sounds: Normal breath sounds.  Abdominal:     General: Abdomen is flat. Bowel sounds are normal.     Palpations: Abdomen is soft.  Musculoskeletal:        General: No  swelling.     Cervical back: Neck supple.   Skin:    General: Skin is warm.  Neurological:     General: No focal deficit present.     Mental Status: She is alert.     Comments: Contractures in her hands  Psychiatric:        Mood and Affect: Mood normal.        Thought Content: Thought content normal.    Labs reviewed: Recent Labs    11/29/20 0435 11/30/20 0335 12/01/20 0320 12/11/20 0000 06/25/21 0000 07/11/21 0000  NA 143 139 137 141 142 141  K 3.7 3.3* 4.1 4.4 4.3 4.1  CL 103 102 103 102 106 106  CO2 '30 28 27 '$ 34* 29* 27*  GLUCOSE 144* 153* 144*  --   --   --   BUN 22 25* '17 17 15 16  '$ CREATININE 0.66 0.73 0.52 0.6 0.5 0.5  CALCIUM 9.1 8.0* 7.8* 8.6* 8.9 8.6*  MG 2.1  --   --   --   --   --    Recent Labs    12/11/20 0000 06/25/21 0000  AST 14 35  ALT 10 40*  ALKPHOS 57 59  ALBUMIN 3.1* 3.5   Recent Labs    11/30/20 0335 12/01/20 0320 12/02/20 0304 12/11/20 0000 01/15/21 0000 07/11/21 0000  WBC 19.6* 14.0* 11.8* 12.0 7.0 7.5  NEUTROABS  --   --   --  8,112.00 3,927.00 3,435.00  HGB 9.3* 8.1* 7.7* 8.5* 10.5* 10.5*  HCT 28.3* 24.9* 23.9* 27* 32* 32*  MCV 93.4 94.0 93.4  --   --   --   PLT 198 191 234 392 265 232   Lab Results  Component Value Date   TSH 3.09 06/25/2021   Lab Results  Component Value Date   HGBA1C 5.3 06/25/2021   Lab Results  Component Value Date   CHOL 275 (H) 04/06/2018   HDL 52 04/06/2018   LDLCALC 190 (H) 04/06/2018   TRIG 161 (H) 04/06/2018   CHOLHDL 5.3 (H) 04/06/2018    Significant Diagnostic Results in last 30 days:  No results found.  Assessment/Plan 1. Late onset Alzheimer's dementia without behavioral disturbance (HCC) Continue Namenda and Exelon  2. Weight loss Most likely due to Dysphagia and Cognition  3. Gastroesophageal reflux disease, unspecified whether esophagitis present On Prilosec  4. Depression, recurrent ([place]) Continue Lexapro  5. Esophageal dysphagia On Low dose of Reglan    Family/ staff Communication:   Labs/tests ordered:

## 2021-10-22 ENCOUNTER — Encounter: Payer: Self-pay | Admitting: Orthopedic Surgery

## 2021-10-22 ENCOUNTER — Non-Acute Institutional Stay (SKILLED_NURSING_FACILITY): Payer: Medicare PPO | Admitting: Orthopedic Surgery

## 2021-10-22 DIAGNOSIS — G301 Alzheimer's disease with late onset: Secondary | ICD-10-CM | POA: Diagnosis not present

## 2021-10-22 DIAGNOSIS — R634 Abnormal weight loss: Secondary | ICD-10-CM | POA: Diagnosis not present

## 2021-10-22 DIAGNOSIS — M25551 Pain in right hip: Secondary | ICD-10-CM

## 2021-10-22 DIAGNOSIS — M24549 Contracture, unspecified hand: Secondary | ICD-10-CM

## 2021-10-22 DIAGNOSIS — F0280 Dementia in other diseases classified elsewhere without behavioral disturbance: Secondary | ICD-10-CM

## 2021-10-22 DIAGNOSIS — R1319 Other dysphagia: Secondary | ICD-10-CM | POA: Diagnosis not present

## 2021-10-22 DIAGNOSIS — M25521 Pain in right elbow: Secondary | ICD-10-CM

## 2021-10-22 MED ORDER — ACETAMINOPHEN 500 MG PO TABS: 1000.0000 mg | ORAL_TABLET | Freq: Three times a day (TID) | ORAL | 0 refills | Status: DC

## 2021-10-22 NOTE — Progress Notes (Signed)
Location:  [facility] Room Number: NO/03/A Place of Service:  SNF [phone]) Provider:  Windell Moulding, NP [name], MD  Patient Care Team: [name], MD as PCP - General (Internal Medicine) Lindwood Coke, MD as Consulting Physician (Dermatology) Christophe Louis, MD as Consulting Physician (Obstetrics and Gynecology) Latanya Maudlin, MD as Consulting Physician (Orthopedic Surgery) Garlan Fair, MD as Consulting Physician (Gastroenterology) Barnett Abu., MD as Consulting Physician (Cardiology) Neldon Mc Donnamarie Poag, MD as Consulting Physician (Allergy and Immunology) Rexene Alberts, MD (Inactive) as Consulting Physician (Cardiothoracic Surgery) Melida Quitter, MD as Consulting Physician (Otolaryngology) Ngetich, Nelda Bucks, NP as Nurse Practitioner (Family Medicine)  Extended Emergency Contact Information Primary Emergency Contact: Kathie Rhodes of [place] Phone: [phone] Mobile Phone: [phone] Relation: Legal Guardian Secondary Emergency Contact: Leana Gamer States of [place] Phone: [phone] Relation: Uncle  Code Status:  DNR Goals of care: Advanced Directive information    10/22/2021   10:46 AM  Advanced Directives  Does Patient Have a Medical Advance Directive? Yes  Type of Paramedic of [place];Out of facility DNR (pink MOST or yellow form)  Does patient want to make changes to medical advance directive? No - Patient declined  Copy of [place] in Chart? Yes - validated most recent copy scanned in chart (See row information)  Pre-existing out of facility DNR order (yellow form or pink MOST form) Yellow form placed in chart (order not valid for inpatient use);Pink MOST form placed in chart (order not valid for inpatient use)     Chief Complaint  Patient presents with   Acute Visit    Patient is being seen for hand contracture     HPI:  Pt is a 88  y.o. female seen today for an acute visit for worsening hand contracture.   She currently resides on the skilled nursing unit at [facility]. Past medical history includes: hypertension, HLD, dysphagia, GERD, Alzheimer's disease, osteoporosis, thrombocytosis, h/o right femoral neck fracture 10/2020, unsteady gait and frequent falls   OT reports worsening bilateral hand contractures. She is wearing hand splints at night without success. She is unable to grab objects or eating utensils. Staff assists with all feedings. OT has concerns her upper extremities and neck are now becoming more stiff and rigid. Poor historian due to dementia. She is on scheduled tylenol for pain.   Weight loss- TSH 3.09 06/25/2021, see weights below, sleeping more during mealtime, does well at lunch, remains on med pass  Alzheimer's- no recent behavioral outbursts, dependent with all ADLs- including feedings, sleeping more during day, aphasia- talking less, ambulates in w/c after hip fracture 10/2020, remains on Namenda and Exelon  Dysphagia- no recent aspirations, remains on pureed diet with nectar thick fluids  Recent weights:  07/19- 129.5 lbs  07/05- 127.3 lbs  06/28- 126.3 lbs    Past Medical History:  Diagnosis Date   Abnormal CT scan, chest September 2011   Scar tissue   Allergic rhinitis    Alzheimer's dementia ([place])    Asthma    Diabetes mellitus    borderline and under control-diet ,exercise   Esophageal stricture    Family history of colon cancer    Mother   GERD (gastroesophageal reflux disease)    Hiatal hernia    HLD (hyperlipidemia)    Hypertension    Osteopenia    Pneumonia 12-15 yrs.ago   yrs. ago   TIA (transient ischemic attack)  Vitamin D deficiency    Past Surgical History:  Procedure Laterality Date   ABDOMINAL HYSTERECTOMY  1980    ANTERIOR APPROACH HEMI HIP ARTHROPLASTY Right 11/29/2020   Procedure: ANTERIOR APPROACH HEMI HIP ARTHROPLASTY;  Surgeon: Rod Can, MD;   [facility];  Service: Orthopedics;  Laterality: Right;   BALLOON DILATION N/A 09/27/2018   Procedure: BALLOON DILATION;  Surgeon: Jackquline Denmark, MD;  [facility];  Service: Endoscopy;  Laterality: N/A;   BIOPSY  09/27/2018   Procedure: BIOPSY;  Surgeon: Jackquline Denmark, MD;  [facility];  Service: Endoscopy;;   CATARACT EXTRACTION Left 2014   ESOPHAGOGASTRODUODENOSCOPY (EGD) WITH PROPOFOL N/A 09/27/2018   Procedure: ESOPHAGOGASTRODUODENOSCOPY (EGD) WITH PROPOFOL;  Surgeon: Jackquline Denmark, MD;  [facility];  Service: Endoscopy;  Laterality: N/A;   SHOULDER OPEN ROTATOR CUFF REPAIR  05/22/2011   Procedure: ROTATOR CUFF REPAIR SHOULDER OPEN;  Surgeon: Tobi Bastos, MD;  [facility];  Service: Orthopedics;  Laterality: Left;   THORACOTOMY / DECORTICATION PARIETAL PLEURA Right 204   empyema   TONSILLECTOMY      Allergies  Allergen Reactions   Aricept [Donepezil Hcl] Diarrhea   Crestor [Rosuvastatin Calcium]     Myalgia   Lipitor [Atorvastatin Calcium]    Lovastatin     Myalgia   Micardis [Telmisartan]     Fatigue   Sertraline Diarrhea   Welchol [Colesevelam Hcl]     Constipation   Zetia [Ezetimibe]     Myalgia   Zocor [Simvastatin]     Myalgia    Outpatient Encounter Medications as of 10/22/2021  Medication Sig   acetaminophen (TYLENOL) 500 MG tablet Take 2 tablets (1,000 mg total) by mouth in the morning and at bedtime.   acetaminophen (TYLENOL) 500 MG tablet Take 2 tablets (1,000 mg total) by mouth daily as needed.   aspirin EC 81 MG tablet Take 81 mg by mouth daily. Swallow whole.   Calcium 600-200 MG-UNIT tablet Take 1 tablet by mouth daily. In the morning 7-11 am   cetirizine (ZYRTEC) 10 MG tablet Take 10 mg by mouth daily.   chlorhexidine (PERIDEX) 0.12 % solution Use as directed in the mouth or throat in the morning and at bedtime. Brush on 1 tsp. of solution to teeth and gums with a  toothbrush after PM mouth care. Spit out excess  and do not rinse.   CHLORHEXIDINE GLUCONATE MT Use as directed 1 Dose in the mouth or throat 2 (two) times daily. 1 tsp of solution to each teeth and gums with a toothbrush. Spit excess and do not rinse.   escitalopram (LEXAPRO) 10 MG tablet Take 1 tablet (10 mg total) by mouth daily.   ipratropium-albuterol (DUONEB) 0.5-2.5 (3) MG/3ML SOLN Take 3 mLs by nebulization every 6 (six) hours as needed.   memantine (NAMENDA XR) 28 MG CP24 24 hr capsule TAKE ONE CAPSULE ONCE A DAY TO PRESERVE MEMORY.   metoCLOPramide (REGLAN) 5 MG tablet Take 5 mg by mouth in the morning and at bedtime.   Multiple Vitamins-Minerals (PRESERVISION AREDS 2) CHEW Chew 1 tablet by mouth in the morning and at bedtime.   NON FORMULARY Med Pass 2.0 liquid; 2.0; amt: 180ML.; oral Special Instructions: Med Pass 2.0 180ML QD @ 2 PM. Doc. % consumed. Once a day   omeprazole (PRILOSEC) 20 MG capsule Take 20 mg by mouth daily. Do not crush; Open capsule and sprinkle on applesauce   rivastigmine (EXELON) 9.5 mg/24hr Apply fresh patch daily and remove old patch  to help preserve memory   Sodium Fluoride (PREVIDENT 5000 BOOSTER PLUS) 1.1 % PSTE Place 1 application onto teeth every evening.   zinc oxide 20 % ointment Apply 1 application topically as needed for irritation. Apply to buttocks/peri topical as needed   No facility-administered encounter medications on file as of 10/22/2021.    Review of Systems  Unable to perform ROS: Dementia    Immunization History  Administered Date(s) Administered   Influenza, High Dose Seasonal PF 01/14/2019, 01/04/2020   Influenza,inj,Quad PF,6+ Mos 12/29/2017   Influenza-Unspecified 12/01/2014, 01/11/2016, 01/04/2020, 01/24/2021   Moderna SARS-COV2 Booster Vaccination 09/06/2020   Moderna Sars-Covid-2 Vaccination 04/05/2019, 05/03/2019, 02/14/2020   PFIZER(Purple Top)SARS-COV-2 Vaccination 12/20/2020   Pfizer Covid-19 Vaccine Bivalent Booster 60yr & up 12/20/2020   Pneumococcal Conjugate-13  11/22/2013   Pneumococcal Polysaccharide-23 07/01/2006   Tdap 12/17/2011   Zoster, Live 12/25/2005   Pertinent  Health Maintenance Due  Topic Date Due   INFLUENZA VACCINE  10/30/2021   HEMOGLOBIN A1C  12/26/2021   DEXA SCAN  Completed   FOOT EXAM  Discontinued   OPHTHALMOLOGY EXAM  Discontinued   URINE MICROALBUMIN  Discontinued      12/01/2020    7:55 AM 12/01/2020    9:35 PM 12/02/2020    7:30 AM 03/17/2021    2:28 PM 10/05/2021   11:19 AM  Fall Risk  Falls in the past year?     1  Was there an injury with Fall?     1  Fall Risk Category Calculator     3  Fall Risk Category     High  Patient Fall Risk Level High fall risk High fall risk High fall risk High fall risk High fall risk  Patient at Risk for Falls Due to     History of fall(s);Impaired balance/gait;Impaired mobility  Patient at Risk for Falls Due to - Comments     Alzheimers dementia  Fall risk Follow up     Falls evaluation completed;Education provided   Functional Status Survey:    Vitals:   10/22/21 1049  BP: 132/72  Pulse: 61  Resp: 18  SpO2: 94%  Weight: 129 lb (58.5 kg)  Height: 5' (1.524 m)   Body mass index is 25.19 kg/m. Physical Exam Vitals reviewed.  Constitutional:      General: She is not in acute distress. HENT:     Head: Normocephalic.  Eyes:     General:        Right eye: No discharge.        Left eye: No discharge.  Neck:     Comments: Neck mildly tilted to right side Cardiovascular:     Rate and Rhythm: Normal rate and regular rhythm.     Pulses: Normal pulses.     Heart sounds: Normal heart sounds.  Pulmonary:     Effort: Pulmonary effort is normal. No respiratory distress.     Breath sounds: Normal breath sounds. No wheezing.  Abdominal:     General: Bowel sounds are normal. There is no distension.     Palpations: Abdomen is soft.     Tenderness: There is no abdominal tenderness.  Musculoskeletal:     Right elbow: Decreased range of motion.     Left elbow: Decreased range  of motion.     Right hand: Tenderness present. Decreased range of motion.     Left hand: Tenderness present. Decreased range of motion.     Cervical back: Rigidity present. Decreased range of motion.  Right lower leg: No edema.     Left lower leg: No edema.     Comments: Elbow flexion 90 degrees, cannot perform extension, unable to open hands   Skin:    General: Skin is warm and dry.     Capillary Refill: Capillary refill takes less than 2 seconds.  Neurological:     General: No focal deficit present.     Mental Status: She is alert. Mental status is at baseline.     Motor: Weakness present.     Gait: Gait abnormal.     Comments: Rigidity to upper extremities  Psychiatric:        Mood and Affect: Mood normal.        Cognition and Memory: Cognition is impaired. Memory is impaired.     Comments: Does not follow commands, alert to self     Labs reviewed: Recent Labs    11/29/20 0435 11/30/20 0335 12/01/20 0320 12/11/20 0000 06/25/21 0000 07/11/21 0000  NA 143 139 137 141 142 141  K 3.7 3.3* 4.1 4.4 4.3 4.1  CL 103 102 103 102 106 106  CO2 '30 28 27 '$ 34* 29* 27*  GLUCOSE 144* 153* 144*  --   --   --   BUN 22 25* '17 17 15 16  '$ CREATININE 0.66 0.73 0.52 0.6 0.5 0.5  CALCIUM 9.1 8.0* 7.8* 8.6* 8.9 8.6*  MG 2.1  --   --   --   --   --    Recent Labs    12/11/20 0000 06/25/21 0000  AST 14 35  ALT 10 40*  ALKPHOS 57 59  ALBUMIN 3.1* 3.5   Recent Labs    11/30/20 0335 12/01/20 0320 12/02/20 0304 12/11/20 0000 01/15/21 0000 07/11/21 0000  WBC 19.6* 14.0* 11.8* 12.0 7.0 7.5  NEUTROABS  --   --   --  8,112.00 3,927.00 3,435.00  HGB 9.3* 8.1* 7.7* 8.5* 10.5* 10.5*  HCT 28.3* 24.9* 23.9* 27* 32* 32*  MCV 93.4 94.0 93.4  --   --   --   PLT 198 191 234 392 265 232   Lab Results  Component Value Date   TSH 3.09 06/25/2021   Lab Results  Component Value Date   HGBA1C 5.3 06/25/2021   Lab Results  Component Value Date   CHOL 275 (H) 04/06/2018   HDL 52  04/06/2018   LDLCALC 190 (H) 04/06/2018   TRIG 161 (H) 04/06/2018   CHOLHDL 5.3 (H) 04/06/2018    Significant Diagnostic Results in last 30 days:  No results found.  Assessment/Plan 1. Contracture of hand - followed by OT - splinting at night without success - rigidity to upper arms developing - increase tylenol to 1000 mg po TID for pain - do not recommend muscle relaxants due to sedative SE/best practice  2. Weight loss - stable - cont monthly weights  3. Late onset Alzheimer's dementia without behavioral disturbance (HCC) - no behaviors - dependent with all ADLs including feeding - rigidity noted to upper extremities and neck - ambulates with wheelchair with staff assistance - aphasia- talking less - cont Namenda and Exelon patch  4. Esophageal dysphagia - no recent aspirations - cont Reglan and pureed foods    Family/ staff Communication: plan discussed with nurse  Labs/tests ordered:  none

## 2021-10-25 LAB — COMPREHENSIVE METABOLIC PANEL
Albumin: 3.7 (ref 3.5–5.0)
Calcium: 9.2 (ref 8.7–10.7)
Globulin: 2.7
eGFR: 93

## 2021-10-25 LAB — BASIC METABOLIC PANEL
BUN: 14 (ref 4–21)
CO2: 31 — AB (ref 13–22)
Chloride: 103 (ref 99–108)
Creatinine: 0.5 (ref 0.5–1.1)
Glucose: 83
Potassium: 4.4 mEq/L (ref 3.5–5.1)
Sodium: 142 (ref 137–147)

## 2021-10-25 LAB — HEPATIC FUNCTION PANEL
ALT: 21 U/L (ref 7–35)
AST: 20 (ref 13–35)
Alkaline Phosphatase: 65 (ref 25–125)
Bilirubin, Total: 0.9

## 2021-11-02 ENCOUNTER — Non-Acute Institutional Stay (SKILLED_NURSING_FACILITY): Payer: Medicare PPO | Admitting: Orthopedic Surgery

## 2021-11-02 ENCOUNTER — Encounter: Payer: Self-pay | Admitting: Orthopedic Surgery

## 2021-11-02 DIAGNOSIS — M24549 Contracture, unspecified hand: Secondary | ICD-10-CM | POA: Diagnosis not present

## 2021-11-02 DIAGNOSIS — R634 Abnormal weight loss: Secondary | ICD-10-CM | POA: Diagnosis not present

## 2021-11-02 DIAGNOSIS — R1319 Other dysphagia: Secondary | ICD-10-CM | POA: Diagnosis not present

## 2021-11-02 DIAGNOSIS — F0280 Dementia in other diseases classified elsewhere without behavioral disturbance: Secondary | ICD-10-CM

## 2021-11-02 DIAGNOSIS — G301 Alzheimer's disease with late onset: Secondary | ICD-10-CM

## 2021-11-02 DIAGNOSIS — F339 Major depressive disorder, recurrent, unspecified: Secondary | ICD-10-CM

## 2021-11-02 DIAGNOSIS — R7303 Prediabetes: Secondary | ICD-10-CM

## 2021-11-02 DIAGNOSIS — K219 Gastro-esophageal reflux disease without esophagitis: Secondary | ICD-10-CM

## 2021-11-02 DIAGNOSIS — M810 Age-related osteoporosis without current pathological fracture: Secondary | ICD-10-CM

## 2021-11-02 DIAGNOSIS — D649 Anemia, unspecified: Secondary | ICD-10-CM

## 2021-11-02 NOTE — Progress Notes (Signed)
Location:  [facility] Room Number: 3 Place of Service:  SNF [phone]) Provider:  Windell Moulding, NP  [name], MD  Patient Care Team: [name], MD as PCP - General (Internal Medicine) Lindwood Coke, MD as Consulting Physician (Dermatology) Christophe Louis, MD as Consulting Physician (Obstetrics and Gynecology) Latanya Maudlin, MD as Consulting Physician (Orthopedic Surgery) Garlan Fair, MD as Consulting Physician (Gastroenterology) Barnett Abu., MD as Consulting Physician (Cardiology) Neldon Mc Donnamarie Poag, MD as Consulting Physician (Allergy and Immunology) Rexene Alberts, MD (Inactive) as Consulting Physician (Cardiothoracic Surgery) Melida Quitter, MD as Consulting Physician (Otolaryngology) Ngetich, Nelda Bucks, NP as Nurse Practitioner (Family Medicine)  Extended Emergency Contact Information Primary Emergency Contact: Kathie Rhodes of [place] Phone: [phone] Mobile Phone: [phone] Relation: Legal Guardian Secondary Emergency Contact: Leana Gamer [place] of [place] Phone: [phone] Relation: Uncle  Code Status:  DNR Goals of care: Advanced Directive information    11/02/2021   10:25 AM  Advanced Directives  Does Patient Have a Medical Advance Directive? Yes  Type of Paramedic of [place];Living will;Out of facility DNR (pink MOST or yellow form)  Does patient want to make changes to medical advance directive? No - Patient declined  Copy of [place] in Chart? Yes - validated most recent copy scanned in chart (See row information)  Pre-existing out of facility DNR order (yellow form or pink MOST form) Pink MOST form placed in chart (order not valid for inpatient use);Yellow form placed in chart (order not valid for inpatient use)     Chief Complaint  Patient presents with   Medical Management of Chronic Issues    Routine follow up    HPI:  Pt is  a 88 y.o. female seen today for medical management of chronic diseases.    She currently resides on the skilled nursing unit at [facility]. Past medical history includes: hypertension, HLD, dysphagia, GERD, Alzheimer's disease, osteoporosis, thrombocytosis, h/o right femoral neck fracture 10/2020, unsteady gait and frequent falls  Weight loss- TSH 3.09 06/25/2021, see weights below, sleeping more during mealtime, does well at lunch, remains on med pass  Alzheimer's- no recent behavioral outbursts, dependent with all ADLs- including feedings, sleeping more during day, nonverbal today- she will say a few words, ambulates in w/c after hip fracture 10/2020, remains on Namenda and Exelon  Dysphagia- no recent aspirations, remains on pureed diet with nectar thick fluids Contracture of hands- involves both hands, unable to hold feeding utensil, tylenol increased for pain, wears splints qhs Prediabetes- A1c 5.3 06/25/2021, not on medication Anemia- Hgb 10.5 07/11/2021 GERD- hgb 10.5 07/11/2021, remains on Prilosec Depression- no mood changes, remains on Lexapro Osteoporosis- DEXA 2018 t score -2.6, did poorly with Fosamax, remains on calcium    No recent falls or injuries.   Recent blood pressures:  08/01- 140/59  07/25- 124/70  07/18- 132/72  Recent weights:  08/01- 130 lbs  07/05- 127.3 lbs  06/07- 131.2 lbs    Past Medical History:  Diagnosis Date   Abnormal CT scan, chest September 2011   Scar tissue   Allergic rhinitis    Alzheimer's dementia ([place])    Asthma    Diabetes mellitus    borderline and under control-diet ,exercise   Esophageal stricture    Family history of colon cancer    Mother   GERD (gastroesophageal reflux disease)    Hiatal hernia    HLD (hyperlipidemia)  Hypertension    Osteopenia    Pneumonia 12-15 yrs.ago   yrs. ago   TIA (transient ischemic attack)    Vitamin D deficiency    Past Surgical History:  Procedure Laterality Date   ABDOMINAL  HYSTERECTOMY  1980    ANTERIOR APPROACH HEMI HIP ARTHROPLASTY Right 11/29/2020   Procedure: ANTERIOR APPROACH HEMI HIP ARTHROPLASTY;  Surgeon: Rod Can, MD;  [facility]: [facility];  Service: Orthopedics;  Laterality: Right;   BALLOON DILATION N/A 09/27/2018   Procedure: BALLOON DILATION;  Surgeon: Jackquline Denmark, MD;  [facility];  Service: Endoscopy;  Laterality: N/A;   BIOPSY  09/27/2018   Procedure: BIOPSY;  Surgeon: Jackquline Denmark, MD;  [facility];  Service: Endoscopy;;   CATARACT EXTRACTION Left 2014   ESOPHAGOGASTRODUODENOSCOPY (EGD) WITH PROPOFOL N/A 09/27/2018   Procedure: ESOPHAGOGASTRODUODENOSCOPY (EGD) WITH PROPOFOL;  Surgeon: Jackquline Denmark, MD;  [facility];  Service: Endoscopy;  Laterality: N/A;   SHOULDER OPEN ROTATOR CUFF REPAIR  05/22/2011   Procedure: ROTATOR CUFF REPAIR SHOULDER OPEN;  Surgeon: Tobi Bastos, MD;  [facility]: [facility];  Service: Orthopedics;  Laterality: Left;   THORACOTOMY / DECORTICATION PARIETAL PLEURA Right 204   empyema   TONSILLECTOMY      Allergies  Allergen Reactions   Aricept [Donepezil Hcl] Diarrhea   Crestor [Rosuvastatin Calcium]     Myalgia   Lipitor [Atorvastatin Calcium]    Lovastatin     Myalgia   Micardis [Telmisartan]     Fatigue   Sertraline Diarrhea   Welchol [Colesevelam Hcl]     Constipation   Zetia [Ezetimibe]     Myalgia   Zocor [Simvastatin]     Myalgia    Allergies as of 11/02/2021       Reactions   Aricept [donepezil Hcl] Diarrhea   Crestor [rosuvastatin Calcium]    Myalgia   Lipitor [atorvastatin Calcium]    Lovastatin    Myalgia   Micardis [telmisartan]    Fatigue   Sertraline Diarrhea   Welchol [colesevelam Hcl]    Constipation   Zetia [ezetimibe]    Myalgia   Zocor [simvastatin]    Myalgia        Medication List        Accurate as of November 02, 2021 10:31 AM. If you have any questions, ask your nurse or doctor.          acetaminophen 500 MG tablet Commonly  known as: TYLENOL Take 2 tablets (1,000 mg total) by mouth 3 (three) times daily.   aspirin EC 81 MG tablet Take 81 mg by mouth daily. Swallow whole.   Calcium 600-200 MG-UNIT tablet Take 1 tablet by mouth daily. In the morning 7-11 am   cetirizine 10 MG tablet Commonly known as: ZYRTEC Take 10 mg by mouth daily.   chlorhexidine 0.12 % solution Commonly known as: PERIDEX Use as directed in the mouth or throat in the morning and at bedtime. Brush on 1 tsp. of solution to teeth and gums with a  toothbrush after PM mouth care. Spit out excess and do not rinse.   escitalopram 10 MG tablet Commonly known as: LEXAPRO Take 1 tablet (10 mg total) by mouth daily.   ipratropium-albuterol 0.5-2.5 (3) MG/3ML Soln Commonly known as: DUONEB Take 3 mLs by nebulization every 6 (six) hours as needed.   memantine 28 MG Cp24 24 hr capsule Commonly known as: NAMENDA XR TAKE ONE CAPSULE ONCE A DAY TO PRESERVE MEMORY.   metoCLOPramide 5 MG tablet Commonly known  as: REGLAN Take 5 mg by mouth in the morning and at bedtime.   NON FORMULARY Med Pass 2.0 liquid; 2.0; amt: 180ML.; oral Special Instructions: Med Pass 2.0 180ML QD @ 2 PM. Doc. % consumed. Once a day   omeprazole 20 MG capsule Commonly known as: PRILOSEC Take 20 mg by mouth daily. Do not crush; Open capsule and sprinkle on applesauce   PreserVision AREDS 2 Chew Chew 1 tablet by mouth in the morning and at bedtime.   PreviDent 5000 Booster Plus 1.1 % Pste Generic drug: Sodium Fluoride Place 1 application onto teeth every evening.   rivastigmine 9.5 mg/24hr Commonly known as: EXELON Apply fresh patch daily and remove old patch to help preserve memory   zinc oxide 20 % ointment Apply 1 application topically as needed for irritation. Apply to buttocks/peri topical as needed        Review of Systems  Unable to perform ROS: Dementia    Immunization History  Administered Date(s) Administered   Influenza, High Dose  Seasonal PF 01/14/2019, 01/04/2020   Influenza,inj,Quad PF,6+ Mos 12/29/2017   Influenza-Unspecified 12/01/2014, 01/11/2016, 01/04/2020, 01/24/2021   Moderna SARS-COV2 Booster Vaccination 09/06/2020   Moderna Sars-Covid-2 Vaccination 04/05/2019, 05/03/2019, 02/14/2020   PFIZER(Purple Top)SARS-COV-2 Vaccination 12/20/2020   Pfizer Covid-19 Vaccine Bivalent Booster 75yr & up 12/20/2020   Pneumococcal Conjugate-13 11/22/2013   Pneumococcal Polysaccharide-23 07/01/2006   Tdap 12/17/2011   Zoster, Live 12/25/2005   Pertinent  Health Maintenance Due  Topic Date Due   INFLUENZA VACCINE  10/30/2021   HEMOGLOBIN A1C  12/26/2021   DEXA SCAN  Completed   FOOT EXAM  Discontinued   OPHTHALMOLOGY EXAM  Discontinued   URINE MICROALBUMIN  Discontinued      12/01/2020    7:55 AM 12/01/2020    9:35 PM 12/02/2020    7:30 AM 03/17/2021    2:28 PM 10/05/2021   11:19 AM  Fall Risk  Falls in the past year?     1  Was there an injury with Fall?     1  Fall Risk Category Calculator     3  Fall Risk Category     High  Patient Fall Risk Level High fall risk High fall risk High fall risk High fall risk High fall risk  Patient at Risk for Falls Due to     History of fall(s);Impaired balance/gait;Impaired mobility  Patient at Risk for Falls Due to - Comments     Alzheimers dementia  Fall risk Follow up     Falls evaluation completed;Education provided   Functional Status Survey:    Vitals:   11/02/21 1021  BP: (!) 140/59  Pulse: 74  Resp: 16  Temp: (!) 96.4 F (35.8 C)  SpO2: 96%  Weight: 130 lb (59 kg)  Height: 5' (1.524 m)   Body mass index is 25.39 kg/m. Physical Exam Vitals reviewed.  Constitutional:      General: She is not in acute distress. HENT:     Head: Normocephalic.     Right Ear: There is no impacted cerumen.     Left Ear: There is no impacted cerumen.     Nose: Nose normal.     Mouth/Throat:     Mouth: Mucous membranes are moist.  Eyes:     General:        Right eye: No  discharge.        Left eye: No discharge.  Cardiovascular:     Rate and Rhythm: Normal rate and regular rhythm.  Pulses: Normal pulses.     Heart sounds: Normal heart sounds.  Pulmonary:     Effort: Pulmonary effort is normal.     Breath sounds: Normal breath sounds.  Abdominal:     General: Bowel sounds are normal. There is no distension.     Palpations: Abdomen is soft.     Tenderness: There is no abdominal tenderness.  Musculoskeletal:     Cervical back: Neck supple.     Right lower leg: No edema.     Left lower leg: No edema.  Skin:    General: Skin is warm and dry.     Capillary Refill: Capillary refill takes less than 2 seconds.  Neurological:     General: No focal deficit present.     Mental Status: She is alert. Mental status is at baseline.     Motor: Weakness present.     Gait: Gait abnormal.  Psychiatric:        Mood and Affect: Mood normal.        Behavior: Behavior normal.     Comments: Nonverbal, follows some commands, alert to self     Labs reviewed: Recent Labs    11/29/20 0435 11/30/20 0335 12/01/20 0320 12/11/20 0000 06/25/21 0000 07/11/21 0000  NA 143 139 137 141 142 141  K 3.7 3.3* 4.1 4.4 4.3 4.1  CL 103 102 103 102 106 106  CO2 '30 28 27 '$ 34* 29* 27*  GLUCOSE 144* 153* 144*  --   --   --   BUN 22 25* '17 17 15 16  '$ CREATININE 0.66 0.73 0.52 0.6 0.5 0.5  CALCIUM 9.1 8.0* 7.8* 8.6* 8.9 8.6*  MG 2.1  --   --   --   --   --    Recent Labs    12/11/20 0000 06/25/21 0000  AST 14 35  ALT 10 40*  ALKPHOS 57 59  ALBUMIN 3.1* 3.5   Recent Labs    11/30/20 0335 12/01/20 0320 12/02/20 0304 12/11/20 0000 01/15/21 0000 07/11/21 0000  WBC 19.6* 14.0* 11.8* 12.0 7.0 7.5  NEUTROABS  --   --   --  8,112.00 3,927.00 3,435.00  HGB 9.3* 8.1* 7.7* 8.5* 10.5* 10.5*  HCT 28.3* 24.9* 23.9* 27* 32* 32*  MCV 93.4 94.0 93.4  --   --   --   PLT 198 191 234 392 265 232   Lab Results  Component Value Date   TSH 3.09 06/25/2021   Lab Results   Component Value Date   HGBA1C 5.3 06/25/2021   Lab Results  Component Value Date   CHOL 275 (H) 04/06/2018   HDL 52 04/06/2018   LDLCALC 190 (H) 04/06/2018   TRIG 161 (H) 04/06/2018   CHOLHDL 5.3 (H) 04/06/2018    Significant Diagnostic Results in last 30 days:  No results found.  Assessment/Plan 1. Weight loss - stable this month - cont med pass - cont monthly weights  2. Late onset Alzheimer's dementia without behavioral disturbance (H[facility]) - no behaviors - dependent with all ADLs including feeding - cont Namenda and Exelon  3. Esophageal dysphagia - no recent aspirations - cont pureed diet  4. Contracture of hand - ongoing - tylenol recently increased for pain - cont OT - cont splinting qhs  5. Prediabetes - A1c stable  6. Anemia, unspecified type - hgb stable  7. Gastroesophageal reflux disease, unspecified whether esophagitis present - cont Prilosec  8. Depression, recurrent ([place]) - no mood changes - cont Lexapro  9. Age-related osteoporosis without current pathological fracture - DEXA 2018, no further studies - cont calcium     Family/ staff Communication: plan discussed with patient and nurse  Labs/tests ordered: none

## 2021-12-04 ENCOUNTER — Encounter: Payer: Self-pay | Admitting: Orthopedic Surgery

## 2021-12-04 ENCOUNTER — Non-Acute Institutional Stay (SKILLED_NURSING_FACILITY): Payer: Medicare PPO | Admitting: Orthopedic Surgery

## 2021-12-04 DIAGNOSIS — H6123 Impacted cerumen, bilateral: Secondary | ICD-10-CM

## 2021-12-04 DIAGNOSIS — R7303 Prediabetes: Secondary | ICD-10-CM

## 2021-12-04 DIAGNOSIS — F339 Major depressive disorder, recurrent, unspecified: Secondary | ICD-10-CM

## 2021-12-04 DIAGNOSIS — R1319 Other dysphagia: Secondary | ICD-10-CM

## 2021-12-04 DIAGNOSIS — F0280 Dementia in other diseases classified elsewhere without behavioral disturbance: Secondary | ICD-10-CM

## 2021-12-04 DIAGNOSIS — R634 Abnormal weight loss: Secondary | ICD-10-CM

## 2021-12-04 DIAGNOSIS — G301 Alzheimer's disease with late onset: Secondary | ICD-10-CM

## 2021-12-04 DIAGNOSIS — M24549 Contracture, unspecified hand: Secondary | ICD-10-CM

## 2021-12-04 MED ORDER — DEBROX 6.5 % OT SOLN: 5.0000 [drp] | Freq: Two times a day (BID) | OTIC | 0 refills | Status: AC

## 2021-12-04 NOTE — Progress Notes (Signed)
Location:  [facility] Room Number: 3/A Place of Service:  SNF [phone]) Provider:  [name], NP   [name], MD  Patient Care Team: [name], MD as PCP - General (Internal Medicine) Lindwood Coke, MD as Consulting Physician (Dermatology) Christophe Louis, MD as Consulting Physician (Obstetrics and Gynecology) Latanya Maudlin, MD as Consulting Physician (Orthopedic Surgery) Garlan Fair, MD as Consulting Physician (Gastroenterology) Barnett Abu., MD as Consulting Physician (Cardiology) Neldon Mc Donnamarie Poag, MD as Consulting Physician (Allergy and Immunology) Rexene Alberts, MD (Inactive) as Consulting Physician (Cardiothoracic Surgery) Melida Quitter, MD as Consulting Physician (Otolaryngology) Ngetich, Nelda Bucks, NP as Nurse Practitioner (Family Medicine)  Extended Emergency Contact Information Primary Emergency Contact: Kathie Rhodes of [place] Phone: [phone] Mobile Phone: [phone] Relation: Legal Guardian Secondary Emergency Contact: Leana Gamer [place] of [place] Phone: [phone] Relation: Uncle  Code Status:  DNR Goals of care: Advanced Directive information    11/02/2021   10:25 AM  Advanced Directives  Does Patient Have a Medical Advance Directive? Yes  Type of Paramedic of [place];Living will;Out of facility DNR (pink MOST or yellow form)  Does patient want to make changes to medical advance directive? No - Patient declined  Copy of [place] in Chart? Yes - validated most recent copy scanned in chart (See row information)  Pre-existing out of facility DNR order (yellow form or pink MOST form) Pink MOST form placed in chart (order not valid for inpatient use);Yellow form placed in chart (order not valid for inpatient use)     Chief Complaint  Patient presents with   Medical Management of Chronic Issues         HPI:  Pt is a 87 y.o.  female seen today for medical management of chronic diseases.    She currently resides on the skilled nursing unit at [facility]. Past medical history includes: hypertension, HLD, dysphagia, GERD, Alzheimer's disease, osteoporosis, thrombocytosis, h/o right femoral neck fracture 10/2020, unsteady gait and frequent falls   Weight loss- TSH 3.09 06/25/2021, see weights below, eating 50-75% most meals, remains on med pass  Alzheimer's- no recent behavioral outbursts, dependent with all ADLs- including feedings, sleeping more, aphasia, ambulates in w/c after hip fracture 10/2020, remains on Namenda and Exelon  Dysphagia- no recent aspirations, remains on pureed diet with nectar thick fluids Contracture of hands- involves both hands, unable to hold feeding utensil, tylenol increased for pain, wears splints qhs Prediabetes- A1c 5.3 06/25/2021, not on medication Depression- no mood changes, Na+142 10/25/2021, remains on Lexapro  No recent falls or injuries. 2 person sit/stand transfer.   Recent blood pressures:  08/29- 166/78  08/22- 118/68  08/15- 130/66  Recent weights:  08/30- 128.7 lbs  08/01- 130 lbs  07/05- 127 lbs     Past Medical History:  Diagnosis Date   Abnormal CT scan, chest September 2011   Scar tissue   Allergic rhinitis    Alzheimer's dementia ([place])    Asthma    Diabetes mellitus    borderline and under control-diet ,exercise   Esophageal stricture    Family history of colon cancer    Mother   GERD (gastroesophageal reflux disease)    Hiatal hernia    HLD (hyperlipidemia)    Hypertension    Osteopenia    Pneumonia 12-15 yrs.ago   yrs. ago   TIA (transient ischemic attack)    Vitamin D deficiency  Past Surgical History:  Procedure Laterality Date   ABDOMINAL HYSTERECTOMY  1980    ANTERIOR APPROACH HEMI HIP ARTHROPLASTY Right 11/29/2020   Procedure: ANTERIOR APPROACH HEMI HIP ARTHROPLASTY;  Surgeon: Rod Can, MD;  [facility];  Service:  Orthopedics;  Laterality: Right;   BALLOON DILATION N/A 09/27/2018   Procedure: BALLOON DILATION;  Surgeon: Jackquline Denmark, MD;  [facility];  Service: Endoscopy;  Laterality: N/A;   BIOPSY  09/27/2018   Procedure: BIOPSY;  Surgeon: Jackquline Denmark, MD;  [facility];  Service: Endoscopy;;   CATARACT EXTRACTION Left 2014   ESOPHAGOGASTRODUODENOSCOPY (EGD) WITH PROPOFOL N/A 09/27/2018   Procedure: ESOPHAGOGASTRODUODENOSCOPY (EGD) WITH PROPOFOL;  Surgeon: Jackquline Denmark, MD;  [facility];  Service: Endoscopy;  Laterality: N/A;   SHOULDER OPEN ROTATOR CUFF REPAIR  05/22/2011   Procedure: ROTATOR CUFF REPAIR SHOULDER OPEN;  Surgeon: Tobi Bastos, MD;  [facility];  Service: Orthopedics;  Laterality: Left;   THORACOTOMY / DECORTICATION PARIETAL PLEURA Right 204   empyema   TONSILLECTOMY      Allergies  Allergen Reactions   Aricept [Donepezil Hcl] Diarrhea   Crestor [Rosuvastatin Calcium]     Myalgia   Lipitor [Atorvastatin Calcium]    Lovastatin     Myalgia   Micardis [Telmisartan]     Fatigue   Sertraline Diarrhea   Welchol [Colesevelam Hcl]     Constipation   Zetia [Ezetimibe]     Myalgia   Zocor [Simvastatin]     Myalgia    Outpatient Encounter Medications as of 12/04/2021  Medication Sig   acetaminophen (TYLENOL) 500 MG tablet Take 2 tablets (1,000 mg total) by mouth 3 (three) times daily.   aspirin EC 81 MG tablet Take 81 mg by mouth daily. Swallow whole.   Calcium 600-200 MG-UNIT tablet Take 1 tablet by mouth daily. In the morning 7-11 am   cetirizine (ZYRTEC) 10 MG tablet Take 10 mg by mouth daily.   chlorhexidine (PERIDEX) 0.12 % solution Use as directed in the mouth or throat in the morning and at bedtime. Brush on 1 tsp. of solution to teeth and gums with a  toothbrush after PM mouth care. Spit out excess and do not rinse.   escitalopram (LEXAPRO) 10 MG tablet Take 1 tablet (10 mg total) by mouth daily.   ipratropium-albuterol (DUONEB)  0.5-2.5 (3) MG/3ML SOLN Take 3 mLs by nebulization every 6 (six) hours as needed.   memantine (NAMENDA XR) 28 MG CP24 24 hr capsule TAKE ONE CAPSULE ONCE A DAY TO PRESERVE MEMORY.   metoCLOPramide (REGLAN) 5 MG tablet Take 5 mg by mouth in the morning and at bedtime.   Multiple Vitamins-Minerals (PRESERVISION AREDS 2) CHEW Chew 1 tablet by mouth in the morning and at bedtime.   NON FORMULARY Med Pass 2.0 liquid; 2.0; amt: 180ML.; oral Special Instructions: Med Pass 2.0 180ML QD @ 2 PM. Doc. % consumed. Once a day   omeprazole (PRILOSEC) 20 MG capsule Take 20 mg by mouth daily. Do not crush; Open capsule and sprinkle on applesauce   rivastigmine (EXELON) 9.5 mg/24hr Apply fresh patch daily and remove old patch to help preserve memory   Sodium Fluoride (PREVIDENT 5000 BOOSTER PLUS) 1.1 % PSTE Place 1 application onto teeth every evening.   zinc oxide 20 % ointment Apply 1 application topically as needed for irritation. Apply to buttocks/peri topical as needed   No facility-administered encounter medications on file as of 12/04/2021.    Review of Systems  Unable to  perform ROS: Dementia    Immunization History  Administered Date(s) Administered   Influenza, High Dose Seasonal PF 01/14/2019, 01/04/2020   Influenza,inj,Quad PF,6+ Mos 12/29/2017   Influenza-Unspecified 12/01/2014, 01/11/2016, 01/04/2020, 01/24/2021   Moderna SARS-COV2 Booster Vaccination 09/06/2020   Moderna Sars-Covid-2 Vaccination 04/05/2019, 05/03/2019, 02/14/2020   PFIZER(Purple Top)SARS-COV-2 Vaccination 12/20/2020   Pfizer Covid-19 Vaccine Bivalent Booster 68yr & up 12/20/2020   Pneumococcal Conjugate-13 11/22/2013   Pneumococcal Polysaccharide-23 07/01/2006   Tdap 12/17/2011   Zoster, Live 12/25/2005   Pertinent  Health Maintenance Due  Topic Date Due   INFLUENZA VACCINE  10/30/2021   HEMOGLOBIN A1C  12/26/2021   DEXA SCAN  Completed   FOOT EXAM  Discontinued   OPHTHALMOLOGY EXAM  Discontinued   URINE  MICROALBUMIN  Discontinued      12/01/2020    7:55 AM 12/01/2020    9:35 PM 12/02/2020    7:30 AM 03/17/2021    2:28 PM 10/05/2021   11:19 AM  Fall Risk  Falls in the past year?     1  Was there an injury with Fall?     1  Fall Risk Category Calculator     3  Fall Risk Category     High  Patient Fall Risk Level High fall risk High fall risk High fall risk High fall risk High fall risk  Patient at Risk for Falls Due to     History of fall(s);Impaired balance/gait;Impaired mobility  Patient at Risk for Falls Due to - Comments     Alzheimers dementia  Fall risk Follow up     Falls evaluation completed;Education provided   Functional Status Survey:    Vitals:   12/04/21 1251  Pulse: 74  Resp: (!) 22  Temp: (!) 96.4 F (35.8 C)  SpO2: 96%  Weight: 128 lb 11.2 oz (58.4 kg)  Height: 5' (1.524 m)   Body mass index is 25.13 kg/m. Physical Exam Vitals reviewed.  Constitutional:      General: She is not in acute distress. HENT:     Head: Normocephalic.     Right Ear: There is impacted cerumen.     Left Ear: There is impacted cerumen.     Nose: Nose normal.     Mouth/Throat:     Mouth: Mucous membranes are moist.  Eyes:     General:        Right eye: No discharge.        Left eye: No discharge.  Cardiovascular:     Rate and Rhythm: Normal rate and regular rhythm.     Pulses: Normal pulses.     Heart sounds: Normal heart sounds.  Pulmonary:     Effort: Pulmonary effort is normal. No respiratory distress.     Breath sounds: Normal breath sounds. No wheezing.  Abdominal:     General: Bowel sounds are normal. There is no distension.     Palpations: Abdomen is soft.     Tenderness: There is no abdominal tenderness.  Musculoskeletal:     Cervical back: Neck supple.     Right lower leg: No edema.     Left lower leg: No edema.     Comments: Bilateral hand contracture  Skin:    General: Skin is warm and dry.     Capillary Refill: Capillary refill takes less than 2 seconds.   Neurological:     General: No focal deficit present.     Mental Status: She is alert. Mental status is at baseline.  Motor: Weakness present.     Gait: Gait abnormal.     Comments: wheelchair  Psychiatric:        Mood and Affect: Mood normal.        Behavior: Behavior normal.     Comments: Very pleasant, aphasia, does not follow commands     Labs reviewed: Recent Labs    12/11/20 0000 06/25/21 0000 07/11/21 0000  NA 141 142 141  K 4.4 4.3 4.1  CL 102 106 106  CO2 34* 29* 27*  BUN '17 15 16  '$ CREATININE 0.6 0.5 0.5  CALCIUM 8.6* 8.9 8.6*   Recent Labs    12/11/20 0000 06/25/21 0000  AST 14 35  ALT 10 40*  ALKPHOS 57 59  ALBUMIN 3.1* 3.5   Recent Labs    12/11/20 0000 01/15/21 0000 07/11/21 0000  WBC 12.0 7.0 7.5  NEUTROABS 8,112.00 3,927.00 3,435.00  HGB 8.5* 10.5* 10.5*  HCT 27* 32* 32*  PLT 392 265 232   Lab Results  Component Value Date   TSH 3.09 06/25/2021   Lab Results  Component Value Date   HGBA1C 5.3 06/25/2021   Lab Results  Component Value Date   CHOL 275 (H) 04/06/2018   HDL 52 04/06/2018   LDLCALC 190 (H) 04/06/2018   TRIG 161 (H) 04/06/2018   CHOLHDL 5.3 (H) 04/06/2018    Significant Diagnostic Results in last 30 days:  No results found.  Assessment/Plan 1. Bilateral impacted cerumen - cannot visualize TM due to cerumen - start debrox 5 gtts to both ears BID x 5 days - flush ears with warm water when debrox complete  2. Weight loss - weights stable - eating 50-75% most meals - cont med pass  3. Late onset Alzheimer's dementia without behavioral disturbance ([place]) - no behaviors - dependent for all ADLs - ambulates with wheelchair - cont Namenda and Exelon  4. Esophageal dysphagia - no aspirations - cont pureed diet/nectar thick fluids  5. Contracture of hand - cont splinting qhs  6. Prediabetes - A1c 5.3 05/2021  7. Depression, recurrent ([place]) - no mood changes  - cont Lexapro    Family/ staff  Communication: plan discussed with patient and nurse  Labs/tests ordered:  none

## 2022-01-07 ENCOUNTER — Encounter: Payer: Self-pay | Admitting: Orthopedic Surgery

## 2022-01-07 ENCOUNTER — Non-Acute Institutional Stay (SKILLED_NURSING_FACILITY): Payer: Medicare PPO | Admitting: Orthopedic Surgery

## 2022-01-07 DIAGNOSIS — G301 Alzheimer's disease with late onset: Secondary | ICD-10-CM | POA: Diagnosis not present

## 2022-01-07 DIAGNOSIS — R627 Adult failure to thrive: Secondary | ICD-10-CM | POA: Diagnosis not present

## 2022-01-07 DIAGNOSIS — R1319 Other dysphagia: Secondary | ICD-10-CM | POA: Diagnosis not present

## 2022-01-07 DIAGNOSIS — F0280 Dementia in other diseases classified elsewhere without behavioral disturbance: Secondary | ICD-10-CM

## 2022-01-07 DIAGNOSIS — R051 Acute cough: Secondary | ICD-10-CM

## 2022-01-07 NOTE — Progress Notes (Signed)
Location:  [place] Room Number: 3/A Place of Service:  SNF [id number]) Provider:  [name], NP   [name], MD  Patient Care Team: [name], MD as PCP - General (Internal Medicine) Lindwood Coke, MD as Consulting Physician (Dermatology) Christophe Louis, MD as Consulting Physician (Obstetrics and Gynecology) Latanya Maudlin, MD as Consulting Physician (Orthopedic Surgery) Garlan Fair, MD as Consulting Physician (Gastroenterology) Barnett Abu., MD as Consulting Physician (Cardiology) Neldon Mc Donnamarie Poag, MD as Consulting Physician (Allergy and Immunology) Rexene Alberts, MD (Inactive) as Consulting Physician (Cardiothoracic Surgery) Melida Quitter, MD as Consulting Physician (Otolaryngology) Ngetich, Nelda Bucks, NP as Nurse Practitioner (Family Medicine)  Extended Emergency Contact Information Primary Emergency Contact: Kathie Rhodes of [place] Phone: [phone] Mobile Phone: [phone] Relation: Legal Guardian Secondary Emergency Contact: Leana Gamer [place] of [place] Phone: [phone] Relation: Uncle  Code Status:  DNR Goals of care: Advanced Directive information    11/02/2021   10:25 AM  Advanced Directives  Does Patient Have a Medical Advance Directive? Yes  Type of Paramedic of [place];Living will;Out of facility DNR (pink MOST or yellow form)  Does patient want to make changes to medical advance directive? No - Patient declined  Copy of [place] in Chart? Yes - validated most recent copy scanned in chart (See row information)  Pre-existing out of facility DNR order (yellow form or pink MOST form) Pink MOST form placed in chart (order not valid for inpatient use);Yellow form placed in chart (order not valid for inpatient use)     Chief Complaint  Patient presents with   Acute Visit    Cough     HPI:  Pt is a 88 y.o. female seen today for  acute visit due to cough.   She currently resides on the skilled nursing unit at [facility]. Past medical history includes: hypertension, HLD, dysphagia, GERD, Alzheimer's disease, osteoporosis, thrombocytosis, h/o right femoral neck fracture 10/2020, unsteady gait and frequent falls  10/06 nursing concerned she aspirated during lunchtime. She was suctioned. On call provider ordered CXR and duonebs. 10/06 CXR negative for mass, consolidation or effusion. Chronic chest findings present. She is a poor historian due to late stage Alzheimer's dementia. She has a wet cough during our encounter. She is on 4 liters oxygen, sats > 95%.   Her overall health has declined since right hip fracture last year. At this time she is dependent with all ADLs, including feeding. Bed bound requiring hoyer for transfer. Eating less with progressive weight loss. See weight trends below. Concerns discussed with [name]/ HPOA. She would like to consult hospice.   Recent weights:  10/02- 125.8 lbs   08/30- 128.7 lbs              08/01- 130 lbs               Past Medical History:  Diagnosis Date   Abnormal CT scan, chest September 2011   Scar tissue   Allergic rhinitis    Alzheimer's dementia ([place])    Asthma    Diabetes mellitus    borderline and under control-diet ,exercise   Esophageal stricture    Family history of colon cancer    Mother   GERD (gastroesophageal reflux disease)    Hiatal hernia    HLD (hyperlipidemia)    Hypertension    Osteopenia    Pneumonia 12-15 yrs.ago   yrs. ago  TIA (transient ischemic attack)    Vitamin D deficiency    Past Surgical History:  Procedure Laterality Date   ABDOMINAL HYSTERECTOMY  1980    ANTERIOR APPROACH HEMI HIP ARTHROPLASTY Right 11/29/2020   Procedure: ANTERIOR APPROACH HEMI HIP ARTHROPLASTY;  Surgeon: Rod Can, MD;  [facility]: [facility];  Service: Orthopedics;  Laterality: Right;   BALLOON DILATION N/A 09/27/2018   Procedure: BALLOON DILATION;   Surgeon: Jackquline Denmark, MD;  [facility];  Service: Endoscopy;  Laterality: N/A;   BIOPSY  09/27/2018   Procedure: BIOPSY;  Surgeon: Jackquline Denmark, MD;  [facility];  Service: Endoscopy;;   CATARACT EXTRACTION Left 2014   ESOPHAGOGASTRODUODENOSCOPY (EGD) WITH PROPOFOL N/A 09/27/2018   Procedure: ESOPHAGOGASTRODUODENOSCOPY (EGD) WITH PROPOFOL;  Surgeon: Jackquline Denmark, MD;  [facility];  Service: Endoscopy;  Laterality: N/A;   SHOULDER OPEN ROTATOR CUFF REPAIR  05/22/2011   Procedure: ROTATOR CUFF REPAIR SHOULDER OPEN;  Surgeon: Tobi Bastos, MD;  [facility];  Service: Orthopedics;  Laterality: Left;   THORACOTOMY / DECORTICATION PARIETAL PLEURA Right 204   empyema   TONSILLECTOMY      Allergies  Allergen Reactions   Aricept [Donepezil Hcl] Diarrhea   Crestor [Rosuvastatin Calcium]     Myalgia   Lipitor [Atorvastatin Calcium]    Lovastatin     Myalgia   Micardis [Telmisartan]     Fatigue   Sertraline Diarrhea   Welchol [Colesevelam Hcl]     Constipation   Zetia [Ezetimibe]     Myalgia   Zocor [Simvastatin]     Myalgia    Outpatient Encounter Medications as of 01/07/2022  Medication Sig   acetaminophen (TYLENOL) 500 MG tablet Take 2 tablets (1,000 mg total) by mouth 3 (three) times daily.   aspirin EC 81 MG tablet Take 81 mg by mouth daily. Swallow whole.   Calcium 600-200 MG-UNIT tablet Take 1 tablet by mouth daily. In the morning 7-11 am   cetirizine (ZYRTEC) 10 MG tablet Take 10 mg by mouth daily.   chlorhexidine (PERIDEX) 0.12 % solution Use as directed in the mouth or throat in the morning and at bedtime. Brush on 1 tsp. of solution to teeth and gums with a  toothbrush after PM mouth care. Spit out excess and do not rinse.   escitalopram (LEXAPRO) 10 MG tablet Take 1 tablet (10 mg total) by mouth daily.   ipratropium-albuterol (DUONEB) 0.5-2.5 (3) MG/3ML SOLN Take 3 mLs by nebulization every 6 (six) hours as needed.   memantine  (NAMENDA XR) 28 MG CP24 24 hr capsule TAKE ONE CAPSULE ONCE A DAY TO PRESERVE MEMORY.   metoCLOPramide (REGLAN) 5 MG tablet Take 5 mg by mouth in the morning and at bedtime.   Multiple Vitamins-Minerals (PRESERVISION AREDS 2) CHEW Chew 1 tablet by mouth in the morning and at bedtime.   NON FORMULARY Med Pass 2.0 liquid; 2.0; amt: 180ML.; oral Special Instructions: Med Pass 2.0 180ML QD @ 2 PM. Doc. % consumed. Once a day   omeprazole (PRILOSEC) 20 MG capsule Take 20 mg by mouth daily. Do not crush; Open capsule and sprinkle on applesauce   rivastigmine (EXELON) 9.5 mg/24hr Apply fresh patch daily and remove old patch to help preserve memory   Sodium Fluoride (PREVIDENT 5000 BOOSTER PLUS) 1.1 % PSTE Place 1 application onto teeth every evening.   zinc oxide 20 % ointment Apply 1 application topically as needed for irritation. Apply to buttocks/peri topical as needed   No facility-administered encounter medications on  file as of 01/07/2022.    Review of Systems  Unable to perform ROS: Dementia    Immunization History  Administered Date(s) Administered   Influenza, High Dose Seasonal PF 01/14/2019, 01/04/2020   Influenza,inj,Quad PF,6+ Mos 12/29/2017   Influenza-Unspecified 12/01/2014, 01/11/2016, 01/04/2020, 01/24/2021   Moderna SARS-COV2 Booster Vaccination 09/06/2020   Moderna Sars-Covid-2 Vaccination 04/05/2019, 05/03/2019, 02/14/2020   PFIZER(Purple Top)SARS-COV-2 Vaccination 12/20/2020   Pfizer Covid-19 Vaccine Bivalent Booster 43yr & up 12/20/2020   Pneumococcal Conjugate-13 11/22/2013   Pneumococcal Polysaccharide-23 07/01/2006   Tdap 12/17/2011   Zoster, Live 12/25/2005   Pertinent  Health Maintenance Due  Topic Date Due   INFLUENZA VACCINE  10/30/2021   HEMOGLOBIN A1C  12/26/2021   DEXA SCAN  Completed   FOOT EXAM  Discontinued   OPHTHALMOLOGY EXAM  Discontinued      12/01/2020    7:55 AM 12/01/2020    9:35 PM 12/02/2020    7:30 AM 03/17/2021    2:28 PM 10/05/2021   11:19  AM  Fall Risk  Falls in the past year?     1  Was there an injury with Fall?     1  Fall Risk Category Calculator     3  Fall Risk Category     High  Patient Fall Risk Level High fall risk High fall risk High fall risk High fall risk High fall risk  Patient at Risk for Falls Due to     History of fall(s);Impaired balance/gait;Impaired mobility  Patient at Risk for Falls Due to - Comments     Alzheimers dementia  Fall risk Follow up     Falls evaluation completed;Education provided   Functional Status Survey:    Vitals:   01/07/22 1247  BP: 139/74  Pulse: 66  Resp: 20  Temp: (!) 97 F (36.1 C)  SpO2: 96%  Weight: 125 lb 12.8 oz (57.1 kg)  Height: 5' (1.524 m)   Body mass index is 24.57 kg/m. Physical Exam Vitals reviewed.  Constitutional:      General: She is not in acute distress. HENT:     Head: Normocephalic.  Eyes:     General:        Right eye: No discharge.        Left eye: No discharge.  Cardiovascular:     Rate and Rhythm: Normal rate and regular rhythm.     Pulses: Normal pulses.     Heart sounds: Normal heart sounds.  Pulmonary:     Effort: Pulmonary effort is normal. No respiratory distress.     Breath sounds: Rhonchi present. No wheezing or rales.  Abdominal:     General: Bowel sounds are normal. There is no distension.     Palpations: Abdomen is soft.     Tenderness: There is no abdominal tenderness.  Musculoskeletal:     Cervical back: Neck supple.     Right lower leg: No edema.     Left lower leg: No edema.  Skin:    General: Skin is dry.  Neurological:     General: No focal deficit present.     Mental Status: She is alert and easily aroused. Mental status is at baseline.     Motor: Weakness present.     Gait: Gait abnormal.     Comments: hoyer  Psychiatric:     Comments: Non verbal, does not follow commands, alert to self only     Labs reviewed: Recent Labs    06/25/21 0000 07/11/21 0000  NA  142 141  K 4.3 4.1  CL 106 106  CO2  29* 27*  BUN 15 16  CREATININE 0.5 0.5  CALCIUM 8.9 8.6*   Recent Labs    06/25/21 0000  AST 35  ALT 40*  ALKPHOS 59  ALBUMIN 3.5   Recent Labs    01/15/21 0000 07/11/21 0000  WBC 7.0 7.5  NEUTROABS 3,927.00 3,435.00  HGB 10.5* 10.5*  HCT 32* 32*  PLT 265 232   Lab Results  Component Value Date   TSH 3.09 06/25/2021   Lab Results  Component Value Date   HGBA1C 5.3 06/25/2021   Lab Results  Component Value Date   CHOL 275 (H) 04/06/2018   HDL 52 04/06/2018   LDLCALC 190 (H) 04/06/2018   TRIG 161 (H) 04/06/2018   CHOLHDL 5.3 (H) 04/06/2018    Significant Diagnostic Results in last 30 days:  No results found.  Assessment/Plan 1. Acute cough - 10/06 suspected aspiration, suctioned by nursing - CXR negative for mass, consolidation or effusion, chronic changes present - rhonchi to upper lobes, wet cough observed, afebrile - cont duonebs q8 hrs prn x 1 month - cont oxygen prn  2. Adult failure to thrive - progressive weight loss, losing 1-2 lbs per month - dependent with all ADLs including feeding  - cont Boost - hospice consult per HPOA  3. Late onset Alzheimer's dementia without behavioral disturbance (HCC) - no behaviors  - nonverbal - see above  4. Esophageal dysphagia - see above - cont pureed foods/ nectar thick fluids    Family/ staff Communication: plan discussed with patient and nurse  Labs/tests ordered:  Hospice consult

## 2022-01-10 ENCOUNTER — Encounter: Payer: Self-pay | Admitting: Internal Medicine

## 2022-01-10 ENCOUNTER — Non-Acute Institutional Stay (SKILLED_NURSING_FACILITY): Admitting: Internal Medicine

## 2022-01-10 DIAGNOSIS — R0902 Hypoxemia: Secondary | ICD-10-CM | POA: Diagnosis not present

## 2022-01-10 DIAGNOSIS — R627 Adult failure to thrive: Secondary | ICD-10-CM | POA: Diagnosis not present

## 2022-01-10 DIAGNOSIS — G301 Alzheimer's disease with late onset: Secondary | ICD-10-CM

## 2022-01-10 DIAGNOSIS — F0280 Dementia in other diseases classified elsewhere without behavioral disturbance: Secondary | ICD-10-CM

## 2022-01-10 NOTE — Progress Notes (Signed)
Location:  [facility] [place] Room Number: 03/A Place of Service:  SNF [phone]) Provider:  [name], MD  Patient Care Team: [name], MD as PCP - General (Internal Medicine) Lindwood Coke, MD as Consulting Physician (Dermatology) Christophe Louis, MD as Consulting Physician (Obstetrics and Gynecology) Latanya Maudlin, MD as Consulting Physician (Orthopedic Surgery) Garlan Fair, MD as Consulting Physician (Gastroenterology) Barnett Abu., MD as Consulting Physician (Cardiology) Neldon Mc Donnamarie Poag, MD as Consulting Physician (Allergy and Immunology) Rexene Alberts, MD (Inactive) as Consulting Physician (Cardiothoracic Surgery) Melida Quitter, MD as Consulting Physician (Otolaryngology) Ngetich, Nelda Bucks, NP as Nurse Practitioner (Family Medicine)  Extended Emergency Contact Information Primary Emergency Contact: Kathie Rhodes of [place] Phone: [phone] Mobile Phone: [phone] Relation: Legal Guardian Secondary Emergency Contact: Leana Gamer [place] of [place] Phone: [phone] Relation: Uncle  Code Status:  DNR  Managed Care Goals of care: Advanced Directive information    01/10/2022   12:09 PM  Advanced Directives  Does Patient Have a Medical Advance Directive? Yes  Type of Paramedic of [place];Out of facility DNR (pink MOST or yellow form)  Does patient want to make changes to medical advance directive? No - Patient declined  Pre-existing out of facility DNR order (yellow form or pink MOST form) Pink MOST form placed in chart (order not valid for inpatient use);Yellow form placed in chart (order not valid for inpatient use)     Chief Complaint  Patient presents with   Acute Visit   Medical Management of Chronic Issues    Routine    HPI:  Pt is a 88 y.o. female seen today for medical management of chronic diseases.    Lives in SNF    Patient has h/o Hyperlipidemia, Osteoporosis, Allergic Rhinitis and Dementia Alzheimer, Dysphagia  s/p Dilatation 6/20 Has h/o Choking Episodes on Reglan now. Displaced Fracture of Right Femoral Neck after Mechanical fall in 08/22  Patient had aspiration episode Few days ago and since then she is more lethargic not swallowing And hypoxic Hospice is planning to discontinue all her meds and start her on Po Roxanol and Ativan Patient lethargic and Moaning Also on Oxygen  Past Medical History:  Diagnosis Date   Abnormal CT scan, chest September 2011   Scar tissue   Allergic rhinitis    Alzheimer's dementia ([place])    Asthma    Diabetes mellitus    borderline and under control-diet ,exercise   Esophageal stricture    Family history of colon cancer    Mother   GERD (gastroesophageal reflux disease)    Hiatal hernia    HLD (hyperlipidemia)    Hypertension    Osteopenia    Pneumonia 12-15 yrs.ago   yrs. ago   TIA (transient ischemic attack)    Vitamin D deficiency    Past Surgical History:  Procedure Laterality Date   ABDOMINAL HYSTERECTOMY  1980    ANTERIOR APPROACH HEMI HIP ARTHROPLASTY Right 11/29/2020   Procedure: ANTERIOR APPROACH HEMI HIP ARTHROPLASTY;  Surgeon: Rod Can, MD;  [facility];  Service: Orthopedics;  Laterality: Right;   BALLOON DILATION N/A 09/27/2018   Procedure: BALLOON DILATION;  Surgeon: Jackquline Denmark, MD;  [facility];  Service: Endoscopy;  Laterality: N/A;   BIOPSY  09/27/2018   Procedure: BIOPSY;  Surgeon: Jackquline Denmark, MD;  [facility];  Service: Endoscopy;;   CATARACT EXTRACTION Left 2014   ESOPHAGOGASTRODUODENOSCOPY (EGD) WITH PROPOFOL N/A  09/27/2018   Procedure: ESOPHAGOGASTRODUODENOSCOPY (EGD) WITH PROPOFOL;  Surgeon: Jackquline Denmark, MD;  [facility];  Service: Endoscopy;  Laterality: N/A;   SHOULDER OPEN ROTATOR CUFF REPAIR  05/22/2011   Procedure: ROTATOR CUFF REPAIR SHOULDER OPEN;  Surgeon: Tobi Bastos, MD;  [facility];  Service: Orthopedics;  Laterality: Left;   THORACOTOMY / DECORTICATION PARIETAL PLEURA Right 204   empyema   TONSILLECTOMY      Allergies  Allergen Reactions   Aricept [Donepezil Hcl] Diarrhea   Crestor [Rosuvastatin Calcium]     Myalgia   Lipitor [Atorvastatin Calcium]    Lovastatin     Myalgia   Micardis [Telmisartan]     Fatigue   Sertraline Diarrhea   Welchol [Colesevelam Hcl]     Constipation   Zetia [Ezetimibe]     Myalgia   Zocor [Simvastatin]     Myalgia    Allergies as of 01/10/2022       Reactions   Aricept [donepezil Hcl] Diarrhea   Crestor [rosuvastatin Calcium]    Myalgia   Lipitor [atorvastatin Calcium]    Lovastatin    Myalgia   Micardis [telmisartan]    Fatigue   Sertraline Diarrhea   Welchol [colesevelam Hcl]    Constipation   Zetia [ezetimibe]    Myalgia   Zocor [simvastatin]    Myalgia        Medication List        Accurate as of January 10, 2022 12:21 PM. If you have any questions, ask your nurse or doctor.          acetaminophen 500 MG tablet Commonly known as: TYLENOL Take 2 tablets (1,000 mg total) by mouth 3 (three) times daily.   aspirin EC 81 MG tablet Take 81 mg by mouth daily. Swallow whole.   Calcium 600-200 MG-UNIT tablet Take 1 tablet by mouth daily. In the morning 7-11 am   cetirizine 10 MG tablet Commonly known as: ZYRTEC Take 10 mg by mouth daily.   chlorhexidine 0.12 % solution Commonly known as: PERIDEX Use as directed in the mouth or throat in the morning and at bedtime. Brush on 1 tsp. of solution to teeth and gums with a  toothbrush after PM mouth care. Spit out excess and do not rinse.   escitalopram 10 MG tablet Commonly known as: LEXAPRO Take 1 tablet (10 mg total) by mouth daily.   ipratropium-albuterol 0.5-2.5 (3) MG/3ML Soln Commonly known as: DUONEB Take 3 mLs by nebulization 3 (three) times daily as needed.   memantine 28 MG Cp24 24 hr capsule Commonly  known as: NAMENDA XR TAKE ONE CAPSULE ONCE A DAY TO PRESERVE MEMORY.   metoCLOPramide 5 MG tablet Commonly known as: REGLAN Take 5 mg by mouth in the morning and at bedtime.   NON FORMULARY Med Pass 2.0 liquid; 2.0; amt: 180ML.; oral Special Instructions: Med Pass 2.0 180ML QD @ 2 PM. Doc. % consumed. Once a day   omeprazole 20 MG capsule Commonly known as: PRILOSEC Take 20 mg by mouth daily. Do not crush; Open capsule and sprinkle on applesauce   PreserVision AREDS 2 Chew Chew 1 tablet by mouth in the morning and at bedtime.   PreviDent 5000 Booster Plus 1.1 % Pste Generic drug: Sodium Fluoride Place 1 application onto teeth every evening.   rivastigmine 9.5 mg/24hr Commonly known as: EXELON Apply fresh patch daily and remove old patch to help preserve memory   zinc oxide 20 % ointment Apply 1 application topically as  needed for irritation. Apply to buttocks/peri topical as needed        Review of Systems  Unable to perform ROS: Dementia    Immunization History  Administered Date(s) Administered   Influenza, High Dose Seasonal PF 01/14/2019, 01/04/2020   Influenza,inj,Quad PF,6+ Mos 12/29/2017   Influenza-Unspecified 12/01/2014, 01/11/2016, 01/04/2020, 01/24/2021   Moderna SARS-COV2 Booster Vaccination 09/06/2020   Moderna Sars-Covid-2 Vaccination 04/05/2019, 05/03/2019, 02/14/2020, 09/12/2021   PFIZER(Purple Top)SARS-COV-2 Vaccination 12/20/2020   Pfizer Covid-19 Vaccine Bivalent Booster 21yr & up 12/20/2020   Pneumococcal Conjugate-13 11/22/2013   Pneumococcal Polysaccharide-23 07/01/2006   Tdap 12/17/2011   Zoster, Live 12/25/2005   Pertinent  Health Maintenance Due  Topic Date Due   INFLUENZA VACCINE  10/30/2021   HEMOGLOBIN A1C  12/26/2021   DEXA SCAN  Completed   FOOT EXAM  Discontinued   OPHTHALMOLOGY EXAM  Discontinued      12/01/2020    7:55 AM 12/01/2020    9:35 PM 12/02/2020    7:30 AM 03/17/2021    2:28 PM 10/05/2021   11:19 AM  Fall Risk   Falls in the past year?     1  Was there an injury with Fall?     1  Fall Risk Category Calculator     3  Fall Risk Category     High  Patient Fall Risk Level High fall risk High fall risk High fall risk High fall risk High fall risk  Patient at Risk for Falls Due to     History of fall(s);Impaired balance/gait;Impaired mobility  Patient at Risk for Falls Due to - Comments     Alzheimers dementia  Fall risk Follow up     Falls evaluation completed;Education provided   Functional Status Survey:    Vitals:   01/10/22 1155  BP: 138/71  Pulse: 72  Resp: 20  Temp: (!) 96.6 F (35.9 C)  SpO2: 96%  Weight: 125 lb 6.4 oz (56.9 kg)  Height: 5' (1.524 m)   Body mass index is 24.49 kg/m. Physical Exam Vitals reviewed.  Constitutional:      Appearance: She is ill-appearing.  HENT:     Head: Normocephalic.     Nose: Nose normal.     Mouth/Throat:     Mouth: Mucous membranes are moist.     Pharynx: Oropharynx is clear.  Eyes:     Pupils: Pupils are equal, round, and reactive to light.  Cardiovascular:     Rate and Rhythm: Normal rate and regular rhythm.     Pulses: Normal pulses.     Heart sounds: Normal heart sounds. No murmur heard. Pulmonary:     Effort: Pulmonary effort is normal.     Breath sounds: Rales present.  Abdominal:     General: Abdomen is flat. Bowel sounds are normal.     Palpations: Abdomen is soft.  Musculoskeletal:        General: No swelling.     Cervical back: Neck supple.  Skin:    General: Skin is warm.  Psychiatric:        Mood and Affect: Mood normal.        Thought Content: Thought content normal.     Labs reviewed: Recent Labs    06/25/21 0000 07/11/21 0000 10/25/21 0000  NA 142 141 142  K 4.3 4.1 4.4  CL 106 106 103  CO2 29* 27* 31*  BUN '15 16 14  '$ CREATININE 0.5 0.5 0.5  CALCIUM 8.9 8.6* 9.2   Recent Labs  06/25/21 0000 10/25/21 0000  AST 35 20  ALT 40* 21  ALKPHOS 59 65  ALBUMIN 3.5 3.7   Recent Labs     01/15/21 0000 07/11/21 0000  WBC 7.0 7.5  NEUTROABS 3,927.00 3,435.00  HGB 10.5* 10.5*  HCT 32* 32*  PLT 265 232   Lab Results  Component Value Date   TSH 3.09 06/25/2021   Lab Results  Component Value Date   HGBA1C 5.3 06/25/2021   Lab Results  Component Value Date   CHOL 275 (H) 04/06/2018   HDL 52 04/06/2018   LDLCALC 190 (H) 04/06/2018   TRIG 161 (H) 04/06/2018   CHOLHDL 5.3 (H) 04/06/2018    Significant Diagnostic Results in last 30 days:  No results found.  Assessment/Plan Patient now end stage  Agree with hospice  Discontinue all her PO meds Started on Roxanol and Haldol per them for agitation   Family/ staff Communication:   Labs/tests ordered:

## 2022-01-11 ENCOUNTER — Encounter: Payer: Self-pay | Admitting: Internal Medicine

## 2022-01-30 DEATH — deceased

## 2022-08-09 IMAGING — CR DG CHEST 1V
1 series · 1 of 1 positions shown · non-contrast
Comparison: Radiograph 12/01/2020, chest CT 09/24/2018

CLINICAL DATA: Fall, continued pain

EXAM:
CHEST  1 VIEW

[x chest ap]
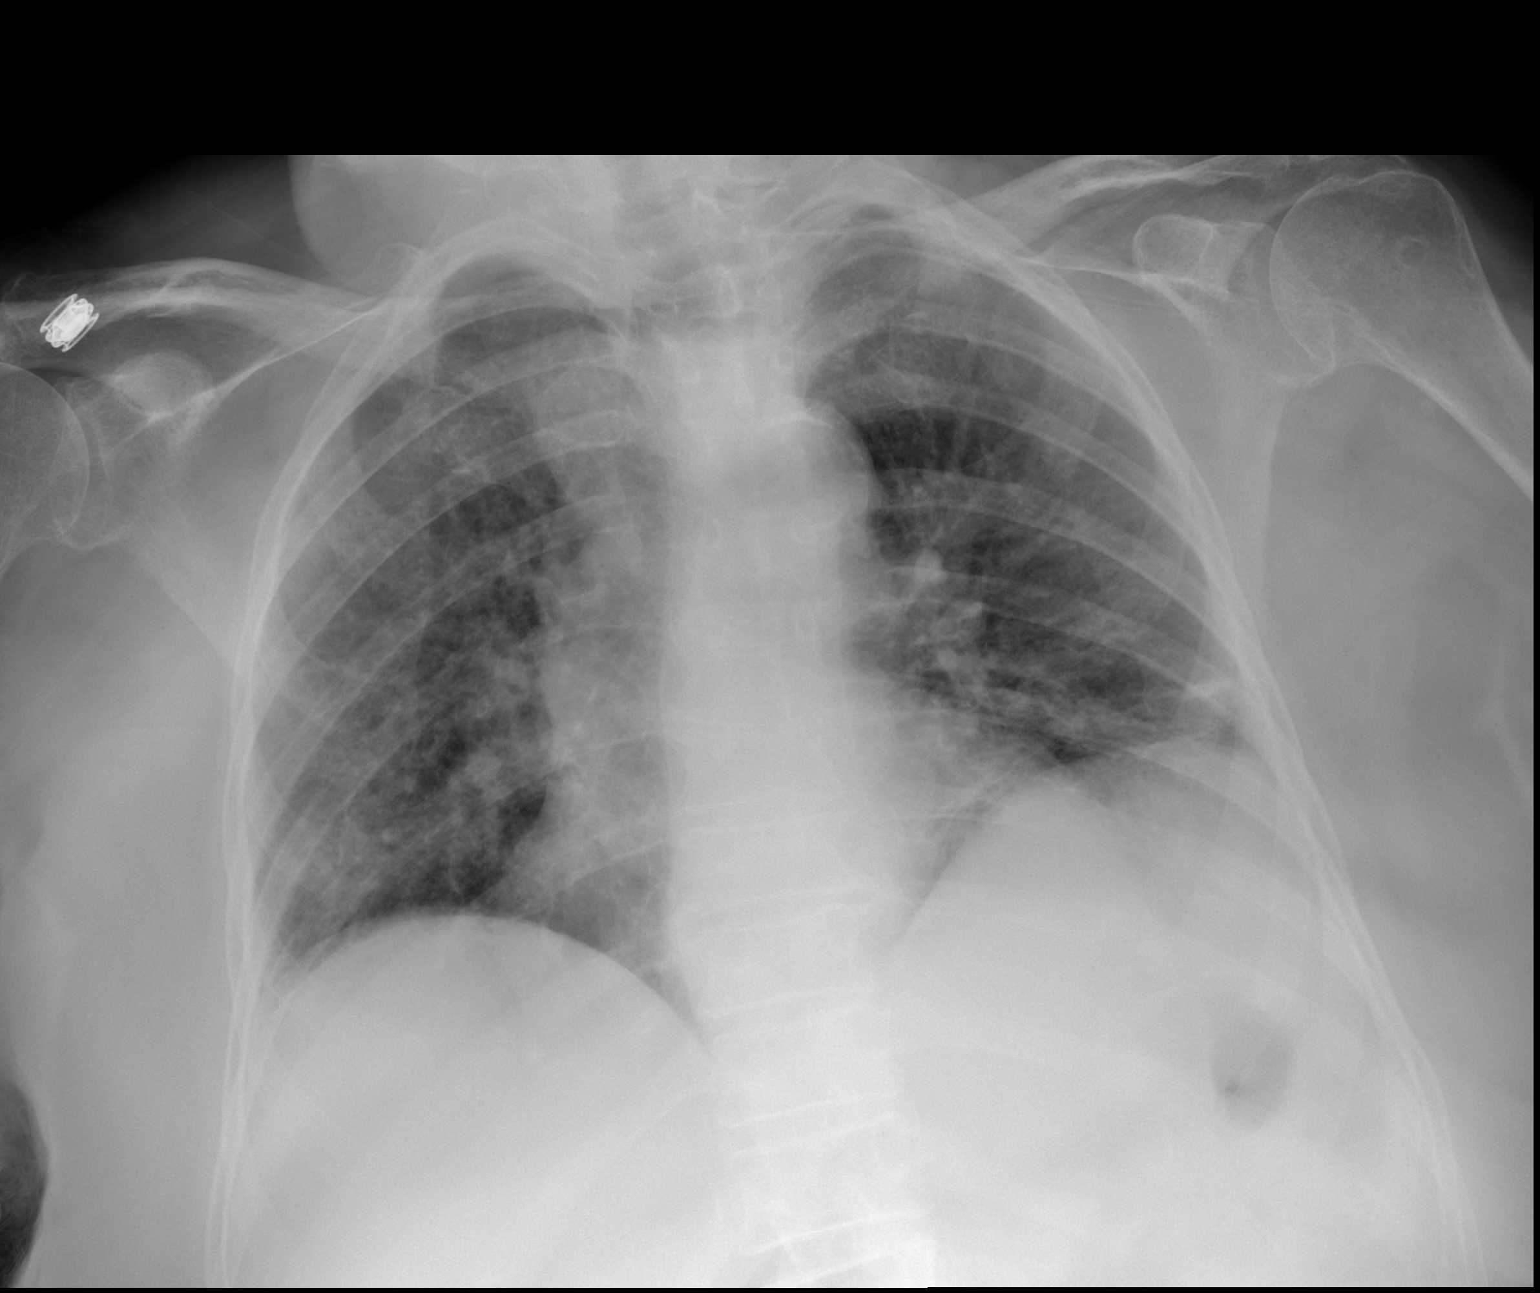

[1 of 1 positions shown; findings below may reference images not displayed]

FINDINGS: Unchanged cardiomediastinal silhouette. Stable chronic interstitial
prominence. Mild streaky left basilar opacities, likely subsegmental
atelectasis. No large pleural effusion. No visible pneumothorax. No
acute osseous abnormality on single frontal view of the chest.
IMPRESSION: Left basilar subsegmental atelectasis. Stable chronic interstitial
prominence and pulmonary scarring.

## 2022-08-09 IMAGING — CR DG SHOULDER 2+V*R*
2 series · 2 of 2 positions shown · non-contrast
Comparison: None.

CLINICAL DATA: Fell on 03/13/2021. Persistent pain in multiple
locations.

EXAM:
RIGHT SHOULDER - 2+ VIEW

[t scapula y-view right]
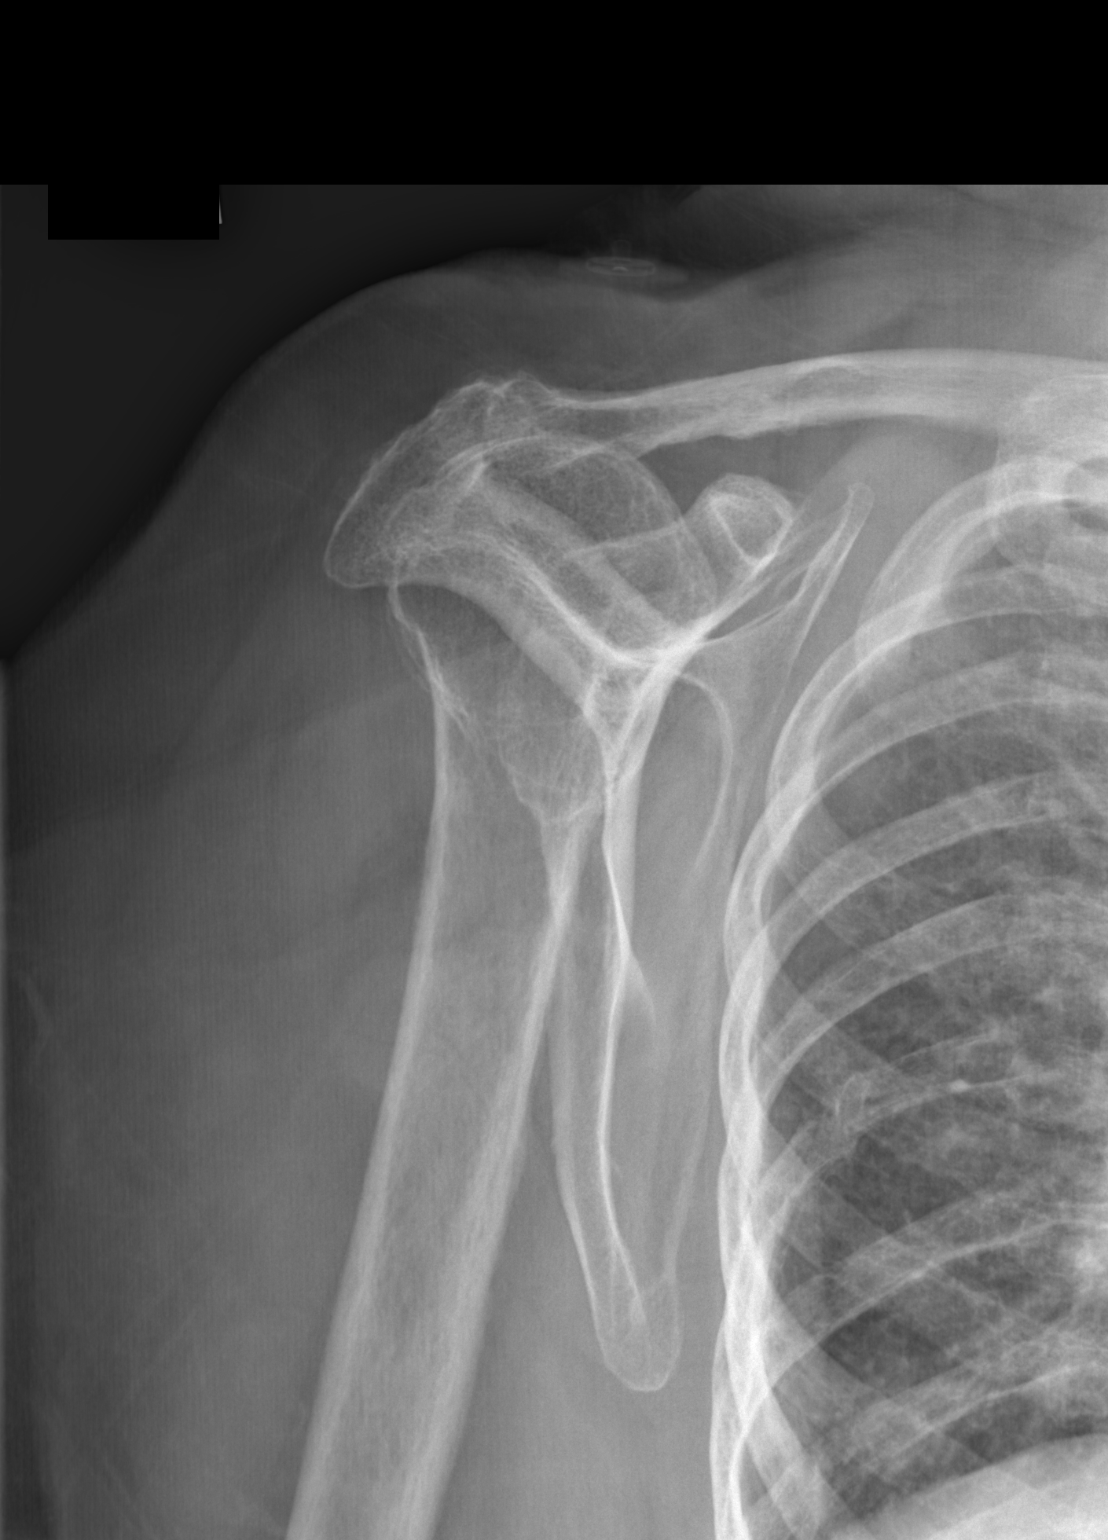

[t shoulder external right]
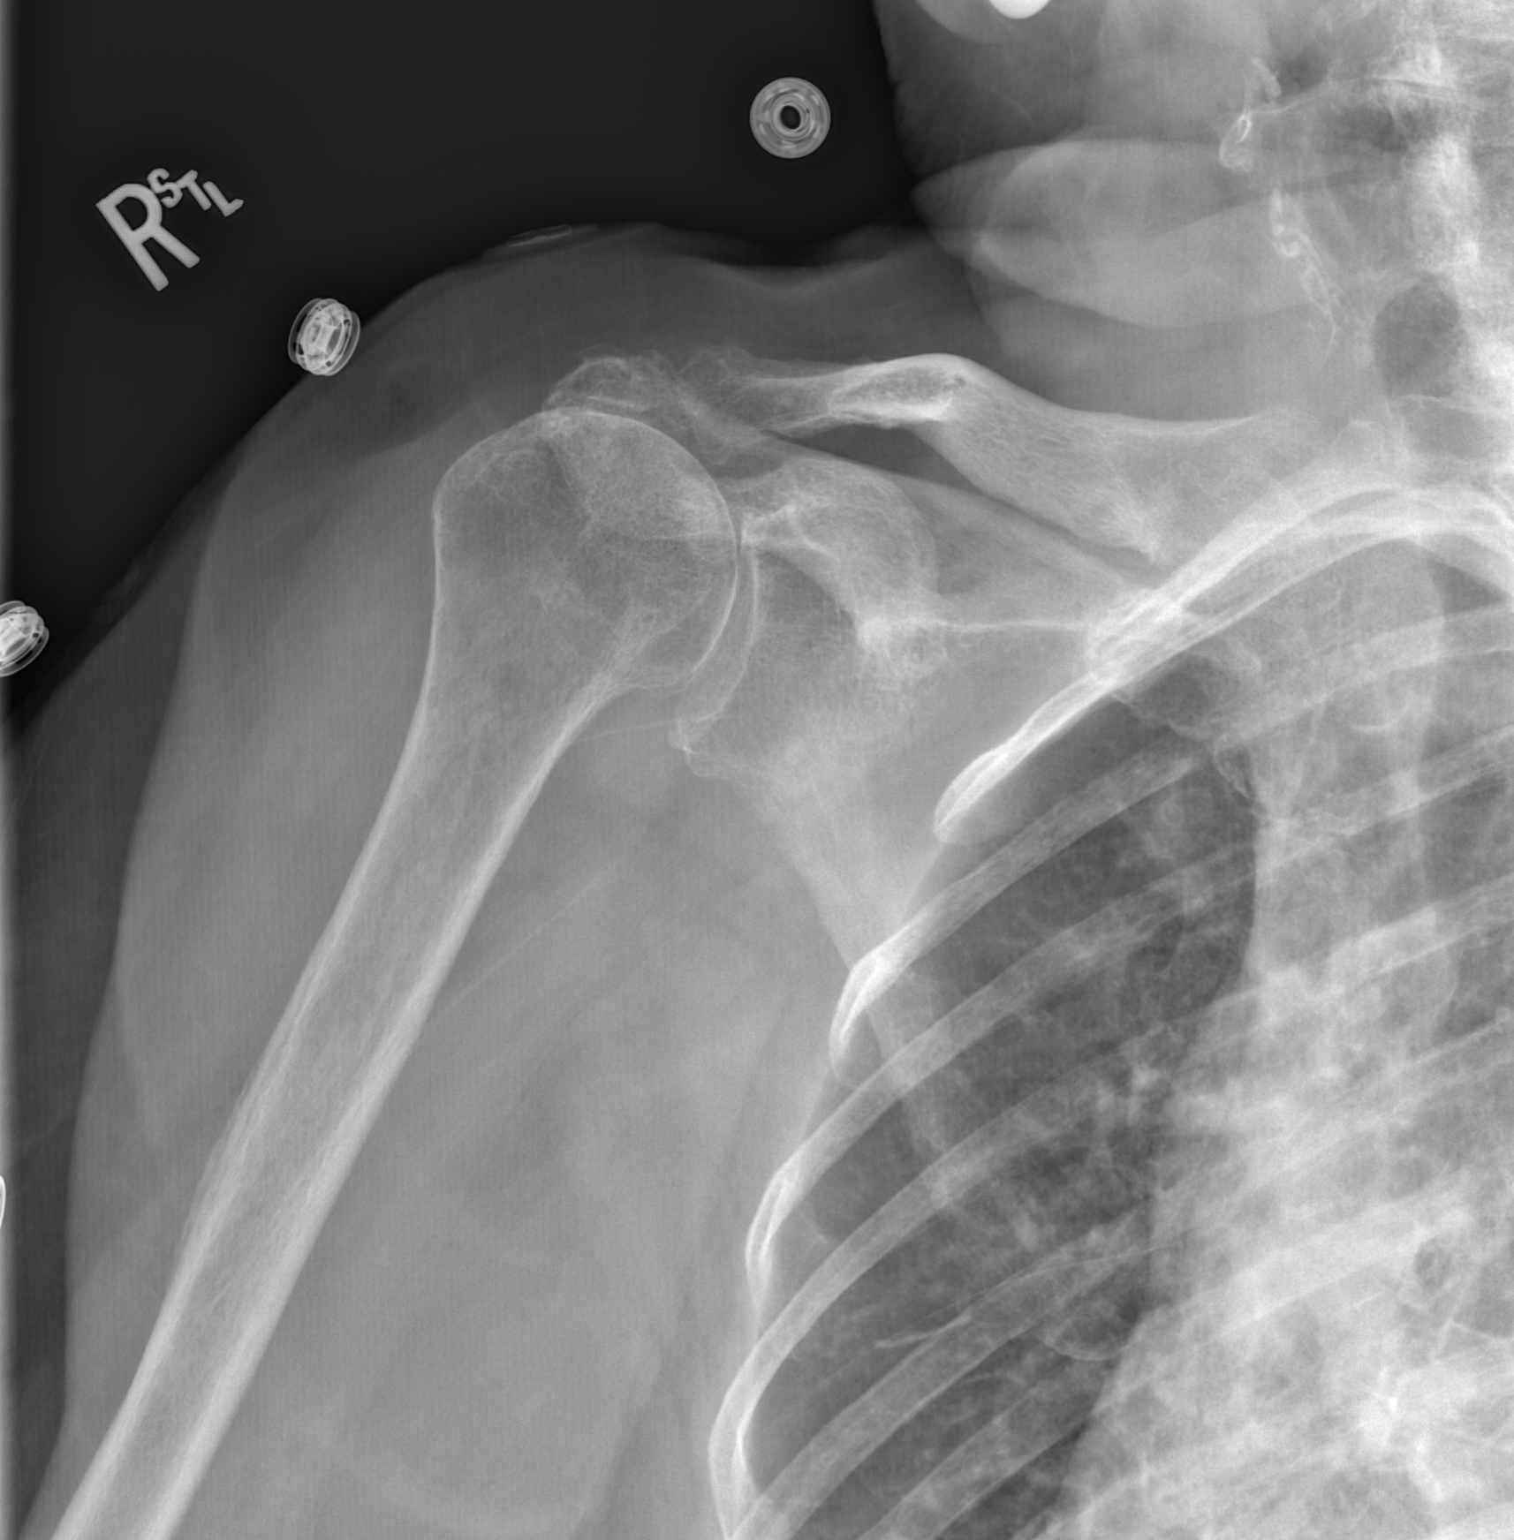

[2 of 2 positions shown; findings below may reference images not displayed]

FINDINGS: No fracture or bone lesion.

Glenohumeral joint normally aligned. Mild AC joint osteoarthritis.
AC joint normally aligned.

Significant narrowing of the subacromial space consistent with a
chronic full-thickness rotator cuff tear.

Skeletal structures are demineralized.

Soft tissues are unremarkable.
IMPRESSION: 1. No fracture or dislocation.  No acute finding.
